# Patient Record
Sex: Female | Born: 1970 | Race: Black or African American | Hispanic: No | Marital: Married | Smoking: Never smoker
Health system: Southern US, Community
[De-identification: ages and names within clinical notes are randomized; demographics above are authoritative.]

## PROBLEM LIST (undated history)

## (undated) DIAGNOSIS — F329 Major depressive disorder, single episode, unspecified: Secondary | ICD-10-CM

## (undated) DIAGNOSIS — R7303 Prediabetes: Secondary | ICD-10-CM

## (undated) DIAGNOSIS — Z9221 Personal history of antineoplastic chemotherapy: Secondary | ICD-10-CM

## (undated) DIAGNOSIS — Z87442 Personal history of urinary calculi: Secondary | ICD-10-CM

## (undated) DIAGNOSIS — F32A Depression, unspecified: Secondary | ICD-10-CM

## (undated) DIAGNOSIS — C50919 Malignant neoplasm of unspecified site of unspecified female breast: Secondary | ICD-10-CM

## (undated) DIAGNOSIS — K760 Fatty (change of) liver, not elsewhere classified: Secondary | ICD-10-CM

## (undated) DIAGNOSIS — Z973 Presence of spectacles and contact lenses: Secondary | ICD-10-CM

## (undated) DIAGNOSIS — D649 Anemia, unspecified: Secondary | ICD-10-CM

## (undated) DIAGNOSIS — K219 Gastro-esophageal reflux disease without esophagitis: Secondary | ICD-10-CM

## (undated) DIAGNOSIS — I10 Essential (primary) hypertension: Secondary | ICD-10-CM

## (undated) DIAGNOSIS — Z923 Personal history of irradiation: Secondary | ICD-10-CM

## (undated) DIAGNOSIS — E7800 Pure hypercholesterolemia, unspecified: Secondary | ICD-10-CM

## (undated) DIAGNOSIS — F419 Anxiety disorder, unspecified: Secondary | ICD-10-CM

## (undated) DIAGNOSIS — G620 Drug-induced polyneuropathy: Secondary | ICD-10-CM

## (undated) DIAGNOSIS — N200 Calculus of kidney: Secondary | ICD-10-CM

## (undated) HISTORY — DX: Malignant neoplasm of unspecified site of unspecified female breast: C50.919

## (undated) HISTORY — DX: Gastro-esophageal reflux disease without esophagitis: K21.9

## (undated) HISTORY — DX: Prediabetes: R73.03

## (undated) HISTORY — PX: OOPHORECTOMY: SHX86

## (undated) HISTORY — DX: Pure hypercholesterolemia, unspecified: E78.00

## (undated) HISTORY — DX: Fatty (change of) liver, not elsewhere classified: K76.0

## (undated) HISTORY — PX: WRIST SURGERY: SHX841

## (undated) HISTORY — DX: Essential (primary) hypertension: I10

---

## 1997-07-27 HISTORY — PX: TUBAL LIGATION: SHX77

## 1997-11-14 ENCOUNTER — Inpatient Hospital Stay (HOSPITAL_COMMUNITY): Admission: AD | Admit: 1997-11-14 | Discharge: 1997-11-17 | Payer: Self-pay | Admitting: Obstetrics and Gynecology

## 1998-10-31 ENCOUNTER — Emergency Department (HOSPITAL_COMMUNITY): Admission: EM | Admit: 1998-10-31 | Discharge: 1998-10-31 | Payer: Self-pay | Admitting: Emergency Medicine

## 2000-07-27 HISTORY — PX: CHOLECYSTECTOMY: SHX55

## 2000-09-14 ENCOUNTER — Other Ambulatory Visit: Admission: RE | Admit: 2000-09-14 | Discharge: 2000-09-14 | Payer: Self-pay | Admitting: Internal Medicine

## 2002-03-10 ENCOUNTER — Ambulatory Visit (HOSPITAL_COMMUNITY): Admission: RE | Admit: 2002-03-10 | Discharge: 2002-03-10 | Payer: Self-pay | Admitting: Internal Medicine

## 2002-03-10 ENCOUNTER — Encounter: Payer: Self-pay | Admitting: Internal Medicine

## 2002-03-16 ENCOUNTER — Ambulatory Visit (HOSPITAL_COMMUNITY): Admission: RE | Admit: 2002-03-16 | Discharge: 2002-03-16 | Payer: Self-pay | Admitting: Internal Medicine

## 2002-03-16 ENCOUNTER — Encounter: Payer: Self-pay | Admitting: Internal Medicine

## 2002-03-21 ENCOUNTER — Encounter: Payer: Self-pay | Admitting: Internal Medicine

## 2002-03-21 ENCOUNTER — Ambulatory Visit (HOSPITAL_COMMUNITY): Admission: RE | Admit: 2002-03-21 | Discharge: 2002-03-21 | Payer: Self-pay | Admitting: Internal Medicine

## 2002-03-21 ENCOUNTER — Emergency Department (HOSPITAL_COMMUNITY): Admission: EM | Admit: 2002-03-21 | Discharge: 2002-03-21 | Payer: Self-pay | Admitting: Emergency Medicine

## 2002-06-23 ENCOUNTER — Encounter (HOSPITAL_BASED_OUTPATIENT_CLINIC_OR_DEPARTMENT_OTHER): Payer: Self-pay | Admitting: General Surgery

## 2002-06-27 ENCOUNTER — Encounter (INDEPENDENT_AMBULATORY_CARE_PROVIDER_SITE_OTHER): Payer: Self-pay | Admitting: *Deleted

## 2002-06-27 ENCOUNTER — Ambulatory Visit (HOSPITAL_COMMUNITY): Admission: RE | Admit: 2002-06-27 | Discharge: 2002-06-28 | Payer: Self-pay | Admitting: General Surgery

## 2002-06-27 ENCOUNTER — Encounter (HOSPITAL_BASED_OUTPATIENT_CLINIC_OR_DEPARTMENT_OTHER): Payer: Self-pay | Admitting: General Surgery

## 2002-06-27 HISTORY — PX: CHOLECYSTECTOMY, LAPAROSCOPIC: SHX56

## 2003-09-03 ENCOUNTER — Emergency Department (HOSPITAL_COMMUNITY): Admission: EM | Admit: 2003-09-03 | Discharge: 2003-09-03 | Payer: Self-pay | Admitting: Family Medicine

## 2004-09-24 ENCOUNTER — Emergency Department (HOSPITAL_COMMUNITY): Admission: EM | Admit: 2004-09-24 | Discharge: 2004-09-24 | Payer: Self-pay | Admitting: Family Medicine

## 2004-10-29 ENCOUNTER — Emergency Department (HOSPITAL_COMMUNITY): Admission: EM | Admit: 2004-10-29 | Discharge: 2004-10-29 | Payer: Self-pay | Admitting: Family Medicine

## 2006-01-21 ENCOUNTER — Emergency Department (HOSPITAL_COMMUNITY): Admission: EM | Admit: 2006-01-21 | Discharge: 2006-01-21 | Payer: Self-pay | Admitting: Family Medicine

## 2006-06-04 ENCOUNTER — Emergency Department (HOSPITAL_COMMUNITY): Admission: EM | Admit: 2006-06-04 | Discharge: 2006-06-04 | Payer: Self-pay | Admitting: Emergency Medicine

## 2007-09-13 ENCOUNTER — Emergency Department (HOSPITAL_COMMUNITY): Admission: EM | Admit: 2007-09-13 | Discharge: 2007-09-13 | Payer: Self-pay | Admitting: Emergency Medicine

## 2007-11-21 ENCOUNTER — Encounter (HOSPITAL_COMMUNITY): Payer: Self-pay | Admitting: Obstetrics and Gynecology

## 2007-11-21 ENCOUNTER — Ambulatory Visit (HOSPITAL_COMMUNITY): Admission: RE | Admit: 2007-11-21 | Discharge: 2007-11-21 | Payer: Self-pay | Admitting: Obstetrics and Gynecology

## 2007-11-21 HISTORY — PX: HYSTEROSCOPY WITH D & C: SHX1775

## 2008-08-13 ENCOUNTER — Inpatient Hospital Stay (HOSPITAL_COMMUNITY): Admission: EM | Admit: 2008-08-13 | Discharge: 2008-08-14 | Payer: Self-pay | Admitting: Emergency Medicine

## 2008-08-17 ENCOUNTER — Encounter (INDEPENDENT_AMBULATORY_CARE_PROVIDER_SITE_OTHER): Payer: Self-pay | Admitting: Orthopedic Surgery

## 2008-08-17 ENCOUNTER — Ambulatory Visit (HOSPITAL_BASED_OUTPATIENT_CLINIC_OR_DEPARTMENT_OTHER): Admission: RE | Admit: 2008-08-17 | Discharge: 2008-08-17 | Payer: Self-pay | Admitting: Orthopedic Surgery

## 2008-08-17 HISTORY — PX: CARPAL TUNNEL RELEASE: SHX101

## 2010-11-10 LAB — POCT I-STAT, CHEM 8
BUN: 16 mg/dL (ref 6–23)
Calcium, Ion: 1.13 mmol/L (ref 1.12–1.32)
Chloride: 109 mEq/L (ref 96–112)
Creatinine, Ser: 0.7 mg/dL (ref 0.4–1.2)
Glucose, Bld: 150 mg/dL — ABNORMAL HIGH (ref 70–99)
HCT: 41 % (ref 36.0–46.0)
Potassium: 3.5 mEq/L (ref 3.5–5.1)

## 2010-11-10 LAB — URINALYSIS, ROUTINE W REFLEX MICROSCOPIC
Glucose, UA: NEGATIVE mg/dL
Hgb urine dipstick: NEGATIVE
Ketones, ur: NEGATIVE mg/dL
Protein, ur: NEGATIVE mg/dL
Urobilinogen, UA: 0.2 mg/dL (ref 0.0–1.0)

## 2010-11-10 LAB — COMPREHENSIVE METABOLIC PANEL
ALT: 16 U/L (ref 0–35)
Alkaline Phosphatase: 49 U/L (ref 39–117)
BUN: 14 mg/dL (ref 6–23)
CO2: 19 mEq/L (ref 19–32)
Chloride: 109 mEq/L (ref 96–112)
Glucose, Bld: 144 mg/dL — ABNORMAL HIGH (ref 70–99)
Potassium: 3.5 mEq/L (ref 3.5–5.1)
Sodium: 139 mEq/L (ref 135–145)
Total Bilirubin: 0.8 mg/dL (ref 0.3–1.2)
Total Protein: 6.9 g/dL (ref 6.0–8.3)

## 2010-12-09 NOTE — Op Note (Signed)
NAMEWILMER, Joanna Osborn                  ACCOUNT NO.:  [id number]   MEDICAL RECORD NO.:  [id number]          PATIENT TYPE:  AMB   LOCATION:  SDC                           FACILITY:  [facility]   PHYSICIAN:  Zelphia Cairo, MD    DATE OF BIRTH:  [birth year]   DATE OF PROCEDURE:  11/21/2007  DATE OF DISCHARGE:                               OPERATIVE REPORT   PREOPERATIVE DIAGNOSES:  1. Menorrhagia  2. Endometrial mass.   POSTOPERATIVE DIAGNOSES:  1. Menorrhagia  2. Endometrial mass, suspect endometrial polyp.   PROCEDURES:  1. Hysteroscopy.  2. Dilation and curettage.  3. Polypectomy.  4. Mirena intrauterine device insertion.   SURGEON:  Zelphia Cairo, MD.   ANESTHESIA:  General.   SPECIMEN:  Endometrial curettings and endometrial polyp to pathology.   ESTIMATED BLOOD LOSS:  Minimal.   COMPLICATIONS:  None.   CONDITION:  Stable and extubated to recovery room.   PROCEDURE:  Joanna Osborn was taken to the operating room and placed in the  supine position.  General anesthesia was easily obtained.  She was then  placed in the dorsal lithotomy position and prepped and draped in  sterile fashion.  An in-and-out catheter was used to drain her bladder  for approximately 20 mL of clear urine.  Bivalve speculum was then  placed in the vagina and a single-tooth tenaculum placed on the anterior  lip of the cervix.  The cervix was dilated using Pratt dilators.  The  uterus sounded to 7.5 cm.  The diagnostic hysteroscope was inserted into  the uterine cavity and a survey was performed.  Endometrial polyp was  noted on the anterior right aspect of the uterine wall.  The remaining  endometrial cavity appeared normal visually.  The hysteroscope was then  removed from the cavity.  Polyp forceps were introduced and the  endometrial polyp was grasped and easily removed.  A gentle curetting  was then performed using a curette.  Hysteroscope was reintroduced to  ensure that the endometrial mass was removed.   The cavity appeared  without polyps or masses at this point.  Hysteroscope was removed and  the Mirena IUD was  inserted using standard manufacture guidelines. Strings were cut  approximately 2 cm from the cervical os.  The speculum and single-tooth  tenaculum were then removed.  The patient was extubated and taken to the  recovery room in stable condition.  Sponge, lap, needle and instrument  counts were correct x2.      Zelphia Cairo, MD  Electronically Signed     GA/MEDQ  D:  11/21/2007  T:  11/22/2007  Job:  [id number]

## 2010-12-09 NOTE — Op Note (Signed)
Joanna Osborn, Joanna Osborn                  ACCOUNT NO.:  [id number]   MEDICAL RECORD NO.:  [id number]          PATIENT TYPE:  AMB   LOCATION:  DSC                          FACILITY:  [facility]   PHYSICIAN:  Cindee Salt, M.D.       DATE OF BIRTH:  [birth year]   DATE OF PROCEDURE:  08/17/2008  DATE OF DISCHARGE:                               OPERATIVE REPORT   PREOPERATIVE DIAGNOSIS:  Carpal tunnel syndrome with cyst in the carpal  canal.   POSTOPERATIVE DIAGNOSIS:  Carpal tunnel syndrome with cyst in the carpal  canal.   OPERATION:  Excision of volar radial wrist ganglion and release of right  carpal tunnel.   SURGEON:  Cindee Salt, MD   ASSISTANT:  Carolyne Fiscal, RN   ANESTHESIA:  General.   DATE OF OPERATION:  August 17, 2008.   ANESTHESIOLOGIST:  Zenon Mayo, MD   HISTORY:  The patient is a 40 year old female with a history of a mass  on the volar aspect of her hand and carpal tunnel syndrome.  MRI reveals  a cyst in the carpal canal.  This has not responded to conservative  treatment.  She has elected to proceed to have this surgically released  and excised.  She is aware of risks and complications including  infection, recurrence, injury to arteries, nerves, and tendons,  incomplete relief of symptoms, and dystrophy.  In the preoperative area,  the patient is seen.  The extremity marked by both the patient and  surgeon.  Antibiotic given.   PROCEDURE:  The patient was brought to the operating room where a  general anesthetic was carried out without difficulty under the  direction of Dr. Sampson Goon.  She was prepped using DuraPrep, supine  position with the right arm free.  The limb was exsanguinated after time-  out was taken confirming the patient and procedure.  Tourniquet was  placed high on the arm and was inflated to 250 mmHg after exsanguination  with an Esmarch bandage.  A longitudinal incision was made in the palm  and carried down through subcutaneous tissue.  Bleeders  were  electrocauterized.  Palmar fascia was split.  Superficial palmar arch  identified.  Flexor tendon to the ring and little finger identified to  the ulnar side of median nerve.  Carpal retinaculum was incised with  sharp dissection.  A right-angle and Sewall retractor were placed  between skin and forearm fascia.  The visualization of the canal was  less than perfect.  The cyst was not identified.  The wound was then  extended proximally, first to the ulnar side and then onto the distal  forearm and carried down through subcutaneous tissue.  The carpal canal  was released.  Area of compression of the nerve was apparent.  Cyst was  not immediately identified.  This was found superficial to the nerve  inside the carpal retinaculum and then palmarly.  With blunt and sharp  dissection, this was dissected free and followed down to the STT joint.  Area was debrided.  The wound was copiously irrigated with saline.  The  specimen was sent to pathology.  No further lesions were identified.  The wound was then closed with interrupted 5-0 Vicryl  Rapide sutures.  A sterile compressive dressing and splint to the wrist  was applied.  The patient tolerated the procedure well and was taken to  the recovery room for observation in satisfactory condition.  She will  be discharged home to return to [facility] in 1 week on  Vicodin.           ______________________________  Cindee Salt, M.D.     GK/MEDQ  D:  08/17/2008  T:  08/18/2008  Job:  [id number]   cc:   Tanya D. Daphine Deutscher, M.D.

## 2010-12-12 NOTE — Op Note (Signed)
NAME:  Joanna Osborn, Joanna Osborn NO.:  [id number]   MEDICAL RECORD NO.:  [id number]                   PATIENT TYPE:  OIB   LOCATION:  2550                                 FACILITY:  [facility]   PHYSICIAN:  Leonie Man, M.D.                DATE OF BIRTH:  May 02, 1971   DATE OF PROCEDURE:  06/27/2002  DATE OF DISCHARGE:                                 OPERATIVE REPORT   PREOPERATIVE DIAGNOSIS:  Chronic cholecystitis.   POSTOPERATIVE DIAGNOSIS:  Chronic cholecystitis.   PROCEDURE:  Laparoscopic cholecystectomy with intraoperative cholangiogram.   SURGEON:  Leonie Man, M.D.   ASSISTANT:  Joanne Gavel, M.D.   ANESTHESIA:  General.   CLINICAL NOTE:  The patient is a 40 year old woman with recurrent upper  abdominal pain, associated nausea and vomiting, who presents now for  laparoscopic cholecystectomy after abdominal ultrasound has demonstrated  cholelithiasis.  She comes to the operating room after the risks and  benefits of surgery have been fully discussed.  She also had a HIDA scan,  which showed her gallbladder function to be normal with an ejection fraction  of 69% at 30 minutes.  She was referred for cholecystectomy because of her  persistent classic symptoms of chronic cholecystitis.   DESCRIPTION OF PROCEDURE:  Following the induction of satisfactory general  anesthesia with the patient positioned supinely, the abdomen is prepped and  draped routinely.  Open laparoscopy created at the umbilicus with  insufflation of the peritoneal cavity to 14 mmHg pressure, insertion of a  camera, and with visual exploration carried out.  The visualization showed a  great deal of scarring around the region of the gallbladder.  Liver edges  were sharp and liver surfaces smooth.  Anterior gastric wall appeared  normal.  The uterus was enlarged to approximately two months' size.  Under  direct vision epigastric and lateral ports were placed.  The gallbladder was  then  grasped and dissection carried down, removing multiple adhesions from  the gallbladder, carrying the dissection down to a very long cystic duct.  This was cleared.  The cystic duct cholangiogram was then carried out  through this cystic duct, showing free flow of contrast into the duodenum  with no filling defects in any portion of the intrahepatic or extrahepatic  biliary system visualized.  Angiocath was removed and the cystic duct was  triply clipped and transected.  The cystic artery was also doubly clipped  and transected.  Then the gallbladder was dissected free from the liver bed,  maintaining hemostasis throughout the course of the dissection.  Following  removal of the gallbladder, the liver bed was again inspected for bleeding.  All additional bleeding points treated with electrocautery and the right  upper quadrant thoroughly irrigated with normal saline.  The camera was then  moved to the epigastric port.  The gallbladder was retrieved through the  umbilical port without difficulty.  The pneumoperitoneum was deflated.  Sponge, instrument, and sharp counts were verified, and the wound was closed  in layers, the umbilical wound being  closed in two layers with 0 Dexon and 4-0 Vicryl, epigastric and lateral  flank wounds closed with 4-0 Vicryl sutures, all wounds reinforced with  Steri-Strips, and a sterile dressing was applied.  Anesthetic reversed.  The  patient removed from the operating room to the recovery room in stable  condition.  She tolerated the procedure well.                                               Leonie Man, M.D.    PB/MEDQ  D:  06/27/2002  T:  06/27/2002  Job:  [id number]

## 2011-04-21 LAB — CBC
Hemoglobin: 12.3
MCHC: 33.6
MCV: 86.2
RBC: 4.26
RDW: 13.4

## 2011-12-14 ENCOUNTER — Encounter (HOSPITAL_COMMUNITY): Payer: Self-pay | Admitting: Cardiology

## 2011-12-14 ENCOUNTER — Emergency Department (HOSPITAL_COMMUNITY)
Admission: EM | Admit: 2011-12-14 | Discharge: 2011-12-14 | Disposition: A | Discharge status: Home / Self Care | Payer: Federal, State, Local not specified - PPO | Source: Home / Self Care | Attending: Emergency Medicine | Admitting: Emergency Medicine

## 2011-12-14 DIAGNOSIS — J020 Streptococcal pharyngitis: Secondary | ICD-10-CM

## 2011-12-14 MED ORDER — AMOXICILLIN 500 MG PO CAPS: 500.0000 mg | ORAL_CAPSULE | Freq: Three times a day (TID) | ORAL | Status: AC

## 2011-12-14 MED ORDER — LORAZEPAM 2 MG/ML IJ SOLN
INTRAMUSCULAR | Status: AC
  Filled 2011-12-14: qty 1

## 2011-12-14 MED ORDER — ACETAMINOPHEN-CODEINE #3 300-30 MG PO TABS: 1.0000 | ORAL_TABLET | Freq: Four times a day (QID) | ORAL | Status: AC | PRN

## 2011-12-14 NOTE — ED Notes (Signed)
Pt reports left sided sore throat, nasal congestion, facial pain, and bilat ear pain that started yesterday. Denies fever. Decreased PO intake today due to pain on swallowing.

## 2011-12-14 NOTE — ED Provider Notes (Signed)
History     CSN: [id number]  Arrival date & time 12/14/11  0911   First MD Initiated Contact with Patient 12/14/11 1017      Chief Complaint  Patient presents with  . Sore Throat  . Nasal Congestion  . Facial Pain  . Otalgia    (Consider location/radiation/quality/duration/timing/severity/associated sxs/prior treatment) HPI Comments: Patient woke up this morning with left-sided sore throat, and sinus congestion (patient points to both maxillary and frontal sinuses) "it really hurts when I swallow mainly on my left side and left side of my neck both ears are throbbing and hurting. Patient denies any cough, nasal congestion runny nose postnasal dripping or fevers.  Patient is a 41 y.o. female presenting with pharyngitis and ear pain.  Sore Throat The current episode started yesterday. The problem occurs constantly. The problem has been gradually worsening. Pertinent negatives include no shortness of breath. The symptoms are aggravated by swallowing. The symptoms are relieved by nothing. She has tried nothing for the symptoms.  Otalgia Associated symptoms include sore throat. Pertinent negatives include no rhinorrhea and no cough.    History reviewed. No pertinent past medical history.  Past Surgical History  Procedure Date  . Tubal ligation 1999  . Cholecystectomy 2002    No family history on file.  History  Substance Use Topics  . Smoking status: Never Smoker   . Smokeless tobacco: Not on file  . Alcohol Use: Yes     occas    OB History    Grav Para Term Preterm Abortions TAB SAB Ect Mult Living                  Review of Systems  Constitutional: Positive for appetite change. Negative for fever, chills and fatigue.  HENT: Positive for ear pain, sore throat and sinus pressure. Negative for congestion, rhinorrhea, trouble swallowing and postnasal drip.   Respiratory: Negative for cough and shortness of breath.     Allergies  Review of patient's allergies  indicates no known allergies.  Home Medications  No current outpatient prescriptions on file.  BP 137/89  Pulse 104  Temp(Src) 99.8 F (37.7 C) (Oral)  Resp 18  SpO2 99%  LMP 11/14/2011  Physical Exam  Nursing note and vitals reviewed. Constitutional: She appears well-developed and well-nourished.  HENT:  Right Ear: Tympanic membrane normal.  Left Ear: Tympanic membrane normal.  Mouth/Throat: Uvula is midline and mucous membranes are normal. Posterior oropharyngeal erythema present. No oropharyngeal exudate.  Eyes: Conjunctivae are normal.  Neck: No JVD present. No tracheal deviation present.  Pulmonary/Chest: Effort normal and breath sounds normal. No respiratory distress.  Lymphadenopathy:    She has cervical adenopathy.  Skin: No rash noted.    ED Course  Procedures (including critical care time)  Labs Reviewed  POCT RAPID STREP A ([facility] URG CARE ONLY) - Abnormal; Notable for the following:    Streptococcus, Group A Screen (Direct) POSITIVE (*)    All other components within normal limits   No results found.   No diagnosis found.    MDM  Uncomplicated streptococcal pharyngitis        Jimmie Molly, MD 12/14/11 1058

## 2011-12-14 NOTE — Discharge Instructions (Signed)
You should feel much better and 48 hours. Try to stay well hydrated. If worsening symptoms despite antibiotics or unable to swallow fluids or excessive salivation should go to the emergency department.  Strep Throat Strep throat is an infection of the throat caused by a bacteria named Streptococcus pyogenes. Your caregiver may call the infection streptococcal "tonsillitis" or "pharyngitis" depending on whether there are signs of inflammation in the tonsils or back of the throat. Strep throat is most common in children from 82 to 41 years old during the cold months of the year, but it can occur in people of any age during any season. This infection is spread from person to person (contagious) through coughing, sneezing, or other close contact. SYMPTOMS   Fever or chills.   Painful, swollen, red tonsils or throat.   Pain or difficulty when swallowing.   White or yellow spots on the tonsils or throat.   Swollen, tender lymph nodes or "glands" of the neck or under the jaw.   Red rash all over the body (rare).  DIAGNOSIS  Many different infections can cause the same symptoms. A test must be done to confirm the diagnosis so the right treatment can be given. A "rapid strep test" can help your caregiver make the diagnosis in a few minutes. If this test is not available, a light swab of the infected area can be used for a throat culture test. If a throat culture test is done, results are usually available in a day or two. TREATMENT  Strep throat is treated with antibiotic medicine. HOME CARE INSTRUCTIONS   Gargle with 1 tsp of salt in 1 cup of warm water, 3 to 4 times per day or as needed for comfort.   Family members who also have a sore throat or fever should be tested for strep throat and treated with antibiotics if they have the strep infection.   Make sure everyone in your household washes their hands well.   Do not share food, drinking cups, or personal items that could cause the infection  to spread to others.   You may need to eat a soft food diet until your sore throat gets better.   Drink enough water and fluids to keep your urine clear or pale yellow. This will help prevent dehydration.   Get plenty of rest.   Stay home from school, daycare, or work until you have been on antibiotics for 24 hours.   Only take over-the-counter or prescription medicines for pain, discomfort, or fever as directed by your caregiver.   If antibiotics are prescribed, take them as directed. Finish them even if you start to feel better.  SEEK MEDICAL CARE IF:   The glands in your neck continue to enlarge.   You develop a rash, cough, or earache.   You cough up green, yellow-brown, or bloody sputum.   You have pain or discomfort not controlled by medicines.   Your problems seem to be getting worse rather than better.  SEEK IMMEDIATE MEDICAL CARE IF:   You develop any new symptoms such as vomiting, severe headache, stiff or painful neck, chest pain, shortness of breath, or trouble swallowing.   You develop severe throat pain, drooling, or changes in your voice.   You develop swelling of the neck, or the skin on the neck becomes red and tender.   You have a fever.   You develop signs of dehydration, such as fatigue, dry mouth, and decreased urination.   You become increasingly sleepy,  or you cannot wake up completely.  Document Released: 07/10/2000 Document Revised: 07/02/2011 Document Reviewed: 09/11/2010 [facility] Patient Information 2012 [place], [place].Strep Throat Strep throat is an infection of the throat caused by a bacteria named Streptococcus pyogenes. Your caregiver may call the infection streptococcal "tonsillitis" or "pharyngitis" depending on whether there are signs of inflammation in the tonsils or back of the throat. Strep throat is most common in children from 74 to 90 years old during the cold months of the year, but it can occur in people of any age during any season.  This infection is spread from person to person (contagious) through coughing, sneezing, or other close contact. SYMPTOMS   Fever or chills.   Painful, swollen, red tonsils or throat.   Pain or difficulty when swallowing.   White or yellow spots on the tonsils or throat.   Swollen, tender lymph nodes or "glands" of the neck or under the jaw.   Red rash all over the body (rare).  DIAGNOSIS  Many different infections can cause the same symptoms. A test must be done to confirm the diagnosis so the right treatment can be given. A "rapid strep test" can help your caregiver make the diagnosis in a few minutes. If this test is not available, a light swab of the infected area can be used for a throat culture test. If a throat culture test is done, results are usually available in a day or two. TREATMENT  Strep throat is treated with antibiotic medicine. HOME CARE INSTRUCTIONS   Gargle with 1 tsp of salt in 1 cup of warm water, 3 to 4 times per day or as needed for comfort.   Family members who also have a sore throat or fever should be tested for strep throat and treated with antibiotics if they have the strep infection.   Make sure everyone in your household washes their hands well.   Do not share food, drinking cups, or personal items that could cause the infection to spread to others.   You may need to eat a soft food diet until your sore throat gets better.   Drink enough water and fluids to keep your urine clear or pale yellow. This will help prevent dehydration.   Get plenty of rest.   Stay home from school, daycare, or work until you have been on antibiotics for 24 hours.   Only take over-the-counter or prescription medicines for pain, discomfort, or fever as directed by your caregiver.   If antibiotics are prescribed, take them as directed. Finish them even if you start to feel better.  SEEK MEDICAL CARE IF:   The glands in your neck continue to enlarge.   You develop a  rash, cough, or earache.   You cough up green, yellow-brown, or bloody sputum.   You have pain or discomfort not controlled by medicines.   Your problems seem to be getting worse rather than better.  SEEK IMMEDIATE MEDICAL CARE IF:   You develop any new symptoms such as vomiting, severe headache, stiff or painful neck, chest pain, shortness of breath, or trouble swallowing.   You develop severe throat pain, drooling, or changes in your voice.   You develop swelling of the neck, or the skin on the neck becomes red and tender.   You have a fever.   You develop signs of dehydration, such as fatigue, dry mouth, and decreased urination.   You become increasingly sleepy, or you cannot wake up completely.  Document Released: 07/10/2000 Document  Revised: 07/02/2011 Document Reviewed: 09/11/2010 [facility] Patient Information 2012 [place], [place].

## 2012-04-11 ENCOUNTER — Emergency Department (INDEPENDENT_AMBULATORY_CARE_PROVIDER_SITE_OTHER)
Admission: EM | Admit: 2012-04-11 | Discharge: 2012-04-11 | Disposition: A | Discharge status: Home / Self Care | Payer: Federal, State, Local not specified - PPO | Source: Home / Self Care | Attending: Family Medicine | Admitting: Family Medicine

## 2012-04-11 ENCOUNTER — Encounter (HOSPITAL_COMMUNITY): Payer: Self-pay

## 2012-04-11 DIAGNOSIS — J329 Chronic sinusitis, unspecified: Secondary | ICD-10-CM

## 2012-04-11 MED ORDER — AZITHROMYCIN 250 MG PO TABS: ORAL_TABLET | ORAL | Status: DC

## 2012-04-11 MED ORDER — SALINE NASAL SPRAY 0.65 % NA SOLN: 1.0000 | NASAL | Status: DC | PRN

## 2012-04-11 NOTE — ED Notes (Signed)
C/o pain , pressure in face, sinus pressure; yellow/green nasal secretions, hist of prior sinus issues

## 2012-04-11 NOTE — ED Provider Notes (Signed)
History     CSN: [id number]  Arrival date & time 04/11/12  1046   None     Chief Complaint  Patient presents with  . Facial Pain    (Consider location/radiation/quality/duration/timing/severity/associated sxs/prior treatment) The history is provided by the patient.   Joanna Osborn is a 41 y.o. female who complains of onset of facial pain 3 days ago not relieved with alka seltzer or sudafed.    No sore throat No cough, non productive No pleuritic pain No wheezing + nasal congestion + post-nasal drainage + sinus pain/pressure No voice changes No chest congestion No itchy/red eyes + earache No hemoptysis No SOB + chills/sweats No fever + nausea No vomiting No abdominal pain No diarrhea No skin rashes + fatigue No myalgias No headache  No ill contacts  History reviewed. No pertinent past medical history.  Past Surgical History  Procedure Date  . Tubal ligation 1999  . Cholecystectomy 2002    History reviewed. No pertinent family history.  History  Substance Use Topics  . Smoking status: Never Smoker   . Smokeless tobacco: Not on file  . Alcohol Use: Yes     occas    OB History    Grav Para Term Preterm Abortions TAB SAB Ect Mult Living                  Review of Systems  All other systems reviewed and are negative.    Allergies  Review of patient's allergies indicates no known allergies.  Home Medications   Current Outpatient Rx  Name Route Sig Dispense Refill  . AZITHROMYCIN 250 MG PO TABS  Azithromycin 500mg  on day 1, then 250mg  on days 2-4 6 tablet 0  . SALINE NASAL SPRAY 0.65 % NA SOLN Nasal Place 1 spray into the nose as needed for congestion. 30 mL 12    BP 150/107  Pulse 85  Temp 98.8 F (37.1 C) (Oral)  SpO2 97%  Physical Exam  Nursing note and vitals reviewed. Constitutional: She is oriented to person, place, and time. Vital signs are normal. She appears well-developed and well-nourished. She is active and cooperative.  HENT:    Head: Normocephalic.  Right Ear: Hearing, tympanic membrane, external ear and ear canal normal.  Left Ear: Hearing, tympanic membrane, external ear and ear canal normal.  Nose: Right sinus exhibits maxillary sinus tenderness. Right sinus exhibits no frontal sinus tenderness. Left sinus exhibits maxillary sinus tenderness. Left sinus exhibits no frontal sinus tenderness.  Mouth/Throat: Uvula is midline, oropharynx is clear and moist and mucous membranes are normal. No oropharyngeal exudate.  Eyes: Conjunctivae normal and EOM are normal. Pupils are equal, round, and reactive to light. No scleral icterus.  Neck: Trachea normal, normal range of motion and full passive range of motion without pain. Neck supple. Normal carotid pulses, no hepatojugular reflux and no JVD present. No spinous process tenderness and no muscular tenderness present.  Cardiovascular: Normal rate, regular rhythm, normal heart sounds, intact distal pulses and normal pulses.   No murmur heard. Pulmonary/Chest: Effort normal and breath sounds normal. She exhibits no tenderness.  Lymphadenopathy:       Head (right side): No submental, no submandibular, no tonsillar, no preauricular, no posterior auricular and no occipital adenopathy present.       Head (left side): No submental, no submandibular, no tonsillar, no preauricular, no posterior auricular and no occipital adenopathy present.    She has no cervical adenopathy.  Neurological: She is alert and oriented to person, place,  and time. No cranial nerve deficit or sensory deficit.  Skin: Skin is warm and dry.  Psychiatric: She has a normal mood and affect. Her speech is normal and behavior is normal. Judgment and thought content normal. Cognition and memory are normal.    ED Course  Procedures (including critical care time)  Labs Reviewed - No data to display No results found.   1. Sinusitis       MDM  Stop taking Sudafed, follow up with Dr. Alwyn Pea this week  for recheck and further evaluation of your blood pressure-absence of neurological or cardiovascular symptoms.  Increase fluid intake, rest.  Begin Azithromycin.  Begin saline nasal spray.  Antihistamines of your choice (Claritin or Zyrtec).  Tylenol or Motrin for fever/discomfort.  Followup with PCP if not improving 7 to 10 days.         Johnsie Kindred, NP 04/11/12 1424

## 2012-04-22 NOTE — ED Provider Notes (Signed)
Medical screening examination/treatment/procedure(s) were performed by resident physician or non-physician practitioner and as supervising physician I was immediately available for consultation/collaboration.   Barkley Bruns MD.    Linna Hoff, MD 04/22/12 [phone]

## 2012-10-06 ENCOUNTER — Encounter (HOSPITAL_COMMUNITY): Payer: Self-pay | Admitting: Emergency Medicine

## 2012-10-06 ENCOUNTER — Emergency Department (INDEPENDENT_AMBULATORY_CARE_PROVIDER_SITE_OTHER): Payer: Federal, State, Local not specified - PPO

## 2012-10-06 ENCOUNTER — Emergency Department (HOSPITAL_COMMUNITY)
Admission: EM | Admit: 2012-10-06 | Discharge: 2012-10-06 | Disposition: A | Discharge status: Home / Self Care | Payer: Federal, State, Local not specified - PPO | Source: Home / Self Care | Attending: Emergency Medicine | Admitting: Emergency Medicine

## 2012-10-06 DIAGNOSIS — N200 Calculus of kidney: Secondary | ICD-10-CM

## 2012-10-06 HISTORY — DX: Calculus of kidney: N20.0

## 2012-10-06 MED ORDER — HYDROMORPHONE HCL PF 1 MG/ML IJ SOLN
1.0000 mg | Freq: Once | INTRAMUSCULAR | Status: AC
Start: 2012-10-06 — End: 2012-10-06
  Administered 2012-10-06: 1 mg via INTRAMUSCULAR

## 2012-10-06 MED ORDER — ONDANSETRON HCL 4 MG/2ML IJ SOLN
4.0000 mg | Freq: Once | INTRAMUSCULAR | Status: AC
  Administered 2012-10-06: 4 mg via INTRAMUSCULAR

## 2012-10-06 MED ORDER — ONDANSETRON HCL 4 MG/2ML IJ SOLN
INTRAMUSCULAR | Status: AC
  Filled 2012-10-06: qty 2

## 2012-10-06 MED ORDER — CEPHALEXIN 500 MG PO CAPS: 500.0000 mg | ORAL_CAPSULE | Freq: Three times a day (TID) | ORAL | Status: DC

## 2012-10-06 MED ORDER — KETOROLAC TROMETHAMINE 60 MG/2ML IM SOLN
INTRAMUSCULAR | Status: AC
  Filled 2012-10-06: qty 2

## 2012-10-06 MED ORDER — HYDROMORPHONE HCL PF 1 MG/ML IJ SOLN
INTRAMUSCULAR | Status: AC
  Filled 2012-10-06: qty 1

## 2012-10-06 MED ORDER — OXYCODONE-ACETAMINOPHEN 5-325 MG PO TABS: ORAL_TABLET | ORAL | Status: DC

## 2012-10-06 MED ORDER — TAMSULOSIN HCL 0.4 MG PO CAPS: 0.4000 mg | ORAL_CAPSULE | Freq: Every day | ORAL | Status: DC

## 2012-10-06 MED ORDER — KETOROLAC TROMETHAMINE 60 MG/2ML IM SOLN
60.0000 mg | Freq: Once | INTRAMUSCULAR | Status: AC
  Administered 2012-10-06: 60 mg via INTRAMUSCULAR

## 2012-10-06 NOTE — ED Notes (Signed)
Reports left flank pain started yesterday, intermittent.  Today pain is worse, urine has odor, no pain with urination

## 2012-10-06 NOTE — ED Notes (Signed)
Provided dr Lorenz Coaster with lab results on urine.

## 2012-10-06 NOTE — ED Provider Notes (Signed)
Chief Complaint:   Chief Complaint  Patient presents with  . Flank Pain    History of Present Illness:   Joanna Osborn is a 42 year old female who has a two-day history of intermittent left flank pain radiating around towards the left groin. This is a sharp pain rated 4/10 in intensity. It began rather suddenly yesterday, lasted several hours then went away completely. This morning it came back again. She's not had any fever or chills but has felt nauseated. No vomiting, diarrhea, or hematochezia. She denies any urinary symptoms such as dysuria, frequency, urgency, or hematuria. She's had no GYN symptoms. She does have a history of kidney stones on the right side in the past one occurring 4 years ago another about 5 years ago. Both of these were passed spontaneously.  Review of Systems:  Other than noted above, the patient denies any of the following symptoms: General:  No fevers, chills, sweats, aches, or fatigue. GI:  No abdominal pain, back pain, nausea, vomiting, diarrhea, or constipation. GU:  No dysuria, frequency, urgency, hematuria, or incontinence. GYN:  No discharge, itching, vulvar pain or lesions, pelvic pain, or abnormal vaginal bleeding.  PMFSH:  Past medical history, family history, social history, meds, and allergies were reviewed.  Physical Exam:   Vital signs:  BP 126/89  Pulse 81  Temp(Src) 98.2 F (36.8 C) (Oral)  Resp 16  SpO2 100%  LMP 09/22/2012 Gen:  Alert, oriented, in no distress. Lungs:  Clear to auscultation, no wheezes, rales or rhonchi. Heart:  Regular rhythm, no gallop or murmer. Abdomen:  Flat and soft. There was no abdominal pain to palpation.  No guarding, or rebound.  No hepato-splenomegaly or mass.  Bowel sounds were normally active.  No hernia. Back:  No CVA tenderness.  Skin:  Clear, warm and dry.  Other Labs Obtained at Urgent Care Center:  A urinalysis was performed which showed negative glucose, negative bilirubin, trace ketone, specific gravity  1.025, moderate occult blood, pH 5.5, trace protein, negative nitrite, and trace leukocyte. A urine culture was obtained.  Results are pending at this time and we will call about any positive results.  Dg Abd 1 View  10/06/2012  *RADIOLOGY REPORT*  Clinical Data: Left lower quadrant abdominal pain.  Prior history of urinary tract calculi.  ABDOMEN - 1 VIEW  Comparison: CT abdomen and pelvis 08/13/2008.  Findings: Bowel gas pattern unremarkable without evidence of obstruction or significant ileus.  Prominent Reidel lobe of liver, an anatomic variant.  Surgical clips in the right upper quadrant from prior cholecystectomy.  Intrauterine device.  The tiny calculus in the upper pole of the left kidney identified on the prior CT is not visible on the x-ray.  Small phlebolith in the right side of the pelvis.  No visible right-sided opaque urinary tract calculi.  Regional skeleton unremarkable.  IMPRESSION: No acute abdominal abnormality.  No visible urinary tract calculi.   Original Report Authenticated By: Hulan Saas, M.D.    Course in Urgent Care Center:   Given Toradol 60 mg IM, Dilaudid 1 mg IM, and Zofran 4 mg IM.  Assessment: The encounter diagnosis was Kidney stone.   The history is most compatible with a kidney stone and she did have moderate occult blood in her urine. The x-ray didn't show any radiopaque stones, but will treat as for ureteral stone. I suggested followup with a urologist, Dr. Vernie Ammons, if no better by next week.  Plan:   1.  The following meds were prescribed:  Discharge Medication List as of 10/06/2012  2:46 PM    START taking these medications   Details  cephALEXin (KEFLEX) 500 MG capsule Take 1 capsule (500 mg total) by mouth 3 (three) times daily., Starting 10/06/2012, Until Discontinued, Normal    oxyCODONE-acetaminophen (PERCOCET) 5-325 MG per tablet 1 to 2 tablets every 6 hours as needed for pain., Print    tamsulosin (FLOMAX) 0.4 MG CAPS Take 1 capsule (0.4 mg total)  by mouth daily., Starting 10/06/2012, Until Discontinued, Normal       2.  The patient was instructed in symptomatic care and handouts were given. 3.  The patient was told to return if becoming worse in any way, if no better in 3 or 4 days, and given some red flag symptoms such as fever, vomiting, severe pain, or gross blood in urine that would indicate earlier return. 4.  The patient was told to avoid intercourse for 10 days, get extra fluids, and return for a follow up with her primary care doctor at the completion of treatment for a repeat UA and culture.     Reuben Likes, MD 10/06/12 [phone]

## 2012-10-09 LAB — URINE CULTURE: Colony Count: >=100000

## 2012-10-09 NOTE — ED Notes (Signed)
Urine culture: >100,000 colonies Citrobacter Koseri.  Pt. adequately treated with Keflex. Vassie Moselle 10/09/2012

## 2013-05-04 ENCOUNTER — Encounter (HOSPITAL_COMMUNITY): Payer: Self-pay | Admitting: Emergency Medicine

## 2013-05-04 ENCOUNTER — Emergency Department (HOSPITAL_COMMUNITY)
Admission: EM | Admit: 2013-05-04 | Discharge: 2013-05-05 | Disposition: A | Discharge status: Home / Self Care | Payer: Federal, State, Local not specified - PPO | Attending: Emergency Medicine | Admitting: Emergency Medicine

## 2013-05-04 DIAGNOSIS — Z87442 Personal history of urinary calculi: Secondary | ICD-10-CM | POA: Insufficient documentation

## 2013-05-04 DIAGNOSIS — J329 Chronic sinusitis, unspecified: Secondary | ICD-10-CM

## 2013-05-04 MED ORDER — KETOROLAC TROMETHAMINE 30 MG/ML IJ SOLN
30.0000 mg | Freq: Once | INTRAMUSCULAR | Status: AC
  Administered 2013-05-05: 30 mg via INTRAMUSCULAR
  Filled 2013-05-04: qty 1

## 2013-05-04 MED ORDER — PSEUDOEPHEDRINE HCL ER 120 MG PO TB12
120.0000 mg | ORAL_TABLET | Freq: Two times a day (BID) | ORAL | Status: DC
  Administered 2013-05-05: 120 mg via ORAL
  Filled 2013-05-04: qty 1

## 2013-05-04 MED ORDER — PSEUDOEPHEDRINE HCL ER 120 MG PO TB12: 120.0000 mg | ORAL_TABLET | Freq: Two times a day (BID) | ORAL | Status: DC

## 2013-05-04 NOTE — ED Notes (Signed)
Pt states she has a headache and has had it for going on week #3  Pt states she has been taking OTC medication without relief  Pt states sometimes the pain is so bad she feels like she is going to vomit but has not thus far  Pt states the pain is in her forehead area and to the very back of her head

## 2013-05-04 NOTE — ED Provider Notes (Signed)
CSN: [id number]     Arrival date & time 05/04/13  2241 History  This chart was scribed for non-physician practitioner, Earley Favor, working with Olivia Mackie, MD by Clydene Laming, ED Scribe. This patient was seen in room WTR6/WTR6 and the patient's care was started at 11:26 PM.    Chief Complaint  Patient presents with  . Headache    The history is provided by the patient. No language interpreter was used.   HPI Comments: Joanna Osborn is a 42 y.o. female who presents to the Emergency Department complaining of a waxing and waning headache onset three weeks ago with associated sinus pressure. Pt is experiencing increased pressure in the face when bending over. Pt is taking Excedrin because she thought it was streess, then began to take [employer]. She couldn't be seen by pcp because of her schedule.She has a hx of sinus infection. Pt denies fever, cough, and SOB. She had a sinus infection this time last year.    Past Medical History  Diagnosis Date  . Kidney stone    Past Surgical History  Procedure Laterality Date  . Tubal ligation  1999  . Cholecystectomy  2002  . Wrist surgery     Family History  Problem Relation Age of Onset  . Hypertension Other   . Cancer Other    History  Substance Use Topics  . Smoking status: Never Smoker   . Smokeless tobacco: Not on file  . Alcohol Use: Yes     Comment: occas   OB History   Grav Para Term Preterm Abortions TAB SAB Ect Mult Living                 Review of Systems  Constitutional: Negative for fever.  HENT: Positive for sinus pressure.   Neurological: Positive for headaches.  All other systems reviewed and are negative.    Allergies  Review of patient's allergies indicates no known allergies.  Home Medications   Current Outpatient Rx  Name  Route  Sig  Dispense  Refill  . aspirin-acetaminophen-caffeine (EXCEDRIN MIGRAINE) 250-250-65 MG per tablet   Oral   Take 1 tablet by mouth every 6 (six) hours as needed for pain.         Marland Kitchen ibuprofen (ADVIL,MOTRIN) 200 MG tablet   Oral   Take 600 mg by mouth every 6 (six) hours as needed for pain.         . pseudoephedrine (SUDAFED) 120 MG 12 hr tablet   Oral   Take 1 tablet (120 mg total) by mouth every 12 (twelve) hours.   20 tablet   0    Triage Vitals:BP 131/88  Pulse 97  Temp(Src) 98.4 F (36.9 C) (Oral)  Resp 14  SpO2 97%  LMP 04/23/2013 Physical Exam  Eyes: Pupils are equal, round, and reactive to light.  Cardiovascular: Normal rate, regular rhythm and normal heart sounds.   No tachycardia  Pulmonary/Chest:  Lungs normal    ED Course  Procedures (including critical care time) DIAGNOSTIC STUDIES: Oxygen Saturation is 97% on RA, normal by my interpretation.    COORDINATION OF CARE:  11:33 PM- Discussed treatment plan with pt at bedside. Pt verbalized understanding and agreement with plan.   Labs Review Labs Reviewed - No data to display Imaging Review No results found.  EKG Interpretation   None       MDM   1. Sinusitis     I personally performed the services described in this documentation, which  was scribed in my presence. The recorded information has been reviewed and is accurate.    Arman Filter, NP 05/05/13 2027

## 2013-05-05 NOTE — ED Notes (Signed)
Patient is alert and oriented x3.  She was given DC instructions and follow up visit instructions.  Patient gave verbal understanding. She was DC ambulatory under her own power to home.  V/S stable.  He was not showing any signs of distress on DC 

## 2013-05-08 NOTE — ED Provider Notes (Signed)
Medical screening examination/treatment/procedure(s) were performed by non-physician practitioner and as supervising physician I was immediately available for consultation/collaboration.  Raeford Razor, MD 05/08/13 [phone]

## 2013-07-06 ENCOUNTER — Encounter (HOSPITAL_COMMUNITY): Payer: Self-pay | Admitting: *Deleted

## 2013-07-06 ENCOUNTER — Emergency Department (HOSPITAL_COMMUNITY)
Admission: EM | Admit: 2013-07-06 | Discharge: 2013-07-06 | Disposition: A | Discharge status: Other Acute Inpatient Hospital | Payer: Federal, State, Local not specified - PPO | Attending: Emergency Medicine | Admitting: Emergency Medicine

## 2013-07-06 ENCOUNTER — Inpatient Hospital Stay (HOSPITAL_COMMUNITY)
Admission: AD | Admit: 2013-07-06 | Discharge: 2013-07-12 | DRG: 885 | Disposition: A | Discharge status: Home / Self Care | Payer: Federal, State, Local not specified - PPO | Source: Intra-hospital | Attending: Psychiatry | Admitting: Psychiatry

## 2013-07-06 ENCOUNTER — Encounter (HOSPITAL_COMMUNITY): Payer: Self-pay | Admitting: Emergency Medicine

## 2013-07-06 DIAGNOSIS — G4700 Insomnia, unspecified: Secondary | ICD-10-CM | POA: Diagnosis present

## 2013-07-06 DIAGNOSIS — Z87442 Personal history of urinary calculi: Secondary | ICD-10-CM

## 2013-07-06 DIAGNOSIS — R45851 Suicidal ideations: Secondary | ICD-10-CM

## 2013-07-06 DIAGNOSIS — F332 Major depressive disorder, recurrent severe without psychotic features: Principal | ICD-10-CM | POA: Diagnosis present

## 2013-07-06 DIAGNOSIS — F411 Generalized anxiety disorder: Secondary | ICD-10-CM | POA: Diagnosis present

## 2013-07-06 DIAGNOSIS — Z3202 Encounter for pregnancy test, result negative: Secondary | ICD-10-CM | POA: Insufficient documentation

## 2013-07-06 DIAGNOSIS — F3289 Other specified depressive episodes: Secondary | ICD-10-CM | POA: Insufficient documentation

## 2013-07-06 DIAGNOSIS — F32A Depression, unspecified: Secondary | ICD-10-CM

## 2013-07-06 DIAGNOSIS — F329 Major depressive disorder, single episode, unspecified: Secondary | ICD-10-CM | POA: Insufficient documentation

## 2013-07-06 HISTORY — DX: Depression, unspecified: F32.A

## 2013-07-06 HISTORY — DX: Major depressive disorder, single episode, unspecified: F32.9

## 2013-07-06 HISTORY — DX: Anxiety disorder, unspecified: F41.9

## 2013-07-06 LAB — RAPID URINE DRUG SCREEN, HOSP PERFORMED
Amphetamines: NOT DETECTED
Barbiturates: NOT DETECTED
Benzodiazepines: NOT DETECTED
Opiates: NOT DETECTED

## 2013-07-06 LAB — CBC WITH DIFFERENTIAL/PLATELET
Eosinophils Absolute: 0 10*3/uL (ref 0.0–0.7)
Hemoglobin: 13.4 g/dL (ref 12.0–15.0)
Lymphocytes Relative: 38 % (ref 12–46)
Lymphs Abs: 2.1 10*3/uL (ref 0.7–4.0)
MCH: 28.9 pg (ref 26.0–34.0)
Monocytes Absolute: 0.3 10*3/uL (ref 0.1–1.0)
Monocytes Relative: 5 % (ref 3–12)
Neutro Abs: 3.1 10*3/uL (ref 1.7–7.7)
Neutrophils Relative %: 56 % (ref 43–77)
Platelets: 399 10*3/uL (ref 150–400)
RBC: 4.63 MIL/uL (ref 3.87–5.11)
WBC: 5.5 10*3/uL (ref 4.0–10.5)

## 2013-07-06 LAB — URINALYSIS, ROUTINE W REFLEX MICROSCOPIC
Bilirubin Urine: NEGATIVE
Glucose, UA: NEGATIVE mg/dL
Hgb urine dipstick: NEGATIVE
Protein, ur: NEGATIVE mg/dL
Specific Gravity, Urine: 1.025 (ref 1.005–1.030)
pH: 5.5 (ref 5.0–8.0)

## 2013-07-06 LAB — PREGNANCY, URINE: Preg Test, Ur: NEGATIVE

## 2013-07-06 LAB — BASIC METABOLIC PANEL
GFR calc Af Amer: >90 mL/min (ref >90)
GFR calc non Af Amer: >90 mL/min (ref >90)
Glucose, Bld: 111 mg/dL — ABNORMAL HIGH (ref 70–99)
Potassium: 4.2 mEq/L (ref 3.5–5.1)
Sodium: 139 mEq/L (ref 135–145)

## 2013-07-06 LAB — URINE MICROSCOPIC-ADD ON

## 2013-07-06 MED ORDER — ALUM & MAG HYDROXIDE-SIMETH 200-200-20 MG/5ML PO SUSP: 30.0000 mL | ORAL | Status: DC | PRN

## 2013-07-06 MED ORDER — MAGNESIUM HYDROXIDE 400 MG/5ML PO SUSP: 30.0000 mL | Freq: Every day | ORAL | Status: DC | PRN

## 2013-07-06 MED ORDER — FLUOXETINE HCL 20 MG PO CAPS
20.0000 mg | ORAL_CAPSULE | Freq: Every day | ORAL | Status: DC
  Administered 2013-07-07 – 2013-07-11 (×5): 20 mg via ORAL
  Filled 2013-07-06 (×7): qty 1

## 2013-07-06 MED ORDER — ACETAMINOPHEN 325 MG PO TABS
650.0000 mg | ORAL_TABLET | Freq: Four times a day (QID) | ORAL | Status: DC | PRN
  Administered 2013-07-09: 650 mg via ORAL
  Filled 2013-07-06: qty 2

## 2013-07-06 MED ORDER — FLUOXETINE HCL 20 MG PO CAPS
20.0000 mg | ORAL_CAPSULE | Freq: Every day | ORAL | Status: DC
  Administered 2013-07-06: 20 mg via ORAL
  Filled 2013-07-06: qty 1

## 2013-07-06 NOTE — Tx Team (Signed)
Initial Interdisciplinary Treatment Plan  PATIENT STRENGTHS: (choose at least two) Ability for insight Average or above average intelligence Communication skills General fund of knowledge Motivation for treatment/growth Physical Health Supportive family/friends Work skills  PATIENT STRESSORS: Financial difficulties Occupational concerns   PROBLEM LIST: Problem List/Patient Goals Date to be addressed Date deferred Reason deferred Estimated date of resolution  Suicidal ideation 12.11.2015   D/c        depression 07/06/2914   D/c        Substance abuse 07/06/2014   D/c                           DISCHARGE CRITERIA:  Ability to meet basic life and health needs Improved stabilization in mood, thinking, and/or behavior Need for constant or close observation no longer present Reduction of life-threatening or endangering symptoms to within safe limits Safe-care adequate arrangements made Verbal commitment to aftercare and medication compliance  PRELIMINARY DISCHARGE PLAN: Participate in family therapy Return to previous living arrangement Return to previous work or school arrangements  PATIENT/FAMIILY INVOLVEMENT: This treatment plan has been presented to and reviewed with the patient, Joanna Osborn.  The patient and family have been given the opportunity to ask questions and make suggestions.  [name] 07/06/2013, 6:28 PM

## 2013-07-06 NOTE — Consult Note (Signed)
[facility] Face-to-Face Psychiatry Consult   Reason for Consult:  Increasing depression Referring Physician:  EDP  Joanna Osborn is an 42 y.o. female.  Assessment: AXIS I:  Major Depression, Recurrent severe AXIS II:  Deferred AXIS III:   Past Medical History  Diagnosis Date  . Kidney stone    AXIS IV:  other psychosocial or environmental problems and problems related to social environment AXIS V:  11-20 some danger of hurting self or others possible OR occasionally fails to maintain minimal personal hygiene OR gross impairment in communication  Plan:  Recommend psychiatric Inpatient admission when medically cleared.  Subjective:   Joanna Osborn is a 42 y.o. female patient admitted with Major Depressive Disorder, recurrent without psychotic features.  HPI:  Patient states that she has been suffering with depression for years.  Patient states that she feels that "I don't want to be here anymore.  Everybody will be better off with me dead.  I have always been depressed; sometimes I'm up and sometimes I'm down, or in the middle.  I don't want to do it my self.  I would rather die.  I pray each night that I don't wake up"  Patient states that she has never sought help before and is here today because the feeling of depression is getting worse.  Patient states that there is no particular stressor most of the time but only stressor at this time is financial problems.  She is married and she and her husband both work "Just don't make enough."  Patient states that there is no problem with family, children, or job.  Husband is a good husband.  Patient denies homicidal ideation, psychosis, and paranoia.  HPI Elements:   Location:  [facility] ED. Quality:  Affecting patient mentally and physically. Severity:  Patient wants to die.  Past Psychiatric History: Past Medical History  Diagnosis Date  . Kidney stone     reports that she has never smoked. She does not have any smokeless tobacco  history on file. She reports that she drinks alcohol. She reports that she does not use illicit drugs. Family History  Problem Relation Age of Onset  . Hypertension Other   . Cancer Other            Allergies:  No Known Allergies  ACT Assessment Complete:  No:   Past Psychiatric History: Diagnosis:  Major Depressive Disorder, recurrent without psychotic features and Suicidal ideation  Hospitalizations:  Denies  Outpatient Care:  Denies  Substance Abuse Care:  Denies  Self-Mutilation:  Denies  Suicidal Attempts:  Denies.  Thoughts but has never acted  Homicidal Behaviors:  Denies   Violent Behaviors:  Denies   Place of Residence:  [place] Marital Status:  Married Employed/Unemployed:  Employed Education:   Family Supports:  Yes Objective: Blood pressure 130/98, pulse 83, temperature 99.3 F (37.4 C), temperature source Oral, resp. rate 20, SpO2 99.00%.There is no height or weight on file to calculate BMI. Results for orders placed during the hospital encounter of 07/06/13 (from the past 72 hour(s))  PREGNANCY, URINE     Status: None   Collection Time    07/06/13  9:38 AM      Result Value Range   Preg Test, Ur NEGATIVE  NEGATIVE   Comment:            THE SENSITIVITY OF THIS     METHODOLOGY IS >20 mIU/mL.     No current facility-administered medications for this encounter.  Current Outpatient Prescriptions  Medication Sig Dispense Refill  . aspirin-acetaminophen-caffeine (EXCEDRIN MIGRAINE) 250-250-65 MG per tablet Take 1 tablet by mouth every 6 (six) hours as needed for pain.      Marland Kitchen ibuprofen (ADVIL,MOTRIN) 200 MG tablet Take 600 mg by mouth every 6 (six) hours as needed for pain.      . pseudoephedrine (SUDAFED) 120 MG 12 hr tablet Take 1 tablet (120 mg total) by mouth every 12 (twelve) hours.  20 tablet  0    Psychiatric Specialty Exam:     Blood pressure 130/98, pulse 83, temperature 99.3 F (37.4 C), temperature source Oral, resp. rate 20, SpO2  99.00%.There is no height or weight on file to calculate BMI.  General Appearance: Casual and Fairly Groomed  Eye Contact::  Minimal  Speech:  Clear and Coherent and Normal Rate  Volume:  Normal  Mood:  Depressed, Hopeless and Tearful  Affect:  Depressed, Flat, Restricted and Tearful  Thought Process:  Circumstantial and Goal Directed  Orientation:  Full (Time, Place, and Person)  Thought Content:  "I don't want to be here anymore"  Suicidal Thoughts:  Yes.  without intent/plan  Homicidal Thoughts:  No  Memory:  Immediate;   Good Recent;   Good Remote;   Good  Judgement:  Impaired  Insight:  Lacking  Psychomotor Activity:  Normal  Concentration:  Fair  Recall:  Good  Akathisia:  No  Handed:  Right  AIMS (if indicated):     Assets:  Communication Skills Desire for Improvement Housing Resilience Social Support Transportation  Sleep:      Face to face consult/interview with Dr. Ladona Ridgel  Treatment Plan Summary: Daily contact with patient to assess and evaluate symptoms and progress in treatment Medication management  Disposition:  Inpatient treatment recommended.  Patient is appropriate for [facility] 500 hall bed.  Start home medication and Prozac 20  Mg daily.  Seek placement for inpatient treatment.  Monitor for safety and stabilization until inpatient bed is found.  Cannon Quinton, FNP-BC 07/06/2013 10:02 AM

## 2013-07-06 NOTE — Consult Note (Signed)
Note reviewed and agreed with  

## 2013-07-06 NOTE — ED Notes (Signed)
Per sister-pt c/o of increased depression for the past 7 days. History of depression, not on any medications. SI denies HI, plan. Does not want to talk about it. Denies AH/VH.

## 2013-07-06 NOTE — ED Provider Notes (Signed)
CSN: [id number]     Arrival date & time 07/06/13  2841 History   First MD Initiated Contact with Patient 07/06/13 [phone]     Chief Complaint  Patient presents with  . Medical Clearance  . Depression    HPI Pt was seen at 0940. Per pt and her sister, c/o gradual onset and worsening of persistent depression for the past several weeks, worse over the past several days. Pt stated to her sister that she "doesn't want to be here anymore." Endorses same to ED staff today. States she has never been on meds or seen a mental health professional for these symptoms previously. Denies SA, no HI.    Past Medical History  Diagnosis Date  . Kidney stone    Past Surgical History  Procedure Laterality Date  . Tubal ligation  1999  . Cholecystectomy  2002  . Wrist surgery     Family History  Problem Relation Age of Onset  . Hypertension Other   . Cancer Other    History  Substance Use Topics  . Smoking status: Never Smoker   . Smokeless tobacco: Not on file  . Alcohol Use: Yes     Comment: occas    Review of Systems ROS: Statement: All systems negative except as marked or noted in the HPI; Constitutional: Negative for fever and chills. ; ; Eyes: Negative for eye pain, redness and discharge. ; ; ENMT: Negative for ear pain, hoarseness, nasal congestion, sinus pressure and sore throat. ; ; Cardiovascular: Negative for chest pain, palpitations, diaphoresis, dyspnea and peripheral edema. ; ; Respiratory: Negative for cough, wheezing and stridor. ; ; Gastrointestinal: Negative for nausea, vomiting, diarrhea, abdominal pain, blood in stool, hematemesis, jaundice and rectal bleeding. . ; ; Genitourinary: Negative for dysuria, flank pain and hematuria. ; ; Musculoskeletal: Negative for back pain and neck pain. Negative for swelling and trauma.; ; Skin: Negative for pruritus, rash, abrasions, blisters, bruising and skin lesion.; ; Neuro: Negative for headache, lightheadedness and neck stiffness. Negative for  weakness, altered level of consciousness , altered mental status, extremity weakness, paresthesias, involuntary movement, seizure and syncope.; Psych:  +depression, +SI. No SA, no HI, no hallucinations.      Allergies  Review of patient's allergies indicates no known allergies.  Home Medications  No current outpatient prescriptions on file. BP 130/98  Pulse 83  Temp(Src) 99.3 F (37.4 C) (Oral)  Resp 20  SpO2 99% Physical Exam 0945: Physical examination:  Nursing notes reviewed; Vital signs and O2 SAT reviewed;  Constitutional: Well developed, Well nourished, Well hydrated, In no acute distress; Head:  Normocephalic, atraumatic; Eyes: EOMI, PERRL, No scleral icterus; ENMT: Mouth and pharynx normal, Mucous membranes moist; Neck: Supple, Full range of motion, No lymphadenopathy; Cardiovascular: Regular rate and rhythm, No murmur, rub, or gallop; Respiratory: Breath sounds clear & equal bilaterally, No rales, rhonchi, wheezes.  Speaking full sentences with ease, Normal respiratory effort/excursion; Chest: Nontender, Movement normal;; Genitourinary: No CVA tenderness; Extremities: Pulses normal, No tenderness, No edema, No calf edema or asymmetry.; Neuro: AA&Ox3, Major CN grossly intact.  Speech clear. Texting on cellphone during exam. No gross focal motor or sensory deficits in extremities.; Skin: Color normal, Warm, Dry.; Psych:  Affect flat, poor eye contact.    ED Course  Procedures    EKG Interpretation   None       MDM  MDM Reviewed: previous chart, nursing note and vitals Interpretation: labs     Results for orders placed during the hospital encounter of  07/06/13  BASIC METABOLIC PANEL      Result Value Range   Sodium 139  135 - 145 mEq/L   Potassium 4.2  3.5 - 5.1 mEq/L   Chloride 104  96 - 112 mEq/L   CO2 25  19 - 32 mEq/L   Glucose, Bld 111 (*) 70 - 99 mg/dL   BUN 14  6 - 23 mg/dL   Creatinine, Ser 1.61  0.50 - 1.10 mg/dL   Calcium 9.5  8.4 - 09.6 mg/dL   GFR  calc non Af Amer >90  >90 mL/min   GFR calc Af Amer >90  >90 mL/min  CBC WITH DIFFERENTIAL      Result Value Range   WBC 5.5  4.0 - 10.5 K/uL   RBC 4.63  3.87 - 5.11 MIL/uL   Hemoglobin 13.4  12.0 - 15.0 g/dL   HCT 04.5  40.9 - 81.1 %   MCV 88.8  78.0 - 100.0 fL   MCH 28.9  26.0 - 34.0 pg   MCHC 32.6  30.0 - 36.0 g/dL   RDW 91.4  78.2 - 95.6 %   Platelets 399  150 - 400 K/uL   Neutrophils Relative % 56  43 - 77 %   Neutro Abs 3.1  1.7 - 7.7 K/uL   Lymphocytes Relative 38  12 - 46 %   Lymphs Abs 2.1  0.7 - 4.0 K/uL   Monocytes Relative 5  3 - 12 %   Monocytes Absolute 0.3  0.1 - 1.0 K/uL   Eosinophils Relative 1  0 - 5 %   Eosinophils Absolute 0.0  0.0 - 0.7 K/uL   Basophils Relative 0  0 - 1 %   Basophils Absolute 0.0  0.0 - 0.1 K/uL  URINALYSIS, ROUTINE W REFLEX MICROSCOPIC      Result Value Range   Color, Urine YELLOW  YELLOW   APPearance HAZY (*) CLEAR   Specific Gravity, Urine 1.025  1.005 - 1.030   pH 5.5  5.0 - 8.0   Glucose, UA NEGATIVE  NEGATIVE mg/dL   Hgb urine dipstick NEGATIVE  NEGATIVE   Bilirubin Urine NEGATIVE  NEGATIVE   Ketones, ur NEGATIVE  NEGATIVE mg/dL   Protein, ur NEGATIVE  NEGATIVE mg/dL   Urobilinogen, UA 0.2  0.0 - 1.0 mg/dL   Nitrite NEGATIVE  NEGATIVE   Leukocytes, UA MODERATE (*) NEGATIVE  PREGNANCY, URINE      Result Value Range   Preg Test, Ur NEGATIVE  NEGATIVE  URINE RAPID DRUG SCREEN (HOSP PERFORMED)      Result Value Range   Opiates NONE DETECTED  NONE DETECTED   Cocaine NONE DETECTED  NONE DETECTED   Benzodiazepines NONE DETECTED  NONE DETECTED   Amphetamines NONE DETECTED  NONE DETECTED   Tetrahydrocannabinol NONE DETECTED  NONE DETECTED   Barbiturates NONE DETECTED  NONE DETECTED  ETHANOL      Result Value Range   Alcohol, Ethyl (B) <11  0 - 11 mg/dL  URINE MICROSCOPIC-ADD ON      Result Value Range   Squamous Epithelial / LPF RARE  RARE   WBC, UA 11-20  <3 WBC/hpf   RBC / HPF 0-2  <3 RBC/hpf   Bacteria, UA MANY (*) RARE    Urine-Other MUCOUS PRESENT       1100:  Awaiting TTS eval.  1430:  Pt evaluated by psychiatric team. She is accepted to [facility].   Laray Anger, DO 07/06/13 [phone]

## 2013-07-06 NOTE — Progress Notes (Signed)
Adult Psychoeducational Group Note  Date:  07/06/2013 Time:  9:19 PM  Group Topic/Focus:  Goals Group:   The focus of this group is to help patients establish daily goals to achieve during treatment and discuss how the patient can incorporate goal setting into their daily lives to aide in recovery.  Participation Level:  Active  Participation Quality:  Appropriate  Affect:  Appropriate  Cognitive:  Appropriate  Insight: Appropriate  Engagement in Group:  Engaged  Modes of Intervention:  Discussion  Additional Comments:  Pt stated that she was ok but didn't really want to share tonight.  Terie Purser R 07/06/2013, 9:19 PM

## 2013-07-06 NOTE — ED Notes (Signed)
Pt has had depression in the past but usually comes out of it on her own it has happened with season change . Pt has had depression for seven days and is not currently on medication.

## 2013-07-06 NOTE — Progress Notes (Signed)
Patient ID: DESIRRE EICKHOFF, female   DOB: 1970/10/03, 42 y.o.   MRN: [id number] Patient's first admission to [facility].  Patient stated she has been depressed for years.  "I don't want to be here anymore."  Stated she and her husband have financial problems.  They "just don't make enough."  Works at [employer] in [place] 6 yrs.  Lives in private residence with husband and will return home after discharge.  Stated they have 2 children Angola age 59 yrs, and Joselyn Glassman 41 yrs old.  Tubal ligation 1999, cholecystectomy 2002, right wrist cyst removed 2009.  Tatoos right foot, left neck, right ankle, left lower arm tatoo.  Old surgical scar middle abdomen.  Denied physical, sexual or verbal abuse.  Denied use of tobacco, drugs.  Stated she does have one glass wine twice monthly.  Denied SI and HI during admission.  Contracts for admission.  Denied A/V hallucinations.  Stated she has never tried suicide.  No criminal record.  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   Does have history of kidney stones, no surgery, stone dormant.   Fall risk information given and discussed with patient.  Meal/drink given to patient.  No locker needed for patient's belongings. Patient has been calm, cooperative, pleasant

## 2013-07-06 NOTE — Progress Notes (Signed)
Writer consulted with the Psychiatrist (Dr. Ladona Ridgel) and the NP [facility]) regarding the patient meeting criteria for inpatient hospitalization.   CSW informed the ER MD that the patient is going to [facility] Bed 506-1.  Writer informed the nurse working with the patient.  Dr. Addison Naegeli is the accepting doctor.  Writer faxed the support paper to [facility].

## 2013-07-06 NOTE — ED Notes (Signed)
Patient's sister took all of patient's belongings with her

## 2013-07-06 NOTE — ED Notes (Signed)
Family at bedside. 

## 2013-07-07 DIAGNOSIS — F332 Major depressive disorder, recurrent severe without psychotic features: Principal | ICD-10-CM

## 2013-07-07 DIAGNOSIS — R45851 Suicidal ideations: Secondary | ICD-10-CM

## 2013-07-07 NOTE — BHH Counselor (Addendum)
Adult Comprehensive Assessment  Patient ID: Joanna Osborn, female   DOB: 1970-12-19, 42 y.o.   MRN: [id number]  Information Source: Information source: Patient  Current Stressors:  Educational / Learning stressors: None Employment / Job issues: Business is slow Family Relationships: family doesn't understand her mental illness Surveyor, quantity / Lack of resources (include bankruptcy): finances are tight, struggling Housing / Lack of housing: None Physical health (include injuries & life threatening diseases): None Social relationships: None Substance abuse: None Bereavement / Loss: None  Living/Environment/Situation:  Living Arrangements: Spouse/significant other Living conditions (as described by patient or guardian): Comfortable How long has patient lived in current situation?: 4 years What is atmosphere in current home: Supportive;Loving;Comfortable  Family History:  Marital status: Married Number of Years Married: 15 What types of issues is patient dealing with in the relationship?: No issues reported Additional relationship information: N/A Does patient have children?: Yes How many children?: 2 How is patient's relationship with their children?: Pt has a 35 and 57 year old, reports a good relationship with them.    Childhood History:  By whom was/is the patient raised?: Mother/father and step-parent Additional childhood history information: Pt states that she had a good childhood.  Description of patient's relationship with caregiver when they were a child: Pt reports getting along well with parents growing up Patient's description of current relationship with people who raised him/her: Pt reports still having a good relationship with parents today Does patient have siblings?: Yes Number of Siblings: 1 Description of patient's current relationship with siblings: Distant relationship with sister Did patient suffer any verbal/emotional/physical/sexual abuse as a child?: No Did  patient suffer from severe childhood neglect?: No Has patient ever been sexually abused/assaulted/raped as an adolescent or adult?: No Was the patient ever a victim of a crime or a disaster?: No Witnessed domestic violence?: No Has patient been effected by domestic violence as an adult?: No  Education:  Highest grade of school patient has completed: 3 years of college, cosmetology school Currently a student?: No Learning disability?: No  Employment/Work Situation:   Employment situation: Employed 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, self employed Patient's job has been impacted by current illness: No What is the longest time patient has a held a job?: 20 years Where was the patient employed at that time?: current job, hair stylist self employed Has patient ever been in the [employer]?: No Has patient ever served in Buyer, retail?: No  Financial Resources:   Financial resources: Income from employment;Income from spouse;Private insurance Does patient have a representative payee or guardian?: No  Alcohol/Substance Abuse:   What has been your use of drugs/alcohol within the last 12 months?: Pt denies alcohol and drug abuse If attempted suicide, did drugs/alcohol play a role in this?: No Alcohol/Substance Abuse Treatment Hx: Denies past history If yes, describe treatment: N/A Has alcohol/substance abuse ever caused legal problems?: No  Social Support System:   Patient's Community Support System: Good Describe Community Support System: Pt reports family is supportive  Type of faith/religion: Christian How does patient's faith help to cope with current illness?: prayer, church attendance  Leisure/Recreation:   Leisure and Hobbies: crafts, make jewelry  Strengths/Needs:   What things does the patient do well?: pt is unable to name anything at this time In what areas does patient struggle / problems for patient:  Depression  Discharge Plan:   Does patient have access to transportation?: Yes Will patient be returning to same living  situation after discharge?: Yes Currently receiving community mental health services: No If no, would patient like referral for services when discharged?: Yes (What county?) [facility] Does patient have financial barriers related to discharge medications?: No  Summary/Recommendations:     Patient is a 42 year old African American female admitted with a diagnosis of Major Depressive Disorder.  Patient lives in [place] with her family.  Pt reports no recent stressors but just feeling down and depressed for some time now.  Patient will benefit from crisis stabilization, medication evaluation, group therapy and psycho education in addition to case management for discharge planning.    Horton, Salome Arnt. 07/07/2013

## 2013-07-07 NOTE — Progress Notes (Signed)
D) Pt has attended the groups and interacts with her peers. Rates her depression at an 8 and her hopelessness at a 6. Denies SI and HI. States she has a good relationship with her family, but they struggle finically. Has been partisipating in groups. A) given support, reassurance, praise and encouragement. R) Denies Si and HI.

## 2013-07-07 NOTE — BHH Group Notes (Signed)
[facility] LCSW Aftercare Discharge Planning Group Note   07/07/2013 12:43 PM    Participation Quality:  Appropraite  Mood/Affect:  Appropriate  Depression Rating:  8  Anxiety Rating:  0  Thoughts of Suicide:  No  Will you contract for safety?   NA  Current AVH:  No  Plan for Discharge/Comments:  Patient attended discharge planning group and actively participated in group.  She advised she was not being seen for outpatient services.  CSW provided all participants with daily workbook.   Transportation Means: Patient has transportation.   Supports:  Patient has a support system.   Loria Lacina, Joesph July

## 2013-07-07 NOTE — BHH Group Notes (Signed)
[facility] LCSW Group Therapy  Feelings Around Relapse 1:15 -2:30        07/07/2013   Type of Therapy:  Group Therapy  Participation Level:  Appropriate  Participation Quality:  Appropriate  Affect:  Appropriate  Cognitive:  Attentive Appropriate  Insight:  Developing/Improving Engaged  Engagement in Therapy: Developing/Improving  Engaged  Modes of Intervention:  Discussion Exploration Problem-Solving Supportive  Summary of Progress/Problems:  The topic for today was feelings around relapse.    Patient shared she does not understand relapse because she has never been better.  She was state she has made in big step in getting better by coming into the hospital.  [name] 07/07/2013

## 2013-07-07 NOTE — H&P (Signed)
Psychiatric Admission Assessment Adult  Patient Identification:  Joanna Osborn Date of Evaluation:  07/07/2013 Chief Complaint:  MAJOR DEPRESSIVE DISORDER History of Present Illness: Patient is admitted voluntarily and emergently to [facility] from [facility] long emergency department for worsening symptoms of her depression with a suicidal ideation. Patient has no previous history of for acute psychiatric hospitalization or outpatient psychiatric services. Patient stated she does not want to get help but her family insisted she should get help. Patient states that she feels that "I don't want to be here anymore. Everybody will be better olff with me dead. I have always been depressed; sometimes I'm up and sometimes I'm down, or in the middle. I don't want to do it my self. I would rather die. I pray each night that I don't wake up" Patient states that she has never sought help before and is here today because the feeling of depression is getting worse. Patient states that there is no particular stressor most of the time but only stressor at this time is financial problems. She is married and she and her husband both work "Just don't make enough." Patient states that there is no problem with family, children, or job. Husband is a good husband. Patient denies homicidal ideation, psychosis, and paranoia  Elements:  Location:  Psychiatric inpatient hospital unit. Quality:  Depression. Severity:  Suicidal ideations. Timing:  Unknown. Duration:  Several weeks. Context:  Financial difficulties and her family. Associated Signs/Synptoms: Depression Symptoms:  depressed mood, anhedonia, insomnia, psychomotor retardation, fatigue, feelings of worthlessness/guilt, difficulty concentrating, hopelessness, recurrent thoughts of death, anxiety, weight loss, decreased labido, decreased appetite, (Hypo) Manic Symptoms:  Distractibility, Anxiety Symptoms:  Excessive Worry, Psychotic  Symptoms:  Denied PTSD Symptoms: NA  Psychiatric Specialty Exam: Physical Exam  ROS  Blood pressure 107/73, pulse 101, temperature 98.5 F (36.9 C), temperature source Oral, resp. rate 17, height 5' 5.5" (1.664 m), weight 90.719 kg (200 lb), last menstrual period 05/10/2013.Body mass index is 32.76 kg/(m^2).  General Appearance: Guarded  Eye Contact::  Minimal  Speech:  Clear and Coherent and Slow  Volume:  Decreased  Mood:  Anxious and Depressed  Affect:  Constricted and Depressed  Thought Process:  Goal Directed and Intact  Orientation:  Full (Time, Place, and Person)  Thought Content:  WDL  Suicidal Thoughts:  Yes.  without intent/plan  Homicidal Thoughts:  No  Memory:  Immediate;   Fair  Judgement:  Intact  Insight:  Fair  Psychomotor Activity:  Decreased  Concentration:  Fair  Recall:  NA  Akathisia:  NA  Handed:  Right  AIMS (if indicated):     Assets:  Communication Skills Desire for Improvement Housing Leisure Time Physical Health Resilience Social Support Transportation Vocational/Educational  Sleep:  Number of Hours: 6.75    Past Psychiatric History: Diagnosis:  Hospitalizations:  Outpatient Care:  Substance Abuse Care:  Self-Mutilation:  Suicidal Attempts:  Violent Behaviors:   Past Medical History:   Past Medical History  Diagnosis Date  . Kidney stone   . Anxiety   . Depression    None. Allergies:  No Known Allergies PTA Medications: No prescriptions prior to admission    Previous Psychotropic Medications:  Medication/Dose  None                Substance Abuse History in the last 12 months:  yes  Consequences of Substance Abuse: NA  Social History:  reports that she has never smoked. She does not have any smokeless  tobacco history on file. She reports that she drinks about 0.6 ounces of alcohol per week. She reports that she does not use illicit drugs. Additional Social History: Pain Medications: tylenol     ibuprofen Prescriptions: none Over the Counter: tylenol  ibuprofen History of alcohol / drug use?: Yes Longest period of sobriety (when/how long): only two drinks wine twice monthly for years Negative Consequences of Use: Financial Withdrawal Symptoms: Other (Comment) (none)                    Current Place of Residence:   Place of Birth:   Family Members: Marital Status:  Married Children:  Sons:  Daughters: Relationships: Education:  HS Print production planner Problems/Performance: Religious Beliefs/Practices: History of Abuse (Emotional/Phsycial/Sexual) Teacher, music History:  None. Legal History: Hobbies/Interests:  Family History:   Family History  Problem Relation Age of Onset  . Hypertension Other   . Cancer Other     Results for orders placed during the hospital encounter of 07/06/13 (from the past 72 hour(s))  URINALYSIS, ROUTINE W REFLEX MICROSCOPIC     Status: Abnormal   Collection Time    07/06/13  9:38 AM      Result Value Range   Color, Urine YELLOW  YELLOW   APPearance HAZY (*) CLEAR   Specific Gravity, Urine 1.025  1.005 - 1.030   pH 5.5  5.0 - 8.0   Glucose, UA NEGATIVE  NEGATIVE mg/dL   Hgb urine dipstick NEGATIVE  NEGATIVE   Bilirubin Urine NEGATIVE  NEGATIVE   Ketones, ur NEGATIVE  NEGATIVE mg/dL   Protein, ur NEGATIVE  NEGATIVE mg/dL   Urobilinogen, UA 0.2  0.0 - 1.0 mg/dL   Nitrite NEGATIVE  NEGATIVE   Leukocytes, UA MODERATE (*) NEGATIVE  PREGNANCY, URINE     Status: None   Collection Time    07/06/13  9:38 AM      Result Value Range   Preg Test, Ur NEGATIVE  NEGATIVE   Comment:            THE SENSITIVITY OF THIS     METHODOLOGY IS >20 mIU/mL.  URINE RAPID DRUG SCREEN (HOSP PERFORMED)     Status: None   Collection Time    07/06/13  9:38 AM      Result Value Range   Opiates NONE DETECTED  NONE DETECTED   Cocaine NONE DETECTED  NONE DETECTED   Benzodiazepines NONE DETECTED  NONE DETECTED   Amphetamines  NONE DETECTED  NONE DETECTED   Tetrahydrocannabinol NONE DETECTED  NONE DETECTED   Barbiturates NONE DETECTED  NONE DETECTED   Comment:            DRUG SCREEN FOR MEDICAL PURPOSES     ONLY.  IF CONFIRMATION IS NEEDED     FOR ANY PURPOSE, NOTIFY LAB     WITHIN 5 DAYS.                LOWEST DETECTABLE LIMITS     FOR URINE DRUG SCREEN     Drug Class       Cutoff (ng/mL)     Amphetamine      1000     Barbiturate      200     Benzodiazepine   200     Tricyclics       300     Opiates          300     Cocaine  300     THC              50  URINE MICROSCOPIC-ADD ON     Status: Abnormal   Collection Time    07/06/13  9:38 AM      Result Value Range   Squamous Epithelial / LPF RARE  RARE   WBC, UA 11-20  <3 WBC/hpf   RBC / HPF 0-2  <3 RBC/hpf   Bacteria, UA MANY (*) RARE   Urine-Other MUCOUS PRESENT    BASIC METABOLIC PANEL     Status: Abnormal   Collection Time    07/06/13 10:15 AM      Result Value Range   Sodium 139  135 - 145 mEq/L   Potassium 4.2  3.5 - 5.1 mEq/L   Chloride 104  96 - 112 mEq/L   CO2 25  19 - 32 mEq/L   Glucose, Bld 111 (*) 70 - 99 mg/dL   BUN 14  6 - 23 mg/dL   Creatinine, Ser 4.54  0.50 - 1.10 mg/dL   Calcium 9.5  8.4 - 09.8 mg/dL   GFR calc non Af Amer >90  >90 mL/min   GFR calc Af Amer >90  >90 mL/min   Comment: (NOTE)     The eGFR has been calculated using the CKD EPI equation.     This calculation has not been validated in all clinical situations.     eGFR's persistently <90 mL/min signify possible Chronic Kidney     Disease.  CBC WITH DIFFERENTIAL     Status: None   Collection Time    07/06/13 10:15 AM      Result Value Range   WBC 5.5  4.0 - 10.5 K/uL   RBC 4.63  3.87 - 5.11 MIL/uL   Hemoglobin 13.4  12.0 - 15.0 g/dL   HCT 11.9  14.7 - 82.9 %   MCV 88.8  78.0 - 100.0 fL   MCH 28.9  26.0 - 34.0 pg   MCHC 32.6  30.0 - 36.0 g/dL   RDW 56.2  13.0 - 86.5 %   Platelets 399  150 - 400 K/uL   Neutrophils Relative % 56  43 - 77 %   Neutro  Abs 3.1  1.7 - 7.7 K/uL   Lymphocytes Relative 38  12 - 46 %   Lymphs Abs 2.1  0.7 - 4.0 K/uL   Monocytes Relative 5  3 - 12 %   Monocytes Absolute 0.3  0.1 - 1.0 K/uL   Eosinophils Relative 1  0 - 5 %   Eosinophils Absolute 0.0  0.0 - 0.7 K/uL   Basophils Relative 0  0 - 1 %   Basophils Absolute 0.0  0.0 - 0.1 K/uL  ETHANOL     Status: None   Collection Time    07/06/13 10:15 AM      Result Value Range   Alcohol, Ethyl (B) <11  0 - 11 mg/dL   Comment:            LOWEST DETECTABLE LIMIT FOR     SERUM ALCOHOL IS 11 mg/dL     FOR MEDICAL PURPOSES ONLY   Psychological Evaluations:  Assessment:   DSM5:  Schizophrenia Disorders:   Obsessive-Compulsive Disorders:   Trauma-Stressor Disorders:   Substance/Addictive Disorders:   Depressive Disorders:  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 Kidney stone   . Anxiety   . Depression    AXIS IV:  economic problems, other psychosocial or environmental problems and problems related to social environment AXIS V:  41-50 serious symptoms  Treatment Plan/Recommendations:  Recommend acute psychiatric hospitalization for crisis stabilization, safety monitoring and medication management.  Treatment Plan Summary: Daily contact with patient to assess and evaluate symptoms and progress in treatment Medication management Current Medications:  Current Facility-Administered Medications  Medication Dose Route Frequency Provider Last Rate Last Dose  . acetaminophen (TYLENOL) tablet 650 mg  650 mg Oral Q6H PRN Shuvon Rankin, NP      . alum & mag hydroxide-simeth (MAALOX/MYLANTA) 200-200-20 MG/5ML suspension 30 mL  30 mL Oral Q4H PRN Shuvon Rankin, NP      . FLUoxetine (PROZAC) capsule 20 mg  20 mg Oral Daily Shuvon Rankin, NP   20 mg at 07/07/13 0809  . magnesium hydroxide (MILK OF MAGNESIA) suspension 30 mL  30 mL Oral Daily  PRN Shuvon Rankin, NP        Observation Level/Precautions:  15 minute checks  Laboratory:  Reviewed admission labs  Psychotherapy:  Individual, group and milieu therapy. Patient benefit from the CBT and interpersonal psychotherapies   Medications:  Fluoxetine 20 mg daily for depression and may use trazodone 50 mg at bedtime for sleep if needed   Consultations:  None   Discharge Concerns:  Safety   Estimated LOS: 4-7 days   Other:     I certify that inpatient services furnished can reasonably be expected to improve the patient's condition.   Tristyn Demarest,JANARDHAHA R. 12/12/20149:14 AM

## 2013-07-07 NOTE — Tx Team (Signed)
Interdisciplinary Treatment Plan Update   Date Reviewed:  07/07/2013  Time Reviewed:  8:36 AM  Progress in Treatment:   Attending groups: Yes Participating in groups: Yes Taking medication as prescribed: Yes  Tolerating medication: Yes Family/Significant other contact made: Yes  Patient understands diagnosis: Yes  Discussing patient identified problems/goals with staff: Yes Medical problems stabilized or resolved: Yes Denies suicidal/homicidal ideation: Yes Patient has not harmed self or others: Yes  For review of initial/current patient goals, please see plan of care.  Estimated Length of Stay:  3-5 days  Reasons for Continued Hospitalization:  Anxiety Depression Medication stabilization Suicidal ideation  New Problems/Goals identified:    Discharge Plan or Barriers:   Home with outpatient follow up  Additional Comments:  Joanna Osborn is a 42 y.o. female patient admitted with Major Depressive Disorder, recurrent without psychotic features.  Patient states that she has been suffering with depression for years. Patient states that she feels that "I don't want to be here anymore. Everybody will be better off with me dead. I have always been depressed; sometimes I'm up and sometimes I'm down, or in the middle. I don't want to do it my self. I would rather die. I pray each night that I don't wake up" Patient states that she has never sought help before and is here today because the feeling of depression is getting worse. Patient states that there is no particular stressor most of the time but only stressor at this time is financial problems.   Attendees:  Patient:  07/07/2013 8:36 AM   Signature: Mervyn Gay, MD 07/07/2013 8:36 AM  Signature:  Verne Spurr, PA 07/07/2013 8:36 AM  Signature: Harold Barban, RN 07/07/2013 8:36 AM  Signature:Beverly Terrilee Croak, RN 07/07/2013 8:36 AM  Signature:   07/07/2013 8:36 AM  Signature:  Juline Patch, LCSW 07/07/2013 8:36 AM  Signature:   Reyes Ivan, LCSW 07/07/2013 8:36 AM  Signature:  Tomasita Morrow, Care Coordinator 07/07/2013 8:36 AM  Signature:  Aloha Gell, RN 07/07/2013 8:36 AM  Signature: Leighton Parody, RN 07/07/2013  8:36 AM  Signature:   Onnie Boer, RN [facility] 07/07/2013  8:36 AM  Signature:  Elizbeth Squires, Team Lead [place] 07/07/2013  8:36 AM    Scribe for Treatment Team:   Juline Patch,  07/07/2013 8:36 AM

## 2013-07-07 NOTE — Progress Notes (Signed)
D: Pt denies SI/HI/AVH. Pt is pleasant and cooperative. Pt affect very flat, and pt appeared depressed.   A: Pt was offered support and encouragement. Pt was given scheduled medications. Pt was encourage to attend groups. Q 15 minute checks were done for safety.  R:Pt attends groups and interacts with peers and staff. Pt is taking medication. Pt has no complaints at this time.Pt receptive to treatment and safety maintained on unit.

## 2013-07-07 NOTE — BHH Suicide Risk Assessment (Signed)
Suicide Risk Assessment  Admission Assessment     Nursing information obtained from:  Patient Demographic factors:  Low socioeconomic status Current Mental Status:    Loss Factors:  Financial problems / change in socioeconomic status Historical Factors:    Risk Reduction Factors:  Responsible for children under 41 years of age;Sense of responsibility to family;Employed;Living with another person, especially a relative;Positive social support  CLINICAL FACTORS:   Severe Anxiety and/or Agitation Depression:   Anhedonia Hopelessness Insomnia Recent sense of peace/wellbeing Severe Previous Psychiatric Diagnoses and Treatments  COGNITIVE FEATURES THAT CONTRIBUTE TO RISK:  Closed-mindedness Loss of executive function Polarized thinking Thought constriction (tunnel vision)    SUICIDE RISK:   Moderate:  Frequent suicidal ideation with limited intensity, and duration, some specificity in terms of plans, no associated intent, good self-control, limited dysphoria/symptomatology, some risk factors present, and identifiable protective factors, including available and accessible social support.  PLAN OF CARE: Admit voluntarily and emergently from [facility] long emergency department for depression and suicidal ideation. Patient needs crisis stabilization, safety monitoring and medication management.  I certify that inpatient services furnished can reasonably be expected to improve the patient's condition.  Joanna Osborn,JANARDHAHA R. 07/07/2013, 9:13 AM

## 2013-07-07 NOTE — Progress Notes (Signed)
Adult Psychoeducational Group Note  Date:  07/07/2013 Time:  11:24 AM  Group Topic/Focus:  Stages of Change:   The focus of this group is to explain the stages of change and help patients identify changes they want to make upon discharge.  Participation Level:  Active  Participation Quality:  Appropriate  Affect:  Appropriate  Cognitive:  Appropriate  Insight: Appropriate  Engagement in Group:  Engaged  Modes of Intervention:  Discussion and Education   Elijio Miles 07/07/2013, 11:24 AM

## 2013-07-08 DIAGNOSIS — F411 Generalized anxiety disorder: Secondary | ICD-10-CM

## 2013-07-08 LAB — URINE CULTURE: Colony Count: >=100000

## 2013-07-08 NOTE — Progress Notes (Signed)
[facility] Group Notes:  (Nursing/MHT/Case Management/Adjunct)  Date:  07/08/2013  Time:  10:34 PM  Type of Therapy:  Group Therapy  Participation Level:  Active  Participation Quality:  Appropriate  Affect:  Appropriate  Cognitive:  Appropriate  Insight:  Appropriate  Engagement in Group:  Engaged  Modes of Intervention:  Discussion  Summary of Progress/Problems:The patient expressed that she was able to develop coping skills from group earlier.  Octavio Manns 07/08/2013, 10:34 PM

## 2013-07-08 NOTE — Progress Notes (Signed)
Patient ID: Joanna Osborn, female   DOB: 12/12/1970, 42 y.o.   MRN: [id number]  D: Patient with dull, flat affect endorsing depression. Pt cooperative. A: Q 15 minute safety checks, encourage staff/peer interaction and group participation. Administer medications as ordered by MD. R: Patient denies SI at this time. Pt attended group session.

## 2013-07-08 NOTE — [facility] Group Notes (Signed)
[facility] Group Notes: (Clinical Social Work)   07/08/2013      Type of Therapy:  Group Therapy   Participation Level:  Did Not Attend    Ambrose Mantle, LCSW 07/08/2013, 5:08 PM

## 2013-07-08 NOTE — Progress Notes (Signed)
Psychoeducational Group Note  Date: 07/08/2013 Time:  1015  Group Topic/Focus:  Identifying Needs:   The focus of this group is to help patients identify their personal needs that have been historically problematic and identify healthy behaviors to address their needs.  Participation Level:  Active  Participation Quality:  Appropriate  Affect:  Appropriate  Cognitive:  Appropriate  Insight:  Engaged  Engagement in Group:  Engaged  Additional Comments:    Cordarious Zeek A  

## 2013-07-08 NOTE — Progress Notes (Signed)
D) Pt has been withdrawn much of the day. Did attend the groups this morning, but then went to bed to lie down. Pt rates her depression at an 8 and her hopelessness at a 6. States she feels poorly about herself. Affect remains flat and mood is depressed. Stated that she was going to work on her Saturday packet today. Denies SI and HI A) Given support and reassurance. Encouraged to come out of her room. Provided with a brief 1:1 R) Denies SI and HI.

## 2013-07-08 NOTE — Progress Notes (Signed)
 .  Psychoeducational Group Note    Date: 07/08/2013 Time: 0930  Goal Setting Purpose of Group: To be able to set a goal that is measurable and that can be accomplished in one day Participation Level:  Active  Participation Quality:  Appropriate  Affect:  Appropriate  Cognitive:  Oriented  Insight:  Improving  Engagement in Group:  Engaged  Additional Comments:    Dione Housekeeper

## 2013-07-08 NOTE — Progress Notes (Signed)
  D: Pt observed sleeping in bed with eyes closed. RR even and unlabored. No distress noted. Pt continued to appear depressed and sad. Pt continues to forward little , but was up in the dayroom earlier in the evening.   .  A: Q 15 minute checks were done for safety.  R: safety maintained on unit.

## 2013-07-08 NOTE — Progress Notes (Signed)
[facility] MD Progress Note  07/08/2013 2:03 PM Joanna Osborn  MRN:  [id number] Subjective:  Patient stated her sleep was "off and on", Trazodone ordered,.  Depression remains, stated she was having a "rough day and didn't want to get out of bed" but she did.  She still wants to go to sleep and not wake up.  Appetite is good. Diagnosis:   DSM5:  Depressive Disorders:  Major Depressive Disorder - Severe (296.23)  Axis I: Anxiety Disorder NOS and Major Depression, Recurrent severe Axis II: Deferred Axis III:  Past Medical History  Diagnosis Date  . Kidney stone   . Anxiety   . Depression    Axis IV: other psychosocial or environmental problems, problems related to social environment and problems with primary support group Axis V: 41-50 serious symptoms  ADL's:  Intact  Sleep: Poor  Appetite:  Good  Suicidal Ideation:  Plan:  vague Intent:  none Means:  none Homicidal Ideation:  Denies  Psychiatric Specialty Exam: Review of Systems  Constitutional: Negative.   HENT: Negative.   Eyes: Negative.   Respiratory: Negative.   Cardiovascular: Negative.   Gastrointestinal: Negative.   Genitourinary: Negative.   Musculoskeletal: Negative.   Skin: Negative.   Neurological: Negative.   Endo/Heme/Allergies: Negative.   Psychiatric/Behavioral: Positive for depression and suicidal ideas. The patient is nervous/anxious and has insomnia.     Blood pressure 114/81, pulse 90, temperature 98 F (36.7 C), temperature source Oral, resp. rate 18, height 5' 5.5" (1.664 m), weight 90.719 kg (200 lb), last menstrual period 05/10/2013.Body mass index is 32.76 kg/(m^2).  General Appearance: Casual  Eye Contact::  Fair  Speech:  Normal Rate  Volume:  Normal  Mood:  Depressed  Affect:  Congruent  Thought Process:  Coherent  Orientation:  Full (Time, Place, and Person)  Thought Content:  WDL  Suicidal Thoughts:  Yes.  with intent/plan  Homicidal Thoughts:  No  Memory:  Immediate;    Fair Recent;   Fair Remote;   Fair  Judgement:  Fair  Insight:  Fair  Psychomotor Activity:  Decreased  Concentration:  Fair  Recall:  Fair  Akathisia:  No  Handed:  Right  AIMS (if indicated):     Assets:  Leisure Time Physical Health Resilience Social Support  Sleep:  Number of Hours: 5.5   Current Medications: Current Facility-Administered Medications  Medication Dose Route Frequency Provider Last Rate Last Dose  . acetaminophen (TYLENOL) tablet 650 mg  650 mg Oral Q6H PRN Shuvon Rankin, NP      . alum & mag hydroxide-simeth (MAALOX/MYLANTA) 200-200-20 MG/5ML suspension 30 mL  30 mL Oral Q4H PRN Shuvon Rankin, NP      . FLUoxetine (PROZAC) capsule 20 mg  20 mg Oral Daily Shuvon Rankin, NP   20 mg at 07/08/13 7829  . magnesium hydroxide (MILK OF MAGNESIA) suspension 30 mL  30 mL Oral Daily PRN Shuvon Rankin, NP        Lab Results: No results found for this or any previous visit (from the past 48 hour(s)).  Physical Findings: AIMS: Facial and Oral Movements Muscles of Facial Expression: None, normal Lips and Perioral Area: None, normal Jaw: None, normal Tongue: None, normal,Extremity Movements Upper (arms, wrists, hands, fingers): None, normal Lower (legs, knees, ankles, toes): None, normal, Trunk Movements Neck, shoulders, hips: None, normal, Overall Severity Severity of abnormal movements (highest score from questions above): None, normal Incapacitation due to abnormal movements: None, normal Patient's awareness of abnormal movements (rate only patient's  report): No Awareness, Dental Status Current problems with teeth and/or dentures?: No Does patient usually wear dentures?: No  CIWA:  CIWA-Ar Total: 4 COWS:  COWS Total Score: 2  Treatment Plan Summary: Daily contact with patient to assess and evaluate symptoms and progress in treatment Medication management  Plan:  Review of chart, vital signs, medications, and notes. 1-Individual and group therapy 2-Medication  management for depression and anxiety:  Medications reviewed with the patient and Trazodone ordered for sleep 3-Coping skills for depression, anxiety 4-Continue crisis stabilization and management 5-Address health issues--monitoring vital signs, stable 6-Treatment plan in progress to prevent relapse of depression and anxiety  Medical Decision Making Problem Points:  Established problem, stable/improving (1) and Review of psycho-social stressors (1) Data Points:  Review of new medications or change in dosage (2)  I certify that inpatient services furnished can reasonably be expected to improve the patient's condition.   Nanine Means, PMH-NP 07/08/2013, 2:03 PM

## 2013-07-09 ENCOUNTER — Telehealth (HOSPITAL_COMMUNITY): Payer: Self-pay | Admitting: Emergency Medicine

## 2013-07-09 NOTE — ED Notes (Signed)
Joanna Osborn, patient's RN, notified of +urine and need for treatment and need to notify MD of need for treatment.

## 2013-07-09 NOTE — Progress Notes (Signed)
[facility] MD Progress Note  07/09/2013 10:46 AM TYLEE YUM  MRN:  [id number] Subjective:  Patient continues to focus on the negative--redirection attempted, asked to name 3 positive things prior to falling asleep to assist also.  Sleep was "alright", appetite is "Ok", depression remains and is hoping her medications "kick in" because she feels group therapy is not helping her much--wants a one-one therapist, encouragement for coping skills given, states she "just wants to go home (to her house)". Diagnosis:   DSM5:  Depressive Disorders:  Major Depressive Disorder - Severe (296.23)  Axis I: Major Depression, Recurrent severe Axis II: Deferred Axis III:  Past Medical History  Diagnosis Date  . Kidney stone   . Anxiety   . Depression    Axis IV: other psychosocial or environmental problems, problems related to social environment and problems with primary support group Axis V: 41-50 serious symptoms  ADL's:  Intact  Sleep: Fair  Appetite:  Fair  Suicidal Ideation:  Denies  Homicidal Ideation:  Denies   Psychiatric Specialty Exam: Review of Systems  Constitutional: Negative.   HENT: Negative.   Eyes: Negative.   Respiratory: Negative.   Cardiovascular: Negative.   Gastrointestinal: Negative.   Genitourinary: Negative.   Musculoskeletal: Negative.   Skin: Negative.   Neurological: Negative.   Endo/Heme/Allergies: Negative.   Psychiatric/Behavioral: Positive for depression.    Blood pressure 111/80, pulse 92, temperature 98 F (36.7 C), temperature source Oral, resp. rate 18, height 5' 5.5" (1.664 m), weight 90.719 kg (200 lb), last menstrual period 05/10/2013.Body mass index is 32.76 kg/(m^2).  General Appearance: Casual  Eye Contact::  Fair  Speech:  Normal Rate  Volume:  Normal  Mood:  Depressed  Affect:  Congruent  Thought Process:  Coherent  Orientation:  Full (Time, Place, and Person)  Thought Content:  WDL  Suicidal Thoughts:  No  Homicidal Thoughts:  No   Memory:  Immediate;   Fair Recent;   Fair Remote;   Fair  Judgement:  Fair  Insight:  Lacking  Psychomotor Activity:  Decreased  Concentration:  Fair  Recall:  Fair  Akathisia:  No  Handed:  Right  AIMS (if indicated):     Assets:  Leisure Time Physical Health Resilience Social Support  Sleep:  Number of Hours: 6   Current Medications: Current Facility-Administered Medications  Medication Dose Route Frequency Provider Last Rate Last Dose  . acetaminophen (TYLENOL) tablet 650 mg  650 mg Oral Q6H PRN Shuvon Rankin, NP      . alum & mag hydroxide-simeth (MAALOX/MYLANTA) 200-200-20 MG/5ML suspension 30 mL  30 mL Oral Q4H PRN Shuvon Rankin, NP      . FLUoxetine (PROZAC) capsule 20 mg  20 mg Oral Daily Shuvon Rankin, NP   20 mg at 07/09/13 0827  . magnesium hydroxide (MILK OF MAGNESIA) suspension 30 mL  30 mL Oral Daily PRN Shuvon Rankin, NP        Lab Results: No results found for this or any previous visit (from the past 48 hour(s)).  Physical Findings: AIMS: Facial and Oral Movements Muscles of Facial Expression: None, normal Lips and Perioral Area: None, normal Jaw: None, normal Tongue: None, normal,Extremity Movements Upper (arms, wrists, hands, fingers): None, normal Lower (legs, knees, ankles, toes): None, normal, Trunk Movements Neck, shoulders, hips: None, normal, Overall Severity Severity of abnormal movements (highest score from questions above): None, normal Incapacitation due to abnormal movements: None, normal Patient's awareness of abnormal movements (rate only patient's report): No Awareness, Dental Status Current  problems with teeth and/or dentures?: No Does patient usually wear dentures?: No  CIWA:  CIWA-Ar Total: 4 COWS:  COWS Total Score: 2  Treatment Plan Summary: Daily contact with patient to assess and evaluate symptoms and progress in treatment Medication management  Plan:  Review of chart, vital signs, medications, and notes. 1-Individual and  group therapy 2-Medication management for depression and anxiety:  Medications reviewed with the patient and she stated no negative side effects, no changes made 3-Coping skills for depression, anxiety 4-Continue crisis stabilization and management 5-Address health issues--monitoring vital signs, stable 6-Treatment plan in progress to prevent relapse of depression and anxiety  Medical Decision Making Problem Points:  Established problem, stable/improving (1) and Review of psycho-social stressors (1) Data Points:  Review of medication regiment & side effects (2)  I certify that inpatient services furnished can reasonably be expected to improve the patient's condition.   Nanine Means, PMH-NP 07/09/2013, 10:46 AM

## 2013-07-09 NOTE — Progress Notes (Signed)
Psychoeducational Group Note  Date: 07/09/2013 Time:0930  Group Topic/Focus:  Gratefulness:  The focus of this group is to help patients identify what two things they are most grateful for in their lives. What helps ground them and to center them on their work to their recovery.  Participation Level:  Active  Participation Quality:  Appropriate  Affect:  Appropriate  Cognitive:  Appropriate  Insight:  Improving  Engagement in Group:  Engaged  Additional Comments:    Joanna Osborn   

## 2013-07-09 NOTE — Progress Notes (Signed)
Patient ID: Joanna Osborn, female   DOB: 1971-06-02, 42 y.o.   MRN: [id number]  D: Patient pleasant and cooperative with staff. Pt interacting well with peers on unit. Mood improving today. A: Q 15 minute safety checks, encourage staff/peer interaction, group participation and medication compliance. R: Pt denies SI or plans to harm herself at this time.

## 2013-07-09 NOTE — Progress Notes (Signed)
  D: Pt observed sleeping in bed with eyes closed. RR even and unlabored. No distress noted  .  A: Q 15 minute checks were done for safety.  R: safety maintained on unit.  

## 2013-07-09 NOTE — Progress Notes (Signed)
Adult Psychoeducational Group Note  Date:  07/09/2013 Time:  9:39 PM  Group Topic/Focus:  Wrap-Up Group:   The focus of this group is to help patients review their daily goal of treatment and discuss progress on daily workbooks.  Participation Level:  Active  Participation Quality:  Attentive, Sharing and Supportive  Affect:  Appropriate  Cognitive:  Appropriate  Insight: Appropriate  Engagement in Group:  Engaged and Supportive  Modes of Intervention:  Discussion and Support    Joanna Osborn 07/09/2013, 9:39 PM

## 2013-07-09 NOTE — [facility] Group Notes (Signed)
[facility] Group Notes:  (Clinical Social Work)  07/09/2013   1:15-2:20PM  Summary of Progress/Problems:  The main focus of today's process group was to   identify the patient's current support system and decide on other supports that can be put in place.  The picture on workbook was used to discuss why additional supports are needed.  An emphasis was placed on using counselor, doctor, therapy groups, 12-step groups, and problem-specific support groups to expand supports.   There was also an extensive discussion about what constitutes a healthy support versus an unhealthy support.  The patient expressed full comprehension of the concepts presented, and agreed that there is a need to add more supports.  The patient reported a willingness to add a counselor, and get her primary care physician to prescribe the Prozac.  Type of Therapy:  Process Group  Participation Level:  Active  Participation Quality:  Attentive and Sharing  Affect:  Blunted and Depressed  Cognitive:  Appropriate and Oriented  Insight:  Engaged  Engagement in Therapy:  Engaged  Modes of Intervention:  Education,  Support and [employer], LCSW 07/09/2013, 4:00pm

## 2013-07-09 NOTE — Progress Notes (Signed)
Psychoeducational Group Note  Date:  07/09/2013 Time:  1015  Group Topic/Focus:  Making Healthy Choices:   The focus of this group is to help patients identify negative/unhealthy choices they were using prior to admission and identify positive/healthier coping strategies to replace them upon discharge.  Participation Level:  Active  Participation Quality:  Appropriate  Affect:  Appropriate  Cognitive:  Oriented  Insight:  Improving  Engagement in Group:  Engaged  Additional Comments:    Joanna Osborn A 07/09/2013  

## 2013-07-09 NOTE — ED Notes (Signed)
Post ED Visit - Positive Culture Follow-up: Successful Patient Follow-Up  Culture assessed and recommendations reviewed by: []  Wes Dulaney, Pharm.D., BCPS []  Celedonio Miyamoto, [address].D., BCPS []  Georgina Pillion, [address].D., BCPS []  [place], [address].D., BCPS, AAHIVP []  Estella Husk, Pharm.D., BCPS, AAHIVP [x]  Louie Casa, [address].D., BCPS  Positive urine culture  [x]  Patient discharged without antimicrobial prescription and treatment is now indicated []  Organism is resistant to prescribed ED discharge antimicrobial []  Patient with positive blood cultures  Changes discussed with ED provider: [facility] New antibiotic prescription: Cipro 500 mg PO BID x 3 days  Spoke with Thayer Ohm, patient's nurse, and informed of +urine and need for MD to treat.    Kylie A Holland 07/09/2013, 4:20 PM

## 2013-07-10 NOTE — [facility] Group Notes (Signed)
[facility] LCSW Group Therapy          Overcoming Obstacles       1:15 -2:30        07/10/2013   3:32 PM     Type of Therapy:  Group Therapy  Participation Level:  Appropriate  Participation Quality:  Appropriate  Affect:  Appropriate, Alert  Cognitive:  Attentive Appropriate  Insight: Developing/Improving Engaged  Engagement in Therapy: Developing/Imprvoing Engaged  Modes of Intervention:  Discussion Exploration  Education Rapport BuildingProblem-Solving Support  Summary of Progress/Problems:  The main focus of today's group was overcoming obstacles. 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  Patient was able to identify appropriate coping skills.   [name] 07/10/2013    3:32 PM

## 2013-07-10 NOTE — Progress Notes (Signed)
D:  Per pt self inventory pt reports sleeping well, appetite good, energy level low, ability to pay attention good, rates depression at an 8 out of 10 and hopelessness at a 6 out of 10, denies SI/HI/AVH     A:  Emotional support provided, Encouraged pt to continue with treatment plan and attend all group activities, q15 min checks maintained for safety.  R:  Pt is receptive, calm cooperative and going to groups.

## 2013-07-10 NOTE — [facility] Group Notes (Signed)
[facility] LCSW Aftercare Discharge Planning Group Note   07/10/2013 1:09 PM    Participation Quality:  Appropraite  Mood/Affect:  Appropriate  Depression Rating:  8  Anxiety Rating:  0  Thoughts of Suicide:  No  Will you contract for safety?   NA  Current AVH:  No  Plan for Discharge/Comments:  Patient attended discharge planning group and actively participated in group.  She requested follow up with [facility].  CSW provided all participants with daily workbook.   Transportation Means: Patient has transportation.   Supports:  Patient has a support system.   Ashawn Rinehart, Joesph July

## 2013-07-10 NOTE — [facility] Suicide Risk Assessment (Signed)
[facility] INPATIENT:  Family/Significant Other Suicide Prevention Education  Suicide Prevention Education:  Education Completed; Ikesha Siller, Husband, [phone]; has been identified by the patient as the family member/significant other with whom the patient will be residing, and identified as the person(s) who will aid the patient in the event of a mental health crisis (suicidal ideations/suicide attempt).  With written consent from the patient, the family member/significant other has been provided the following suicide prevention education, prior to the and/or following the discharge of the patient.  The suicide prevention education provided includes the following:  Suicide risk factors  Suicide prevention and interventions  National Suicide Hotline telephone number  [facility] assessment telephone number  [facility] Emergency Assistance 911  [facility] and/or Residential Mobile Crisis Unit telephone number  Request made of family/significant other to:  Remove weapons (e.g., guns, rifles, knives), all items previously/currently identified as safety concern.  Husband advised patient does not have access to weapons.    Remove drugs/medications (over-the-counter, prescriptions, illicit drugs), all items previously/currently identified as a safety concern.  The family member/significant other verbalizes understanding of the suicide prevention education information provided.  The family member/significant other agrees to remove the items of safety concern listed above.  [name] 07/10/2013, 3:50 PM

## 2013-07-10 NOTE — Progress Notes (Signed)
Recreation Therapy Notes  Date: 12.15.2014 Time: 3:00pm Location: [address] Dayroom   Group Topic: Communication, Team Building, Problem Solving  Goal Area(s) Addresses:  Patient will effectively work with peer towards shared goal.  Patient will identify skill used to make activity successful.  Patient will identify how skills used during activity can be used to reach post d/c goals.   Behavioral Response: Appropriate   Intervention: Problem Solving Activitiy  Activity: Life Boat. Patients were given a scenario about being on a sinking yacht. Patients were informed the yacht included 15 guest, 8 of which could be placed on the life boat, along with all group members. Individuals on guest list were of varying socioeconomic classes such as a Education officer, museum, Materials engineer, Midwife, Tree surgeon.   Education: Pharmacist, community, Discharge Planning    Education Outcome: Acknowledges understanding  Clinical Observations/Feedback: Patient arrived late to group session. Upon arrival patient assumed role of observer, making no suggestions or contributions to group discussion. Patient did appear to actively listen to group discussion, as she maintained appropriate eye contact with speaker.    Marykay Lex Juddson Cobern, LRT/CTRS  Jarryd Gratz L 07/10/2013 4:20 PM

## 2013-07-10 NOTE — Progress Notes (Signed)
Patient ID: Joanna Osborn, female   DOB: 04/19/1971, 42 y.o.   MRN: [id number] [facility] MD Progress Note  07/10/2013 3:33 PM Joanna Osborn  MRN:  [id number]  Subjective: Met with the patient today she was resting in bed at 3 pm. She states she is doing well, eating well, sleeping well. She reported no new symptoms. States her depression is down to 5/10 down from a 9 on admission and she has no anxiety. No SI/HI.  Diagnosis:   DSM5:  Depressive Disorders:  Major Depressive Disorder - Severe (296.23)  Axis I: Major Depression, Recurrent severe Axis II: Deferred Axis III:  Past Medical History  Diagnosis Date  . Kidney stone   . Anxiety   . Depression    Axis IV: other psychosocial or environmental problems, problems related to social environment and problems with primary support group Axis V: 41-50 serious symptoms  ADL's:  Intact  Sleep: Fair  Appetite:  Fair  Suicidal Ideation:  Denies  Homicidal Ideation:  Denies   Psychiatric Specialty Exam: Review of Systems  Constitutional: Negative.   HENT: Negative.   Eyes: Negative.   Respiratory: Negative.   Cardiovascular: Negative.   Gastrointestinal: Negative.   Genitourinary: Negative.   Musculoskeletal: Negative.   Skin: Negative.   Neurological: Negative.   Endo/Heme/Allergies: Negative.   Psychiatric/Behavioral: Positive for depression.    Blood pressure 113/82, pulse 96, temperature 98 F (36.7 C), temperature source Oral, resp. rate 18, height 5' 5.5" (1.664 m), weight 90.719 kg (200 lb), last menstrual period 05/10/2013.Body mass index is 32.76 kg/(m^2).  General Appearance: Casual  Eye Contact::  Fair  Speech:  Normal Rate  Volume:  Normal  Mood:  Depressed  Affect:  Congruent  Thought Process:  Coherent  Orientation:  Full (Time, Place, and Person)  Thought Content:  WDL  Suicidal Thoughts:  No  Homicidal Thoughts:  No  Memory:  Immediate;   Fair Recent;   Fair Remote;   Fair  Judgement:  Fair  Insight:   Lacking  Psychomotor Activity:  Decreased  Concentration:  Fair  Recall:  Fair  Akathisia:  No  Handed:  Right  AIMS (if indicated):     Assets:  Leisure Time Physical Health Resilience Social Support  Sleep:  Number of Hours: 6   Current Medications: Current Facility-Administered Medications  Medication Dose Route Frequency Provider Last Rate Last Dose  . acetaminophen (TYLENOL) tablet 650 mg  650 mg Oral Q6H PRN Shuvon Rankin, NP   650 mg at 07/09/13 2155  . alum & mag hydroxide-simeth (MAALOX/MYLANTA) 200-200-20 MG/5ML suspension 30 mL  30 mL Oral Q4H PRN Shuvon Rankin, NP      . FLUoxetine (PROZAC) capsule 20 mg  20 mg Oral Daily Shuvon Rankin, NP   20 mg at 07/10/13 0756  . magnesium hydroxide (MILK OF MAGNESIA) suspension 30 mL  30 mL Oral Daily PRN Shuvon Rankin, NP        Lab Results: No results found for this or any previous visit (from the past 48 hour(s)).  Physical Findings: AIMS: Facial and Oral Movements Muscles of Facial Expression: None, normal Lips and Perioral Area: None, normal Jaw: None, normal Tongue: None, normal,Extremity Movements Upper (arms, wrists, hands, fingers): None, normal Lower (legs, knees, ankles, toes): None, normal, Trunk Movements Neck, shoulders, hips: None, normal, Overall Severity Severity of abnormal movements (highest score from questions above): None, normal Incapacitation due to abnormal movements: None, normal Patient's awareness of abnormal movements (rate only patient's report): No Awareness,  Dental Status Current problems with teeth and/or dentures?: No Does patient usually wear dentures?: No  CIWA:  CIWA-Ar Total: 4 COWS:  COWS Total Score: 2  Treatment Plan Summary: Daily contact with patient to assess and evaluate symptoms and progress in treatment Medication management  Plan:  Review of chart, vital signs, medications, and notes. 1. No changes at this time. 2. Disposition in progress.    Medical Decision  Making Problem Points:  Established problem, stable/improving (1) and Review of psycho-social stressors (1) Data Points:  Review of medication regiment & side effects (2)  I certify that inpatient services furnished can reasonably be expected to improve the patient's condition.   Rona Ravens. Mashburn RPAC 8:29 PM 07/10/2013  Reviewed the information documented and agree with the treatment plan.  Francess Mullen,JANARDHAHA R. 07/11/2013 12:52 PM

## 2013-07-10 NOTE — Tx Team (Signed)
Interdisciplinary Treatment Plan Update   Date Reviewed:  07/10/2013  Time Reviewed:  9:44 AM  Progress in Treatment:   Attending groups: Yes Participating in groups: Yes Taking medication as prescribed: Yes  Tolerating medication: Yes Family/Significant other contact made: Yes  Patient understands diagnosis: Yes  Discussing patient identified problems/goals with staff: Yes Medical problems stabilized or resolved: Yes Denies suicidal/homicidal ideation: Yes Patient has not harmed self or others: Yes  For review of initial/current patient goals, please see plan of care.  Estimated Length of Stay: 2-3 days  Reasons for Continued Hospitalization:  Anxiety Depression Medication stabilization Suicidal ideation  (Passive)  New Problems/Goals identified:    Discharge Plan or Barriers:   Home with outpatient follow up to be scheduled with [facility]  Additional Comments:  Continue medication stabilization  Attendees:  Patient:  07/10/2013 9:44 AM   Signature: Mervyn Gay, MD 07/10/2013 9:44 AM  Signature:  Verne Spurr, PA 07/10/2013 9:44 AM  Signature:  Neill Loft, RN 07/10/2013 9:44 AM  Signature:  Elliot Cousin,  RN 07/10/2013 9:44 AM  Signature:   07/10/2013 9:44 AM  Signature:  Juline Patch, LCSW 07/10/2013 9:44 AM  Signature:  Reyes Ivan, LCSW 07/10/2013 9:44 AM  Signature:  07/10/2013 9:44 AM  Signature:  Aloha Gell, RN 07/10/2013 9:44 AM  Signature:  07/10/2013  9:44 AM  Signature:   Onnie Boer, RN [facility] 07/10/2013  9:44 AM  Signature:  07/10/2013  9:44 AM    Scribe for Treatment Team:   Juline Patch,  07/10/2013 9:44 AM

## 2013-07-11 MED ORDER — FLUOXETINE HCL 10 MG PO CAPS
30.0000 mg | ORAL_CAPSULE | Freq: Every day | ORAL | Status: DC
  Administered 2013-07-12: 30 mg via ORAL
  Filled 2013-07-11 (×3): qty 3
  Filled 2013-07-11: qty 9

## 2013-07-11 MED ORDER — TRAZODONE HCL 50 MG PO TABS
50.0000 mg | ORAL_TABLET | Freq: Every day | ORAL | Status: DC
  Filled 2013-07-11: qty 1
  Filled 2013-07-11: qty 3
  Filled 2013-07-11 (×2): qty 1

## 2013-07-11 NOTE — Progress Notes (Signed)
Adult Psychoeducational Group Note  Date:  07/11/2013 Time:  2:55 AM  Group Topic/Focus:  Wrap-Up Group:   The focus of this group is to help patients review their daily goal of treatment and discuss progress on daily workbooks.  Participation Level:  Active  Participation Quality:  Appropriate  Affect:  Appropriate  Cognitive:  Appropriate  Insight: Appropriate  Engagement in Group:  Engaged  Modes of Intervention:  Support  Additional Comments:  Pt stated that she got a lot from the group and that it is a great start to her treatment with what she learned.   Reah Justo 07/11/2013, 2:55 AM

## 2013-07-11 NOTE — Progress Notes (Signed)
Patient ID: Joanna Osborn, female   DOB: 1970/09/05, 42 y.o.   MRN: [id number] D- Patient reports fair sleep and good appetite.  Her energy level is low and her ability to pay attention is good.  She is rating depression and hopelessness at 3.  She denies thoughts of self harm at this time.  A- Supported patient.  R- Patient is attending all groups and interacting with peers and staff.

## 2013-07-11 NOTE — Progress Notes (Signed)
[facility] MD Progress Note  07/11/2013 1:18 PM Joanna Osborn  MRN:  [id number]  Subjective: Patient stated that she has been depressed for a long time and that if this is not getting better. Patient stated that she has been attending groups and they are fine but still feels she'll be better off being at home and working. Patient continued to report significant symptoms of her depression, isolation, lack of interest or motivation and poor socialization. Patient has disturbance of sleep and appetite. Case manager has been in contact with her family who seems like asking her to get different kind of help. Patient has been minimizing her symptoms with the intention to be discharged at this time. She  Rates depression is 5/10 and no anxiety at this time. Patient contracts for safety in the hospital.   Diagnosis:   DSM5:  Depressive Disorders:  Major Depressive Disorder - Severe (296.23)  Axis I: Major Depression, Recurrent severe Axis II: Deferred Axis III:  Past Medical History  Diagnosis Date  . Kidney stone   . Anxiety   . Depression    Axis IV: other psychosocial or environmental problems, problems related to social environment and problems with primary support group Axis V: 41-50 serious symptoms  ADL's:  Intact  Sleep: Fair  Appetite:  Fair  Suicidal Ideation:  Denies  Homicidal Ideation:  Denies   Psychiatric Specialty Exam: Review of Systems  Constitutional: Negative.   HENT: Negative.   Eyes: Negative.   Respiratory: Negative.   Cardiovascular: Negative.   Gastrointestinal: Negative.   Genitourinary: Negative.   Musculoskeletal: Negative.   Skin: Negative.   Neurological: Negative.   Endo/Heme/Allergies: Negative.   Psychiatric/Behavioral: Positive for depression.    Blood pressure 111/79, pulse 94, temperature 97.8 F (36.6 C), temperature source Oral, resp. rate 18, height 5' 5.5" (1.664 m), weight 90.719 kg (200 lb), last menstrual period 05/10/2013.Body mass index  is 32.76 kg/(m^2).  General Appearance: Casual  Eye Contact::  Fair  Speech:  Normal Rate  Volume:  Normal  Mood:  Depressed  Affect:  Congruent  Thought Process:  Coherent  Orientation:  Full (Time, Place, and Person)  Thought Content:  WDL  Suicidal Thoughts:  No  Homicidal Thoughts:  No  Memory:  Immediate;   Fair Recent;   Fair Remote;   Fair  Judgement:  Fair  Insight:  Lacking  Psychomotor Activity:  Decreased  Concentration:  Fair  Recall:  Fair  Akathisia:  No  Handed:  Right  AIMS (if indicated):     Assets:  Leisure Time Physical Health Resilience Social Support  Sleep:  Number of Hours: 6   Current Medications: Current Facility-Administered Medications  Medication Dose Route Frequency Provider Last Rate Last Dose  . acetaminophen (TYLENOL) tablet 650 mg  650 mg Oral Q6H PRN Shuvon Rankin, NP   650 mg at 07/09/13 2155  . alum & mag hydroxide-simeth (MAALOX/MYLANTA) 200-200-20 MG/5ML suspension 30 mL  30 mL Oral Q4H PRN Shuvon Rankin, NP      . [START ON 07/12/2013] FLUoxetine (PROZAC) capsule 30 mg  30 mg Oral Daily Nehemiah Settle, MD      . magnesium hydroxide (MILK OF MAGNESIA) suspension 30 mL  30 mL Oral Daily PRN Shuvon Rankin, NP      . traZODone (DESYREL) tablet 50 mg  50 mg Oral QHS Nehemiah Settle, MD        Lab Results: No results found for this or any previous visit (from the past 48  hour(s)).  Physical Findings: AIMS: Facial and Oral Movements Muscles of Facial Expression: None, normal Lips and Perioral Area: None, normal Jaw: None, normal Tongue: None, normal,Extremity Movements Upper (arms, wrists, hands, fingers): None, normal Lower (legs, knees, ankles, toes): None, normal, Trunk Movements Neck, shoulders, hips: None, normal, Overall Severity Severity of abnormal movements (highest score from questions above): None, normal Incapacitation due to abnormal movements: None, normal Patient's awareness of abnormal movements  (rate only patient's report): No Awareness, Dental Status Current problems with teeth and/or dentures?: No Does patient usually wear dentures?: No  CIWA:  CIWA-Ar Total: 4 COWS:  COWS Total Score: 2  Treatment Plan Summary: Daily contact with patient to assess and evaluate symptoms and progress in treatment Medication management  Plan:  Review of chart, vital signs, medications, and notes. 1.  Increase Fluoxetine 30 mg daily for controlling symptoms of depression 2. Start Trazodone 50 mg at bedtime for better sleep  3. Disposition in progress, patient may be discharged in one to 2 days when she can be less depressed and more safe and contracts for safety.    Medical Decision Making Problem Points:  Established problem, stable/improving (1) and Review of psycho-social stressors (1) Data Points:  Review of medication regiment & side effects (2)  I certify that inpatient services furnished can reasonably be expected to improve the patient's condition.    Nehemiah Settle., M.D.  07/11/2013 1:18 PM

## 2013-07-11 NOTE — Progress Notes (Signed)
The focus of this group is to educate the patient on the purpose and policies of crisis stabilization and provide a format to answer questions about their admission.  The group details unit policies and expectations of patients while admitted. Attended group on time and was an active participant in exercises.  She was an active listener to discussion about group modality , crisis stabilization and 1:1 treatment.

## 2013-07-11 NOTE — Progress Notes (Signed)
D: Pt voiced no concerns she wished for this writer to address at this time. Pt and Clinical research associate did have a discussion on the expectations of care here at [facility]. Pt was informed of the implications of acute hospitalization and the proposed follow-up to follow. Pt was receptive and was able to verbalize her understanding. She was previously upset for false expectations not met.  A: Writer administered scheduled and prn medications to pt. Continued support and availability as needed was extended to this pt. Staff continue to monitor pt with q56min checks.  R: No adverse drug reactions noted. Pt receptive to treatment. Pt remains safe at this time.

## 2013-07-11 NOTE — [facility] Group Notes (Signed)
[facility] LCSW Group Therapy      Feelings About Diagnosis 1:15 - 2:30 PM         07/11/2013  3:18 PM     Type of Therapy:  Group Therapy  Participation Level:  Active  Participation Quality:  Appropriate  Affect:  Appropriate  Cognitive:  Alert and Appropriate  Insight:  Developing/Improving and Engaged  Engagement in Therapy:  Developing/Improving and Engaged  Modes of Intervention:  Discussion, Education, Exploration, Problem-Solving, Rapport Building, Support  Summary of Progress/Problems:  Patient actively participated in group. Patient discussed past and present diagnosis and the effects it has had on  life.  Patient talked about family and society being judgmental and the stigma associated with having a mental health diagnosis.  [name] 07/11/2013 3:18 PM

## 2013-07-12 MED ORDER — TRAZODONE HCL 50 MG PO TABS: 50.0000 mg | ORAL_TABLET | Freq: Every day | ORAL | Status: DC

## 2013-07-12 MED ORDER — FLUOXETINE HCL 10 MG PO CAPS: 30.0000 mg | ORAL_CAPSULE | Freq: Every day | ORAL | Status: DC

## 2013-07-12 NOTE — Progress Notes (Signed)
Patient ID: Joanna Osborn, female   DOB: 1970-08-28, 42 y.o.   MRN: [id number]  Pt. Denies SI/HI and A/V hallucinations. Patient rated her depression at a 2/10 today and her hopelessness at a 1/10 today. Patient reports that after discharge she will continue seeking help. Belongings returned to patient at time of discharge. Patient denies any pain or discomfort. Discharge instructions and medications were reviewed with patient. Patient verbalized understanding of both medications and discharge instructions. Q15 minute safety checks were maintained until discharge. No distress noted upon discharge.

## 2013-07-12 NOTE — Progress Notes (Signed)
D: Pt denies SI/HI/AVH. Pt is pleasant and cooperative. 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  A: Pt was offered support and encouragement. Pt was given scheduled medications. Pt was encourage to attend groups. Q 15 minute checks were done for safety.   R:Pt attends groups and interacts well with peers and staff. Pt is taking medication. .Pt receptive to treatment and safety maintained on unit.

## 2013-07-12 NOTE — [facility] Group Notes (Deleted)
[name] LCSW Aftercare Discharge Planning Group Note   07/12/2013 10:36 AM    Participation Quality:  Appropraite  Mood/Affect:  Appropriate  Depression Rating:  2  Anxiety Rating:  0  Thoughts of Suicide:  No  Will you contract for safety?   NA  Current AVH:  No  Plan for Discharge/Comments:  Patient attended discharge planning group and actively participated in group. She reports doing much better today and being ready to discharge home today. She will follow up with [facility].  CSW provided all participants with daily workbook.   Transportation Means: Patient has transportation.   Supports:  Patient has a support system.   Flordia Kassem, Joesph July

## 2013-07-12 NOTE — [facility] Group Notes (Signed)
[facility] LCSW Aftercare Discharge Planning Group Note   07/12/2013 10:28 AM    Participation Quality:  Appropraite  Mood/Affect:  Appropriate  Depression Rating:  2  Anxiety Rating:  0  Thoughts of Suicide:  No  Will you contract for safety?   NA  Current AVH:  No  Plan for Discharge/Comments:  Patient attended discharge planning group and actively participated in group.  She reports doing better and being ready to discharge home today.  She will follow up with [facility].  CSW provided all participants with daily workbook.   Transportation Means: Patient has transportation.   Supports:  Patient has a support system.   Jacksen Isip, Joesph July

## 2013-07-12 NOTE — [facility] Group Notes (Signed)
Adult Psychoeducational Group Note  Date:  07/12/2013 Time:  12:33 AM  Group Topic/Focus:  Wrap-Up Group:   The focus of this group is to help patients review their daily goal of treatment and discuss progress on daily workbooks.  Participation Level:  Active  Participation Quality:  Appropriate  Affect:  Appropriate  Cognitive:  Appropriate  Insight: Appropriate  Engagement in Group:  Engaged  Modes of Intervention:  Discussion  Additional Comments:  Patient stated she had a good day.  Joanna Osborn Shaunte 07/12/2013, 12:33 AM

## 2013-07-12 NOTE — [facility] Suicide Risk Assessment (Signed)
Suicide Risk Assessment  Discharge Assessment     Demographic Factors:  Adolescent or young adult and Low socioeconomic status  Mental Status Per Nursing Assessment::   On Admission:     Current Mental Status by Physician: Patient is calm, quite and cooperative. Patient has good mood with appropriate bright and full affect. Patient has normal speech and thought process. Patient has denied suicidal, homicidal ideation, intention or plans. Patient has no evidence of psychotic symptoms.  Loss Factors: Financial problems/change in socioeconomic status  Historical Factors: NA  Risk Reduction Factors:   Sense of responsibility to family, Religious beliefs about death, Living with another person, especially a relative, Positive social support, Positive therapeutic relationship and Positive coping skills or problem solving skills  Continued Clinical Symptoms:  Depression:   Recent sense of peace/wellbeing Previous Psychiatric Diagnoses and Treatments  Cognitive Features That Contribute To Risk:  Polarized thinking    Suicide Risk:  Minimal: No identifiable suicidal ideation.  Patients presenting with no risk factors but with morbid ruminations; may be classified as minimal risk based on the severity of the depressive symptoms  Discharge Diagnoses:   AXIS I:  Major Depression, Recurrent severe AXIS II:  Deferred AXIS III:   Past Medical History  Diagnosis Date  . Kidney stone   . Anxiety   . Depression    AXIS IV:  economic problems, other psychosocial or environmental problems, problems related to social environment and problems with primary support group AXIS V:  61-70 mild symptoms  Plan Of Care/Follow-up recommendations:  Activity:  As directed Diet:  Regular  Is patient on multiple antipsychotic therapies at discharge:  No   Has Patient had three or more failed trials of antipsychotic monotherapy by history:  No  Recommended Plan for Multiple Antipsychotic  Therapies: NA  Maryiah Olvey,JANARDHAHA R. 07/12/2013, 12:31 PM

## 2013-07-12 NOTE — Progress Notes (Signed)
[facility] Adult Case Management Discharge Plan :  Will you be returning to the same living situation after discharge: Yes,  Patient to return to her home. At discharge, do you have transportation home?:Yes,  Husband will transport her home Do you have the ability to pay for your medications:Yes,  Patient able to afford medications.  Release of information consent forms completed and in the chart;  Patient's signature needed at discharge.  Patient to Follow up at: Follow-up Information   Follow up with [facility] On 07/17/2013. (Monday, July 17, 2013 at 1:30 PM)    Contact information:   [address] [place], [place]   [address]  [phone]      Patient denies SI/HI:   Patient no longer endorsing SI/HI or other thoughts of self harm.   Safety Planning and Suicide Prevention discussed:  .Reviewed with all patients during discharge planning group   Joanna Osborn, Joesph July 07/12/2013, 10:38 AM

## 2013-07-12 NOTE — Discharge Summary (Signed)
Physician Discharge Summary Note  Patient:  Joanna Osborn is an 42 y.o., female MRN:  [id number] DOB:  1971/05/24 Patient phone:  [phone] (home)  Patient address:   [address] [place] [address],   Date of Admission:  07/06/2013 Date of Discharge: 07/12/2013  Reason for Admission:  MDD with suicidal ideation  Discharge Diagnoses: Principal Problem:   Suicide ideation  ROS  DSM5: Schizophrenia Disorders:  Obsessive-Compulsive Disorders:  Trauma-Stressor Disorders:  Substance/Addictive Disorders:  Depressive Disorders: Major Depressive Disorder - Severe (296.23)  AXIS I: Major Depression, Recurrent severe  AXIS II: Deferred  AXIS III:  Past Medical History   Diagnosis  Date   .  Kidney stone    .  Anxiety    .  Depression     AXIS IV: economic problems, other psychosocial or environmental problems and problems related to social environment  AXIS V: 41-50 serious symptoms  Level of Care:  OP  Hospital Course:        Jami Bogdanski is a 42 year old AAF who presented to the [facility] on urging of her family for evaluation and treatment of her depression and anxiety. She had never been treated for depression or any other psychiatric illness. She had never had any prior psychiatric in patient admissions. Her symptoms were noted to be significant and admission was recommended for safety, crisis management, medication management and stabilization.  She was given medical clearance and transferred to [facility].      Marlia Schewe was admitted to the adult unit where she was evaluated and her symptoms were identified. Medication management was discussed and implemented. She was encouraged to participate in unit programming. Medical problems were identified and treated appropriately. Home medication was restarted as needed.                The patient was evaluated each day by a clinical provider to ascertain the patient's response to treatment.  Improvement was noted by the patient's  report of decreasing symptoms, improved sleep and appetite, affect, medication tolerance, behavior, and participation in unit programming.  She was asked each day to complete a self inventory noting mood, mental status, pain, new symptoms, anxiety and concerns.        Rekia responded well to medication and being in a therapeutic and supportive environment. Positive and appropriate behavior was noted and the patient was motivated for recovery.          Johana worked closely with the treatment team and case manager to develop a discharge plan with appropriate goals. Coping skills, problem solving as well as relaxation therapies were also part of the unit programming.         By the day of discharge Toby was in much improved condition than upon admission.  Symptoms were reported as significantly decreased or resolved completely. The patient denied SI/HI and voiced no AVH. She was motivated to continue taking medication with a goal of continued improvement in mental health.          EILIDH MARCANO was discharged home with a plan to follow up as noted below.  Consults:  None  Significant Diagnostic Studies:  labs: CBC, CMP, UA, UDS, BAL  Discharge Vitals:   Blood pressure 106/72, pulse 102, temperature 97.9 F (36.6 C), temperature source Oral, resp. rate 16, height 5' 5.5" (1.664 m), weight 90.719 kg (200 lb), last menstrual period 05/10/2013. Body mass index is 32.76 kg/(m^2). Lab Results:   No results found for this or  any previous visit (from the past 72 hour(s)).  Physical Findings: AIMS: Facial and Oral Movements Muscles of Facial Expression: None, normal Lips and Perioral Area: None, normal Jaw: None, normal Tongue: None, normal,Extremity Movements Upper (arms, wrists, hands, fingers): None, normal Lower (legs, knees, ankles, toes): None, normal, Trunk Movements Neck, shoulders, hips: None, normal, Overall Severity Severity of abnormal movements (highest score from questions above): None,  normal Incapacitation due to abnormal movements: None, normal Patient's awareness of abnormal movements (rate only patient's report): No Awareness, Dental Status Current problems with teeth and/or dentures?: No Does patient usually wear dentures?: No  CIWA:  CIWA-Ar Total: 4 COWS:  COWS Total Score: 2  Psychiatric Specialty Exam: See Psychiatric Specialty Exam and Suicide Risk Assessment completed by Attending Physician prior to discharge.  Discharge destination:  Home  Is patient on multiple antipsychotic therapies at discharge:  No   Has Patient had three or more failed trials of antipsychotic monotherapy by history:  No  Recommended Plan for Multiple Antipsychotic Therapies: NA  Discharge Orders   Future Orders Complete By Expires    As directed    Diet - low sodium heart healthy  As directed    Discharge instructions  As directed    Comments:     Take all of your medications as directed. Be sure to keep all of your follow up appointments.  If you are unable to keep your follow up appointment, call your Doctor's office to let them know, and reschedule.  Make sure that you have enough medication to last until your appointment. Be sure to get plenty of rest. Going to bed at the same time each night will help. Try to avoid sleeping during the day.  Increase your activity as tolerated. Regular exercise will help you to sleep better and improve your mental health. Eating a heart healthy diet is recommended. Try to avoid salty or fried foods. Be sure to avoid all alcohol and illegal drugs.    As directed    Increase activity slowly  As directed        Medication List       Indication   FLUoxetine 10 MG capsule  Commonly known as:  PROZAC  Take 3 capsules (30 mg total) by mouth daily. For depression.   Indication:  Depression, Major Depressive Disorder     traZODone 50 MG tablet  Commonly known as:  DESYREL  Take 1 tablet (50 mg total) by mouth at bedtime. For insomnia.    Indication:  Trouble Sleeping           Follow-up Information   Follow up with [facility] On 07/17/2013. (Monday, July 17, 2013 at 1:30 PM)    Contact information:   [address] [place], [place]   [address]  [phone]      Follow-up recommendations:   Activities: Resume activity as tolerated. Diet: Heart healthy low sodium diet Tests: Follow up testing will be determined by your out patient provider. Comments:    Total Discharge Time:  Less than 30 minutes.  Signed: Rona Ravens. Mashburn RPAC 12:26 PM 07/12/2013  Patient was seen for psychiatric evaluation, suicide risk assessment and formulated treatment plan. Case was discussed with the treatment team and made the appropriate discharge plans. Reviewed the information documented and agree with the treatment plan.  Giovanni Biby,JANARDHAHA R. 07/12/2013 3:04 PM

## 2013-07-12 NOTE — Tx Team (Signed)
Interdisciplinary Treatment Plan Update   Date Reviewed:  07/12/2013  Time Reviewed:  9:44 AM  Progress in Treatment:   Attending groups: Yes Participating in groups: Yes Taking medication as prescribed: Yes  Tolerating medication: Yes Family/Significant other contact made: Yes, contact made with husband  Patient understands diagnosis: Yes  Discussing patient identified problems/goals with staff: Yes Medical problems stabilized or resolved: Yes Denies suicidal/homicidal ideation: Yes Patient has not harmed self or others: Yes  For review of initial/current patient goals, please see plan of care.  Estimated Length of Stay:  Discharge today  Reasons for Continued Hospitalization:   New Problems/Goals identified:    Discharge Plan or Barriers:   Home with outpatient follow up to be scheduled with [facility]  Additional Comments:  N/A Attendees:  Patient:  Joanna Osborn 07/12/2013 9:44 AM   Signature: Mervyn Gay, MD 07/12/2013 9:44 AM  Signature:   07/12/2013 9:44 AM  Signature:   07/12/2013 9:44 AM  Signature:  Napoleon Form,  RN 07/12/2013 9:44 AM  Signature:   07/12/2013 9:44 AM  Signature:  Juline Patch, LCSW 07/12/2013 9:44 AM  Signature:  Reyes Ivan, LCSW 07/12/2013 9:44 AM  Signature:  Elizbeth Squires, Team Lead [facility] 07/12/2013 9:44 AM  Signature:  Aloha Gell, RN 07/12/2013 9:44 AM  Signature:  07/12/2013  9:44 AM  Signature:   Onnie Boer, RN [facility] 07/12/2013  9:44 AM  Signature:  07/12/2013  9:44 AM    Scribe for Treatment Team:   Juline Patch,  07/12/2013 9:44 AM

## 2013-07-14 NOTE — Progress Notes (Signed)
Patient Discharge Instructions:  After Visit Summary (AVS):   Faxed to:  07/14/13 Discharge Summary Note:   Faxed to:  07/14/13 Psychiatric Admission Assessment Note:   Faxed to:  07/14/13 Suicide Risk Assessment - Discharge Assessment:   Faxed to:  07/14/13 Faxed/Sent to the Next Level Care provider:  07/14/13 Faxed to [facility] @ [phone]  Jerelene Redden, 07/14/2013, 3:51 PM

## 2013-10-14 ENCOUNTER — Emergency Department (HOSPITAL_COMMUNITY)
Admission: EM | Admit: 2013-10-14 | Discharge: 2013-10-14 | Disposition: A | Discharge status: Home / Self Care | Payer: Federal, State, Local not specified - PPO | Source: Home / Self Care | Attending: Family Medicine | Admitting: Family Medicine

## 2013-10-14 ENCOUNTER — Encounter (HOSPITAL_COMMUNITY): Payer: Self-pay | Admitting: Emergency Medicine

## 2013-10-14 DIAGNOSIS — J040 Acute laryngitis: Secondary | ICD-10-CM

## 2013-10-14 LAB — POCT RAPID STREP A: STREPTOCOCCUS, GROUP A SCREEN (DIRECT): NEGATIVE

## 2013-10-14 MED ORDER — IPRATROPIUM BROMIDE 0.06 % NA SOLN: 2.0000 | Freq: Four times a day (QID) | NASAL | Status: DC

## 2013-10-14 MED ORDER — PREDNISONE 10 MG PO TABS: 30.0000 mg | ORAL_TABLET | Freq: Every day | ORAL | Status: DC

## 2013-10-14 NOTE — ED Notes (Signed)
Pt c/o cold sxs onset 4 days Sxs include: ST, HA, facial pressure, b ear fullness Denies f/v/n/d, SOB, wheezing Taking OTC cold meds w/no relief Alert w/no signs of acute distress.

## 2013-10-14 NOTE — ED Provider Notes (Signed)
Joanna Osborn is a 43 y.o. female who presents to Urgent Care today for headache sore throat congestion and hoarse voice. Symptoms present for 3 days. No shortness of breath nausea vomiting or diarrhea. Patient also notes a mild nonproductive cough. She's tried over-the-counter medications which have not been effective. No fevers or chills. No chest pains palpitations or trouble breathing.   Past Medical History  Diagnosis Date  . Kidney stone   . Anxiety   . Depression    History  Substance Use Topics  . Smoking status: Never Smoker   . Smokeless tobacco: Not on file  . Alcohol Use: 0.6 oz/week    1 Glasses of wine per week     Comment: one glass wine twice monthly   ROS as above Medications: No current facility-administered medications for this encounter.   Current Outpatient Prescriptions  Medication Sig Dispense Refill  . FLUoxetine (PROZAC) 10 MG capsule Take 3 capsules (30 mg total) by mouth daily. For depression.  90 capsule  0  . ipratropium (ATROVENT) 0.06 % nasal spray Place 2 sprays into both nostrils 4 (four) times daily.  15 mL  1  . predniSONE (DELTASONE) 10 MG tablet Take 3 tablets (30 mg total) by mouth daily.  15 tablet  0  . traZODone (DESYREL) 50 MG tablet Take 1 tablet (50 mg total) by mouth at bedtime. For insomnia.  30 tablet  0    Exam:  BP 141/95  Pulse 81  Temp(Src) 98.9 F (37.2 C) (Oral)  Resp 16  SpO2 100%  LMP 09/24/2013 Gen: Well NAD HEENT: EOMI,  MMM posterior pharynx with cobblestoning. Normal tympanic membranes bilaterally. hoarse voice Lungs: Normal work of breathing. CTABL Heart: RRR no MRG Abd: NABS, Soft. NT, ND Exts: Brisk capillary refill, warm and well perfused.   Results for orders placed during the hospital encounter of 10/14/13 (from the past 24 hour(s))  POCT RAPID STREP A ([place])     Status: None   Collection Time    10/14/13 12:15 PM      Result Value Ref Range   Streptococcus, Group A Screen (Direct) NEGATIVE   NEGATIVE   No results found.  Assessment and Plan: 43 y.o. female with laryngitis versus sinusitis. Plan treatment with prednisone Atrovent nasal spray and over-the-counter medications. Followup as needed.  Discussed warning signs or symptoms. Please see discharge instructions. Patient expresses understanding.    Gregor Hams, MD 10/14/13 1346

## 2013-10-14 NOTE — Discharge Instructions (Signed)
Thank you for coming in today. Take prednisone and use Atrovent as directed. Use Tylenol as well as needed. Call or go to the emergency room if you get worse, have trouble breathing, have chest pains, or palpitations.   Laryngitis At the top of your windpipe is your voice box. It is the source of your voice. Inside your voice box are 2 bands of muscles called vocal cords. When you breathe, your vocal cords are relaxed and open so that air can get into the lungs. When you decide to say something, these cords come together and vibrate. The sound from these vibrations goes into your throat and comes out through your mouth as sound. Laryngitis is an inflammation of the vocal cords that causes hoarseness, cough, loss of voice, sore throat, and dry throat. Laryngitis can be temporary (acute) or long-term (chronic). Most cases of acute laryngitis improve with time.Chronic laryngitis lasts for more than 3 weeks. CAUSES Laryngitis can often be related to excessive smoking, talking, or yelling, as well as inhalation of toxic fumes and allergies. Acute laryngitis is usually caused by a viral infection, vocal strain, measles or mumps, or bacterial infections. Chronic laryngitis is usually caused by vocal cord strain, vocal cord injury, postnasal drip, growths on the vocal cords, or acid reflux. SYMPTOMS   Cough.  Sore throat.  Dry throat. RISK FACTORS  Respiratory infections.  Exposure to irritating substances, such as cigarette smoke, excessive amounts of alcohol, stomach acids, and workplace chemicals.  Voice trauma, such as vocal cord injury from shouting or speaking too loud. DIAGNOSIS  Your cargiver will perform a physical exam. During the physical exam, your caregiver will examine your throat. The most common sign of laryngitis is hoarseness. Laryngoscopy may be necessary to confirm the diagnosis of this condition. This procedure allows your caregiver to look into the larynx. HOME CARE  INSTRUCTIONS  Drink enough fluids to keep your urine clear or pale yellow.  Rest until you no longer have symptoms or as directed by your caregiver.  Breathe in moist air.  Take all medicine as directed by your caregiver.  Do not smoke.  Talk as little as possible (this includes whispering).  Write on paper instead of talking until your voice is back to normal.  Follow up with your caregiver if your condition has not improved after 10 days. SEEK MEDICAL CARE IF:   You have trouble breathing.  You cough up blood.  You have persistent fever.  You have increasing pain.  You have difficulty swallowing. MAKE SURE YOU:  Understand these instructions.  Will watch your condition.  Will get help right away if you are not doing well or get worse. Document Released: 07/13/2005 Document Revised: 10/05/2011 Document Reviewed: 09/18/2010 [facility] Patient Information 2014 [place], [place].

## 2013-10-16 LAB — CULTURE, GROUP A STREP

## 2013-11-24 ENCOUNTER — Emergency Department (HOSPITAL_COMMUNITY)
Admission: EM | Admit: 2013-11-24 | Discharge: 2013-11-24 | Disposition: A | Discharge status: Home / Self Care | Payer: Federal, State, Local not specified - PPO | Source: Home / Self Care | Attending: Family Medicine | Admitting: Family Medicine

## 2013-11-24 ENCOUNTER — Encounter (HOSPITAL_COMMUNITY): Payer: Self-pay | Admitting: Emergency Medicine

## 2013-11-24 ENCOUNTER — Emergency Department (INDEPENDENT_AMBULATORY_CARE_PROVIDER_SITE_OTHER): Payer: Federal, State, Local not specified - PPO

## 2013-11-24 DIAGNOSIS — J329 Chronic sinusitis, unspecified: Secondary | ICD-10-CM

## 2013-11-24 LAB — POCT RAPID STREP A: Streptococcus, Group A Screen (Direct): NEGATIVE

## 2013-11-24 MED ORDER — AZITHROMYCIN 250 MG PO TABS: 250.0000 mg | ORAL_TABLET | Freq: Every day | ORAL | Status: DC

## 2013-11-24 MED ORDER — PREDNISONE 10 MG PO TABS: 30.0000 mg | ORAL_TABLET | Freq: Every day | ORAL | Status: DC

## 2013-11-24 NOTE — ED Provider Notes (Signed)
Joanna Osborn is a 43 y.o. female who presents to Urgent Care today for sore throat. Patient notes sore throat chills body aches bilateral ear pain and a nonproductive cough. She denies any significant shortness of breath nausea vomiting or diarrhea. Symptoms present for 3 days. Patient has tried cold and cough medications and allergy medications which have not helped.   Past Medical History  Diagnosis Date  . Kidney stone   . Anxiety   . Depression    History  Substance Use Topics  . Smoking status: Never Smoker   . Smokeless tobacco: Not on file  . Alcohol Use: 0.6 oz/week    1 Glasses of wine per week     Comment: one glass wine twice monthly   ROS as above Medications: No current facility-administered medications for this encounter.   Current Outpatient Prescriptions  Medication Sig Dispense Refill  . azithromycin (ZITHROMAX) 250 MG tablet Take 1 tablet (250 mg total) by mouth daily. Take first 2 tablets together, then 1 every day until finished.  6 tablet  0  . FLUoxetine (PROZAC) 10 MG capsule Take 3 capsules (30 mg total) by mouth daily. For depression.  90 capsule  0  . ipratropium (ATROVENT) 0.06 % nasal spray Place 2 sprays into both nostrils 4 (four) times daily.  15 mL  1  . predniSONE (DELTASONE) 10 MG tablet Take 3 tablets (30 mg total) by mouth daily.  15 tablet  0  . traZODone (DESYREL) 50 MG tablet Take 1 tablet (50 mg total) by mouth at bedtime. For insomnia.  30 tablet  0    Exam:  BP 131/88  Pulse 101  Temp(Src) 98.7 F (37.1 C) (Oral)  Resp 96  SpO2 96%  LMP 10/29/2013 Gen: Well NAD HEENT: EOMI,  MMM posterior pharynx with cobblestoning. Tympanic membranes are retracted bilaterally. Lungs: Normal work of breathing. CTABL Heart: RRR no MRG Abd: NABS, Soft. NT, ND Exts: Brisk capillary refill, warm and well perfused.   Results for orders placed during the hospital encounter of 11/24/13 (from the past 24 hour(s))  POCT RAPID STREP A ([place])      Status: None   Collection Time    11/24/13  8:29 AM      Result Value Ref Range   Streptococcus, Group A Screen (Direct) NEGATIVE  NEGATIVE   Dg Chest 2 View  11/24/2013   CLINICAL DATA:  Cough.  EXAM: CHEST  2 VIEW  COMPARISON:  None.  FINDINGS: The heart size and mediastinal contours are within normal limits. Both lungs are clear. No pleural effusion or pneumothorax is noted. The visualized skeletal structures are unremarkable.  IMPRESSION: No acute cardiopulmonary abnormality seen.   Electronically Signed   By: Sabino Dick M.D.   On: 11/24/2013 08:52    Assessment and Plan: 43 y.o. female with viral sinusitis versus allergic sinusitis. Plan to treat with prednisone, Atrovent nasal spray, and over-the-counter oral antihistamine. Use azithromycin if not improving.  Discussed warning signs or symptoms. Please see discharge instructions. Patient expresses understanding.    Gregor Hams, MD 11/24/13 [phone]

## 2013-11-24 NOTE — Discharge Instructions (Signed)
Thank you for coming in today. Take prednisone daily.  Used left over Atrovent nasal spray as needed.  Use oral antihistamines like Allegra, Zyrtec, or Claritin. If not getting better take azithromycin antibiotics. Return as needed. Call or go to the emergency room if you get worse, have trouble breathing, have chest pains, or palpitations.   Sinusitis Sinusitis is redness, soreness, and swelling (inflammation) of the paranasal sinuses. Paranasal sinuses are air pockets within the bones of your face (beneath the eyes, the middle of the forehead, or above the eyes). In healthy paranasal sinuses, mucus is able to drain out, and air is able to circulate through them by way of your nose. However, when your paranasal sinuses are inflamed, mucus and air can become trapped. This can allow bacteria and other germs to grow and cause infection. Sinusitis can develop quickly and last only a short time (acute) or continue over a long period (chronic). Sinusitis that lasts for more than 12 weeks is considered chronic.  CAUSES  Causes of sinusitis include:  Allergies.  Structural abnormalities, such as displacement of the cartilage that separates your nostrils (deviated septum), which can decrease the air flow through your nose and sinuses and affect sinus drainage.  Functional abnormalities, such as when the small hairs (cilia) that line your sinuses and help remove mucus do not work properly or are not present. SYMPTOMS  Symptoms of acute and chronic sinusitis are the same. The primary symptoms are pain and pressure around the affected sinuses. Other symptoms include:  Upper toothache.  Earache.  Headache.  Bad breath.  Decreased sense of smell and taste.  A cough, which worsens when you are lying flat.  Fatigue.  Fever.  Thick drainage from your nose, which often is green and may contain pus (purulent).  Swelling and warmth over the affected sinuses. DIAGNOSIS  Your caregiver will  perform a physical exam. During the exam, your caregiver may:  Look in your nose for signs of abnormal growths in your nostrils (nasal polyps).  Tap over the affected sinus to check for signs of infection.  View the inside of your sinuses (endoscopy) with a special imaging device with a light attached (endoscope), which is inserted into your sinuses. If your caregiver suspects that you have chronic sinusitis, one or more of the following tests may be recommended:  Allergy tests.  Nasal culture A sample of mucus is taken from your nose and sent to a lab and screened for bacteria.  Nasal cytology A sample of mucus is taken from your nose and examined by your caregiver to determine if your sinusitis is related to an allergy. TREATMENT  Most cases of acute sinusitis are related to a viral infection and will resolve on their own within 10 days. Sometimes medicines are prescribed to help relieve symptoms (pain medicine, decongestants, nasal steroid sprays, or saline sprays).  However, for sinusitis related to a bacterial infection, your caregiver will prescribe antibiotic medicines. These are medicines that will help kill the bacteria causing the infection.  Rarely, sinusitis is caused by a fungal infection. In theses cases, your caregiver will prescribe antifungal medicine. For some cases of chronic sinusitis, surgery is needed. Generally, these are cases in which sinusitis recurs more than 3 times per year, despite other treatments. HOME CARE INSTRUCTIONS   Drink plenty of water. Water helps thin the mucus so your sinuses can drain more easily.  Use a humidifier.  Inhale steam 3 to 4 times a day (for example, sit in the  bathroom with the shower running).  Apply a warm, moist washcloth to your face 3 to 4 times a day, or as directed by your caregiver.  Use saline nasal sprays to help moisten and clean your sinuses.  Take over-the-counter or prescription medicines for pain, discomfort, or  fever only as directed by your caregiver. SEEK IMMEDIATE MEDICAL CARE IF:  You have increasing pain or severe headaches.  You have nausea, vomiting, or drowsiness.  You have swelling around your face.  You have vision problems.  You have a stiff neck.  You have difficulty breathing. MAKE SURE YOU:   Understand these instructions.  Will watch your condition.  Will get help right away if you are not doing well or get worse. Document Released: 07/13/2005 Document Revised: 10/05/2011 Document Reviewed: 07/28/2011 [facility] Patient Information 2014 [place], [place].

## 2013-11-24 NOTE — ED Notes (Signed)
Pt c/o sore throat onset ST onset 3 days Sx also include odynophagia, BA, chills, cough Denies f/v/n/d, SOB, wheezing Alert w/no signs of acute distress.

## 2013-11-27 ENCOUNTER — Telehealth (HOSPITAL_COMMUNITY): Payer: Self-pay | Admitting: *Deleted

## 2013-11-27 LAB — CULTURE, GROUP A STREP

## 2013-11-27 MED ORDER — AZITHROMYCIN 500 MG PO TABS: 500.0000 mg | ORAL_TABLET | Freq: Every day | ORAL | Status: DC

## 2013-11-27 NOTE — ED Notes (Signed)
Her throat culture came back positive for strep. She was treated with a Z-Pak, but will need azithromycin 500 mg once daily for 5 days.  Harden Mo, MD 11/27/13 [phone]

## 2013-11-27 NOTE — ED Notes (Addendum)
Throat culture:  Strep beta hemolytic not group A.  Pt. treated with Z-pack. Discussed with Dr. Jake Michaelis.  He said pt. needs 5 days of Amoxicillin. I called pt. and left a message to call.  Call 1. Hanley Seamen Dorthie Santini 11/27/2013

## 2013-11-28 NOTE — ED Notes (Signed)
I called mobile, home numbers and left a message. I called her work number and she was not there today.  Call 2.

## 2013-11-29 MED ORDER — AMOXICILLIN 500 MG PO CAPS: 500.0000 mg | ORAL_CAPSULE | Freq: Three times a day (TID) | ORAL | Status: DC

## 2013-11-29 NOTE — ED Notes (Signed)
Strep culture positve.  Amoxicillin called in.  RN to notify pt.   Gregor Hams, MD 11/29/13 [phone]

## 2013-11-29 NOTE — ED Notes (Signed)
Discussed with Dr. Georgina Snell that Dr. Jake Michaelis had done an additional Rx. of Zithromax on 5/4, but I have not been able to contact pt. yet. He wants me to use his Rx. of Amoxicillin.  I called pt. Pt. verified x 2 and given result.  Pt. notified she needs Amoxicillin x 1 week and where to pick up the Rx.  Pt. voiced understanding. I called the pharmacy to cancel Dr. Viann Shove Rx., so no confusion when pt. comes to pick up Rx. Hanley Seamen Baldomero Mirarchi 11/29/2013

## 2014-01-04 ENCOUNTER — Emergency Department (HOSPITAL_COMMUNITY)
Admission: EM | Admit: 2014-01-04 | Discharge: 2014-01-04 | Disposition: A | Discharge status: Home / Self Care | Payer: Federal, State, Local not specified - PPO | Source: Home / Self Care | Attending: Family Medicine | Admitting: Family Medicine

## 2014-01-04 ENCOUNTER — Encounter (HOSPITAL_COMMUNITY): Payer: Self-pay | Admitting: Emergency Medicine

## 2014-01-04 DIAGNOSIS — J309 Allergic rhinitis, unspecified: Secondary | ICD-10-CM

## 2014-01-04 MED ORDER — BENZONATATE 100 MG PO CAPS: 100.0000 mg | ORAL_CAPSULE | Freq: Three times a day (TID) | ORAL | Status: DC

## 2014-01-04 MED ORDER — FLUTICASONE PROPIONATE 50 MCG/ACT NA SUSP: 2.0000 | Freq: Every day | NASAL | Status: DC

## 2014-01-04 MED ORDER — CETIRIZINE HCL 10 MG PO TABS: 10.0000 mg | ORAL_TABLET | Freq: Every day | ORAL | Status: DC

## 2014-01-04 NOTE — ED Notes (Signed)
C/o has been coughing since Friday,no relief w Claritin, c/o some soreness in chest due to cough . NAD, no one else in home ill

## 2014-01-04 NOTE — ED Provider Notes (Signed)
CSN: [id number]     Arrival date & time 01/04/14  0808 History   First MD Initiated Contact with Patient 01/04/14 0827     Chief Complaint  Patient presents with  . Cough   (Consider location/radiation/quality/duration/timing/severity/associated sxs/prior Treatment) HPI Comments: Limited improvement with Robitussin and Mucinex. No fever/chills. No dyspnea, hemoptysis or chest pain. States she has had an excess of post nasal drainage and this has caused her to lose her voice.  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 female presenting with cough. The history is provided by the patient.  Cough Cough characteristics:  Non-productive Severity:  Moderate Onset quality:  Gradual Duration:  5 days Progression:  Unchanged Chronicity:  New Smoker: no     Past Medical History  Diagnosis Date  . Kidney stone   . Anxiety   . Depression    Past Surgical History  Procedure Laterality Date  . Tubal ligation  1999  . Cholecystectomy  2002  . Wrist surgery     Family History  Problem Relation Age of Onset  . Hypertension Other   . Cancer Other    History  Substance Use Topics  . Smoking status: Never Smoker   . Smokeless tobacco: Not on file  . Alcohol Use: 0.6 oz/week    1 Glasses of wine per week     Comment: one glass wine twice monthly   OB History   Grav Para Term Preterm Abortions TAB SAB Ect Mult Living                 Review of Systems  Respiratory: Positive for cough.   All other systems reviewed and are negative.   Allergies  Review of patient's allergies indicates no known allergies.  Home Medications   Prior to Admission medications   Medication Sig Start Date End Date Taking? Authorizing Provider  amoxicillin (AMOXIL) 500 MG capsule Take 1 capsule (500 mg total) by mouth 3 (three) times daily. 11/29/13  Yes Gregor Hams, MD  azithromycin (ZITHROMAX) 250 MG tablet Take 1 tablet (250 mg total) by mouth daily. Take first 2 tablets together, then  1 every day until finished. 11/24/13  Yes Gregor Hams, MD  azithromycin (ZITHROMAX) 500 MG tablet Take 1 tablet (500 mg total) by mouth daily. 11/27/13  Yes Harden Mo, MD  benzonatate (TESSALON) 100 MG capsule Take 1 capsule (100 mg total) by mouth every 8 (eight) hours. As needed for cough 01/04/14   Lahoma Rocker, PA  cetirizine (ZYRTEC) 10 MG tablet Take 1 tablet (10 mg total) by mouth daily. 01/04/14   Lahoma Rocker, PA  FLUoxetine (PROZAC) 10 MG capsule Take 3 capsules (30 mg total) by mouth daily. For depression. 07/12/13   Nena Polio, PA-C  fluticasone (FLONASE) 50 MCG/ACT nasal spray Place 2 sprays into both nostrils daily. 01/04/14   Lahoma Rocker, PA  ipratropium (ATROVENT) 0.06 % nasal spray Place 2 sprays into both nostrils 4 (four) times daily. 10/14/13   Gregor Hams, MD  predniSONE (DELTASONE) 10 MG tablet Take 3 tablets (30 mg total) by mouth daily. 11/24/13   Gregor Hams, MD  traZODone (DESYREL) 50 MG tablet Take 1 tablet (50 mg total) by mouth at bedtime. For insomnia. 07/12/13   Nena Polio, PA-C   BP 131/92  Pulse 90  Temp(Src) 98.7 F (37.1 C) (Oral)  Resp 14  SpO2 97%  LMP 12/08/2013 Physical Exam  Nursing note and vitals reviewed.  Constitutional: She is oriented to person, place, and time. She appears well-developed and well-nourished. No distress.  HENT:  Head: Normocephalic and atraumatic.  Right Ear: Hearing, tympanic membrane, external ear and ear canal normal.  Left Ear: Hearing, tympanic membrane, external ear and ear canal normal.  Nose: Nose normal.  Mouth/Throat: Uvula is midline, oropharynx is clear and moist and mucous membranes are normal. No oral lesions. No trismus in the jaw.  Eyes: Conjunctivae are normal. No scleral icterus.  Neck: Normal range of motion. Neck supple.  Cardiovascular: Normal rate, regular rhythm and normal heart sounds.   Pulmonary/Chest: Effort normal and breath sounds normal. No respiratory distress. She  has no wheezes.  Musculoskeletal: Normal range of motion.  Lymphadenopathy:    She has no cervical adenopathy.  Neurological: She is alert and oriented to person, place, and time.  Skin: Skin is warm and dry. No rash noted. No erythema.  Psychiatric: She has a normal mood and affect. Her behavior is normal.    ED Course  Procedures (including critical care time) Labs Review Labs Reviewed - No data to display  Imaging Review No results found.   MDM   1. Allergic rhinitis   Zyrtec, Flonase and tessalon as prescribed.    [place], [place] 01/04/14 [phone]

## 2014-01-04 NOTE — Discharge Instructions (Signed)
Allergic Rhinitis Allergic rhinitis is when the mucous membranes in the nose respond to allergens. Allergens are particles in the air that cause your body to have an allergic reaction. This causes you to release allergic antibodies. Through a chain of events, these eventually cause you to release histamine into the blood stream. Although meant to protect the body, it is this release of histamine that causes your discomfort, such as frequent sneezing, congestion, and an itchy, runny nose.  CAUSES  Seasonal allergic rhinitis (hay fever) is caused by pollen allergens that may come from grasses, trees, and weeds. Year-round allergic rhinitis (perennial allergic rhinitis) is caused by allergens such as house dust mites, pet dander, and mold spores.  SYMPTOMS   Nasal stuffiness (congestion).  Itchy, runny nose with sneezing and tearing of the eyes. DIAGNOSIS  Your health care provider can help you determine the allergen or allergens that trigger your symptoms. If you and your health care provider are unable to determine the allergen, skin or blood testing may be used. TREATMENT  Allergic Rhinitis does not have a cure, but it can be controlled by:  Medicines and allergy shots (immunotherapy).  Avoiding the allergen. Hay fever may often be treated with antihistamines in pill or nasal spray forms. Antihistamines block the effects of histamine. There are over-the-counter medicines that may help with nasal congestion and swelling around the eyes. Check with your health care provider before taking or giving this medicine.  If avoiding the allergen or the medicine prescribed do not work, there are many new medicines your health care provider can prescribe. Stronger medicine may be used if initial measures are ineffective. Desensitizing injections can be used if medicine and avoidance does not work. Desensitization is when a patient is given ongoing shots until the body becomes less sensitive to the allergen.  Make sure you follow up with your health care provider if problems continue. HOME CARE INSTRUCTIONS It is not possible to completely avoid allergens, but you can reduce your symptoms by taking steps to limit your exposure to them. It helps to know exactly what you are allergic to so that you can avoid your specific triggers. SEEK MEDICAL CARE IF:   You have a fever.  You develop a cough that does not stop easily (persistent).  You have shortness of breath.  You start wheezing.  Symptoms interfere with normal daily activities. Document Released: 04/07/2001 Document Revised: 05/03/2013 Document Reviewed: 03/20/2013 [employer] Patient Information 2014 [employer].  

## 2014-01-10 NOTE — ED Provider Notes (Signed)
Medical screening examination/treatment/procedure(s) were performed by a resident physician or non-physician practitioner and as the supervising physician I was immediately available for consultation/collaboration.  Lynne Leader, MD    Gregor Hams, MD 01/10/14 [phone]

## 2014-01-29 ENCOUNTER — Emergency Department (HOSPITAL_COMMUNITY): Payer: Federal, State, Local not specified - PPO

## 2014-01-29 ENCOUNTER — Emergency Department (HOSPITAL_COMMUNITY)
Admission: EM | Admit: 2014-01-29 | Discharge: 2014-01-29 | Disposition: A | Discharge status: Home / Self Care | Payer: Federal, State, Local not specified - PPO | Attending: Emergency Medicine | Admitting: Emergency Medicine

## 2014-01-29 ENCOUNTER — Encounter (HOSPITAL_COMMUNITY): Payer: Self-pay | Admitting: Emergency Medicine

## 2014-01-29 DIAGNOSIS — F411 Generalized anxiety disorder: Secondary | ICD-10-CM | POA: Insufficient documentation

## 2014-01-29 DIAGNOSIS — R109 Unspecified abdominal pain: Secondary | ICD-10-CM | POA: Insufficient documentation

## 2014-01-29 DIAGNOSIS — Z79899 Other long term (current) drug therapy: Secondary | ICD-10-CM | POA: Insufficient documentation

## 2014-01-29 DIAGNOSIS — F3289 Other specified depressive episodes: Secondary | ICD-10-CM | POA: Insufficient documentation

## 2014-01-29 DIAGNOSIS — Z792 Long term (current) use of antibiotics: Secondary | ICD-10-CM | POA: Insufficient documentation

## 2014-01-29 DIAGNOSIS — Z87442 Personal history of urinary calculi: Secondary | ICD-10-CM | POA: Insufficient documentation

## 2014-01-29 DIAGNOSIS — Z3202 Encounter for pregnancy test, result negative: Secondary | ICD-10-CM | POA: Insufficient documentation

## 2014-01-29 DIAGNOSIS — F329 Major depressive disorder, single episode, unspecified: Secondary | ICD-10-CM | POA: Insufficient documentation

## 2014-01-29 DIAGNOSIS — D259 Leiomyoma of uterus, unspecified: Secondary | ICD-10-CM

## 2014-01-29 DIAGNOSIS — IMO0002 Reserved for concepts with insufficient information to code with codable children: Secondary | ICD-10-CM | POA: Insufficient documentation

## 2014-01-29 LAB — CBC WITH DIFFERENTIAL/PLATELET
Basophils Absolute: 0 10*3/uL (ref 0.0–0.1)
Basophils Relative: 0 % (ref 0–1)
EOS ABS: 0 10*3/uL (ref 0.0–0.7)
Eosinophils Relative: 1 % (ref 0–5)
HCT: 39.8 % (ref 36.0–46.0)
HEMOGLOBIN: 13 g/dL (ref 12.0–15.0)
LYMPHS ABS: 2.2 10*3/uL (ref 0.7–4.0)
LYMPHS PCT: 35 % (ref 12–46)
MCH: 28.5 pg (ref 26.0–34.0)
MCHC: 32.7 g/dL (ref 30.0–36.0)
MCV: 87.3 fL (ref 78.0–100.0)
MONOS PCT: 5 % (ref 3–12)
Monocytes Absolute: 0.3 10*3/uL (ref 0.1–1.0)
NEUTROS ABS: 3.8 10*3/uL (ref 1.7–7.7)
Neutrophils Relative %: 59 % (ref 43–77)
PLATELETS: 388 10*3/uL (ref 150–400)
RBC: 4.56 MIL/uL (ref 3.87–5.11)
RDW: 12.9 % (ref 11.5–15.5)
WBC: 6.4 10*3/uL (ref 4.0–10.5)

## 2014-01-29 LAB — URINALYSIS, ROUTINE W REFLEX MICROSCOPIC
Bilirubin Urine: NEGATIVE
GLUCOSE, UA: NEGATIVE mg/dL
Hgb urine dipstick: NEGATIVE
Ketones, ur: NEGATIVE mg/dL
Nitrite: NEGATIVE
PROTEIN: NEGATIVE mg/dL
SPECIFIC GRAVITY, URINE: 1.014 (ref 1.005–1.030)
Urobilinogen, UA: 0.2 mg/dL (ref 0.0–1.0)
pH: 5 (ref 5.0–8.0)

## 2014-01-29 LAB — URINE MICROSCOPIC-ADD ON

## 2014-01-29 LAB — POC URINE PREG, ED: Preg Test, Ur: NEGATIVE

## 2014-01-29 LAB — BASIC METABOLIC PANEL
Anion gap: 13 (ref 5–15)
BUN: 14 mg/dL (ref 6–23)
CO2: 24 meq/L (ref 19–32)
Calcium: 9.5 mg/dL (ref 8.4–10.5)
Chloride: 102 mEq/L (ref 96–112)
Creatinine, Ser: 0.64 mg/dL (ref 0.50–1.10)
GFR calc Af Amer: >90 mL/min (ref >90)
Glucose, Bld: 93 mg/dL (ref 70–99)
POTASSIUM: 4 meq/L (ref 3.7–5.3)
Sodium: 139 mEq/L (ref 137–147)

## 2014-01-29 MED ORDER — HYDROCODONE-ACETAMINOPHEN 5-325 MG PO TABS: 2.0000 | ORAL_TABLET | Freq: Four times a day (QID) | ORAL | Status: DC | PRN

## 2014-01-29 MED ORDER — ONDANSETRON HCL 4 MG/2ML IJ SOLN
4.0000 mg | Freq: Once | INTRAMUSCULAR | Status: AC
  Administered 2014-01-29: 4 mg via INTRAVENOUS
  Filled 2014-01-29: qty 2

## 2014-01-29 MED ORDER — MORPHINE SULFATE 4 MG/ML IJ SOLN
4.0000 mg | Freq: Once | INTRAMUSCULAR | Status: AC
  Administered 2014-01-29: 4 mg via INTRAVENOUS
  Filled 2014-01-29: qty 1

## 2014-01-29 MED ORDER — ONDANSETRON 4 MG PO TBDP: 4.0000 mg | ORAL_TABLET | Freq: Three times a day (TID) | ORAL | Status: DC | PRN

## 2014-01-29 NOTE — ED Notes (Signed)
Pt c/o right flank pain x 1 day. Has hx of kidney stones and states this feels the same.

## 2014-01-29 NOTE — ED Notes (Signed)
Patient transported to CT 

## 2014-01-29 NOTE — ED Provider Notes (Signed)
CSN: [id number]     Arrival date & time 01/29/14  0927 History   First MD Initiated Contact with Patient 01/29/14 [phone]     Chief Complaint  Patient presents with  . Flank Pain    right     (Consider location/radiation/quality/duration/timing/severity/associated sxs/prior Treatment) HPI Comments: Patient with a history of Kidney Stones presents today with right flank pain.  She reports that the pain has been present since last evening.  She states that the pain is constant and is worse with palpation.  Pain does not radiate.  She states that the pain feels similar to the pain that she has had with a kidney stone in the past.  She denies any back injury or trauma.  She has not taken anything for pain prior to arrival.  She states that the last kidney stone she had was two years ago.  She reports associated nausea, but denies vomiting.  Denies fever, chills, dysuria, increased urinary frequency, urinary urgency, hematuria, vaginal discharge, or diarrhea.   The history is provided by the patient.    Past Medical History  Diagnosis Date  . Kidney stone   . Anxiety   . Depression    Past Surgical History  Procedure Laterality Date  . Tubal ligation  1999  . Cholecystectomy  2002  . Wrist surgery     Family History  Problem Relation Age of Onset  . Hypertension Other   . Cancer Other    History  Substance Use Topics  . Smoking status: Never Smoker   . Smokeless tobacco: Not on file  . Alcohol Use: 0.6 oz/week    1 Glasses of wine per week     Comment: one glass wine twice monthly   OB History   Grav Para Term Preterm Abortions TAB SAB Ect Mult Living                 Review of Systems  Gastrointestinal: Positive for nausea. Negative for vomiting.  Genitourinary: Positive for flank pain. Negative for dysuria, frequency, hematuria, vaginal discharge and difficulty urinating.  All other systems reviewed and are negative.     Allergies  Review of patient's allergies  indicates no known allergies.  Home Medications   Prior to Admission medications   Medication Sig Start Date End Date Taking? Authorizing Provider  amoxicillin (AMOXIL) 500 MG capsule Take 1 capsule (500 mg total) by mouth 3 (three) times daily. 11/29/13   Gregor Hams, MD  azithromycin (ZITHROMAX) 250 MG tablet Take 1 tablet (250 mg total) by mouth daily. Take first 2 tablets together, then 1 every day until finished. 11/24/13   Gregor Hams, MD  azithromycin (ZITHROMAX) 500 MG tablet Take 1 tablet (500 mg total) by mouth daily. 11/27/13   Harden Mo, MD  benzonatate (TESSALON) 100 MG capsule Take 1 capsule (100 mg total) by mouth every 8 (eight) hours. As needed for cough 01/04/14   Lahoma Rocker, PA  cetirizine (ZYRTEC) 10 MG tablet Take 1 tablet (10 mg total) by mouth daily. 01/04/14   Lahoma Rocker, PA  FLUoxetine (PROZAC) 10 MG capsule Take 3 capsules (30 mg total) by mouth daily. For depression. 07/12/13   Nena Polio, PA-C  fluticasone (FLONASE) 50 MCG/ACT nasal spray Place 2 sprays into both nostrils daily. 01/04/14   Lahoma Rocker, PA  ipratropium (ATROVENT) 0.06 % nasal spray Place 2 sprays into both nostrils 4 (four) times daily. 10/14/13   Gregor Hams, MD  predniSONE ([place])  10 MG tablet Take 3 tablets (30 mg total) by mouth daily. 11/24/13   Gregor Hams, MD  traZODone (DESYREL) 50 MG tablet Take 1 tablet (50 mg total) by mouth at bedtime. For insomnia. 07/12/13   Nena Polio, PA-C   BP 142/95  Pulse 91  Temp(Src) 98.2 F (36.8 C) (Oral)  Resp 20  SpO2 98%  LMP 01/09/2014 Physical Exam  Nursing note and vitals reviewed. Constitutional: She appears well-developed and well-nourished.  HENT:  Head: Normocephalic and atraumatic.  Mouth/Throat: Oropharynx is clear and moist.  Neck: Normal range of motion. Neck supple.  Cardiovascular: Normal rate, regular rhythm and normal heart sounds.   Pulmonary/Chest: Effort normal and breath sounds normal.   Abdominal: Soft. Bowel sounds are normal. She exhibits no distension and no mass. There is no tenderness. There is CVA tenderness. There is no rebound and no guarding.  Right CVA tenderness  Neurological: She is alert.  Skin: Skin is warm and dry.  Psychiatric: She has a normal mood and affect.    ED Course  Procedures (including critical care time) Labs Review Labs Reviewed  CBC WITH DIFFERENTIAL  BASIC METABOLIC PANEL  URINALYSIS, ROUTINE W REFLEX MICROSCOPIC    Imaging Review Ct Abdomen Pelvis Wo Contrast  01/29/2014   CLINICAL DATA:  Right flank pain for 1 day with history of kidney stones  EXAM: CT ABDOMEN AND PELVIS WITHOUT CONTRAST  TECHNIQUE: Multidetector CT imaging of the abdomen and pelvis was performed following the standard protocol without IV contrast.  COMPARISON:  KUB film of October 07, 2011 and CT scan of the abdomen and pelvis of August 13, 2008.  FINDINGS: The kidneys are normal in density and contour. There is no hydronephrosis. There are no calcified stones. The perinephric fat is normal in density. Along the course of the ureters no stones are evident. The urinary bladder is partially distended and normal. The uterus has increased in size since the previous study and demonstrates heterogeneous density with calcification in the fundus. This likely reflects an enlarging fibroid. There is an IUD in place. There are no abnormal adnexal masses. There is no free pelvic fluid.  The gallbladder is surgically absent. The liver, pancreas, spleen, adrenal glands, abdominal aorta, and periaortic and pericaval regions are normal. The small and large bowel loops exhibit no evidence of obstruction or ileus or inflammation. There is a small ventral hernia containing fat. Superior to this there is a ventral hernia which also contains fat and which has increased in size since the previous study. It is narrow necked and measures 2.8 x 3.5 cm.  The lumbar spine and bony pelvis are unremarkable.  The lung bases are clear  IMPRESSION: 1. There is no hydronephrosis nor evidence of calcified urinary tract stones. The ureters and bladder appear normal. 2. The uterine fundus has enlarged since the previous study and demonstrates heterogeneous density consistent with a fibroid. There are no adnexal masses. 3. There is no acute hepatobiliary nor acute bowel abnormality.   Electronically Signed   By: David  Martinique   On: 01/29/2014 11:07     EKG Interpretation None     11:46 AM Patient reports that her pain and nausea are controled at this time.   MDM   Final diagnoses:  None   Patient with a history of Kidney Stones presents today with right flank pain that she reports feels similar to the pain that she has had with kidney stones in the past.  Labs unremarkable.  UA negative.  CT ab/pelvis unremarkable with no evidence of urinary tract stone or hydronephrosis.  Feel that the patient is stable for discharge.  Return precautions given.    Hyman Bible, PA-C 01/30/14 2312

## 2014-01-31 NOTE — ED Provider Notes (Signed)
Medical screening examination/treatment/procedure(s) were performed by non-physician practitioner and as supervising physician I was immediately available for consultation/collaboration.   EKG Interpretation None        Houston Siren III, MD 01/31/14 [phone]

## 2014-02-02 ENCOUNTER — Encounter (HOSPITAL_COMMUNITY): Payer: Self-pay | Admitting: Emergency Medicine

## 2014-02-02 ENCOUNTER — Emergency Department (HOSPITAL_COMMUNITY)
Admission: EM | Admit: 2014-02-02 | Discharge: 2014-02-02 | Disposition: A | Discharge status: Home / Self Care | Payer: Federal, State, Local not specified - PPO | Source: Home / Self Care

## 2014-02-02 DIAGNOSIS — M546 Pain in thoracic spine: Secondary | ICD-10-CM

## 2014-02-02 DIAGNOSIS — T148XXA Other injury of unspecified body region, initial encounter: Secondary | ICD-10-CM

## 2014-02-02 DIAGNOSIS — X58XXXA Exposure to other specified factors, initial encounter: Secondary | ICD-10-CM

## 2014-02-02 LAB — POCT URINALYSIS DIP (DEVICE)
BILIRUBIN URINE: NEGATIVE
Glucose, UA: NEGATIVE mg/dL
HGB URINE DIPSTICK: NEGATIVE
KETONES UR: NEGATIVE mg/dL
LEUKOCYTES UA: NEGATIVE
Nitrite: NEGATIVE
PH: 5 (ref 5.0–8.0)
Protein, ur: NEGATIVE mg/dL
Specific Gravity, Urine: 1.02 (ref 1.005–1.030)
Urobilinogen, UA: 0.2 mg/dL (ref 0.0–1.0)

## 2014-02-02 MED ORDER — DICLOFENAC SODIUM 1 % TD GEL: 1.0000 "application " | Freq: Four times a day (QID) | TRANSDERMAL | Status: DC

## 2014-02-02 MED ORDER — TRAMADOL HCL 50 MG PO TABS
ORAL_TABLET | ORAL | Status: DC
Start: 2014-02-02 — End: 2016-08-19

## 2014-02-02 NOTE — ED Notes (Signed)
Mid back pain.  Patient is a [employer] manager.  Evaluated at [facility] long for a possible kidney stone, diagnosis r/o.

## 2014-02-02 NOTE — Discharge Instructions (Signed)
Back Pain, Adult °Low back pain is very common. About 1 in 5 people have back pain. The cause of low back pain is rarely dangerous. The pain often gets better over time. About half of people with a sudden onset of back pain feel better in just 2 weeks. About 8 in 10 people feel better by 6 weeks.  °CAUSES °Some common causes of back pain include: °· Strain of the muscles or ligaments supporting the spine. °· Wear and tear (degeneration) of the spinal discs. °· Arthritis. °· Direct injury to the back. °DIAGNOSIS °Most of the time, the direct cause of low back pain is not known. However, back pain can be treated effectively even when the exact cause of the pain is unknown. Answering your caregiver's questions about your overall health and symptoms is one of the most accurate ways to make sure the cause of your pain is not dangerous. If your caregiver needs more information, he or she may order lab work or imaging tests (X-rays or MRIs). However, even if imaging tests show changes in your back, this usually does not require surgery. °HOME CARE INSTRUCTIONS °For many people, back pain returns. Since low back pain is rarely dangerous, it is often a condition that people can learn to manage on their own.  °· Remain active. It is stressful on the back to sit or stand in one place. Do not sit, drive, or stand in one place for more than 30 minutes at a time. Take short walks on level surfaces as soon as pain allows. Try to increase the length of time you walk each day. °· Do not stay in bed. Resting more than 1 or 2 days can delay your recovery. °· Do not avoid exercise or work. Your body is made to move. It is not dangerous to be active, even though your back may hurt. Your back will likely heal faster if you return to being active before your pain is gone. °· Pay attention to your body when you  bend and lift. Many people have less discomfort when lifting if they bend their knees, keep the load close to their bodies, and  avoid twisting. Often, the most comfortable positions are those that put less stress on your recovering back. °· Find a comfortable position to sleep. Use a firm mattress and lie on your side with your knees slightly bent. If you lie on your back, put a pillow under your knees. °· Only take over-the-counter or prescription medicines as directed by your caregiver. Over-the-counter medicines to reduce pain and inflammation are often the most helpful. Your caregiver may prescribe muscle relaxant drugs. These medicines help dull your pain so you can more quickly return to your normal activities and healthy exercise. °· Put ice on the injured area. °¨ Put ice in a plastic bag. °¨ Place a towel between your skin and the bag. °¨ Leave the ice on for 15-20 minutes, 03-04 times a day for the first 2 to 3 days. After that, ice and heat may be alternated to reduce pain and spasms. °· Ask your caregiver about trying back exercises and gentle massage. This may be of some benefit. °· Avoid feeling anxious or stressed. Stress increases muscle tension and can worsen back pain. It is important to recognize when you are anxious or stressed and learn ways to manage it. Exercise is a great option. °SEEK MEDICAL CARE IF: °· You have pain that is not relieved with rest or medicine. °· You have pain that does not improve in 1 week. °· You have new symptoms. °· You are generally not feeling well. °SEEK   IMMEDIATE MEDICAL CARE IF:   You have pain that radiates from your back into your legs.  You develop new bowel or bladder control problems.  You have unusual weakness or numbness in your arms or legs.  You develop nausea or vomiting.  You develop abdominal pain.  You feel faint. Document Released: 07/13/2005 Document Revised: 01/12/2012 Document Reviewed: 12/01/2010 [facility] Patient Information 2015 [place], [place]. This information is not intended to replace advice given to you by your health care provider. Make sure you  discuss any questions you have with your health care provider.  Muscle Strain A muscle strain (pulled muscle) happens when a muscle is stretched beyond normal length. It happens when a sudden, violent force stretches your muscle too far. Usually, a few of the fibers in your muscle are torn. Muscle strain is common in athletes. Recovery usually takes 1-2 weeks. Complete healing takes 5-6 weeks.  HOME CARE   Follow the PRICE method of treatment to help your injury get better. Do this the first 2-3 days after the injury:  Protect. Protect the muscle to keep it from getting injured again.  Rest. Limit your activity and rest the injured body part.  Ice. Put ice in a plastic bag. Place a towel between your skin and the bag. Then, apply the ice and leave it on from 15-20 minutes each hour. After the third day, switch to moist heat packs.  Compression. Use a splint or elastic bandage on the injured area for comfort. Do not put it on too tightly.  Elevate. Keep the injured body part above the level of your heart.  Only take medicine as told by your doctor.  Warm up before doing exercise to prevent future muscle strains. GET HELP IF:   You have more pain or puffiness (swelling) in the injured area.  You feel numbness, tingling, or notice a loss of strength in the injured area. MAKE SURE YOU:   Understand these instructions.  Will watch your condition.  Will get help right away if you are not doing well or get worse. Document Released: 04/21/2008 Document Revised: 05/03/2013 Document Reviewed: 02/09/2013 [facility] Patient Information 2015 [place], [place]. This information is not intended to replace advice given to you by your health care provider. Make sure you discuss any questions you have with your health care provider.  Thoracic Strain You have injured the muscles or tendons that attach to the upper part of your back behind your chest. This injury is called a thoracic strain, thoracic  sprain, or mid-back strain.  CAUSES  The cause of thoracic strain varies. A less severe injury involves pulling a muscle or tendon without tearing it. A more severe injury involves tearing (rupturing) a muscle or tendon. With less severe injuries, there may be little loss of strength. Sometimes, there are breaks (fractures) in the bones to which the muscles are attached. These fractures are rare, unless there was a direct hit (trauma) or you have weak bones due to osteoporosis or age. Longstanding strains may be caused by overuse or improper form during certain movements. Obesity can also increase your risk for back injuries. Sudden strains may occur due to injury or not warming up properly before exercise. Often, there is no obvious cause for a thoracic strain. SYMPTOMS  The main symptom is pain, especially with movement, such as during exercise. DIAGNOSIS  Your caregiver can usually tell what is wrong by taking an X-ray and doing a physical exam. TREATMENT   Physical therapy may be helpful  for recovery. Your caregiver can give you exercises to do or refer you to a physical therapist after your pain improves.  After your pain improves, strengthening and conditioning programs appropriate for your sport or occupation may be helpful.  Always warm up before physical activities or athletics. Stretching after physical activity may also help.  Certain over-the-counter medicines may also help. Ask your caregiver if there are medicines that would help you. If this is your first thoracic strain injury, proper care and proper healing time before starting activities should prevent long-term problems. Torn ligaments and tendons require as long to heal as broken bones. Average healing times may be only 1 week for a mild strain. For torn muscles and tendons, healing time may be up to 6 weeks to 2 months. HOME CARE INSTRUCTIONS   Apply ice to the injured area. Ice massages may also be used as directed.  Put  ice in a plastic bag.  Place a towel between your skin and the bag.  Leave the ice on for 15-20 minutes, 03-04 times a day, for the first 2 days.  Only take over-the-counter or prescription medicines for pain, discomfort, or fever as directed by your caregiver.  Keep your appointments for physical therapy if this was prescribed.  Use wraps and back braces as instructed. SEEK IMMEDIATE MEDICAL CARE IF:   You have an increase in bruising, swelling, or pain.  Your pain has not improved with medicines.  You develop new shortness of breath, chest pain, or fever.  Problems seem to be getting worse rather than better. MAKE SURE YOU:   Understand these instructions.  Will watch your condition.  Will get help right away if you are not doing well or get worse. Document Released: 10/03/2003 Document Revised: 10/05/2011 Document Reviewed: 08/29/2010 [facility] Patient Information 2015 [place], [place]. This information is not intended to replace advice given to you by your health care provider. Make sure you discuss any questions you have with your health care provider.

## 2014-02-02 NOTE — ED Provider Notes (Signed)
CSN: [id number]     Arrival date & time 02/02/14  1108 History   First MD Initiated Contact with Patient 02/02/14 1127     Chief Complaint  Patient presents with  . Back Pain   (Consider location/radiation/quality/duration/timing/severity/associated sxs/prior Treatment) HPI Comments: C/O R parathoracic pain for 6 d. Went to ED for same on the 6th and underwent abd/pelvic CT. No stones or other pathology found.  Pain unchanged. No radiation . Worse with movement, sitting and lying.    Past Medical History  Diagnosis Date  . Kidney stone   . Anxiety   . Depression    Past Surgical History  Procedure Laterality Date  . Tubal ligation  1999  . Cholecystectomy  2002  . Wrist surgery     Family History  Problem Relation Age of Onset  . Hypertension Other   . Cancer Other    History  Substance Use Topics  . Smoking status: Never Smoker   . Smokeless tobacco: Not on file  . Alcohol Use: 0.6 oz/week    1 Glasses of wine per week     Comment: one glass wine twice monthly   OB History   Grav Para Term Preterm Abortions TAB SAB Ect Mult Living                 Review of Systems  Constitutional: Negative for fever, chills and activity change.  HENT: Negative.   Respiratory: Negative.   Cardiovascular: Negative.   Gastrointestinal: Negative for nausea, vomiting and abdominal pain.  Genitourinary: Negative for dysuria, urgency, frequency, hematuria, flank pain and difficulty urinating.  Musculoskeletal:       As per HPI  Skin: Negative for color change, pallor and rash.  Neurological: Negative.     Allergies  Review of patient's allergies indicates no known allergies.  Home Medications   Prior to Admission medications   Medication Sig Start Date End Date Taking? Authorizing Provider  amoxicillin (AMOXIL) 500 MG capsule Take 500 mg by mouth 3 (three) times daily. 01/19/14   Historical Provider, MD  benzonatate (TESSALON) 100 MG capsule Take 1 capsule (100 mg total) by  mouth every 8 (eight) hours. As needed for cough 01/04/14   Lahoma Rocker, PA  cetirizine (ZYRTEC) 10 MG tablet Take 1 tablet (10 mg total) by mouth daily. 01/04/14   Lahoma Rocker, PA  diclofenac sodium (VOLTAREN) 1 % GEL Apply 1 application topically 4 (four) times daily. 02/02/14   Janne Napoleon, NP  FLUoxetine (PROZAC) 20 MG capsule Take 20 mg by mouth daily.    Historical Provider, MD  HYDROcodone-acetaminophen (NORCO/VICODIN) 5-325 MG per tablet Take 2 tablets by mouth every 6 (six) hours as needed. 01/29/14   Heather Laisure, PA-C  ondansetron (ZOFRAN ODT) 4 MG disintegrating tablet Take 1 tablet (4 mg total) by mouth every 8 (eight) hours as needed for nausea or vomiting. 01/29/14   Hyman Bible, PA-C  traMADol (ULTRAM) 50 MG tablet 1-2 tabs po q 6 hr prn pain Maximum dose= 8 tablets per day 02/02/14   Janne Napoleon, NP  traZODone (DESYREL) 50 MG tablet Take 1 tablet (50 mg total) by mouth at bedtime. For insomnia. 07/12/13   Nena Polio, PA-C  traZODone (DESYREL) 50 MG tablet Take 50 mg by mouth at bedtime as needed for sleep.    Historical Provider, MD   BP 133/95  Pulse 90  Temp(Src) 98.6 F (37 C) (Oral)  Resp 16  SpO2 99%  LMP 01/09/2014 Physical Exam  Nursing  note and vitals reviewed. Constitutional: She is oriented to person, place, and time. She appears well-developed and well-nourished. No distress.  HENT:  Head: Normocephalic and atraumatic.  Eyes: EOM are normal. Pupils are equal, round, and reactive to light.  Neck: Normal range of motion. Neck supple.  Cardiovascular: Normal rate, regular rhythm and normal heart sounds.   Pulmonary/Chest: Effort normal and breath sounds normal. No respiratory distress. She has no wheezes.  Abdominal: Soft. There is no tenderness.  Musculoskeletal: She exhibits tenderness. She exhibits no edema.  Reproducible tenderness to R para thoracic musculature. No flank  or CVAT.   Neurological: She is alert and oriented to person, place,  and time. No cranial nerve deficit.  Skin: Skin is warm and dry.    ED Course  Procedures (including critical care time) Labs Review Labs Reviewed  POCT URINALYSIS DIP (DEVICE)    Imaging Review No results found. Results for orders placed during the hospital encounter of 02/02/14  POCT URINALYSIS DIP (DEVICE)      Result Value Ref Range   Glucose, UA NEGATIVE  NEGATIVE mg/dL   Bilirubin Urine NEGATIVE  NEGATIVE   Ketones, ur NEGATIVE  NEGATIVE mg/dL   Specific Gravity, Urine 1.020  1.005 - 1.030   Hgb urine dipstick NEGATIVE  NEGATIVE   pH 5.0  5.0 - 8.0   Protein, ur NEGATIVE  NEGATIVE mg/dL   Urobilinogen, UA 0.2  0.0 - 1.0 mg/dL   Nitrite NEGATIVE  NEGATIVE   Leukocytes, UA NEGATIVE  NEGATIVE     MDM   1. Muscle strain   2. Right-sided thoracic back pain     Diclofenac gel tramadol Heat Back pain instructions F/U with PCP, may need PT    Janne Napoleon, NP 02/02/14 1210

## 2014-02-04 NOTE — ED Provider Notes (Signed)
Medical screening examination/treatment/procedure(s) were performed by resident physician or non-physician practitioner and as supervising physician I was immediately available for consultation/collaboration.   Pauline Good MD.   Billy Fischer, MD 02/04/14 1314

## 2014-11-08 ENCOUNTER — Encounter (HOSPITAL_COMMUNITY): Payer: Self-pay | Admitting: *Deleted

## 2014-11-08 ENCOUNTER — Emergency Department (INDEPENDENT_AMBULATORY_CARE_PROVIDER_SITE_OTHER)
Admission: EM | Admit: 2014-11-08 | Discharge: 2014-11-08 | Disposition: A | Discharge status: Home / Self Care | Payer: Federal, State, Local not specified - PPO | Source: Home / Self Care | Attending: Family Medicine | Admitting: Family Medicine

## 2014-11-08 DIAGNOSIS — A084 Viral intestinal infection, unspecified: Secondary | ICD-10-CM | POA: Diagnosis not present

## 2014-11-08 DIAGNOSIS — R1011 Right upper quadrant pain: Secondary | ICD-10-CM

## 2014-11-08 MED ORDER — ONDANSETRON HCL 4 MG PO TABS: 4.0000 mg | ORAL_TABLET | Freq: Four times a day (QID) | ORAL | Status: DC

## 2014-11-08 NOTE — ED Provider Notes (Signed)
44-year-old white or black 20 CSN: [id number]     Arrival date & time 11/08/14  1118 History   First MD Initiated Contact with Patient 11/08/14 1243     Chief Complaint  Patient presents with  . Nausea   (Consider location/radiation/quality/duration/timing/severity/associated sxs/prior Treatment) HPI Comments: 44 year old female is complaining of epigastric and right upper quadrant pain associated with nausea, vomiting and diarrhea for 3-4 days. She states that her vomiting has stopped today she does have diarrhea and nausea. She denies fever, chills, headache, sore throat or upper respiratory symptoms.   Past Medical History  Diagnosis Date  . Kidney stone   . Anxiety   . Depression    Past Surgical History  Procedure Laterality Date  . Tubal ligation  1999  . Cholecystectomy  2002  . Wrist surgery     Family History  Problem Relation Age of Onset  . Hypertension Other   . Cancer Other    History  Substance Use Topics  . Smoking status: Never Smoker   . Smokeless tobacco: Not on file  . Alcohol Use: 0.6 oz/week    1 Glasses of wine per week     Comment: one glass wine twice monthly   OB History    No data available     Review of Systems  Constitutional: Positive for activity change and appetite change. Negative for fever.  HENT: Negative.   Respiratory: Negative.   Cardiovascular: Negative.   Gastrointestinal: Positive for nausea, vomiting, abdominal pain and diarrhea. Negative for constipation, blood in stool and abdominal distention.  Genitourinary: Negative.   Musculoskeletal: Negative.   Neurological: Negative.     Allergies  Review of patient's allergies indicates no known allergies.  Home Medications   Prior to Admission medications   Medication Sig Start Date End Date Taking? Authorizing Provider  escitalopram (LEXAPRO) 10 MG tablet Take 10 mg by mouth daily.   Yes Historical Provider, MD  diclofenac sodium (VOLTAREN) 1 % GEL Apply 1 application  topically 4 (four) times daily. 02/02/14   Janne Napoleon, NP  FLUoxetine (PROZAC) 20 MG capsule Take 20 mg by mouth daily.    Historical Provider, MD  ondansetron (ZOFRAN ODT) 4 MG disintegrating tablet Take 1 tablet (4 mg total) by mouth every 8 (eight) hours as needed for nausea or vomiting. 01/29/14   Heather Laisure, PA-C  ondansetron (ZOFRAN) 4 MG tablet Take 1 tablet (4 mg total) by mouth every 6 (six) hours. 11/08/14   Janne Napoleon, NP  traMADol (ULTRAM) 50 MG tablet 1-2 tabs po q 6 hr prn pain Maximum dose= 8 tablets per day 02/02/14   Janne Napoleon, NP   BP 105/82 mmHg  Pulse 84  Temp(Src) 99.2 F (37.3 C) (Oral)  Resp 18  SpO2 99%  LMP 10/26/2014 Physical Exam  Constitutional: She is oriented to person, place, and time. She appears well-developed and well-nourished. No distress.  HENT:  Mouth/Throat: Oropharynx is clear and moist. No oropharyngeal exudate.  Eyes: Conjunctivae and EOM are normal.  Neck: Normal range of motion. Neck supple.  Cardiovascular: Normal rate, regular rhythm, normal heart sounds and intact distal pulses.   Pulmonary/Chest: Effort normal and breath sounds normal. No respiratory distress. She has no wheezes. She has no rales.  Abdominal: Soft. Bowel sounds are normal. She exhibits no distension and no mass. There is no rebound and no guarding.  Minor tenderness to the right upper quadrant and lesser to the epigastrium. Equivocal Murphy's sign.  Lymphadenopathy:    She has  no cervical adenopathy.  Neurological: She is alert and oriented to person, place, and time.  Skin: Skin is warm and dry.  Psychiatric: She has a normal mood and affect.  Nursing note and vitals reviewed.   ED Course  Procedures (including critical care time) Labs Review Labs Reviewed - No data to display  Imaging Review No results found.   MDM   1. Viral gastroenteritis   2. RUQ pain    Zofran for nausea. Clear liquids and no solid foods for the remainder of the day slowly advanced  diet as tolerated. Recommend Pedialyte. May take one Imodium when you get home since she had 6 diarrhea stools this morning. Do not take more than 2 or 3 Imodium tablets during a day. Do not stop your diarrhea. For pain in the right upper quadrant to persist even if getting better from the other symptoms follow-up with your primary care doctor.    Janne Napoleon, NP 11/08/14 1301

## 2014-11-08 NOTE — Discharge Instructions (Signed)
Abdominal Pain Many things can cause abdominal pain. Usually, abdominal pain is not caused by a disease and will improve without treatment. It can often be observed and treated at home. Your health care provider will do a physical exam and possibly order blood tests and X-rays to help determine the seriousness of your pain. However, in many cases, more time must pass before a clear cause of the pain can be found. Before that point, your health care provider may not know if you need more testing or further treatment. HOME CARE INSTRUCTIONS  Monitor your abdominal pain for any changes. The following actions may help to alleviate any discomfort you are experiencing:  Only take over-the-counter or prescription medicines as directed by your health care provider.  Do not take laxatives unless directed to do so by your health care provider.  Try a clear liquid diet (broth, tea, or water) as directed by your health care provider. Slowly move to a bland diet as tolerated. SEEK MEDICAL CARE IF:  You have unexplained abdominal pain.  You have abdominal pain associated with nausea or diarrhea.  You have pain when you urinate or have a bowel movement.  You experience abdominal pain that wakes you in the night.  You have abdominal pain that is worsened or improved by eating food.  You have abdominal pain that is worsened with eating fatty foods.  You have a fever. SEEK IMMEDIATE MEDICAL CARE IF:   Your pain does not go away within 2 hours.  You keep throwing up (vomiting).  Your pain is felt only in portions of the abdomen, such as the right side or the left lower portion of the abdomen.  You pass bloody or black tarry stools. MAKE SURE YOU:  Understand these instructions.   Will watch your condition.   Will get help right away if you are not doing well or get worse.  Document Released: 04/22/2005 Document Revised: 07/18/2013 Document Reviewed: 03/22/2013 [facility] Patient Information  2015 [place], [place]. This information is not intended to replace advice given to you by your health care provider. Make sure you discuss any questions you have with your health care provider.  Viral Gastroenteritis Viral gastroenteritis is also known as stomach flu. This condition affects the stomach and intestinal tract. It can cause sudden diarrhea and vomiting. The illness typically lasts 3 to 8 days. Most people develop an immune response that eventually gets rid of the virus. While this natural response develops, the virus can make you quite ill. CAUSES  Many different viruses can cause gastroenteritis, such as rotavirus or noroviruses. You can catch one of these viruses by consuming contaminated food or water. You may also catch a virus by sharing utensils or other personal items with an infected person or by touching a contaminated surface. SYMPTOMS  The most common symptoms are diarrhea and vomiting. These problems can cause a severe loss of body fluids (dehydration) and a body salt (electrolyte) imbalance. Other symptoms may include:  Fever.  Headache.  Fatigue.  Abdominal pain. DIAGNOSIS  Your caregiver can usually diagnose viral gastroenteritis based on your symptoms and a physical exam. A stool sample may also be taken to test for the presence of viruses or other infections. TREATMENT  This illness typically goes away on its own. Treatments are aimed at rehydration. The most serious cases of viral gastroenteritis involve vomiting so severely that you are not able to keep fluids down. In these cases, fluids must be given through an intravenous line (IV). HOME  CARE INSTRUCTIONS   Drink enough fluids to keep your urine clear or pale yellow. Drink small amounts of fluids frequently and increase the amounts as tolerated.  Ask your caregiver for specific rehydration instructions.  Avoid:  Foods high in sugar.  Alcohol.  Carbonated drinks.  Tobacco.  Juice.  Caffeine  drinks.  Extremely hot or cold fluids.  Fatty, greasy foods.  Too much intake of anything at one time.  Dairy products until 24 to 48 hours after diarrhea stops.  You may consume probiotics. Probiotics are active cultures of beneficial bacteria. They may lessen the amount and number of diarrheal stools in adults. Probiotics can be found in yogurt with active cultures and in supplements.  Wash your hands well to avoid spreading the virus.  Only take over-the-counter or prescription medicines for pain, discomfort, or fever as directed by your caregiver. Do not give aspirin to children. Antidiarrheal medicines are not recommended.  Ask your caregiver if you should continue to take your regular prescribed and over-the-counter medicines.  Keep all follow-up appointments as directed by your caregiver. SEEK IMMEDIATE MEDICAL CARE IF:   You are unable to keep fluids down.  You do not urinate at least once every 6 to 8 hours.  You develop shortness of breath.  You notice blood in your stool or vomit. This may look like coffee grounds.  You have abdominal pain that increases or is concentrated in one small area (localized).  You have persistent vomiting or diarrhea.  You have a fever.  The patient is a child younger than 3 months, and he or she has a fever.  The patient is a child older than 3 months, and he or she has a fever and persistent symptoms.  The patient is a child older than 3 months, and he or she has a fever and symptoms suddenly get worse.  The patient is a baby, and he or she has no tears when crying. MAKE SURE YOU:   Understand these instructions.  Will watch your condition.  Will get help right away if you are not doing well or get worse. Document Released: 07/13/2005 Document Revised: 10/05/2011 Document Reviewed: 04/29/2011 [facility] Patient Information 2015 [place], [place]. This information is not intended to replace advice given to you by your health care  provider. Make sure you discuss any questions you have with your health care provider.

## 2014-11-08 NOTE — ED Notes (Signed)
Pt  Reports  Nausea   Vomiting   /  Diarrhea   X  4  Days       Nauseated  Today  But  Has  Not  Vomited  Today      Tolerating  Liquids     -   Started  Taking  lexapro  3  Weeks    ago

## 2016-08-19 ENCOUNTER — Encounter (HOSPITAL_COMMUNITY): Payer: Self-pay | Admitting: Emergency Medicine

## 2016-08-19 ENCOUNTER — Ambulatory Visit (HOSPITAL_COMMUNITY)
Admission: EM | Admit: 2016-08-19 | Discharge: 2016-08-19 | Disposition: A | Discharge status: Home / Self Care | Payer: Federal, State, Local not specified - PPO | Attending: Emergency Medicine | Admitting: Emergency Medicine

## 2016-08-19 DIAGNOSIS — R0789 Other chest pain: Secondary | ICD-10-CM

## 2016-08-19 DIAGNOSIS — R059 Cough, unspecified: Secondary | ICD-10-CM

## 2016-08-19 DIAGNOSIS — F419 Anxiety disorder, unspecified: Secondary | ICD-10-CM | POA: Diagnosis not present

## 2016-08-19 DIAGNOSIS — R05 Cough: Secondary | ICD-10-CM | POA: Diagnosis not present

## 2016-08-19 MED ORDER — BENZONATATE 100 MG PO CAPS: 200.0000 mg | ORAL_CAPSULE | Freq: Three times a day (TID) | ORAL | 0 refills | Status: DC | PRN

## 2016-08-19 MED ORDER — NAPROXEN 500 MG PO TABS: 500.0000 mg | ORAL_TABLET | Freq: Two times a day (BID) | ORAL | 0 refills | Status: DC

## 2016-08-19 NOTE — ED Triage Notes (Addendum)
The patient presented to the [facility] with a complaint of a cough for 3 months and chest soreness that started last night. The patient stated that it is the same feeling as when she has anxiety. The patient stated that she was recently prescribed Abilify for the anxiety but she has not taken it.

## 2016-08-19 NOTE — ED Notes (Signed)
The patient did report that the chest pain increases with inspiration and palpation.

## 2016-08-19 NOTE — ED Provider Notes (Signed)
CSN: [id number]     Arrival date & time 08/19/16  1536 History   First MD Initiated Contact with Patient 08/19/16 1555     Chief Complaint  Patient presents with  . Cough   (Consider location/radiation/quality/duration/timing/severity/associated sxs/prior Treatment) The history is provided by the patient.  Cough  Cough characteristics:  Non-productive Severity:  Moderate Onset quality:  Gradual Duration:  12 weeks Timing:  Constant Progression:  Unchanged Chronicity:  New Smoker: no   Relieved by:  Nothing   Past Medical History:  Diagnosis Date  . Anxiety   . Depression   . Kidney stone    Past Surgical History:  Procedure Laterality Date  . CHOLECYSTECTOMY  2002  . TUBAL LIGATION  1999  . WRIST SURGERY     Family History  Problem Relation Age of Onset  . Hypertension Other   . Cancer Other    Social History  Substance Use Topics  . Smoking status: Never Smoker  . Smokeless tobacco: Not on file  . Alcohol use 0.6 oz/week    1 Glasses of wine per week     Comment: one glass wine twice monthly   OB History    No data available     Review of Systems  Constitutional: Negative.   HENT: Negative.   Eyes: Negative.   Respiratory: Positive for cough.   Cardiovascular: Negative.   Endocrine: Negative.   Genitourinary: Negative.   Musculoskeletal: Negative.   Skin: Negative.   Allergic/Immunologic: Negative.   Neurological: Negative.   Hematological: Negative.   Psychiatric/Behavioral: Positive for agitation.    Allergies  Patient has no known allergies.  Home Medications   Prior to Admission medications   Medication Sig Start Date End Date Taking? Authorizing Provider  benzonatate (TESSALON) 100 MG capsule Take 2 capsules (200 mg total) by mouth 3 (three) times daily as needed for cough. 08/19/16   Lysbeth Penner, FNP  naproxen (NAPROSYN) 500 MG tablet Take 1 tablet (500 mg total) by mouth 2 (two) times daily with a meal. 08/19/16   Lysbeth Penner,  FNP   Meds Ordered and Administered this Visit  Medications - No data to display  BP 135/86 (BP Location: Right Arm)   Pulse 92   Temp 98.4 F (36.9 C) (Oral)   Resp 20   SpO2 98%  No data found.   Physical Exam  Constitutional: She is oriented to person, place, and time. She appears well-developed and well-nourished.  HENT:  Head: Normocephalic and atraumatic.  Right Ear: External ear normal.  Left Ear: External ear normal.  Mouth/Throat: Oropharynx is clear and moist.  Eyes: Conjunctivae and EOM are normal. Pupils are equal, round, and reactive to light.  Neck: Normal range of motion. Neck supple.  Cardiovascular: Normal rate, regular rhythm and normal heart sounds.   Pulmonary/Chest: Effort normal and breath sounds normal.  Abdominal: Soft. Bowel sounds are normal.  Neurological: She is alert and oriented to person, place, and time.  Nursing note and vitals reviewed.   Urgent Care Course     Procedures (including critical care time)  Labs Review Labs Reviewed - No data to display  Imaging Review No results found.   Visual Acuity Review  Right Eye Distance:   Left Eye Distance:   Bilateral Distance:    Right Eye Near:   Left Eye Near:    Bilateral Near:      EKG NSR without acute ST-Tchanges.   MDM   1. Anxiety   2.  Chest wall pain   3. Cough    Naprosyn 500mg  one po bid x 7 days #14 Tessalon Perles 200mg  po tid prn  Reassurance Given If sx's worsen please go to the ED.     Lysbeth Penner, FNP 08/19/16 1622

## 2016-08-20 ENCOUNTER — Inpatient Hospital Stay (HOSPITAL_COMMUNITY)
Admission: AD | Admit: 2016-08-20 | Discharge: 2016-08-20 | Disposition: A | Discharge status: Home / Self Care | Payer: Federal, State, Local not specified - PPO | Source: Ambulatory Visit | Attending: Emergency Medicine | Admitting: Emergency Medicine

## 2016-08-20 ENCOUNTER — Encounter (HOSPITAL_COMMUNITY): Payer: Self-pay | Admitting: *Deleted

## 2016-08-20 DIAGNOSIS — K432 Incisional hernia without obstruction or gangrene: Secondary | ICD-10-CM | POA: Insufficient documentation

## 2016-08-20 DIAGNOSIS — R109 Unspecified abdominal pain: Secondary | ICD-10-CM | POA: Diagnosis present

## 2016-08-20 LAB — CBC WITH DIFFERENTIAL/PLATELET
BASOS ABS: 0 10*3/uL (ref 0.0–0.1)
Basophils Relative: 0 %
EOS PCT: 0 %
Eosinophils Absolute: 0 10*3/uL (ref 0.0–0.7)
HCT: 39.2 % (ref 36.0–46.0)
Hemoglobin: 13 g/dL (ref 12.0–15.0)
Lymphocytes Relative: 24 %
Lymphs Abs: 1.9 10*3/uL (ref 0.7–4.0)
MCH: 28.7 pg (ref 26.0–34.0)
MCHC: 33.2 g/dL (ref 30.0–36.0)
MCV: 86.5 fL (ref 78.0–100.0)
Monocytes Absolute: 0.2 10*3/uL (ref 0.1–1.0)
Monocytes Relative: 2 %
Neutro Abs: 5.6 10*3/uL (ref 1.7–7.7)
Neutrophils Relative %: 74 %
PLATELETS: 377 10*3/uL (ref 150–400)
RBC: 4.53 MIL/uL (ref 3.87–5.11)
RDW: 13.5 % (ref 11.5–15.5)
WBC: 7.7 10*3/uL (ref 4.0–10.5)

## 2016-08-20 LAB — COMPREHENSIVE METABOLIC PANEL
ALT: 17 U/L (ref 14–54)
AST: 19 U/L (ref 15–41)
Albumin: 4.3 g/dL (ref 3.5–5.0)
Alkaline Phosphatase: 43 U/L (ref 38–126)
Anion gap: 9 (ref 5–15)
BUN: 12 mg/dL (ref 6–20)
CHLORIDE: 108 mmol/L (ref 101–111)
CO2: 20 mmol/L — ABNORMAL LOW (ref 22–32)
CREATININE: 0.65 mg/dL (ref 0.44–1.00)
Calcium: 9.1 mg/dL (ref 8.9–10.3)
GFR calc Af Amer: >60 mL/min (ref >60)
GFR calc non Af Amer: >60 mL/min (ref >60)
Glucose, Bld: 148 mg/dL — ABNORMAL HIGH (ref 65–99)
Potassium: 4 mmol/L (ref 3.5–5.1)
Sodium: 137 mmol/L (ref 135–145)
Total Bilirubin: 1 mg/dL (ref 0.3–1.2)
Total Protein: 7.4 g/dL (ref 6.5–8.1)

## 2016-08-20 MED ORDER — HYDROMORPHONE HCL 1 MG/ML IJ SOLN
1.0000 mg | Freq: Once | INTRAMUSCULAR | Status: AC
Start: 2016-08-20 — End: 2016-08-20
  Administered 2016-08-20: 1 mg via INTRAMUSCULAR
  Filled 2016-08-20: qty 1

## 2016-08-20 MED ORDER — ONDANSETRON HCL 4 MG/2ML IJ SOLN
4.0000 mg | Freq: Once | INTRAMUSCULAR | Status: AC
  Administered 2016-08-20: 4 mg via INTRAVENOUS
  Filled 2016-08-20: qty 2

## 2016-08-20 MED ORDER — HYDROMORPHONE HCL 1 MG/ML IJ SOLN
1.0000 mg | Freq: Once | INTRAMUSCULAR | Status: AC
  Administered 2016-08-20: 1 mg via INTRAMUSCULAR
  Filled 2016-08-20: qty 1

## 2016-08-20 MED ORDER — DOCUSATE SODIUM 100 MG PO CAPS: 100.0000 mg | ORAL_CAPSULE | Freq: Two times a day (BID) | ORAL | 0 refills | Status: DC

## 2016-08-20 MED ORDER — ONDANSETRON 8 MG PO TBDP
8.0000 mg | ORAL_TABLET | Freq: Once | ORAL | Status: AC
  Administered 2016-08-20: 8 mg via ORAL
  Filled 2016-08-20: qty 1

## 2016-08-20 MED ORDER — ONDANSETRON 4 MG PO TBDP
4.0000 mg | ORAL_TABLET | Freq: Once | ORAL | Status: AC
  Administered 2016-08-20: 4 mg via ORAL
  Filled 2016-08-20: qty 1

## 2016-08-20 MED ORDER — HYDROCODONE-ACETAMINOPHEN 5-325 MG PO TABS: 1.0000 | ORAL_TABLET | ORAL | 0 refills | Status: DC | PRN

## 2016-08-20 MED ORDER — HYDROMORPHONE HCL 1 MG/ML IJ SOLN
1.0000 mg | Freq: Once | INTRAMUSCULAR | Status: AC
  Administered 2016-08-20: 1 mg via INTRAVENOUS
  Filled 2016-08-20: qty 1

## 2016-08-20 MED ORDER — SODIUM CHLORIDE 0.9 % IV SOLN
Freq: Once | INTRAVENOUS | Status: AC
  Administered 2016-08-20: 20:00:00 via INTRAVENOUS

## 2016-08-20 MED ORDER — SODIUM CHLORIDE 0.9 % IV SOLN: INTRAVENOUS | Status: DC

## 2016-08-20 NOTE — ED Notes (Signed)
Per [facility] pt transferred from [facility] with diagnosis of hernia. Pt c/o right upper midline abdominal pain.

## 2016-08-20 NOTE — MAU Note (Cosign Needed)
Obstetric Resident MAU Note  Chief Complaint:  Abdominal Pain   First Provider Initiated Contact with Patient 08/20/16 1506     HPI: Joanna Osborn is a 46 y.o. [phone] with past surgical hx of cholecystectomy who presents to maternity admissions reporting epigastric and RUQ pain of acute onset.  Pain began this am at 11:30 along with nausea and 4 episodes of emesis.  Pt describes pain as a constant, severe and stabbing pain in the RUQ and epigastric region with no radiation to other areas.  She has tried Gas-X to no benefit.  She had one BM shortly after pain began with stool of normal consistency.  She has not had flatus since onset of pain.  She denies fever, chills, constipation, diarrhea, vaginal bleeding, vaginal discharge, dysuria, urinary frequency, hematuria, hematemesis or blood in stool.  Of note, she states that she noticed an abdominal mass near her umbilicus at the 11 o'clock position about 1 month ago.  This mass did not cause her any symptoms so she did not seek medical care.   Patient Active Problem List   Diagnosis Date Noted  . Suicide ideation 07/07/2013  . Major depressive disorder, recurrent severe without psychotic features ([place]) 07/06/2013    Past Medical History:  Diagnosis Date  . Anxiety   . Depression   . Kidney stone     OB History  Gravida Para Term Preterm AB Living  2 2 2     2   SAB TAB Ectopic Multiple Live Births               # Outcome Date GA Lbr Len/2nd Weight Sex Delivery Anes PTL Lv  2 Term           1 Term               Past Surgical History:  Procedure Laterality Date  . CHOLECYSTECTOMY  2002  . TUBAL LIGATION  1999  . WRIST SURGERY      Family History: Family History  Problem Relation Age of Onset  . Hypertension Other   . Cancer Other     Social History: Social History  Substance Use Topics  . Smoking status: Never Smoker  . Smokeless tobacco: Never Used  . Alcohol use 0.6 oz/week    1 Glasses of wine per week   Comment: one glass wine twice monthly   -Sexually active in a monogamous relationship, uses no protection, is s/p BTL and has IUD for uterine fibroids.  Allergies: No Known Allergies  Prescriptions Prior to Admission  Medication Sig Dispense Refill Last Dose  . benzonatate (TESSALON) 100 MG capsule Take 2 capsules (200 mg total) by mouth 3 (three) times daily as needed for cough. 21 capsule 0   . naproxen (NAPROSYN) 500 MG tablet Take 1 tablet (500 mg total) by mouth 2 (two) times daily with a meal. 14 tablet 0     ROS: Pertinent findings in history of present illness.  Physical Exam  Blood pressure 147/78, pulse 87, temperature 97.7 F (36.5 C), temperature source Oral, resp. rate 25, last menstrual period 07/23/2016, SpO2 100 %. CONSTITUTIONAL: Well-developed, well-nourished, noticeably uncomfortable female.  HEAD:  Normocephalic, atraumatic, External right and left ear normal.  EYES: Conjunctivae and EOM are normal. No scleral icterus.  NECK: Normal range of motion CARDIOVASCULAR: Normal heart rate noted, regular rhythm RESPIRATORY: Effort and breath sounds normal, no problems with respiration noted ABDOMEN: Soft, nondistended, normoactive bowel sounds, tender to palpation in RUQ and epigastric region,  tender 3 cm x 3 cm mass noted near umbilicus at 11 o'clock position with no erythema or overlying skin changes that is not reducible on exam, no guarding or rebound MUSCULOSKELETAL: Normal range of motion. No edema and no tenderness. SKIN: Skin is warm and diaphoretic. No rash noted. No erythema. No pallor. Shubert: Alert and oriented to person, place, and time.  No cranial nerve deficit noted. PSYCHIATRIC: Normal mood and affect. Normal behavior. Normal judgment and thought content.  SPECULUM EXAM: deferred    Labs: No results found for this or any previous visit (from the past 24 hour(s)).  Imaging:  No results found.  MAU Course: CBC wnl CMP pending  Assessment and  Plan: Joanna Osborn is a 46 yo G2P2002 presenting with acute onset RUQ and epigastric pain and associated n/v who requires further evaluation and treatment including pain control.  RUQ and epigastric Abdominal Pain: Pain most consistent with hernia vs incarcerated hernia given acute onset of pain around 3 cm x 3 cm non-reducible abdominal mass near the umbilicus at 11 o'clock position, n/v, and lack of flatus since onset of pain.  Differential also includes acute pancreatitis given epigastric pain and n/v, but less likely given pt has 2 alcoholic drinks a month, does not use tobacco, is s/p cholecystectomy and has no other risk factors. -Dilaudid injection x1 for pain -IV maintenance fluids  Nausea and vomiting: Likely associated with acute onset abdominal pain. -Zofran for n/v  Dispo: Transfer to [facility] for further evaluation and treatment.   Joanna Osborn, Medical Student PGY-2 08/20/2016 3:27 PM

## 2016-08-20 NOTE — ED Notes (Signed)
Pt is actively vomiting during discharge. Contacting MD for nausea medication.

## 2016-08-20 NOTE — MAU Note (Signed)
Pt stated she started having some abd cramping this morning around 11:30. Thought it may be gas . Had a BM. Still had some pain. Came to  U/s with her grandson for an appointment and then started having severe pain in her mid to lower right quadrant and felt nauseated. Has vomited several times since.

## 2016-08-20 NOTE — ED Notes (Signed)
1 unsuccessful IV attempt. Another RN to attempt.

## 2016-08-20 NOTE — ED Provider Notes (Signed)
[facility] DEPT Provider Note   CSN: [id number] Arrival date & time: 08/20/16  1355     History   Chief Complaint Chief Complaint  Patient presents with  . Abdominal Pain    HPI Joanna Osborn is a 46 y.o. female.  HPI Patient is referred from MAU for abdominal pain. Patient states that at work she started to get some gradual discomfort in her central abdomen. It continued to worsen. She went home and had a normal bowel movement without any relief. She had to take her grandson to [facility] hospital for an ultrasound. While there for pain just continued to get much worse and they instructed her to go to the emergency department. She was evaluated and found to have a lump in her central abdomen. The patient reports that she noted a small knot in her central abdomen but it had not caused her to from pursue any actual treatment for it. She reports today's the first day that it got enlarged and firm and now very painful. Past Medical History:  Diagnosis Date  . Anxiety   . Depression   . Kidney stone     Patient Active Problem List   Diagnosis Date Noted  . Suicide ideation 07/07/2013  . Major depressive disorder, recurrent severe without psychotic features ([place]) 07/06/2013    Past Surgical History:  Procedure Laterality Date  . CHOLECYSTECTOMY  2002  . TUBAL LIGATION  1999  . WRIST SURGERY      OB History    Gravida Para Term Preterm AB Living   2 2 2     2    SAB TAB Ectopic Multiple Live Births                   Home Medications    Prior to Admission medications   Medication Sig Start Date End Date Taking? Authorizing Provider  levonorgestrel (MIRENA) 20 MCG/24HR IUD 1 each by Intrauterine route once.   Yes Historical Provider, MD  Oxcarbazepine (TRILEPTAL) 300 MG tablet Take 300 mg by mouth at bedtime.  05/25/16  Yes Historical Provider, MD  benzonatate (TESSALON) 100 MG capsule Take 2 capsules (200 mg total) by mouth 3 (three) times daily as needed for cough.  08/19/16   Lysbeth Penner, FNP  docusate sodium (COLACE) 100 MG capsule Take 1 capsule (100 mg total) by mouth every 12 (twelve) hours. 08/20/16   Charlesetta Shanks, MD  HYDROcodone-acetaminophen (NORCO/VICODIN) 5-325 MG tablet Take 1-2 tablets by mouth every 4 (four) hours as needed for moderate pain or severe pain. 08/20/16   Charlesetta Shanks, MD  naproxen (NAPROSYN) 500 MG tablet Take 1 tablet (500 mg total) by mouth 2 (two) times daily with a meal. 08/19/16   Lysbeth Penner, FNP    Family History Family History  Problem Relation Age of Onset  . Hypertension Other   . Cancer Other     Social History Social History  Substance Use Topics  . Smoking status: Never Smoker  . Smokeless tobacco: Never Used  . Alcohol use 0.6 oz/week    1 Glasses of wine per week     Comment: one glass wine twice monthly     Allergies   Patient has no known allergies.   Review of Systems Review of Systems 10 Systems reviewed and are negative for acute change except as noted in the HPI.   Physical Exam Updated Vital Signs BP 134/85 (BP Location: Right Arm)   Pulse 86   Temp 97.7 F (36.5  C) (Oral)   Resp 15   LMP 07/23/2016 (Approximate)   SpO2 100%   Physical Exam  Constitutional: She is oriented to person, place, and time. She appears well-developed and well-nourished.  Patient appears to be in pain. She is alert and nontoxic.  HENT:  Head: Normocephalic and atraumatic.  Mouth/Throat: Oropharynx is clear and moist.  Eyes: Conjunctivae are normal.  Neck: Neck supple.  Cardiovascular: Normal rate and regular rhythm.   No murmur heard. Pulmonary/Chest: Effort normal and breath sounds normal. No respiratory distress.  Abdominal: Soft. She exhibits mass. There is tenderness.  Patient has a firm approximately 5 cm local mass just superior to her umbilicus. Remainder of abdomen is nontender.  Musculoskeletal: Normal range of motion. She exhibits no edema.  Neurological: She is alert and  oriented to person, place, and time. She exhibits normal muscle tone. Coordination normal.  Skin: Skin is warm and dry.  Psychiatric: She has a normal mood and affect.  Nursing note and vitals reviewed.    ED Treatments / Results  Labs (all labs ordered are listed, but only abnormal results are displayed) Labs Reviewed  COMPREHENSIVE METABOLIC PANEL - Abnormal; Notable for the following:       Result Value   CO2 20 (*)    Glucose, Bld 148 (*)    All other components within normal limits  CBC WITH DIFFERENTIAL/PLATELET    EKG  EKG Interpretation None       Radiology No results found.  Procedures Procedures (including critical care time) After patient had IV established and was given Dilaudid for pain control, hernia was reduced with continuous direct pressure and manipulation. Patient had significant pain improvement once hernia was reduced. Medications Ordered in ED Medications  HYDROmorphone (DILAUDID) injection 1 mg (1 mg Intramuscular Given 08/20/16 1525)  ondansetron (ZOFRAN-ODT) disintegrating tablet 8 mg (8 mg Oral Given 08/20/16 1553)  HYDROmorphone (DILAUDID) injection 1 mg (1 mg Intramuscular Given 08/20/16 1731)  HYDROmorphone (DILAUDID) injection 1 mg (1 mg Intravenous Given 08/20/16 2019)  ondansetron (ZOFRAN) injection 4 mg (4 mg Intravenous Given 08/20/16 2018)  0.9 %  sodium chloride infusion ( Intravenous New Bag/Given 08/20/16 2018)  HYDROmorphone (DILAUDID) injection 1 mg (1 mg Intravenous Given 08/20/16 2034)     Initial Impression / Assessment and Plan / ED Course  I have reviewed the triage vital signs and the nursing notes.  Pertinent labs & imaging results that were available during my care of the patient were reviewed by me and considered in my medical decision making (see chart for details).      Final Clinical Impressions(s) / ED Diagnoses   Final diagnoses:  Incisional hernia, without obstruction or gangrene   Patient presents with  abdominal wall hernia. This was successfully reduced after pain medications administered. Patient made aware of the need to be very careful with sitting up, bending and lifting. She is counseled on applying pressure to her abdomen and rolling to the side rather than sitting up directly. She is counseled against any lifting or Valsalva-type maneuvers. Patient instructed on follow-up with. New Prescriptions New Prescriptions   DOCUSATE SODIUM (COLACE) 100 MG CAPSULE    Take 1 capsule (100 mg total) by mouth every 12 (twelve) hours.   HYDROCODONE-ACETAMINOPHEN (NORCO/VICODIN) 5-325 MG TABLET    Take 1-2 tablets by mouth every 4 (four) hours as needed for moderate pain or severe pain.     Charlesetta Shanks, MD 08/20/16 2138

## 2016-08-20 NOTE — ED Notes (Signed)
Bed: WA19 Expected date:  Expected time:  Means of arrival:  Comments: 

## 2016-08-20 NOTE — MAU Provider Note (Signed)
History     CSN: [id number]  Arrival date and time: 08/20/16 1355   First Provider Initiated Contact with Patient 08/20/16 1506      Chief Complaint  Patient presents with  . Abdominal Pain   HPI Ms. Joanna Osborn is a 46 y.o. [id number] who presents to MAU today with complaint of sudden onset RUQ abdominal pain and N/V. She denies diarrhea, fever, UTI symptoms, vaginal bleeding or discharge. She has previously had her gallbladder removed. She has not taken anything for pain.   OB History    Gravida Para Term Preterm AB Living   2 2 2     2    SAB TAB Ectopic Multiple Live Births                  Past Medical History:  Diagnosis Date  . Anxiety   . Depression   . Kidney stone     Past Surgical History:  Procedure Laterality Date  . CHOLECYSTECTOMY  2002  . TUBAL LIGATION  1999  . WRIST SURGERY      Family History  Problem Relation Age of Onset  . Hypertension Other   . Cancer Other     Social History  Substance Use Topics  . Smoking status: Never Smoker  . Smokeless tobacco: Never Used  . Alcohol use 0.6 oz/week    1 Glasses of wine per week     Comment: one glass wine twice monthly    Allergies: No Known Allergies  Prescriptions Prior to Admission  Medication Sig Dispense Refill Last Dose  . levonorgestrel (MIRENA) 20 MCG/24HR IUD 1 each by Intrauterine route once.   Continuous  . Oxcarbazepine (TRILEPTAL) 300 MG tablet Take 300 mg by mouth at bedtime.    08/19/2016 at Unknown time  . benzonatate (TESSALON) 100 MG capsule Take 2 capsules (200 mg total) by mouth 3 (three) times daily as needed for cough. 21 capsule 0 Has not started  . naproxen (NAPROSYN) 500 MG tablet Take 1 tablet (500 mg total) by mouth 2 (two) times daily with a meal. 14 tablet 0 Has not started.    Review of Systems  Constitutional: Negative for fever.  Gastrointestinal: Positive for abdominal pain, nausea and vomiting. Negative for constipation and diarrhea.  Genitourinary: Negative  for dysuria, frequency, urgency, vaginal bleeding and vaginal discharge.   Physical Exam   Blood pressure 147/78, pulse 87, temperature 97.7 F (36.5 C), temperature source Oral, resp. rate 25, last menstrual period 07/23/2016, SpO2 100 %.  Physical Exam  Nursing note and vitals reviewed. Constitutional: She is oriented to person, place, and time. She appears well-developed and well-nourished. No distress.  Appears uncomfortable  HENT:  Head: Normocephalic and atraumatic.  Cardiovascular: Normal rate.   Respiratory: Effort normal.  GI: Soft. Bowel sounds are normal. She exhibits no distension and no mass. There is tenderness. There is no rebound and no guarding.  Above the umbilicus there is a palpable mass that is very tender to palpation concerning for hernia. No erythema or discoloration.   Neurological: She is alert and oriented to person, place, and time.  Skin: Skin is warm and dry. No erythema.  Psychiatric: She has a normal mood and affect.    Results for orders placed or performed during the hospital encounter of 08/20/16 (from the past 24 hour(s))  CBC with Differential/Platelet     Status: None   Collection Time: 08/20/16  3:15 PM  Result Value Ref Range   WBC 7.7 4.0 -  10.5 K/uL   RBC 4.53 3.87 - 5.11 MIL/uL   Hemoglobin 13.0 12.0 - 15.0 g/dL   HCT 39.2 36.0 - 46.0 %   MCV 86.5 78.0 - 100.0 fL   MCH 28.7 26.0 - 34.0 pg   MCHC 33.2 30.0 - 36.0 g/dL   RDW 13.5 11.5 - 15.5 %   Platelets 377 150 - 400 K/uL   Neutrophils Relative % 74 %   Neutro Abs 5.6 1.7 - 7.7 K/uL   Lymphocytes Relative 24 %   Lymphs Abs 1.9 0.7 - 4.0 K/uL   Monocytes Relative 2 %   Monocytes Absolute 0.2 0.1 - 1.0 K/uL   Eosinophils Relative 0 %   Eosinophils Absolute 0.0 0.0 - 0.7 K/uL   Basophils Relative 0 %   Basophils Absolute 0.0 0.0 - 0.1 K/uL  Comprehensive metabolic panel     Status: Abnormal   Collection Time: 08/20/16  3:15 PM  Result Value Ref Range   Sodium 137 135 - 145 mmol/L    Potassium 4.0 3.5 - 5.1 mmol/L   Chloride 108 101 - 111 mmol/L   CO2 20 (L) 22 - 32 mmol/L   Glucose, Bld 148 (H) 65 - 99 mg/dL   BUN 12 6 - 20 mg/dL   Creatinine, Ser 0.65 0.44 - 1.00 mg/dL   Calcium 9.1 8.9 - 10.3 mg/dL   Total Protein 7.4 6.5 - 8.1 g/dL   Albumin 4.3 3.5 - 5.0 g/dL   AST 19 15 - 41 U/L   ALT 17 14 - 54 U/L   Alkaline Phosphatase 43 38 - 126 U/L   Total Bilirubin 1.0 0.3 - 1.2 mg/dL   GFR calc non Af Amer >60 >60 mL/min   GFR calc Af Amer >60 >60 mL/min   Anion gap 9 5 - 15    MAU Course  Procedures None  MDM CBC, CMP today  1 mg IM Dilaudid and 8 mg ODT Zofran given  Patient is still uncomfortable. Unable to reduce area of herniation even after pain medication.  Discussed patient with Dr. Gilford Raid at [facility], she will accept patient transfer for further evaluation.   Assessment and Plan  A: Incisional Hernia vs abdominal mass Nausea and vomiting  P: Transfer to [facility] for further evaluation   Luvenia Redden, PA-C  08/20/2016, 3:57 PM

## 2016-08-20 NOTE — MAU Note (Signed)
[facility] here to transport pt.

## 2016-09-03 ENCOUNTER — Ambulatory Visit: Payer: Self-pay | Admitting: Surgery

## 2016-09-03 NOTE — H&P (Signed)
Joanna Osborn 09/03/2016 10:25 AM Location: [facility] Patient #: [id number] DOB: Apr 25, 1971 Married / Language: English / Race: Black or African American Female  History of Present Illness (Taneal Sonntag A. Kae Heller MD; 09/03/2016 10:45 AM) Patient words: This is a very pleasant 46 year old woman who is referred for repair of ventral hernia. She had previous noticed a small nodule just above her umbilicus but it had not really bothered her until the end of January, when she had an acute episode of abdominal pain with associated nausea and vomiting and increased size and pain at the site of the hernia. She went to the emergency department where the hernia was successfully reduced, and she's not had any issues since then but does desire to be repaired at this point. She has previously had a laparoscopic cholecystectomy and tubal ligation.  She is not a smoker. 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  The patient is a 46 year old female.   Past Surgical History Nance Pear, [place]; 09/03/2016 10:26 AM) Gallbladder Surgery - Laparoscopic Oral Surgery  Diagnostic Studies History Nance Pear, [place]; 09/03/2016 10:26 AM) Colonoscopy >10 years ago Mammogram 1-3 years ago Pap Smear 1-5 years ago  Allergies Nance Pear, CMA; 09/03/2016 10:26 AM) No Known Drug Allergies 09/03/2016 Allergies Reconciled  Medication History Nance Pear, CMA; 09/03/2016 10:28 AM) Lavella Lemons Perles (100MG  Capsule, Oral daily) Active. Colace (100MG  Capsule, Oral daily) Active. Mirena (52 MG) (20MCG/24HR IUD, Intrauterine daily) Active. Trileptal (300MG  Tablet, Oral daily) Active. Medications Reconciled  Social History Nance Pear, [place]; 09/03/2016 10:26 AM) Alcohol use Occasional alcohol use. Caffeine use Carbonated beverages, Coffee, Tea. No drug use Tobacco use Never smoker.  Family History Nance Pear, [place]; 09/03/2016 10:26 AM) Colon Cancer Family Members  In General. Depression Family Members In General. Diabetes Mellitus Family Members In General, Mother. Heart Disease Family Members In General. Hypertension Family Members In General, Mother. Kidney Disease Family Members In General.  Pregnancy / Birth History Nance Pear, [place]; 09/03/2016 10:26 AM) Age at menarche 41 years. Contraceptive History Intrauterine device. Gravida 2 Length (months) of breastfeeding 3-6 Maternal age 29-25 Para 2 Regular periods  Other Problems Nance Pear, [place]; 09/03/2016 10:26 AM) Anxiety Disorder Cholelithiasis Kidney Stone Ventral Hernia Repair     Review of Systems Nance Pear CMA; 09/03/2016 10:26 AM) General Not Present- Appetite Loss, Chills, Fatigue, Fever, Night Sweats, Weight Gain and Weight Loss. Skin Not Present- Change in Wart/Mole, Dryness, Hives, Jaundice, New Lesions, Non-Healing Wounds, Rash and Ulcer. HEENT Not Present- Earache, Hearing Loss, Hoarseness, Nose Bleed, Oral Ulcers, Ringing in the Ears, Seasonal Allergies, Sinus Pain, Sore Throat, Visual Disturbances, Wears glasses/contact lenses and Yellow Eyes. Respiratory Not Present- Bloody sputum, Chronic Cough, Difficulty Breathing, Snoring and Wheezing. Breast Not Present- Breast Mass, Breast Pain, Nipple Discharge and Skin Changes. Cardiovascular Not Present- Chest Pain, Difficulty Breathing Lying Down, Leg Cramps, Palpitations, Rapid Heart Rate, Shortness of Breath and Swelling of Extremities. Gastrointestinal Not Present- Abdominal Pain, Bloating, Bloody Stool, Change in Bowel Habits, Chronic diarrhea, Constipation, Difficulty Swallowing, Excessive gas, Gets full quickly at meals, Hemorrhoids, Indigestion, Nausea, Rectal Pain and Vomiting. Female Genitourinary Not Present- Frequency, Nocturia, Painful Urination, Pelvic Pain and Urgency. Musculoskeletal Not Present- Back Pain, Joint Pain, Joint Stiffness, Muscle Pain, Muscle Weakness and Swelling of  Extremities. Neurological Not Present- Decreased Memory, Fainting, Headaches, Numbness, Seizures, Tingling, Tremor, Trouble walking and Weakness. Psychiatric Present- Anxiety and Depression. Not Present- Bipolar, Change in Sleep Pattern, Fearful and Frequent crying. Endocrine Not  Present- Cold Intolerance, Excessive Hunger, Hair Changes, Heat Intolerance, Hot flashes and New Diabetes. Hematology Not Present- Blood Thinners, Easy Bruising, Excessive bleeding, Gland problems, HIV and Persistent Infections.  Vitals Bary Castilla Bradford CMA; 09/03/2016 10:28 AM) 09/03/2016 10:28 AM Weight: 214.4 lb Height: 65in Body Surface Area: 2.04 m Body Mass Index: 35.68 kg/m  Temp.: 98.29F  BP: 142/88 (Sitting, Left Arm, Standard)      Physical Exam (Chevon Laufer A. Kae Heller MD; 09/03/2016 10:48 AM)  General Note: Alert and oriented, well-appearing  Integumentary Note: No lesions or rashes of limited skin exam  Head and Neck Note: No mass or thyromegaly  Eye Note: Anicteric, extra ocular motions intact  ENMT Note: moist mucus membranes, good dentition  Chest and Lung Exam Note: Labored respirations, symmetrical air entry  Cardiovascular Note: Regular rate and rhythm, no pedal edema  Abdomen Note: Obese, soft, nontender. No mass or organomegaly. She has a vague nonreducible nodule just superior to the umbilicus as well as a small reduced umbilical hernia. On review of prior CT scans, most recently 2015, she does have a supraumbilical hernia with about a 2cm aperture and incarcerated fat.  Neurologic Note: Grossly normal, normal gait  Neuropsychiatric Note: Normal mood and affect, appropriate insight  Musculoskeletal Note: Strength symmetrical throughout, no deformity    Assessment & Plan (Jeremie Giangrande A. Nyzier Boivin MD; 09/03/2016 10:49 AM)  EPIGASTRIC HERNIA (K43.9) Story: Has slowly increased in size over serial imaging and has now had an episode of incarceration. Given her  habitus, and the presence of the nearby umbilical hernia I offered her a laparoscopic repair with mesh. We discussed the risks of surgery including bleeding, infection, pain, scarring, injury to intra-abdominal structures, hernia recurrence and formation of scar tissue. She expressed understanding and asked appropriate questions. We will plan for surgical repair in the coming weeks.

## 2016-09-28 ENCOUNTER — Encounter (HOSPITAL_COMMUNITY): Payer: Self-pay | Admitting: Family Medicine

## 2016-09-28 ENCOUNTER — Ambulatory Visit (HOSPITAL_COMMUNITY)
Admission: EM | Admit: 2016-09-28 | Discharge: 2016-09-28 | Disposition: A | Discharge status: Home / Self Care | Payer: Federal, State, Local not specified - PPO | Attending: Family Medicine | Admitting: Family Medicine

## 2016-09-28 DIAGNOSIS — J0100 Acute maxillary sinusitis, unspecified: Secondary | ICD-10-CM | POA: Diagnosis not present

## 2016-09-28 DIAGNOSIS — J40 Bronchitis, not specified as acute or chronic: Secondary | ICD-10-CM | POA: Diagnosis not present

## 2016-09-28 MED ORDER — AMOXICILLIN-POT CLAVULANATE 875-125 MG PO TABS: 1.0000 | ORAL_TABLET | Freq: Two times a day (BID) | ORAL | 0 refills | Status: DC

## 2016-09-28 MED ORDER — HYDROCODONE-HOMATROPINE 5-1.5 MG/5ML PO SYRP: 5.0000 mL | ORAL_SOLUTION | Freq: Four times a day (QID) | ORAL | 0 refills | Status: DC | PRN

## 2016-09-28 NOTE — ED Provider Notes (Signed)
[place]    CSN: [id number] Arrival date & time: 09/28/16  1004     History   Chief Complaint Chief Complaint  Patient presents with  . Cough    HPI Joanna Osborn is a 46 y.o. female.   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 She was recently seen by her primary care doctor who diagnosed with a viral syndrome. Nevertheless she's continued to have the cough and feel rundown with the above symptoms.  She's had no GI symptoms.      Past Medical History:  Diagnosis Date  . Anxiety   . Depression   . Kidney stone     Patient Active Problem List   Diagnosis Date Noted  . Suicide ideation 07/07/2013  . Major depressive disorder, recurrent severe without psychotic features ([place]) 07/06/2013    Past Surgical History:  Procedure Laterality Date  . CHOLECYSTECTOMY  2002  . TUBAL LIGATION  1999  . WRIST SURGERY      OB History    Gravida Para Term Preterm AB Living   2 2 2     2    SAB TAB Ectopic Multiple Live Births                   Home Medications    Prior to Admission medications   Medication Sig Start Date End Date Taking? Authorizing Provider  levonorgestrel (MIRENA) 20 MCG/24HR IUD 1 each by Intrauterine route once.   Yes Historical Provider, MD  Oxcarbazepine (TRILEPTAL) 300 MG tablet Take 300 mg by mouth at bedtime.  05/25/16  Yes Historical Provider, MD  amoxicillin-clavulanate (AUGMENTIN) 875-125 MG tablet Take 1 tablet by mouth every 12 (twelve) hours. 09/28/16   Robyn Haber, MD  HYDROcodone-homatropine (HYDROMET) 5-1.5 MG/5ML syrup Take 5 mLs by mouth every 6 (six) hours as needed for cough. 09/28/16   Robyn Haber, MD    Family History Family History  Problem Relation Age of Onset  . Hypertension Other   . Cancer Other     Social History Social History  Substance Use Topics  . Smoking status: Never Smoker  . Smokeless tobacco: Never Used  . Alcohol use  0.6 oz/week    1 Glasses of wine per week     Comment: one glass wine twice monthly     Allergies   Patient has no known allergies.   Review of Systems Review of Systems  Constitutional: Negative.   HENT: Positive for congestion and ear pain.   Respiratory: Positive for cough.   Gastrointestinal: Negative.   Neurological: Negative.      Physical Exam Triage Vital Signs ED Triage Vitals [09/28/16 1019]  Enc Vitals Group     BP 141/89     Pulse Rate 95     Resp 12     Temp 98.4 F (36.9 C)     Temp Source Oral     SpO2 100 %     Weight      Height      Head Circumference      Peak Flow      Pain Score      Pain Loc      Pain Edu?      Excl. in [place]?    No data found.   Updated Vital Signs BP 141/89 (BP Location: Left Arm)   Pulse 95   Temp 98.4 F (36.9 C) (Oral)   Resp 12  SpO2 100%    Physical Exam  Constitutional: She is oriented to person, place, and time. She appears well-developed and well-nourished.  HENT:  Right Ear: External ear normal.  Left Ear: External ear normal.  Mouth/Throat: Oropharynx is clear and moist.  Purulent discharge in both nasal passages  Eyes: Conjunctivae are normal. Pupils are equal, round, and reactive to light.  Neck: Normal range of motion. Neck supple.  Cardiovascular: Normal rate, regular rhythm and normal heart sounds.   Pulmonary/Chest: Effort normal and breath sounds normal.  Musculoskeletal: Normal range of motion.  Neurological: She is alert and oriented to person, place, and time.  Skin: Skin is warm and dry.  Nursing note and vitals reviewed.    UC Treatments / Results  Labs (all labs ordered are listed, but only abnormal results are displayed) Labs Reviewed - No data to display  EKG  EKG Interpretation None       Radiology No results found.  Procedures Procedures (including critical care time)  Medications Ordered in UC Medications - No data to display   Initial Impression / Assessment  and Plan / UC Course  I have reviewed the triage vital signs and the nursing notes.  Pertinent labs & imaging results that were available during my care of the patient were reviewed by me and considered in my medical decision making (see chart for details).     Final Clinical Impressions(s) / UC Diagnoses   Final diagnoses:  Bronchitis  Acute maxillary sinusitis, recurrence not specified    New Prescriptions New Prescriptions   AMOXICILLIN-CLAVULANATE (AUGMENTIN) 875-125 MG TABLET    Take 1 tablet by mouth every 12 (twelve) hours.   HYDROCODONE-HOMATROPINE (HYDROMET) 5-1.5 MG/5ML SYRUP    Take 5 mLs by mouth every 6 (six) hours as needed for cough.     Robyn Haber, MD 09/28/16 1032

## 2016-09-28 NOTE — ED Triage Notes (Signed)
The patient presented to the [facility] with a complaint of aq cough and sinus drainage x 4 weeks.

## 2016-10-20 NOTE — Pre-Procedure Instructions (Signed)
Joanna Osborn  10/20/2016      [id number] [address], [place] [address] Phone: [phone] Fax: [phone]    Your procedure is scheduled on April 4.  Report to [facility] Admitting at 830 A.M.  Call this number if you have problems the morning of surgery:  [phone]   Remember:  Do not eat food or drink liquids after midnight.  Take these medicines the morning of surgery with A SIP OF WATER aripiprazole (Abilify) and Oxcarbazepine (Trileptal)  Stop taking aspirin, BC's, Goody's, Herbal medications,  Fish Oil, Vitamins, Ibuprofen, Advil, motrin, Aleve   Do not wear jewelry, make-up or nail polish.  Do not wear lotions, powders, or perfumes, or deoderant.  Do not shave 48 hours prior to surgery.  Men may shave face and neck.  Do not bring valuables to the hospital.  [facility] is not responsible for any belongings or valuables.  Contacts, dentures or bridgework may not be worn into surgery.  Leave your suitcase in the car.  After surgery it may be brought to your room.  For patients admitted to the hospital, discharge time will be determined by your treatment team.  Patients discharged the day of surgery will not be allowed to drive home.    Special instructions:  [facility] - Preparing for Surgery  Before surgery, you can play an important role.  Because skin is not sterile, your skin needs to be as free of germs as possible.  You can reduce the number of germs on you skin by washing with CHG (chlorahexidine gluconate) soap before surgery.  CHG is an antiseptic cleaner which kills germs and bonds with the skin to continue killing germs even after washing.  Please DO NOT use if you have an allergy to CHG or antibacterial soaps.  If your skin becomes reddened/irritated stop using the CHG and inform your nurse when you arrive at Short Stay.  Do not shave (including legs and underarms) for at  least 48 hours prior to the first CHG shower.  You may shave your face.  Please follow these instructions carefully:   1.  Shower with CHG Soap the night before surgery and the   morning of Surgery.  2.  If you choose to wash your hair, wash your hair first as usual with your  normal shampoo.  3.  After you shampoo, rinse your hair and body thoroughly to remove the Shampoo.  4.  Use CHG as you would any other liquid soap.  You can apply chg directly  to the skin and wash gently with scrungie or a clean washcloth.  5.  Apply the CHG Soap to your body ONLY FROM THE NECK DOWN.  Do not use on open wounds or open sores.  Avoid contact with your eyes,  ears, mouth and genitals (private parts).  Wash genitals (private parts)  with your normal soap.  6.  Wash thoroughly, paying special attention to the area where your surgery will be performed.  7.  Thoroughly rinse your body with warm water from the neck down.  8.  DO NOT shower/wash with your normal soap after using and rinsing off  the CHG Soap.  9.  Pat yourself dry with a clean towel.            10.  Wear clean pajamas.            11.  Place clean  sheets on your bed the night of your first shower and do not  sleep with pets.  Day of Surgery  Do not apply any lotions/deoderants the morning of surgery.  Please wear clean clothes to the hospital/surgery center.     Please read over the following fact sheets that you were given. Pain Booklet, Coughing and Deep Breathing and Surgical Site Infection Prevention

## 2016-10-21 ENCOUNTER — Encounter (HOSPITAL_COMMUNITY): Payer: Self-pay

## 2016-10-21 ENCOUNTER — Encounter (HOSPITAL_COMMUNITY)
Admission: RE | Admit: 2016-10-21 | Discharge: 2016-10-21 | Disposition: A | Discharge status: Home / Self Care | Payer: Federal, State, Local not specified - PPO | Source: Ambulatory Visit | Attending: Surgery | Admitting: Surgery

## 2016-10-21 DIAGNOSIS — K439 Ventral hernia without obstruction or gangrene: Secondary | ICD-10-CM | POA: Insufficient documentation

## 2016-10-21 DIAGNOSIS — Z01812 Encounter for preprocedural laboratory examination: Secondary | ICD-10-CM | POA: Diagnosis not present

## 2016-10-21 HISTORY — DX: Personal history of urinary calculi: Z87.442

## 2016-10-21 LAB — BASIC METABOLIC PANEL
ANION GAP: 10 (ref 5–15)
BUN: 10 mg/dL (ref 6–20)
CHLORIDE: 110 mmol/L (ref 101–111)
CO2: 21 mmol/L — ABNORMAL LOW (ref 22–32)
Calcium: 9.4 mg/dL (ref 8.9–10.3)
Creatinine, Ser: 0.61 mg/dL (ref 0.44–1.00)
GFR calc non Af Amer: >60 mL/min (ref >60)
Glucose, Bld: 116 mg/dL — ABNORMAL HIGH (ref 65–99)
POTASSIUM: 4.1 mmol/L (ref 3.5–5.1)
SODIUM: 141 mmol/L (ref 135–145)

## 2016-10-21 LAB — CBC
HCT: 39.9 % (ref 36.0–46.0)
HEMOGLOBIN: 12.7 g/dL (ref 12.0–15.0)
MCH: 28.5 pg (ref 26.0–34.0)
MCHC: 31.8 g/dL (ref 30.0–36.0)
MCV: 89.7 fL (ref 78.0–100.0)
Platelets: 419 10*3/uL — ABNORMAL HIGH (ref 150–400)
RBC: 4.45 MIL/uL (ref 3.87–5.11)
RDW: 13.3 % (ref 11.5–15.5)
WBC: 7.1 10*3/uL (ref 4.0–10.5)

## 2016-10-21 NOTE — Progress Notes (Addendum)
PCP is Dr. Irven Shelling Denies ever seeing a cardiologist. Denies any chest pain. Denies ever having a stress test, echo, or card cath. Rechecked BP 159/99.

## 2016-10-21 NOTE — Progress Notes (Signed)
Lab called blood for HCG test was hemolyzed and will need to be collected on the day of surgery. New order placed.

## 2016-10-23 NOTE — Progress Notes (Signed)
Anesthesia Chart Review:  Pt is a 46 year old female scheduled for laparoscopic ventral hernia repair, insertion of mesh on 10/28/2016 with Romana Juniper, M.D.  - PCP is Lin Landsman, MD  PMH includes: Depression, kidney stones. Never smoker. BMI 36.  BP (!) 145/96   Pulse 90   Temp 36.7 C   Resp 20   Ht 5\' 5"  (1.651 m)   Wt 216 lb (98 kg)   SpO2 100%   BMI 35.94 kg/m    BP on recheck was 159/99 Prior BP readings in Epic:  09/28/16: 141/89 08/20/16: 134/85 08/19/16: 135/86  Medications include: abilify, trileptal  Preoperative labs reviewed.    EKG 08/19/16: NSR  I spoke with pt by telephone about elevated BP.  Recommended f/u with PCP for eval, pt may have HTN that needs treatment.   If no changes, I anticipate pt can proceed with surgery as scheduled.   Willeen Cass, FNP-BC [facility] Short Stay Surgical Center/Anesthesiology Phone: [phone] 10/23/2016 4:10 PM

## 2016-10-28 ENCOUNTER — Ambulatory Visit (HOSPITAL_COMMUNITY)
Admission: RE | Admit: 2016-10-28 | Discharge: 2016-10-28 | Disposition: A | Discharge status: Home / Self Care | Payer: Federal, State, Local not specified - PPO | Source: Ambulatory Visit | Attending: Surgery | Admitting: Surgery

## 2016-10-28 ENCOUNTER — Encounter (HOSPITAL_COMMUNITY): Payer: Self-pay

## 2016-10-28 ENCOUNTER — Ambulatory Visit (HOSPITAL_COMMUNITY): Payer: Federal, State, Local not specified - PPO | Admitting: Anesthesiology

## 2016-10-28 ENCOUNTER — Ambulatory Visit (HOSPITAL_COMMUNITY): Payer: Federal, State, Local not specified - PPO | Admitting: Emergency Medicine

## 2016-10-28 ENCOUNTER — Encounter (HOSPITAL_COMMUNITY)
Admission: RE | Disposition: A | Discharge status: Home / Self Care | Payer: Self-pay | Source: Ambulatory Visit | Attending: Surgery

## 2016-10-28 DIAGNOSIS — K429 Umbilical hernia without obstruction or gangrene: Secondary | ICD-10-CM | POA: Diagnosis not present

## 2016-10-28 DIAGNOSIS — D259 Leiomyoma of uterus, unspecified: Secondary | ICD-10-CM | POA: Insufficient documentation

## 2016-10-28 DIAGNOSIS — Z79899 Other long term (current) drug therapy: Secondary | ICD-10-CM | POA: Insufficient documentation

## 2016-10-28 DIAGNOSIS — Z9851 Tubal ligation status: Secondary | ICD-10-CM | POA: Insufficient documentation

## 2016-10-28 DIAGNOSIS — Z9049 Acquired absence of other specified parts of digestive tract: Secondary | ICD-10-CM | POA: Insufficient documentation

## 2016-10-28 DIAGNOSIS — K436 Other and unspecified ventral hernia with obstruction, without gangrene: Secondary | ICD-10-CM | POA: Diagnosis not present

## 2016-10-28 HISTORY — PX: VENTRAL HERNIA REPAIR: SHX424

## 2016-10-28 HISTORY — PX: INSERTION OF MESH: SHX5868

## 2016-10-28 LAB — HCG, SERUM, QUALITATIVE: Preg, Serum: NEGATIVE

## 2016-10-28 SURGERY — REPAIR, HERNIA, VENTRAL, LAPAROSCOPIC
Anesthesia: General | Site: Abdomen

## 2016-10-28 MED ORDER — ACETAMINOPHEN 500 MG PO TABS
1000.0000 mg | ORAL_TABLET | ORAL | Status: DC
  Filled 2016-10-28: qty 2

## 2016-10-28 MED ORDER — PHENYLEPHRINE HCL 10 MG/ML IJ SOLN
INTRAMUSCULAR | Status: DC | PRN
  Administered 2016-10-28: 50 ug/min via INTRAVENOUS

## 2016-10-28 MED ORDER — ONDANSETRON HCL 4 MG/2ML IJ SOLN
INTRAMUSCULAR | Status: AC
  Filled 2016-10-28: qty 2

## 2016-10-28 MED ORDER — GABAPENTIN 300 MG PO CAPS
300.0000 mg | ORAL_CAPSULE | ORAL | Status: DC
  Filled 2016-10-28: qty 1

## 2016-10-28 MED ORDER — ROCURONIUM BROMIDE 50 MG/5ML IV SOSY
PREFILLED_SYRINGE | INTRAVENOUS | Status: AC
  Filled 2016-10-28: qty 5

## 2016-10-28 MED ORDER — SODIUM CHLORIDE 0.9% FLUSH: 3.0000 mL | Freq: Two times a day (BID) | INTRAVENOUS | Status: DC

## 2016-10-28 MED ORDER — DOCUSATE SODIUM 100 MG PO CAPS: 100.0000 mg | ORAL_CAPSULE | Freq: Two times a day (BID) | ORAL | 0 refills | Status: AC

## 2016-10-28 MED ORDER — LIDOCAINE HCL (CARDIAC) 20 MG/ML IV SOLN
INTRAVENOUS | Status: DC | PRN
  Administered 2016-10-28: 100 mg via INTRAVENOUS

## 2016-10-28 MED ORDER — BUPIVACAINE-EPINEPHRINE 0.25% -1:200000 IJ SOLN
INTRAMUSCULAR | Status: DC | PRN
  Administered 2016-10-28: 20 mL

## 2016-10-28 MED ORDER — CEFAZOLIN SODIUM-DEXTROSE 2-4 GM/100ML-% IV SOLN
2.0000 g | INTRAVENOUS | Status: AC
  Administered 2016-10-28: 2 g via INTRAVENOUS
  Filled 2016-10-28: qty 100

## 2016-10-28 MED ORDER — ACETAMINOPHEN 10 MG/ML IV SOLN
INTRAVENOUS | Status: AC
  Filled 2016-10-28: qty 100

## 2016-10-28 MED ORDER — ONDANSETRON HCL 4 MG/2ML IJ SOLN
INTRAMUSCULAR | Status: DC | PRN
  Administered 2016-10-28: 4 mg via INTRAVENOUS

## 2016-10-28 MED ORDER — SODIUM CHLORIDE 0.9% FLUSH: 3.0000 mL | INTRAVENOUS | Status: DC | PRN

## 2016-10-28 MED ORDER — SODIUM CHLORIDE 0.9 % IV SOLN: 250.0000 mL | INTRAVENOUS | Status: DC | PRN

## 2016-10-28 MED ORDER — OXYCODONE HCL 5 MG PO TABS
5.0000 mg | ORAL_TABLET | ORAL | Status: DC | PRN
  Administered 2016-10-28: 10 mg via ORAL

## 2016-10-28 MED ORDER — BUPIVACAINE HCL (PF) 0.25 % IJ SOLN
INTRAMUSCULAR | Status: AC
  Filled 2016-10-28: qty 30

## 2016-10-28 MED ORDER — 0.9 % SODIUM CHLORIDE (POUR BTL) OPTIME
TOPICAL | Status: DC | PRN
  Administered 2016-10-28: 1000 mL

## 2016-10-28 MED ORDER — MIDAZOLAM HCL 5 MG/5ML IJ SOLN
INTRAMUSCULAR | Status: DC | PRN
  Administered 2016-10-28: 2 mg via INTRAVENOUS

## 2016-10-28 MED ORDER — DEXAMETHASONE SODIUM PHOSPHATE 10 MG/ML IJ SOLN
INTRAMUSCULAR | Status: DC | PRN
  Administered 2016-10-28: 10 mg via INTRAVENOUS

## 2016-10-28 MED ORDER — CHLORHEXIDINE GLUCONATE CLOTH 2 % EX PADS: 6.0000 | MEDICATED_PAD | Freq: Once | CUTANEOUS | Status: DC

## 2016-10-28 MED ORDER — PROPOFOL 10 MG/ML IV BOLUS
INTRAVENOUS | Status: AC
  Filled 2016-10-28: qty 20

## 2016-10-28 MED ORDER — LIDOCAINE 2% (20 MG/ML) 5 ML SYRINGE
INTRAMUSCULAR | Status: AC
  Filled 2016-10-28: qty 5

## 2016-10-28 MED ORDER — ACETAMINOPHEN 650 MG RE SUPP: 650.0000 mg | RECTAL | Status: DC | PRN

## 2016-10-28 MED ORDER — FENTANYL CITRATE (PF) 100 MCG/2ML IJ SOLN
INTRAMUSCULAR | Status: DC | PRN
  Administered 2016-10-28: 50 ug via INTRAVENOUS
  Administered 2016-10-28: 100 ug via INTRAVENOUS
  Administered 2016-10-28 (×2): 50 ug via INTRAVENOUS

## 2016-10-28 MED ORDER — ACETAMINOPHEN 10 MG/ML IV SOLN
INTRAVENOUS | Status: DC | PRN
  Administered 2016-10-28: 1000 mg via INTRAVENOUS

## 2016-10-28 MED ORDER — FENTANYL CITRATE (PF) 100 MCG/2ML IJ SOLN: 25.0000 ug | INTRAMUSCULAR | Status: DC | PRN

## 2016-10-28 MED ORDER — PHENYLEPHRINE HCL 10 MG/ML IJ SOLN
INTRAMUSCULAR | Status: DC | PRN
  Administered 2016-10-28 (×3): 80 ug via INTRAVENOUS

## 2016-10-28 MED ORDER — PROPOFOL 10 MG/ML IV BOLUS
INTRAVENOUS | Status: DC | PRN
  Administered 2016-10-28: 150 mg via INTRAVENOUS

## 2016-10-28 MED ORDER — ROCURONIUM BROMIDE 100 MG/10ML IV SOLN
INTRAVENOUS | Status: DC | PRN
  Administered 2016-10-28: 50 mg via INTRAVENOUS

## 2016-10-28 MED ORDER — PHENYLEPHRINE 40 MCG/ML (10ML) SYRINGE FOR IV PUSH (FOR BLOOD PRESSURE SUPPORT)
PREFILLED_SYRINGE | INTRAVENOUS | Status: AC
  Filled 2016-10-28: qty 10

## 2016-10-28 MED ORDER — FENTANYL CITRATE (PF) 250 MCG/5ML IJ SOLN
INTRAMUSCULAR | Status: AC
  Filled 2016-10-28: qty 5

## 2016-10-28 MED ORDER — MIDAZOLAM HCL 2 MG/2ML IJ SOLN
INTRAMUSCULAR | Status: AC
  Filled 2016-10-28: qty 2

## 2016-10-28 MED ORDER — OXYCODONE HCL 5 MG PO TABS
ORAL_TABLET | ORAL | Status: AC
  Filled 2016-10-28: qty 2

## 2016-10-28 MED ORDER — ACETAMINOPHEN 325 MG PO TABS: 650.0000 mg | ORAL_TABLET | ORAL | Status: DC | PRN

## 2016-10-28 MED ORDER — LACTATED RINGERS IV SOLN
INTRAVENOUS | Status: DC
Start: 2016-10-28 — End: 2016-10-28
  Administered 2016-10-28 (×3): via INTRAVENOUS

## 2016-10-28 MED ORDER — OXYCODONE-ACETAMINOPHEN 5-325 MG PO TABS: 1.0000 | ORAL_TABLET | Freq: Four times a day (QID) | ORAL | 0 refills | Status: DC | PRN

## 2016-10-28 MED ORDER — CELECOXIB 200 MG PO CAPS
400.0000 mg | ORAL_CAPSULE | ORAL | Status: DC
  Filled 2016-10-28: qty 2

## 2016-10-28 MED ORDER — SUGAMMADEX SODIUM 200 MG/2ML IV SOLN
INTRAVENOUS | Status: DC | PRN
  Administered 2016-10-28: 200 mg via INTRAVENOUS

## 2016-10-28 SURGICAL SUPPLY — 57 items
ADH SKN CLS APL DERMABOND .7 (GAUZE/BANDAGES/DRESSINGS) ×1
APPLIER CLIP 5 13 M/L LIGAMAX5 (MISCELLANEOUS)
APR CLP MED LRG 5 ANG JAW (MISCELLANEOUS)
BINDER ABD UNIV 10 28-50 (GAUZE/BANDAGES/DRESSINGS) IMPLANT
BINDER ABD UNIV 12 45-62 (WOUND CARE) ×1 IMPLANT
BINDER ABDOM UNIV 10 (GAUZE/BANDAGES/DRESSINGS) ×3
BINDER ABDOMINAL 46IN 62IN (WOUND CARE) ×3
BLADE CLIPPER SURG (BLADE) IMPLANT
CANISTER SUCT 3000ML PPV (MISCELLANEOUS) IMPLANT
CHLORAPREP W/TINT 26ML (MISCELLANEOUS) ×3 IMPLANT
CLIP APPLIE 5 13 M/L LIGAMAX5 (MISCELLANEOUS) IMPLANT
COVER SURGICAL LIGHT HANDLE (MISCELLANEOUS) ×3 IMPLANT
DERMABOND ADVANCED (GAUZE/BANDAGES/DRESSINGS) ×2
DERMABOND ADVANCED .7 DNX12 (GAUZE/BANDAGES/DRESSINGS) ×1 IMPLANT
DEVICE PMI PUNCTURE CLOSURE (MISCELLANEOUS) ×3 IMPLANT
DEVICE SECURE STRAP 25 ABSORB (INSTRUMENTS) ×3 IMPLANT
DEVICE TROCAR PUNCTURE CLOSURE (ENDOMECHANICALS) IMPLANT
ELECT REM PT RETURN 9FT ADLT (ELECTROSURGICAL) ×3
ELECTRODE REM PT RTRN 9FT ADLT (ELECTROSURGICAL) ×1 IMPLANT
GLOVE BIO SURGEON STRL SZ 6 (GLOVE) ×3 IMPLANT
GLOVE BIO SURGEON STRL SZ 6.5 (GLOVE) ×1 IMPLANT
GLOVE BIO SURGEONS STRL SZ 6.5 (GLOVE) ×1
GLOVE BIOGEL PI IND STRL 6.5 (GLOVE) ×1 IMPLANT
GLOVE BIOGEL PI IND STRL 7.5 (GLOVE) IMPLANT
GLOVE BIOGEL PI INDICATOR 6.5 (GLOVE) ×8
GLOVE BIOGEL PI INDICATOR 7.5 (GLOVE) ×2
GLOVE SKINSENSE NS SZ7.0 (GLOVE) ×4
GLOVE SKINSENSE STRL SZ7.0 (GLOVE) IMPLANT
GOWN BRE IMP SLV AUR XL STRL (GOWN DISPOSABLE) ×4 IMPLANT
GOWN STRL REUS W/ TWL LRG LVL3 (GOWN DISPOSABLE) ×3 IMPLANT
GOWN STRL REUS W/TWL LRG LVL3 (GOWN DISPOSABLE) ×9
GRASPER SUT TROCAR 14GX15 (MISCELLANEOUS) ×2 IMPLANT
KIT BASIN OR (CUSTOM PROCEDURE TRAY) ×3 IMPLANT
KIT ROOM TURNOVER OR (KITS) ×3 IMPLANT
MARKER SKIN DUAL TIP RULER LAB (MISCELLANEOUS) ×3 IMPLANT
MESH VENTRALIGHT ST 4X6IN (Mesh General) ×2 IMPLANT
NDL SPNL 22GX3.5 QUINCKE BK (NEEDLE) ×1 IMPLANT
NEEDLE SPNL 22GX3.5 QUINCKE BK (NEEDLE) ×3 IMPLANT
NS IRRIG 1000ML POUR BTL (IV SOLUTION) ×3 IMPLANT
PAD ARMBOARD 7.5X6 YLW CONV (MISCELLANEOUS) ×6 IMPLANT
SCISSORS LAP 5X35 DISP (ENDOMECHANICALS) ×3 IMPLANT
SET IRRIG TUBING LAPAROSCOPIC (IRRIGATION / IRRIGATOR) IMPLANT
SHEARS HARMONIC ACE PLUS 36CM (ENDOMECHANICALS) IMPLANT
SLEEVE ENDOPATH XCEL 5M (ENDOMECHANICALS) ×6 IMPLANT
SUT ETHIBOND CT1 BRD #0 30IN (SUTURE) IMPLANT
SUT MNCRL AB 4-0 PS2 18 (SUTURE) ×3 IMPLANT
SUT NOVA NAB GS-21 0 18 T12 DT (SUTURE) IMPLANT
SUT PDS AB 2-0 CT1 27 (SUTURE) ×8 IMPLANT
TOWEL OR 17X24 6PK STRL BLUE (TOWEL DISPOSABLE) ×3 IMPLANT
TOWEL OR 17X26 10 PK STRL BLUE (TOWEL DISPOSABLE) ×3 IMPLANT
TRAY FOLEY BAG SILVER LF 16FR (CATHETERS) ×2 IMPLANT
TRAY FOLEY CATH 14FR (SET/KITS/TRAYS/PACK) IMPLANT
TRAY LAPAROSCOPIC MC (CUSTOM PROCEDURE TRAY) ×3 IMPLANT
TROCAR XCEL BLUNT TIP 100MML (ENDOMECHANICALS) IMPLANT
TROCAR XCEL NON-BLD 11X100MML (ENDOMECHANICALS) ×3 IMPLANT
TROCAR XCEL NON-BLD 5MMX100MML (ENDOMECHANICALS) ×3 IMPLANT
TUBING INSUFFLATION (TUBING) ×3 IMPLANT

## 2016-10-28 NOTE — Anesthesia Postprocedure Evaluation (Signed)
Anesthesia Post Note  Patient: Joanna Osborn  Procedure(s) Performed: Procedure(s) (LRB): LAPAROSCOPIC VENTRAL HERNIA REPAIR (N/A) INSERTION OF MESH (N/A)  Patient location during evaluation: PACU Anesthesia Type: General Level of consciousness: awake and alert Pain management: pain level controlled Vital Signs Assessment: post-procedure vital signs reviewed and stable Respiratory status: spontaneous breathing Cardiovascular status: stable Postop Assessment: no signs of nausea or vomiting Anesthetic complications: no       Last Vitals:  Vitals:   10/28/16 1249 10/28/16 1255  BP: (!) 141/94 (!) 153/81  Pulse: 85 86  Resp: (!) 22 (!) 22  Temp: 36.7 C     Last Pain:  Vitals:   10/28/16 1249  PainSc: 4                  [employer]

## 2016-10-28 NOTE — Transfer of Care (Signed)
Immediate Anesthesia Transfer of Care Note  Patient: Joanna Osborn  Procedure(s) Performed: Procedure(s): LAPAROSCOPIC VENTRAL HERNIA REPAIR (N/A) INSERTION OF MESH (N/A)  Patient Location: PACU  Anesthesia Type:General  Level of Consciousness: sedated, patient cooperative and responds to stimulation  Airway & Oxygen Therapy: Patient Spontanous Breathing and Patient connected to nasal cannula oxygen  Post-op Assessment: Report given to RN, Post -op Vital signs reviewed and stable and Patient moving all extremities  Post vital signs: Reviewed and stable  Last Vitals:  Vitals:   10/28/16 0903 10/28/16 1205  BP: (!) 146/94   Pulse: 87 93  Resp: (!) 22 19  Temp: 37.2 C 36.8 C    Last Pain: There were no vitals filed for this visit.    Patients Stated Pain Goal: 8 (47/34/03 7096)  Complications: No apparent anesthesia complications

## 2016-10-28 NOTE — H&P (Signed)
Joanna Osborn Patient #: [id number] DOB: Oct 19, 1970 Married / Language: English / Race: Black or African American Female  History of Present Illness  Patient words: This is a very pleasant 46 year old woman who is referred for repair of ventral hernia. She had previous noticed a small nodule just above her umbilicus but it had not really bothered her until the end of January, when she had an acute episode of abdominal pain with associated nausea and vomiting and increased size and pain at the site of the hernia. She went to the emergency department where the hernia was successfully reduced, and she's not had any issues since then but does desire to be repaired at this point. She has previously had a laparoscopic cholecystectomy and tubal ligation.  She is not a smoker. 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  The patient is a 46 year old female.   Past Surgical History  Gallbladder Surgery - Laparoscopic Oral Surgery  Diagnostic Studies History  Colonoscopy >10 years ago Mammogram 1-3 years ago Pap Smear 1-5 years ago  Allergies No Known Drug Allergies 09/03/2016 Allergies Reconciled  Medication History  Tessalon Perles (100MG  Capsule, Oral daily) Active. Colace (100MG  Capsule, Oral daily) Active. Mirena (52 MG) (20MCG/24HR IUD, Intrauterine daily) Active. Trileptal (300MG  Tablet, Oral daily) Active. Medications Reconciled  Social History  Alcohol use Occasional alcohol use. Caffeine use Carbonated beverages, Coffee, Tea. No drug use Tobacco use Never smoker.  Family History  Colon Cancer Family Members In General. Depression Family Members In General. Diabetes Mellitus Family Members In General, Mother. Heart Disease Family Members In General. Hypertension Family Members In General, Mother. Kidney Disease Family Members In General.  Pregnancy / Birth History  Age at menarche 57 years. Contraceptive  History Intrauterine device. Gravida 2 Length (months) of breastfeeding 3-6 Maternal age 67-25 Para 2 Regular periods  Other Problems  Anxiety Disorder Cholelithiasis Kidney Stone Ventral Hernia Repair     Review of Systems  General Not Present- Appetite Loss, Chills, Fatigue, Fever, Night Sweats, Weight Gain and Weight Loss. Skin Not Present- Change in Wart/Mole, Dryness, Hives, Jaundice, New Lesions, Non-Healing Wounds, Rash and Ulcer. HEENT Not Present- Earache, Hearing Loss, Hoarseness, Nose Bleed, Oral Ulcers, Ringing in the Ears, Seasonal Allergies, Sinus Pain, Sore Throat, Visual Disturbances, Wears glasses/contact lenses and Yellow Eyes. Respiratory Not Present- Bloody sputum, Chronic Cough, Difficulty Breathing, Snoring and Wheezing. Breast Not Present- Breast Mass, Breast Pain, Nipple Discharge and Skin Changes. Cardiovascular Not Present- Chest Pain, Difficulty Breathing Lying Down, Leg Cramps, Palpitations, Rapid Heart Rate, Shortness of Breath and Swelling of Extremities. Gastrointestinal Not Present- Abdominal Pain, Bloating, Bloody Stool, Change in Bowel Habits, Chronic diarrhea, Constipation, Difficulty Swallowing, Excessive gas, Gets full quickly at meals, Hemorrhoids, Indigestion, Nausea, Rectal Pain and Vomiting. Female Genitourinary Not Present- Frequency, Nocturia, Painful Urination, Pelvic Pain and Urgency. Musculoskeletal Not Present- Back Pain, Joint Pain, Joint Stiffness, Muscle Pain, Muscle Weakness and Swelling of Extremities. Neurological Not Present- Decreased Memory, Fainting, Headaches, Numbness, Seizures, Tingling, Tremor, Trouble walking and Weakness. Psychiatric Present- Anxiety and Depression. Not Present- Bipolar, Change in Sleep Pattern, Fearful and Frequent crying. Endocrine Not Present- Cold Intolerance, Excessive Hunger, Hair Changes, Heat Intolerance, Hot flashes and New Diabetes. Hematology Not Present- Blood Thinners, Easy  Bruising, Excessive bleeding, Gland problems, HIV and Persistent Infections.  Vitals:   10/28/16 0903  BP: (!) 146/94  Pulse: 87  Resp: (!) 22  Temp: 98.9 F (37.2 C)      Physical Exam  General Note: Alert and oriented, well-appearing  Integumentary Note: No lesions or rashes of limited skin exam  Head and Neck Note: No mass or thyromegaly  Eye Note: Anicteric, extra ocular motions intact  ENMT Note: moist mucus membranes, good dentition  Chest and Lung Exam Note: Labored respirations, symmetrical air entry  Cardiovascular Note: Regular rate and rhythm, no pedal edema  Abdomen Note: Obese, soft, nontender. No mass or organomegaly. She has a vague nonreducible nodule just superior to the umbilicus as well as a small reduced umbilical hernia. On review of prior CT scans, most recently 2015, she does have a supraumbilical hernia with about a 2cm aperture and incarcerated fat.  Neurologic Note: Grossly normal, normal gait  Neuropsychiatric Note: Normal mood and affect, appropriate insight  Musculoskeletal Note: Strength symmetrical throughout, no deformity    Assessment & Plan   EPIGASTRIC HERNIA (K43.9) Story: Has slowly increased in size over serial imaging and has now had an episode of incarceration. Given her habitus, and the presence of the nearby umbilical hernia I offered her a laparoscopic repair with mesh. We discussed the risks of surgery including bleeding, infection, pain, scarring, injury to intra-abdominal structures, hernia recurrence and formation of scar tissue. She expressed understanding and asked appropriate questions. We will plan for surgical repair in the coming weeks.

## 2016-10-28 NOTE — Anesthesia Preprocedure Evaluation (Signed)
Anesthesia Evaluation  Patient identified by MRN, date of birth, ID band Patient awake    Reviewed: Allergy & Precautions, NPO status , Patient's Chart, lab work & pertinent test results  Airway Mallampati: II  TM Distance: >3 FB Neck ROM: Full    Dental  (+) Teeth Intact, Dental Advisory Given   Pulmonary neg pulmonary ROS,    Pulmonary exam normal breath sounds clear to auscultation       Cardiovascular negative cardio ROS Normal cardiovascular exam Rhythm:Regular Rate:Normal     Neuro/Psych PSYCHIATRIC DISORDERS Anxiety Depression negative neurological ROS     GI/Hepatic negative GI ROS, Neg liver ROS,   Endo/Other  negative endocrine ROSObesity   Renal/GU negative Renal ROS     Musculoskeletal negative musculoskeletal ROS (+)   Abdominal   Peds  Hematology negative hematology ROS (+)   Anesthesia Other Findings Day of surgery medications reviewed with the patient.  Reproductive/Obstetrics negative OB ROS                             Anesthesia Physical Anesthesia Plan  ASA: II  Anesthesia Plan: General   Post-op Pain Management:    Induction: Intravenous  Airway Management Planned: Oral ETT  Additional Equipment:   Intra-op Plan:   Post-operative Plan: Extubation in OR  Informed Consent: I have reviewed the patients History and Physical, chart, labs and discussed the procedure including the risks, benefits and alternatives for the proposed anesthesia with the patient or authorized representative who has indicated his/her understanding and acceptance.   Dental advisory given  Plan Discussed with: CRNA  Anesthesia Plan Comments: (Risks/benefits of general anesthesia discussed with patient including risk of damage to teeth, lips, gum, and tongue, nausea/vomiting, allergic reactions to medications, and the possibility of heart attack, stroke and death.  All patient  questions answered.  Patient wishes to proceed.)        Anesthesia Quick Evaluation

## 2016-10-28 NOTE — Anesthesia Procedure Notes (Signed)
Procedure Name: Intubation Date/Time: 10/28/2016 10:29 AM Performed by: Rebekah Chesterfield L Pre-anesthesia Checklist: Patient identified, Emergency Drugs available, Suction available and Patient being monitored Patient Re-evaluated:Patient Re-evaluated prior to inductionOxygen Delivery Method: Circle System Utilized Preoxygenation: Pre-oxygenation with 100% oxygen Intubation Type: IV induction Ventilation: Mask ventilation without difficulty, Oral airway inserted - appropriate to patient size and Mask ventilation with difficulty Grade View: Grade III Tube type: Oral Tube size: 7.5 mm Number of attempts: 1 Airway Equipment and Method: Stylet Placement Confirmation: ETT inserted through vocal cords under direct vision,  positive ETCO2 and breath sounds checked- equal and bilateral Secured at: 22 cm Tube secured with: Tape Dental Injury: Teeth and Oropharynx as per pre-operative assessment

## 2016-10-28 NOTE — Discharge Instructions (Signed)
LAPAROSCOPIC SURGERY: POST OP INSTRUCTIONS  ######################################################################  EAT Gradually transition to a high fiber diet with a fiber supplement over the next few weeks after discharge.  Start with a pureed / full liquid diet (see below)  WALK Walk an hour a day.  Control your pain to do that.    CONTROL PAIN Control pain so that you can walk, sleep, tolerate sneezing/coughing, go up/down stairs.  HAVE A BOWEL MOVEMENT DAILY Keep your bowels regular to avoid problems.  OK to try a laxative to override constipation.  OK to use an antidairrheal to slow down diarrhea.  Call if not better after 2 tries  CALL IF YOU HAVE PROBLEMS/CONCERNS Call if you are still struggling despite following these instructions. Call if you have concerns not answered by these instructions  ######################################################################    1. DIET: Follow a light bland diet the first 24 hours after arrival home, such as soup, liquids, crackers, etc.  Be sure to include lots of fluids daily.  Avoid fast food or heavy meals as your are more likely to get nauseated.  Eat a low fat the next few days after surgery.   2. Take your usually prescribed home medications unless otherwise directed. 3. PAIN CONTROL: a. Pain is best controlled by a usual combination of three different methods TOGETHER: i. Ice/Heat ii. Over the counter pain medication iii. Prescription pain medication b. Most patients will experience some swelling and bruising around the incisions.  Ice packs or heating pads (30-60 minutes up to 6 times a day) will help. Use ice for the first few days to help decrease swelling and bruising, then switch to heat to help relax tight/sore spots and speed recovery.  Some people prefer to use ice alone, heat alone, alternating between ice & heat.  Experiment to what works for you.  Swelling and bruising can take several weeks to resolve.   c. It is  helpful to take an over-the-counter pain medication regularly for the first few weeks.  Choose one of the following that works best for you: i. Naproxen (Aleve, etc)  Two 251m tabs twice a day ii. Ibuprofen (Advil, etc) Three 2053mtabs four times a day (every meal & bedtime) iii. Acetaminophen (Tylenol, etc) 500-65044mour times a day (every meal & bedtime) d. A  prescription for pain medication (such as oxycodone, hydrocodone, etc) should be given to you upon discharge.  Take your pain medication as prescribed.  i. If you are having problems/concerns with the prescription medicine (does not control pain, nausea, vomiting, rash, itching, etc), please call us [phone] see if we need to switch you to a different pain medicine that will work better for you and/or control your side effect better. ii. If you need a refill on your pain medication, please contact your pharmacy.  They will contact our office to request authorization. Prescriptions will not be filled after 5 pm or on week-ends. 4. Avoid getting constipated.  Between the surgery and the pain medications, it is common to experience some constipation.  Increasing fluid intake and taking a fiber supplement (such as Metamucil, Citrucel, FiberCon, MiraLax, etc) 1-2 times a day regularly will usually help prevent this problem from occurring.  A mild laxative (prune juice, Milk of Magnesia, MiraLax, etc) should be taken according to package directions if there are no bowel movements after 48 hours.   5. Watch out for diarrhea.  If you have many loose bowel movements, simplify your diet to bland foods & liquids for  a few days.  Stop any stool softeners and decrease your fiber supplement.  Switching to mild anti-diarrheal medications (Kayopectate, Pepto Bismol) can help.  If this worsens or does not improve, please call us. 6. Wash / shower every day. Starting the day after surgery. You may shower over the skin glue which is waterproof.  No lotions  or ointments to incisions, and do not scrub the incisions or glue. The glue will flake off after about 2 weeks.  7. ACTIVITIES as tolerated:   a. You may resume regular (light) daily activities beginning the next day--such as daily self-care, walking, climbing stairs--gradually increasing activities as tolerated.  If you can walk 30 minutes without difficulty, it is safe to try more intense activity such as jogging, treadmill, bicycling, low-impact aerobics, swimming, etc. b. Save the most intensive and strenuous activity for last such as sit-ups, heavy lifting, contact sports, etc  Refrain from any heavy lifting or straining until you are off narcotics for pain control.   c. DO NOT PUSH THROUGH PAIN.  Let pain be your guide: If it hurts to do something, don't do it.  Pain is your body warning you to avoid that activity for another week until the pain goes down. d. You may drive when you are no longer taking prescription pain medication, you can comfortably wear a seatbelt, and you can safely maneuver your car and apply brakes. e. Dennis Bast may have sexual intercourse when it is comfortable.  8. FOLLOW UP in our office a. Please call [facility] at [phone] to set up an appointment to see your surgeon in the office for a follow-up appointment approximately 2-3 weeks after your surgery. b. Make sure that you call for this appointment the day you arrive home to insure a convenient appointment time. 10. IF YOU HAVE DISABILITY OR FAMILY LEAVE FORMS, BRING THEM TO THE OFFICE FOR PROCESSING.  DO NOT GIVE THEM TO YOUR DOCTOR.   WHEN TO CALL us [phone]: 1. Poor pain control 2. Reactions / problems with new medications (rash/itching, nausea, etc)  3. Fever over 101.5 F (38.5 C) 4. Inability to urinate 5. Nausea and/or vomiting 6. Worsening swelling or bruising 7. Continued bleeding from incision. 8. Increased pain, redness, or drainage from the incision   The clinic staff is available to answer your  questions during regular business hours (8:30am-5pm).  Please dont hesitate to call and ask to speak to one of our nurses for clinical concerns.   If you have a medical emergency, go to the nearest emergency room or call 911.  A surgeon from [facility] is always on call at the [facility], [address], [place], [place], [place]  [address] ? MAIN: [phone] ? TOLL FREE: [phone] ?  FAX [phone] [email or web address]

## 2016-10-28 NOTE — Op Note (Signed)
Operative Note  Joanna Osborn  [id number]  [id number]  10/28/2016   Surgeon: Clovis [name]  Assistant: OR staff  Procedure performed: laparoscopic repair of incarcerated epigastric and umbilical hernias with mesh  Preop diagnosis: incarcerated epigastric hernia Post-op diagnosis/intraop findings: incarcerated falciform within a 1cm epigastric hernia, small umbilical hernia. 20week size uterus from known fibroids.   Specimens: none EBL: 88BV Complications: none  Description of procedure: After obtaining informed consent the patient was taken to the operating room and placed supine on operating room table wheregeneral endotracheal anesthesia was initiated, preoperative antibiotics were administered, SCDs applied, and a formal timeout was performed. The abdomen was prepped and draped in the usual sterile fashion. The peritoneal cavity was entered with a visiport technique in the left upper quadrant and insufflated to 6mmHg. Gross inspection revealed no evidence of injury from our entry. The epigastric hernia was noted and contained incarcerated falciform. There was a small umbilical hernia defect. The uterus was significantly enlarged to about 20 week size. No other abnormalities were noted. A 18mm and 34mm trocar were placed in the left hemiabdomen under direct visualization. Using blunt, sharp and cautery dissection the falciform was reduced from its incarceration in the epigastric hernia. The falciform was taken down from the abdominal wall for a distance of several centimeters to allow a landing zone for the mesh. The hernia sac was excised as able. Hemostasis was carefully ensured with cautery. The distance from the top of the epigastric hernia defect to the bottom of the umbilical defect was measured at about 4cm. Thus a 6" x 4" piece of ventralight mesh was selected. 2-0 PDS tacking sutures were placed at the four sides of the mesh and tied down. The rough side was marked and the mesh  was moistened and introduced via the 16mm trocar. It was unfurled and appropriate orientation with the rough side toward the abdominal wall was confirmed. Using a PMI suture passer the tacking sutures were brought out through the abdominal wall and tied down. The mesh sat nicely across th abdominal wall with no tension and had at least 5cm of overlap around each hernia defect. The SecureStrap tacker was then used to circumferentially secure the mesh to the abdominal wall with an outer ring and a few inner ring tacs as well. The abdominal cavity was again closely inspected and no bleeding or injuries were present. The mesh sat nicely. The 39mm trocar site was closed with a 0-vicryl in the fascia using a PMI suture passer under direct laparoscopic visualization. The abdomen was desufflated and all trocars removed. The skin incisions were closed with subcuticular monocryl and dermabond. The patient was then awakened, extubated and taken to PACU in stable condition.   All counts were correct at the completion of the case.

## 2016-10-29 ENCOUNTER — Encounter (HOSPITAL_COMMUNITY): Payer: Self-pay | Admitting: Surgery

## 2017-04-10 ENCOUNTER — Ambulatory Visit (HOSPITAL_COMMUNITY)
Admission: EM | Admit: 2017-04-10 | Discharge: 2017-04-10 | Disposition: A | Discharge status: Home / Self Care | Payer: Federal, State, Local not specified - PPO | Attending: Internal Medicine | Admitting: Internal Medicine

## 2017-04-10 ENCOUNTER — Encounter (HOSPITAL_COMMUNITY): Payer: Self-pay | Admitting: Emergency Medicine

## 2017-04-10 DIAGNOSIS — F332 Major depressive disorder, recurrent severe without psychotic features: Secondary | ICD-10-CM | POA: Diagnosis not present

## 2017-04-10 DIAGNOSIS — Z87442 Personal history of urinary calculi: Secondary | ICD-10-CM | POA: Insufficient documentation

## 2017-04-10 DIAGNOSIS — H9202 Otalgia, left ear: Secondary | ICD-10-CM | POA: Insufficient documentation

## 2017-04-10 DIAGNOSIS — J029 Acute pharyngitis, unspecified: Secondary | ICD-10-CM | POA: Insufficient documentation

## 2017-04-10 DIAGNOSIS — F419 Anxiety disorder, unspecified: Secondary | ICD-10-CM | POA: Insufficient documentation

## 2017-04-10 DIAGNOSIS — R51 Headache: Secondary | ICD-10-CM | POA: Diagnosis not present

## 2017-04-10 DIAGNOSIS — J069 Acute upper respiratory infection, unspecified: Secondary | ICD-10-CM

## 2017-04-10 LAB — POCT RAPID STREP A: Streptococcus, Group A Screen (Direct): NEGATIVE

## 2017-04-10 NOTE — Discharge Instructions (Addendum)
Rapid strep test was negative.  A throat culture is pending.  The urgent care will notify you if further treatment is needed.  Anticipate gradual improvement in symptoms over the next several days.  Recheck for further evaluation if symptoms are not improving.  LM

## 2017-04-10 NOTE — ED Triage Notes (Signed)
Pt reports left ear pain that radiates into her neck since last night.  She denies any other symptoms, other than a headache for the last few days that she thought might be sinus related.

## 2017-04-10 NOTE — ED Provider Notes (Signed)
[place]    CSN: [id number] Arrival date & time: 04/10/17  1801     History   Chief Complaint Chief Complaint  Patient presents with  . Otalgia    left    HPI Joanna Osborn is a 46 y.o. female. She presents today with a couple days history of mild headache, and the onset yesterday of left sided sore throat. This is worse today, radiates into her left ear. No fever. Not much runny/congested nose, no cough. No stomach upset.    HPI  Past Medical History:  Diagnosis Date  . Anxiety   . Depression   . History of kidney stones   . Kidney stone     Patient Active Problem List   Diagnosis Date Noted  . Suicide ideation 07/07/2013  . Major depressive disorder, recurrent severe without psychotic features ([place]) 07/06/2013    Past Surgical History:  Procedure Laterality Date  . CHOLECYSTECTOMY  2002  . INSERTION OF MESH N/A 10/28/2016   Procedure: INSERTION OF MESH;  Surgeon: Clovis [name], MD;  Location: [place];  Service: General;  Laterality: N/A;  . [place]  . VENTRAL HERNIA REPAIR N/A 10/28/2016   Procedure: LAPAROSCOPIC VENTRAL HERNIA REPAIR;  Surgeon: Clovis [name], MD;  Location: [place];  Service: General;  Laterality: N/A;  . WRIST SURGERY      OB History    Gravida Para Term Preterm AB Living   2 2 2     2    SAB TAB Ectopic Multiple Live Births                   Home Medications    Prior to Admission medications   Medication Sig Start Date End Date Taking? Authorizing Provider  levonorgestrel (MIRENA) 20 MCG/24HR IUD 1 each by Intrauterine route once.   Yes [provider]  ARIPiprazole (ABILIFY) 5 MG tablet Take 5 mg by mouth daily.    [provider]  Oxcarbazepine (TRILEPTAL) 300 MG tablet Take 300 mg by mouth at bedtime.  05/25/16   [provider]  oxyCODONE-acetaminophen (PERCOCET/ROXICET) 5-325 MG tablet Take 1 tablet by mouth every 6 (six) hours as needed for severe pain. 10/28/16   Clovis [name], MD    Family History Family History  Problem Relation Age of Onset  . Hypertension Other   . Cancer Other     Social History Social History  Substance Use Topics  . Smoking status: Never Smoker  . Smokeless tobacco: Never Used  . Alcohol use 0.6 oz/week    1 Glasses of wine per week     Comment: one glass wine twice monthly     Allergies   No known allergies   Review of Systems Review of Systems  All other systems reviewed and are negative.    Physical Exam Triage Vital Signs ED Triage Vitals [04/10/17 1824]  Enc Vitals Group     BP (!) 153/95     Pulse Rate 88     Resp 16     Temp 98.6 F (37 C)     Temp Source Oral     SpO2 100 %     Weight      Height      Pain Score 4     Pain Loc    Updated Vital Signs BP (!) 153/95 (BP Location: Left Arm) Comment: reported BP to nurse Meredith Munday  Pulse 88   Temp 98.6 F (37  C) (Oral)   Resp 16   LMP 04/02/2017 (Approximate)   SpO2 100%   Physical Exam  Constitutional: She is oriented to person, place, and time. No distress.  HENT:  Head: Atraumatic.  Bilateral TMs moderately dull, no erythema Moderate nasal congestion Mildly prominent tonsils bilaterally, with some exudate, minimal erythema  Eyes:  Conjugate gaze observed, no eye redness/discharge  Neck: Neck supple.  Cardiovascular: Normal rate.   Pulmonary/Chest: No respiratory distress.  Abdominal: She exhibits no distension.  Musculoskeletal: Normal range of motion.  Neurological: She is alert and oriented to person, place, and time.  Skin: Skin is warm and dry.  Nursing note and vitals reviewed.    UC Treatments / Results  Labs Results for orders placed or performed during the hospital encounter of 04/10/17  POCT rapid strep A [facility])  Result Value Ref Range   Streptococcus, Group A Screen (Direct) NEGATIVE NEGATIVE    Procedures Procedures (including critical care time) None today  Final Clinical  Impressions(s) / UC Diagnoses   Final diagnoses:  Acute upper respiratory infection   Rapid strep test was negative.  A throat culture is pending.  The urgent care will notify you if further treatment is needed.  Anticipate gradual improvement in symptoms over the next several days.  Recheck for further evaluation if symptoms are not improving.   Controlled Substance Prescriptions [place] Controlled Substance Registry consulted? no   Sherlene Shams, MD 04/11/17 1253

## 2017-04-13 LAB — CULTURE, GROUP A STREP (THRC)

## 2017-04-26 DIAGNOSIS — N760 Acute vaginitis: Secondary | ICD-10-CM | POA: Diagnosis not present

## 2017-04-26 DIAGNOSIS — Z113 Encounter for screening for infections with a predominantly sexual mode of transmission: Secondary | ICD-10-CM | POA: Diagnosis not present

## 2017-05-24 DIAGNOSIS — F329 Major depressive disorder, single episode, unspecified: Secondary | ICD-10-CM | POA: Diagnosis not present

## 2017-05-25 DIAGNOSIS — F329 Major depressive disorder, single episode, unspecified: Secondary | ICD-10-CM | POA: Diagnosis not present

## 2017-06-07 DIAGNOSIS — F329 Major depressive disorder, single episode, unspecified: Secondary | ICD-10-CM | POA: Diagnosis not present

## 2017-06-08 DIAGNOSIS — F329 Major depressive disorder, single episode, unspecified: Secondary | ICD-10-CM | POA: Diagnosis not present

## 2017-06-14 DIAGNOSIS — F329 Major depressive disorder, single episode, unspecified: Secondary | ICD-10-CM | POA: Diagnosis not present

## 2017-06-15 DIAGNOSIS — F329 Major depressive disorder, single episode, unspecified: Secondary | ICD-10-CM | POA: Diagnosis not present

## 2017-08-03 DIAGNOSIS — Z6836 Body mass index (BMI) 36.0-36.9, adult: Secondary | ICD-10-CM | POA: Diagnosis not present

## 2017-08-03 DIAGNOSIS — Z1231 Encounter for screening mammogram for malignant neoplasm of breast: Secondary | ICD-10-CM | POA: Diagnosis not present

## 2017-08-03 DIAGNOSIS — Z01419 Encounter for gynecological examination (general) (routine) without abnormal findings: Secondary | ICD-10-CM | POA: Diagnosis not present

## 2017-08-09 ENCOUNTER — Encounter (HOSPITAL_COMMUNITY): Payer: Self-pay | Admitting: Emergency Medicine

## 2017-08-09 ENCOUNTER — Ambulatory Visit (HOSPITAL_COMMUNITY)
Admission: EM | Admit: 2017-08-09 | Discharge: 2017-08-09 | Disposition: A | Discharge status: Home / Self Care | Attending: Internal Medicine | Admitting: Internal Medicine

## 2017-08-09 DIAGNOSIS — J320 Chronic maxillary sinusitis: Secondary | ICD-10-CM | POA: Diagnosis not present

## 2017-08-09 MED ORDER — AMOXICILLIN 500 MG PO CAPS: 500.0000 mg | ORAL_CAPSULE | Freq: Three times a day (TID) | ORAL | 0 refills | Status: DC

## 2017-08-09 MED ORDER — CETIRIZINE-PSEUDOEPHEDRINE ER 5-120 MG PO TB12: 1.0000 | ORAL_TABLET | Freq: Every day | ORAL | 0 refills | Status: DC

## 2017-08-09 MED ORDER — MOMETASONE FUROATE 50 MCG/ACT NA SUSP: 2.0000 | Freq: Every day | NASAL | 12 refills | Status: DC

## 2017-08-09 NOTE — ED Provider Notes (Signed)
[place]    CSN: [id number] Arrival date & time: 08/09/17  1112     History   Chief Complaint Chief Complaint  Patient presents with  . Facial Pain    HPI Joanna Osborn is a 47 y.o. female.   47 yo female with depression c/o facial pain and yellow rhinorrhea + headache x2 days. She has felt bad for more days. She denies fever or N/V/D. Also denies eye pain or visual disturbance.       Past Medical History:  Diagnosis Date  . Anxiety   . Depression   . History of kidney stones   . Kidney stone     Patient Active Problem List   Diagnosis Date Noted  . Suicide ideation 07/07/2013  . Major depressive disorder, recurrent severe without psychotic features ([place]) 07/06/2013    Past Surgical History:  Procedure Laterality Date  . CHOLECYSTECTOMY  2002  . INSERTION OF MESH N/A 10/28/2016   Procedure: INSERTION OF MESH;  Surgeon: Clovis [name], MD;  Location: [place];  Service: General;  Laterality: N/A;  . [place]  . VENTRAL HERNIA REPAIR N/A 10/28/2016   Procedure: LAPAROSCOPIC VENTRAL HERNIA REPAIR;  Surgeon: Clovis [name], MD;  Location: [place];  Service: General;  Laterality: N/A;  . WRIST SURGERY      OB History    Gravida Para Term Preterm AB Living   2 2 2     2    SAB TAB Ectopic Multiple Live Births                   Home Medications    Prior to Admission medications   Medication Sig Start Date End Date Taking? Authorizing Provider  levonorgestrel (MIRENA) 20 MCG/24HR IUD 1 each by Intrauterine route once.   Yes [provider]  amoxicillin (AMOXIL) 500 MG capsule Take 1 capsule (500 mg total) by mouth 3 (three) times daily. 08/09/17   Harrie Foreman, MD  ARIPiprazole (ABILIFY) 5 MG tablet Take 5 mg by mouth daily.    [provider]  cetirizine-pseudoephedrine (ZYRTEC-D) 5-120 MG tablet Take 1 tablet by mouth daily. 08/09/17   Harrie Foreman, MD  mometasone (NASONEX) 50 MCG/ACT nasal spray Place 2  sprays into the nose daily. 08/09/17   Harrie Foreman, MD  Oxcarbazepine (TRILEPTAL) 300 MG tablet Take 300 mg by mouth at bedtime.  05/25/16   [provider]  oxyCODONE-acetaminophen (PERCOCET/ROXICET) 5-325 MG tablet Take 1 tablet by mouth every 6 (six) hours as needed for severe pain. 10/28/16   Clovis [name], MD    Family History Family History  Problem Relation Age of Onset  . Hypertension Other   . Cancer Other     Social History Social History   Tobacco Use  . Smoking status: Never Smoker  . Smokeless tobacco: Never Used  Substance Use Topics  . Alcohol use: Yes    Alcohol/week: 0.6 oz    Types: 1 Glasses of wine per week    Comment: one glass wine twice monthly  . Drug use: No    Comment: never used drugs     Allergies   No known allergies   Review of Systems Review of Systems  Constitutional: Negative for chills and fever.  HENT: Positive for rhinorrhea, sinus pressure and sore throat. Negative for tinnitus.   Eyes: Negative for redness.  Respiratory: Negative for cough and shortness of breath.   Cardiovascular: Negative for chest pain  and palpitations.  Gastrointestinal: Negative for abdominal pain, diarrhea, nausea and vomiting.  Genitourinary: Negative for dysuria, frequency and urgency.  Musculoskeletal: Negative for myalgias.  Skin: Negative for rash.       No lesions  Neurological: Negative for weakness.  Hematological: Does not bruise/bleed easily.  Psychiatric/Behavioral: Negative for suicidal ideas.     Physical Exam Triage Vital Signs ED Triage Vitals  Enc Vitals Group     BP 08/09/17 1153 (!) 161/98     Pulse Rate 08/09/17 1153 98     Resp 08/09/17 1153 20     Temp 08/09/17 1153 99 F (37.2 C)     Temp Source 08/09/17 1153 Oral     SpO2 08/09/17 1153 100 %     Weight 08/09/17 1203 218 lb (98.9 kg)     Height 08/09/17 1203 5\' 5"  (1.651 m)     Head Circumference --      Peak Flow --      Pain Score 08/09/17 1203 4      Pain Loc --      Pain Edu? --      Excl. in [place]? --    No data found.  Updated Vital Signs BP (!) 161/98 (BP Location: Right Arm) Comment: Notified RN  Pulse 98   Temp 99 F (37.2 C) (Oral)   Resp 20   Ht 5\' 5"  (1.651 m)   Wt 218 lb (98.9 kg)   SpO2 100%   BMI 36.28 kg/m   Visual Acuity Right Eye Distance:   Left Eye Distance:   Bilateral Distance:    Right Eye Near:   Left Eye Near:    Bilateral Near:     Physical Exam   UC Treatments / Results  Labs (all labs ordered are listed, but only abnormal results are displayed) Labs Reviewed - No data to display  EKG  EKG Interpretation None       Radiology No results found.  Procedures Procedures (including critical care time)  Medications Ordered in UC Medications - No data to display   Initial Impression / Assessment and Plan / UC Course  I have reviewed the triage vital signs and the nursing notes.  Pertinent labs & imaging results that were available during my care of the patient were reviewed by me and considered in my medical decision making (see chart for details).     Developing sinus infection. Pt has felt like this before. Will Rx anticipatory abx  Final Clinical Impressions(s) / UC Diagnoses   Final diagnoses:  Chronic maxillary sinusitis    ED Discharge Orders        Ordered    cetirizine-pseudoephedrine (ZYRTEC-D) 5-120 MG tablet  Daily     08/09/17 1224    amoxicillin (AMOXIL) 500 MG capsule  3 times daily     08/09/17 1224    mometasone (NASONEX) 50 MCG/ACT nasal spray  Daily     08/09/17 1224       Controlled Substance Prescriptions [place] Controlled Substance Registry consulted? Not Applicable   Harrie Foreman, MD 08/09/17 1234

## 2017-08-09 NOTE — ED Triage Notes (Signed)
PT reports facial pain and sinus drainage for 2 days. Ear fullness as well.

## 2017-08-20 DIAGNOSIS — D251 Intramural leiomyoma of uterus: Secondary | ICD-10-CM | POA: Diagnosis not present

## 2018-01-24 ENCOUNTER — Encounter (HOSPITAL_COMMUNITY): Payer: Self-pay | Admitting: Emergency Medicine

## 2018-01-24 ENCOUNTER — Other Ambulatory Visit: Payer: Self-pay

## 2018-01-24 ENCOUNTER — Ambulatory Visit (HOSPITAL_COMMUNITY)
Admission: EM | Admit: 2018-01-24 | Discharge: 2018-01-24 | Disposition: A | Discharge status: Home / Self Care | Payer: Federal, State, Local not specified - PPO | Attending: Family Medicine | Admitting: Family Medicine

## 2018-01-24 DIAGNOSIS — M5441 Lumbago with sciatica, right side: Secondary | ICD-10-CM | POA: Diagnosis not present

## 2018-01-24 MED ORDER — PREDNISONE 10 MG (21) PO TBPK: ORAL_TABLET | Freq: Every day | ORAL | 0 refills | Status: DC

## 2018-01-24 MED ORDER — NAPROXEN 500 MG PO TABS: 500.0000 mg | ORAL_TABLET | Freq: Two times a day (BID) | ORAL | 0 refills | Status: DC

## 2018-01-24 MED ORDER — CYCLOBENZAPRINE HCL 5 MG PO TABS: 5.0000 mg | ORAL_TABLET | Freq: Every day | ORAL | 0 refills | Status: DC

## 2018-01-24 NOTE — Discharge Instructions (Signed)
Light and regular activity as tolerated.  Steroid dose pack. Muscle relaxer at night as needed, may cause drowsiness so do not take if drinking alcohol or driving. Naproxen twice a day once steroids have been completed. Don't take additional ibuprofen.  Exercises as provided as tolerated.  If symptoms worsen or do not improve in the next 2-3 weeks to return to be seen or to follow up with your PCP. Marland Kitchen

## 2018-01-24 NOTE — ED Provider Notes (Signed)
[place]    CSN: [id number] Arrival date & time: 01/24/18  1003     History   Chief Complaint Chief Complaint  Patient presents with  . Back Pain    HPI Joanna Osborn is a 47 y.o. female.   Joanna presents with complaints of bilateral low back pain, now right is worse than left side. Started 8 days ago after getting out of her vehicle. No other specific injury. 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 Pain seems worse. Radiates down thighs bilaterally at times. Pain with sitting, laying, bending, standing. No numbness, tingling or weakness. No urinary symptoms, no urinary or stool incontinence. Has taking ibuprofen and goodies which has not helped. Pain 8/10. Has not taken any medications today. Denies  Any previous back injury. Hx anxiety, depression, kidney stones.    ROS per HPI.      Past Medical History:  Diagnosis Date  . Anxiety   . Depression   . History of kidney stones   . Kidney stone     Patient Active Problem List   Diagnosis Date Noted  . Suicide ideation 07/07/2013  . Major depressive disorder, recurrent severe without psychotic features ([place]) 07/06/2013    Past Surgical History:  Procedure Laterality Date  . CHOLECYSTECTOMY  2002  . INSERTION OF MESH N/A 10/28/2016   Procedure: INSERTION OF MESH;  Surgeon: Clovis [name], MD;  Location: [employer];  Service: General;  Laterality: N/A;  . [place]  . VENTRAL HERNIA REPAIR N/A 10/28/2016   Procedure: LAPAROSCOPIC VENTRAL HERNIA REPAIR;  Surgeon: Clovis [name], MD;  Location: [facility];  Service: General;  Laterality: N/A;  . WRIST SURGERY      OB History    Gravida  2   Para  2   Term  2   Preterm      AB      Living  2     SAB      TAB      Ectopic      Multiple      Live Births               Home Medications    Prior to Admission medications   Medication Sig Start Date End Date Taking? Authorizing Provider  amoxicillin (AMOXIL) 500 MG  capsule Take 1 capsule (500 mg total) by mouth 3 (three) times daily. 08/09/17   Harrie Foreman, MD  ARIPiprazole (ABILIFY) 5 MG tablet Take 5 mg by mouth daily.    [provider]  cetirizine-pseudoephedrine (ZYRTEC-D) 5-120 MG tablet Take 1 tablet by mouth daily. 08/09/17   Harrie Foreman, MD  cyclobenzaprine (FLEXERIL) 5 MG tablet Take 1 tablet (5 mg total) by mouth at bedtime. 01/24/18   Zigmund Gottron, NP  levonorgestrel (MIRENA) 20 MCG/24HR IUD 1 each by Intrauterine route once.    [provider]  mometasone (NASONEX) 50 MCG/ACT nasal spray Place 2 sprays into the nose daily. 08/09/17   Harrie Foreman, MD  naproxen (NAPROSYN) 500 MG tablet Take 1 tablet (500 mg total) by mouth 2 (two) times daily. 01/29/18   Zigmund Gottron, NP  Oxcarbazepine (TRILEPTAL) 300 MG tablet Take 300 mg by mouth at bedtime.  05/25/16   [provider]  oxyCODONE-acetaminophen (PERCOCET/ROXICET) 5-325 MG tablet Take 1 tablet by mouth every 6 (six) hours as needed for severe pain. 10/28/16   Clovis [name], MD  predniSONE [facility]  UNI-PAK 21 TAB) 10 MG (21) TBPK tablet Take by mouth daily. Per box instruct 01/24/18   Zigmund Gottron, NP  cetirizine (ZYRTEC) 10 MG tablet Take 1 tablet (10 mg total) by mouth daily. 01/04/14 11/08/14  Presson, Audelia Hives, PA  traZODone (DESYREL) 50 MG tablet Take 50 mg by mouth at bedtime as needed for sleep.  11/08/14  [provider]    Family History Family History  Problem Relation Age of Onset  . Hypertension Other   . Cancer Other     Social History Social History   Tobacco Use  . Smoking status: Never Smoker  . Smokeless tobacco: Never Used  Substance Use Topics  . Alcohol use: Yes    Alcohol/week: 0.6 oz    Types: 1 Glasses of wine per week    Comment: one glass wine twice monthly  . Drug use: No    Comment: never used drugs     Allergies   No known allergies   Review of Systems Review of  Systems   Physical Exam Triage Vital Signs ED Triage Vitals  Enc Vitals Group     BP 01/24/18 1034 (!) 150/109     Pulse Rate 01/24/18 1034 99     Resp --      Temp 01/24/18 1034 98.4 F (36.9 C)     Temp Source 01/24/18 1034 Oral     SpO2 01/24/18 1034 99 %     Weight --      Height --      Head Circumference --      Peak Flow --      Pain Score 01/24/18 1032 8     Pain Loc --      Pain Edu? --      Excl. in [place]? --    No data found.  Updated Vital Signs BP (!) 150/109 (BP Location: Left Arm)   Pulse 99   Temp 98.4 F (36.9 C) (Oral)   SpO2 99%   Visual Acuity Right Eye Distance:   Left Eye Distance:   Bilateral Distance:    Right Eye Near:   Left Eye Near:    Bilateral Near:     Physical Exam  Constitutional: She is oriented to person, place, and time. She appears well-developed and well-nourished. No distress.  Cardiovascular: Normal rate, regular rhythm and normal heart sounds.  Pulmonary/Chest: Effort normal and breath sounds normal.  Abdominal: There is no tenderness. There is no CVA tenderness.  Musculoskeletal:       Lumbar back: She exhibits decreased range of motion, tenderness, bony tenderness and pain. She exhibits no swelling, no edema, no laceration and normal pulse.       Back:  Central and right sided low back pain on palpation; pain with right hip flexion and pain with bilateral straight leg raise; without radiation with straight leg raise; pain with ambulation as well as with both heel and toe touch weight bearing; strength equal bilaterally and sensation intact; pain with transition from sitting to laying and from sitting to standing  Neurological: She is alert and oriented to person, place, and time.  Skin: Skin is warm and dry.     UC Treatments / Results  Labs (all labs ordered are listed, but only abnormal results are displayed) Labs Reviewed - No data to display  EKG None  Radiology No results found.  Procedures Procedures  (including critical care time)  Medications Ordered in UC Medications - No data to display  Initial Impression / Assessment and Plan / UC Course  I have reviewed the triage vital signs and the nursing notes.  Pertinent labs & imaging results that were available during my care of the patient were reviewed by me and considered in my medical decision making (see chart for details).     History and physical consistent with low back strain. No red flag findings. Steroid dose pack and flexeril followed by naproxen bid. Exercises provided. Return precautions provided. Patient verbalized understanding and agreeable to plan.  Ambulatory out of clinic without difficulty.    Final Clinical Impressions(s) / UC Diagnoses   Final diagnoses:  Acute bilateral low back pain with right-sided sciatica     Discharge Instructions     Light and regular activity as tolerated.  Steroid dose pack. Muscle relaxer at night as needed, may cause drowsiness so do not take if drinking alcohol or driving. Naproxen twice a day once steroids have been completed. Don't take additional ibuprofen.  Exercises as provided as tolerated.  If symptoms worsen or do not improve in the next 2-3 weeks to return to be seen or to follow up with your PCP. Marland Kitchen    ED Prescriptions    Medication Sig Dispense Auth. Provider   predniSONE (STERAPRED UNI-PAK 21 TAB) 10 MG (21) TBPK tablet Take by mouth daily. Per box instruct 21 tablet Burky, Natalie B, NP   cyclobenzaprine (FLEXERIL) 5 MG tablet Take 1 tablet (5 mg total) by mouth at bedtime. 15 tablet Augusto Gamble B, NP   naproxen (NAPROSYN) 500 MG tablet Take 1 tablet (500 mg total) by mouth 2 (two) times daily. 30 tablet Zigmund Gottron, NP     Controlled Substance Prescriptions [place] Controlled Substance Registry consulted? Not Applicable   Zigmund Gottron, NP 01/24/18 1108

## 2018-01-24 NOTE — ED Triage Notes (Signed)
Pt reports getting out of her car eight days ago and feeling pain in her lower back.  She has had continued pain since then, trying ibuprofen, heat, and icy hot with no relief.

## 2018-02-08 DIAGNOSIS — M545 Low back pain: Secondary | ICD-10-CM | POA: Diagnosis not present

## 2018-02-08 DIAGNOSIS — M5136 Other intervertebral disc degeneration, lumbar region: Secondary | ICD-10-CM | POA: Diagnosis not present

## 2018-02-15 DIAGNOSIS — M5136 Other intervertebral disc degeneration, lumbar region: Secondary | ICD-10-CM | POA: Diagnosis not present

## 2018-02-25 DIAGNOSIS — M545 Low back pain: Secondary | ICD-10-CM | POA: Diagnosis not present

## 2018-02-28 DIAGNOSIS — M545 Low back pain: Secondary | ICD-10-CM | POA: Diagnosis not present

## 2018-03-01 DIAGNOSIS — M545 Low back pain: Secondary | ICD-10-CM | POA: Diagnosis not present

## 2018-03-07 DIAGNOSIS — M5136 Other intervertebral disc degeneration, lumbar region: Secondary | ICD-10-CM | POA: Diagnosis not present

## 2018-03-14 DIAGNOSIS — M5136 Other intervertebral disc degeneration, lumbar region: Secondary | ICD-10-CM | POA: Diagnosis not present

## 2018-03-30 DIAGNOSIS — Z113 Encounter for screening for infections with a predominantly sexual mode of transmission: Secondary | ICD-10-CM | POA: Diagnosis not present

## 2018-03-30 DIAGNOSIS — N760 Acute vaginitis: Secondary | ICD-10-CM | POA: Diagnosis not present

## 2018-04-26 DIAGNOSIS — Z30433 Encounter for removal and reinsertion of intrauterine contraceptive device: Secondary | ICD-10-CM | POA: Diagnosis not present

## 2018-04-29 ENCOUNTER — Encounter (HOSPITAL_COMMUNITY): Payer: Self-pay | Admitting: Nurse Practitioner

## 2018-04-29 DIAGNOSIS — M79604 Pain in right leg: Secondary | ICD-10-CM | POA: Insufficient documentation

## 2018-04-29 DIAGNOSIS — Z79899 Other long term (current) drug therapy: Secondary | ICD-10-CM | POA: Insufficient documentation

## 2018-04-29 DIAGNOSIS — I10 Essential (primary) hypertension: Secondary | ICD-10-CM | POA: Diagnosis not present

## 2018-04-29 LAB — BASIC METABOLIC PANEL
Anion gap: 11 (ref 5–15)
BUN: 13 mg/dL (ref 6–20)
CHLORIDE: 109 mmol/L (ref 98–111)
CO2: 22 mmol/L (ref 22–32)
CREATININE: 0.6 mg/dL (ref 0.44–1.00)
Calcium: 9.8 mg/dL (ref 8.9–10.3)
GFR calc non Af Amer: >60 mL/min (ref >60)
Glucose, Bld: 99 mg/dL (ref 70–99)
Potassium: 3.9 mmol/L (ref 3.5–5.1)
SODIUM: 142 mmol/L (ref 135–145)

## 2018-04-29 LAB — CBC
HCT: 41.1 % (ref 36.0–46.0)
Hemoglobin: 13.1 g/dL (ref 12.0–15.0)
MCH: 29 pg (ref 26.0–34.0)
MCHC: 31.9 g/dL (ref 30.0–36.0)
MCV: 90.9 fL (ref 78.0–100.0)
PLATELETS: 456 10*3/uL — AB (ref 150–400)
RBC: 4.52 MIL/uL (ref 3.87–5.11)
RDW: 13.5 % (ref 11.5–15.5)
WBC: 7.9 10*3/uL (ref 4.0–10.5)

## 2018-04-29 LAB — I-STAT BETA HCG BLOOD, ED (MC, WL, AP ONLY): I-stat hCG, quantitative: <5 m[IU]/mL (ref <5)

## 2018-04-29 NOTE — ED Triage Notes (Signed)
Pt is c/o a constant right knee pain, especially behind the knee. She concerned of a blood owingto family history mother and grandmother, of note she also has mild bilateral ankle swelling.

## 2018-04-30 ENCOUNTER — Ambulatory Visit (HOSPITAL_COMMUNITY): Admission: RE | Admit: 2018-04-30 | Source: Ambulatory Visit

## 2018-04-30 ENCOUNTER — Emergency Department (HOSPITAL_COMMUNITY)
Admission: EM | Admit: 2018-04-30 | Discharge: 2018-04-30 | Disposition: A | Discharge status: Home / Self Care | Payer: Federal, State, Local not specified - PPO | Attending: Emergency Medicine | Admitting: Emergency Medicine

## 2018-04-30 DIAGNOSIS — M79604 Pain in right leg: Secondary | ICD-10-CM | POA: Diagnosis not present

## 2018-04-30 DIAGNOSIS — I10 Essential (primary) hypertension: Secondary | ICD-10-CM

## 2018-04-30 MED ORDER — HYDROCHLOROTHIAZIDE 12.5 MG PO TABS: 12.5000 mg | ORAL_TABLET | Freq: Every day | ORAL | 1 refills | Status: DC

## 2018-04-30 MED ORDER — ENOXAPARIN SODIUM 150 MG/ML ~~LOC~~ SOLN
150.0000 mg | Freq: Once | SUBCUTANEOUS | Status: AC
  Administered 2018-04-30: 150 mg via SUBCUTANEOUS
  Filled 2018-04-30: qty 1

## 2018-04-30 NOTE — Discharge Instructions (Addendum)
You have been given a dose of Lovenox in the emergency department.  This is a blood thinner.  It will last for approximately 24 hours before metabolizing from your system.  A blood thinner may cause you to bleed easier and make it harder for bleeding to stop.  Should you fall or hit your head you would benefit from prompt evaluation in the ED.  You have been scheduled for an ultrasound of your right leg.  This should occur at 8:30 AM tomorrow, 04/30/2018.  If your ultrasound is negative for blood clot, you do not need to continue blood thinners and should follow-up with a primary care doctor.  Your blood pressure was found to be elevated while in the ED today.  You have been started on hydrochlorothiazide for this reason.  Be sure to remain well-hydrated by drinking plenty of fluids.  We also recommend that you try and decrease your salt intake as well as your intake of processed foods.  You would benefit from increased cardiovascular activity such as walking.  Have your blood pressure rechecked by a primary care doctor within the week.  You may return for new or concerning symptoms.

## 2018-04-30 NOTE — ED Provider Notes (Signed)
[place] DEPT Provider Note   CSN: [id number] Arrival date & time: 04/29/18  1928    History   Chief Complaint Chief Complaint  Patient presents with  . Leg Pain  . Leg Swelling    HPI Joanna Osborn is a 47 y.o. female.  47 year old female with a history of anxiety and depression presents to the emergency department for evaluation of right lower extremity pain.  States that she has noticed pain behind her right knee, more laterally.  This has extended to her right calf.  Denies any worsening pain with ambulation, but has noted some associated swelling in her bilateral ankles.  Symptoms began 2 days ago and have remained constant.  She does have a history of low back pain with right-sided sciatica, but feels this pain is different.  She denies taking any medications for her symptoms.  Denies any recent trauma or injury.  No associated fevers, chest pain, shortness of breath, hemoptysis, lightheadedness, syncope or near syncope.  Has an IUD in place.  No prior history of DVT/PE.  Was noted to be hypertensive in triage.  Denies history of high blood pressure.     Past Medical History:  Diagnosis Date  . Anxiety   . Depression   . History of kidney stones   . Kidney stone     Patient Active Problem List   Diagnosis Date Noted  . Suicide ideation 07/07/2013  . Major depressive disorder, recurrent severe without psychotic features ([place]) 07/06/2013    Past Surgical History:  Procedure Laterality Date  . CHOLECYSTECTOMY  2002  . INSERTION OF MESH N/A 10/28/2016   Procedure: INSERTION OF MESH;  Surgeon: Clovis [name], MD;  Location: [facility];  Service: General;  Laterality: N/A;  . [place]  . VENTRAL HERNIA REPAIR N/A 10/28/2016   Procedure: LAPAROSCOPIC VENTRAL HERNIA REPAIR;  Surgeon: Clovis [name], MD;  Location: [facility];  Service: General;  Laterality: N/A;  . WRIST SURGERY       OB History    Gravida  2   Para  2   Term  2     Preterm      AB      Living  2     SAB      TAB      Ectopic      Multiple      Live Births               Home Medications    Prior to Admission medications   Medication Sig Start Date End Date Taking? Authorizing Provider  amoxicillin (AMOXIL) 500 MG capsule Take 1 capsule (500 mg total) by mouth 3 (three) times daily. 08/09/17   Harrie Foreman, MD  ARIPiprazole (ABILIFY) 5 MG tablet Take 5 mg by mouth daily.    [provider]  cetirizine-pseudoephedrine (ZYRTEC-D) 5-120 MG tablet Take 1 tablet by mouth daily. 08/09/17   Harrie Foreman, MD  cyclobenzaprine (FLEXERIL) 5 MG tablet Take 1 tablet (5 mg total) by mouth at bedtime. 01/24/18   Zigmund Gottron, NP  hydrochlorothiazide (HYDRODIURIL) 12.5 MG tablet Take 1 tablet (12.5 mg total) by mouth daily. 04/30/18   Antonietta Breach, PA-C  levonorgestrel (MIRENA) 20 MCG/24HR IUD 1 each by Intrauterine route once.    [provider]  mometasone (NASONEX) 50 MCG/ACT nasal spray Place 2 sprays into the nose daily. 08/09/17   Harrie Foreman, MD  naproxen (NAPROSYN) 500 MG tablet  Take 1 tablet (500 mg total) by mouth 2 (two) times daily. 01/29/18   Zigmund Gottron, NP  Oxcarbazepine (TRILEPTAL) 300 MG tablet Take 300 mg by mouth at bedtime.  05/25/16   [provider]  oxyCODONE-acetaminophen (PERCOCET/ROXICET) 5-325 MG tablet Take 1 tablet by mouth every 6 (six) hours as needed for severe pain. 10/28/16   Clovis [name], MD  predniSONE (STERAPRED UNI-PAK 21 TAB) 10 MG (21) TBPK tablet Take by mouth daily. Per box instruct 01/24/18   Zigmund Gottron, NP    Family History Family History  Problem Relation Age of Onset  . Hypertension Other   . Cancer Other     Social History Social History   Tobacco Use  . Smoking status: Never Smoker  . Smokeless tobacco: Never Used  Substance Use Topics  . Alcohol use: Yes    Alcohol/week: 1.0 standard drinks    Types: 1 Glasses of wine per week     Comment: one glass wine twice monthly  . Drug use: No    Comment: never used drugs     Allergies   No known allergies   Review of Systems Review of Systems Ten systems reviewed and are negative for acute change, except as noted in the HPI.    Physical Exam Updated Vital Signs BP (!) 142/92   Pulse 82   Temp 99.4 F (37.4 C) (Oral)   Resp 18   Ht 5\' 5"  (1.651 m)   Wt 102.5 kg   SpO2 97%   BMI 37.61 kg/m   Physical Exam  Constitutional: She is oriented to person, place, and time. She appears well-developed and well-nourished. No distress.  Obese, nontoxic.  HENT:  Head: Normocephalic and atraumatic.  Eyes: Conjunctivae and EOM are normal. No scleral icterus.  Neck: Normal range of motion.  No meningismus  Cardiovascular: Normal rate, regular rhythm and intact distal pulses.  Pulmonary/Chest: Effort normal. No stridor. No respiratory distress. She has no wheezes.  Respirations even and unlabored. Lungs CTAB.  Musculoskeletal: Normal range of motion.  Normal ROM of RLE. No calf tenderness or palpable cords. There is mild tenderness to the posterior lateral right knee without effusion or crepitus; no deformity. Normal ROM of the R knee. Minimal nonpitting edema in bilateral ankles.  Neurological: She is alert and oriented to person, place, and time. She exhibits normal muscle tone. Coordination normal.  GCS 15. Patient moving extremities vigorously.  Skin: Skin is warm and dry. No rash noted. She is not diaphoretic. No erythema. No pallor.  Psychiatric: She has a normal mood and affect. Her behavior is normal.  Nursing note and vitals reviewed.    ED Treatments / Results  Labs (all labs ordered are listed, but only abnormal results are displayed) Labs Reviewed  CBC - Abnormal; Notable for the following components:      Result Value   Platelets 456 (*)    All other components within normal limits  BASIC METABOLIC PANEL  I-STAT BETA HCG BLOOD, ED ([facility], [facility], [facility] ONLY)      EKG None  Radiology No results found.  Procedures Procedures (including critical care time)  Medications Ordered in ED Medications  enoxaparin (LOVENOX) injection 150 mg (150 mg Subcutaneous Given 04/30/18 0109)     Initial Impression / Assessment and Plan / ED Course  I have reviewed the triage vital signs and the nursing notes.  Pertinent labs & imaging results that were available during my care of the patient were reviewed by  me and considered in my medical decision making (see chart for details).     47 year old female presents to the emergency department for evaluation of right lower extremity pain.  Discomfort is mostly to her lateral posterior knee extending to her calf.  The patient is neurovascularly intact on exam.  She has minimal nonpitting edema in her bilateral ankles.  No palpable cords or reproducible calf tenderness.  No erythema, swelling, heat to touch to the right knee.  Patient expresses concern for DVT.  She has no symptoms to suggest PE.  Vital signs reassuring.  Plan to treat with a dose of Lovenox in the emergency department and have the patient return in the morning for a lower extremity ultrasound.    She was noted to be hypertensive in triage which has persisted while in the ED.  Denies any prior diagnosis of hypertension.  The patient is not followed by a primary care doctor. Will start on HCTZ and have the patient follow up with a PCP for recheck of her BP within 1 week.  Return precautions discussed and provided. Patient discharged in stable condition with no unaddressed concerns.   Final Clinical Impressions(s) / ED Diagnoses   Final diagnoses:  Right leg pain  Hypertension not at goal    ED Discharge Orders         Ordered    LE VENOUS     04/30/18 0059    hydrochlorothiazide (HYDRODIURIL) 12.5 MG tablet  Daily     04/30/18 0059           Antonietta Breach, PA-C 47/09/62 [id number]    Delora Fuel, MD 29/47/65 [phone]

## 2018-04-30 NOTE — ED Notes (Signed)
Verbalized understanding discharge and vascular study instructions. In no acute distress.

## 2018-05-01 ENCOUNTER — Ambulatory Visit (HOSPITAL_COMMUNITY)
Admission: RE | Admit: 2018-05-01 | Discharge: 2018-05-01 | Disposition: A | Discharge status: Home / Self Care | Payer: Federal, State, Local not specified - PPO | Source: Ambulatory Visit | Attending: Emergency Medicine | Admitting: Emergency Medicine

## 2018-05-01 DIAGNOSIS — M7989 Other specified soft tissue disorders: Secondary | ICD-10-CM | POA: Insufficient documentation

## 2018-05-01 DIAGNOSIS — R52 Pain, unspecified: Secondary | ICD-10-CM | POA: Diagnosis not present

## 2018-05-01 NOTE — Progress Notes (Signed)
,  VASCULAR LAB PRELIMINARY  PRELIMINARY  PRELIMINARY  PRELIMINARY  Right lower extremity venous duplex completed.    Preliminary report:  There is no DVT, SVT, or Baker's cyst noted in the right lower extremity.  S[place]row [place], RVT 05/01/2018, 8:48 AM

## 2018-06-01 DIAGNOSIS — Z30432 Encounter for removal of intrauterine contraceptive device: Secondary | ICD-10-CM | POA: Diagnosis not present

## 2018-07-08 DIAGNOSIS — M545 Low back pain: Secondary | ICD-10-CM | POA: Diagnosis not present

## 2018-07-11 DIAGNOSIS — N760 Acute vaginitis: Secondary | ICD-10-CM | POA: Diagnosis not present

## 2018-07-11 DIAGNOSIS — Z113 Encounter for screening for infections with a predominantly sexual mode of transmission: Secondary | ICD-10-CM | POA: Diagnosis not present

## 2018-07-11 DIAGNOSIS — N920 Excessive and frequent menstruation with regular cycle: Secondary | ICD-10-CM | POA: Diagnosis not present

## 2018-08-09 DIAGNOSIS — M5106 Intervertebral disc disorders with myelopathy, lumbar region: Secondary | ICD-10-CM | POA: Diagnosis not present

## 2018-08-09 DIAGNOSIS — R21 Rash and other nonspecific skin eruption: Secondary | ICD-10-CM | POA: Diagnosis not present

## 2018-08-09 DIAGNOSIS — N62 Hypertrophy of breast: Secondary | ICD-10-CM | POA: Diagnosis not present

## 2018-08-11 DIAGNOSIS — M5136 Other intervertebral disc degeneration, lumbar region: Secondary | ICD-10-CM | POA: Diagnosis not present

## 2018-08-22 DIAGNOSIS — M5136 Other intervertebral disc degeneration, lumbar region: Secondary | ICD-10-CM | POA: Diagnosis not present

## 2018-09-06 ENCOUNTER — Institutional Professional Consult (permissible substitution): Payer: Self-pay | Admitting: Plastic Surgery

## 2018-09-13 DIAGNOSIS — B354 Tinea corporis: Secondary | ICD-10-CM | POA: Diagnosis not present

## 2018-09-13 DIAGNOSIS — E669 Obesity, unspecified: Secondary | ICD-10-CM | POA: Diagnosis not present

## 2018-09-13 DIAGNOSIS — I10 Essential (primary) hypertension: Secondary | ICD-10-CM | POA: Diagnosis not present

## 2018-09-16 ENCOUNTER — Ambulatory Visit
Admission: EM | Admit: 2018-09-16 | Discharge: 2018-09-16 | Disposition: A | Discharge status: Home / Self Care | Payer: Federal, State, Local not specified - PPO | Attending: Family Medicine | Admitting: Family Medicine

## 2018-09-16 ENCOUNTER — Encounter: Payer: Self-pay | Admitting: Emergency Medicine

## 2018-09-16 DIAGNOSIS — M791 Myalgia, unspecified site: Secondary | ICD-10-CM

## 2018-09-16 DIAGNOSIS — J029 Acute pharyngitis, unspecified: Secondary | ICD-10-CM

## 2018-09-16 DIAGNOSIS — R51 Headache: Secondary | ICD-10-CM

## 2018-09-16 DIAGNOSIS — R05 Cough: Secondary | ICD-10-CM | POA: Diagnosis not present

## 2018-09-16 DIAGNOSIS — R0981 Nasal congestion: Secondary | ICD-10-CM | POA: Diagnosis not present

## 2018-09-16 DIAGNOSIS — R69 Illness, unspecified: Principal | ICD-10-CM

## 2018-09-16 DIAGNOSIS — R5383 Other fatigue: Secondary | ICD-10-CM

## 2018-09-16 DIAGNOSIS — R509 Fever, unspecified: Secondary | ICD-10-CM

## 2018-09-16 DIAGNOSIS — J3489 Other specified disorders of nose and nasal sinuses: Secondary | ICD-10-CM

## 2018-09-16 DIAGNOSIS — J111 Influenza due to unidentified influenza virus with other respiratory manifestations: Secondary | ICD-10-CM

## 2018-09-16 HISTORY — DX: Essential (primary) hypertension: I10

## 2018-09-16 MED ORDER — BENZONATATE 200 MG PO CAPS: 200.0000 mg | ORAL_CAPSULE | Freq: Three times a day (TID) | ORAL | 0 refills | Status: AC | PRN

## 2018-09-16 MED ORDER — ACETAMINOPHEN 325 MG PO TABS
650.0000 mg | ORAL_TABLET | Freq: Once | ORAL | Status: AC
Start: 2018-09-16 — End: 2018-09-16
  Administered 2018-09-16: 650 mg via ORAL

## 2018-09-16 MED ORDER — OSELTAMIVIR PHOSPHATE 75 MG PO CAPS: 75.0000 mg | ORAL_CAPSULE | Freq: Two times a day (BID) | ORAL | 0 refills | Status: AC

## 2018-09-16 MED ORDER — IBUPROFEN 800 MG PO TABS: 800.0000 mg | ORAL_TABLET | Freq: Three times a day (TID) | ORAL | 0 refills | Status: DC

## 2018-09-16 NOTE — ED Triage Notes (Signed)
Pt c/o cough, fever, headache, running nose and body aches since yesterday

## 2018-09-16 NOTE — Discharge Instructions (Signed)
Your symptoms are most likely from the flu, the flu is a virus that runs its course over approximately 1 week.  Main treatment is symptomatic control, recommendations below.  You may also start Tamiflu twice daily for the next 5 days, but this is not required for your body to fight this off.  1. Take a daily allergy pill/anti-histamine like Zyrtec, Claritin, or Store brand consistently for 2 weeks  2. For congestion you may try an oral decongestant like Mucinex. You may also try intranasal flonase nasal spray or saline irrigations (neti pot, sinus cleanse)  3. For your sore throat you may try cepacol lozenges, salt water gargles, throat spray. Treatment of congestion may also help your sore throat.  4. For cough you may try Tessalon provided or over-the-counter Delsym, Robitussen, Mucinex DM  5. Take Tylenol or Ibuprofen to help with pain/inflammation-alternate every 4 hours to keep fever down  6. Stay hydrated, drink plenty of fluids to keep throat coated and less irritated  Honey Tea For cough/sore throat try using a honey-based tea. Use 3 teaspoons of honey with juice squeezed from half lemon. Place shaved pieces of ginger into 1/2-1 cup of water and warm over stove top. Then mix the ingredients and repeat every 4 hours as needed.

## 2018-09-16 NOTE — ED Provider Notes (Signed)
[facility]    CSN: [id number] Arrival date & time: 09/16/18  1414     History   Chief Complaint Chief Complaint  Patient presents with  . Influenza    HPI CLYTIE SHETLEY is a 48 y.o. female history of anxiety and depression; Patient is presenting with URI symptoms- congestion, cough, sore throat.  Patient is also had fever, headaches, body aches.  Patient's main complaints are body aches and overall feeling poor. Symptoms have been going on for 1 to 2 days. Patient has tried Alka-Seltzer, with minimal relief. Denies nausea, vomiting, diarrhea. Denies shortness of breath and chest pain.  Denies recent travel, denies exposure to anybody with concern for coronavirus.   HPI  Past Medical History:  Diagnosis Date  . Anxiety   . Depression   . History of kidney stones   . Hypertension   . Kidney stone     Patient Active Problem List   Diagnosis Date Noted  . Suicide ideation 07/07/2013  . Major depressive disorder, recurrent severe without psychotic features ([place]) 07/06/2013    Past Surgical History:  Procedure Laterality Date  . CHOLECYSTECTOMY  2002  . INSERTION OF MESH N/A 10/28/2016   Procedure: INSERTION OF MESH;  Surgeon: Clovis [name], MD;  Location: [place];  Service: General;  Laterality: N/A;  . [place]  . VENTRAL HERNIA REPAIR N/A 10/28/2016   Procedure: LAPAROSCOPIC VENTRAL HERNIA REPAIR;  Surgeon: Clovis [name], MD;  Location: [place];  Service: General;  Laterality: N/A;  . WRIST SURGERY      OB History    Gravida  2   Para  2   Term  2   Preterm      AB      Living  2     SAB      TAB      Ectopic      Multiple      Live Births               Home Medications    Prior to Admission medications   Medication Sig Start Date End Date Taking? Authorizing Provider  benzonatate (TESSALON) 200 MG capsule Take 1 capsule (200 mg total) by mouth 3 (three) times daily as needed for up to 7 days for cough. 09/16/18  09/23/18  Avaley Coop C, PA-C  hydrochlorothiazide (HYDRODIURIL) 12.5 MG tablet Take 1 tablet (12.5 mg total) by mouth daily. 04/30/18   Antonietta Breach, PA-C  ibuprofen (ADVIL,MOTRIN) 800 MG tablet Take 1 tablet (800 mg total) by mouth 3 (three) times daily. 09/16/18   Yoseph Haile C, PA-C  oseltamivir (TAMIFLU) 75 MG capsule Take 1 capsule (75 mg total) by mouth every 12 (twelve) hours for 5 days. 09/16/18 09/21/18  Gabbriella Presswood, Elesa Hacker, PA-C    Family History Family History  Problem Relation Age of Onset  . Hypertension Other   . Cancer Other     Social History Social History   Tobacco Use  . Smoking status: Never Smoker  . Smokeless tobacco: Never Used  Substance Use Topics  . Alcohol use: Yes    Alcohol/week: 1.0 standard drinks    Types: 1 Glasses of wine per week    Comment: one glass wine twice monthly  . Drug use: No    Comment: never used drugs     Allergies   No known allergies   Review of Systems Review of Systems  Constitutional: Positive for appetite change, chills, fatigue and  fever. Negative for activity change.  HENT: Positive for congestion, rhinorrhea and sore throat. Negative for ear pain, sinus pressure and trouble swallowing.   Eyes: Negative for discharge and redness.  Respiratory: Positive for cough. Negative for chest tightness and shortness of breath.   Cardiovascular: Negative for chest pain.  Gastrointestinal: Negative for abdominal pain, diarrhea, nausea and vomiting.  Musculoskeletal: Positive for myalgias.  Skin: Negative for rash.  Neurological: Positive for headaches. Negative for dizziness and light-headedness.     Physical Exam Triage Vital Signs ED Triage Vitals  Enc Vitals Group     BP 09/16/18 1427 (!) 138/91     Pulse Rate 09/16/18 1427 (!) 107     Resp 09/16/18 1427 18     Temp 09/16/18 1427 (!) 102 F (38.9 C)     Temp Source 09/16/18 1427 Oral     SpO2 09/16/18 1427 99 %     Weight --      Height --      Head  Circumference --      Peak Flow --      Pain Score 09/16/18 1428 3     Pain Loc --      Pain Edu? --      Excl. in [place]? --    No data found.  Updated Vital Signs BP (!) 138/91 (BP Location: Left Arm)   Pulse (!) 107   Temp (!) 102 F (38.9 C) (Oral)   Resp 18   LMP 09/12/2018   SpO2 99%   Visual Acuity Right Eye Distance:   Left Eye Distance:   Bilateral Distance:    Right Eye Near:   Left Eye Near:    Bilateral Near:     Physical Exam Vitals signs and nursing note reviewed.  Constitutional:      General: She is not in acute distress.    Appearance: She is well-developed.  HENT:     Head: Normocephalic and atraumatic.     Ears:     Comments: Bilateral ears without tenderness to palpation of external auricle, tragus and mastoid, EAC's without erythema or swelling, TM's with good bony landmarks and cone of light. Non erythematous.    Nose:     Comments: Slightly erythematous and mildly swollen turbinates, no rhinorrhea    Mouth/Throat:     Comments: Oral mucosa pink and moist, no tonsillar enlargement or exudate. Posterior pharynx patent and nonerythematous, no uvula deviation or swelling. Normal phonation. Eyes:     Conjunctiva/sclera: Conjunctivae normal.  Neck:     Musculoskeletal: Neck supple.  Cardiovascular:     Rate and Rhythm: Regular rhythm. Tachycardia present.     Heart sounds: No murmur.  Pulmonary:     Effort: Pulmonary effort is normal. No respiratory distress.     Breath sounds: Normal breath sounds.     Comments: Breathing comfortably at rest, CTABL, no wheezing, rales or other adventitious sounds auscultated Abdominal:     Palpations: Abdomen is soft.     Tenderness: There is no abdominal tenderness.  Skin:    General: Skin is warm and dry.  Neurological:     Mental Status: She is alert.      UC Treatments / Results  Labs (all labs ordered are listed, but only abnormal results are displayed) Labs Reviewed - No data to  display  EKG None  Radiology No results found.  Procedures Procedures (including critical care time)  Medications Ordered in UC Medications  acetaminophen (TYLENOL) tablet 650 mg (has  no administration in time range)    Initial Impression / Assessment and Plan / UC Course  I have reviewed the triage vital signs and the nursing notes.  Pertinent labs & imaging results that were available during my care of the patient were reviewed by me and considered in my medical decision making (see chart for details).     Patient with acute onset of fever and URI symptoms, most likely influenza, exam nonfocal.  Recommending symptomatic and supportive care.  To provide prescription for Tamiflu given within Tamiflu window.  Further symptomatic recommendations below.  Continue to monitor,Discussed strict return precautions. Patient verbalized understanding and is agreeable with plan.  Final Clinical Impressions(s) / UC Diagnoses   Final diagnoses:  Influenza-like illness     Discharge Instructions     Your symptoms are most likely from the flu, the flu is a virus that runs its course over approximately 1 week.  Main treatment is symptomatic control, recommendations below.  You may also start Tamiflu twice daily for the next 5 days, but this is not required for your body to fight this off.  1. Take a daily allergy pill/anti-histamine like Zyrtec, Claritin, or Store brand consistently for 2 weeks  2. For congestion you may try an oral decongestant like Mucinex. You may also try intranasal flonase nasal spray or saline irrigations (neti pot, sinus cleanse)  3. For your sore throat you may try cepacol lozenges, salt water gargles, throat spray. Treatment of congestion may also help your sore throat.  4. For cough you may try Tessalon provided or over-the-counter Delsym, Robitussen, Mucinex DM  5. Take Tylenol or Ibuprofen to help with pain/inflammation-alternate every 4 hours to keep fever  down  6. Stay hydrated, drink plenty of fluids to keep throat coated and less irritated  Honey Tea For cough/sore throat try using a honey-based tea. Use 3 teaspoons of honey with juice squeezed from half lemon. Place shaved pieces of ginger into 1/2-1 cup of water and warm over stove top. Then mix the ingredients and repeat every 4 hours as needed.    ED Prescriptions    Medication Sig Dispense Auth. Provider   oseltamivir (TAMIFLU) 75 MG capsule Take 1 capsule (75 mg total) by mouth every 12 (twelve) hours for 5 days. 10 capsule Aadit Hagood C, PA-C   ibuprofen (ADVIL,MOTRIN) 800 MG tablet Take 1 tablet (800 mg total) by mouth 3 (three) times daily. 21 tablet Alesi Zachery C, PA-C   benzonatate (TESSALON) 200 MG capsule Take 1 capsule (200 mg total) by mouth 3 (three) times daily as needed for up to 7 days for cough. 28 capsule Broghan Pannone C, PA-C     Controlled Substance Prescriptions [place] Controlled Substance Registry consulted? Not Applicable   Janith Lima, [place] 09/16/18 1445

## 2018-09-20 ENCOUNTER — Ambulatory Visit
Admission: EM | Admit: 2018-09-20 | Discharge: 2018-09-20 | Disposition: A | Discharge status: Home / Self Care | Payer: Federal, State, Local not specified - PPO | Attending: Physician Assistant | Admitting: Physician Assistant

## 2018-09-20 DIAGNOSIS — R6889 Other general symptoms and signs: Secondary | ICD-10-CM

## 2018-09-20 DIAGNOSIS — J209 Acute bronchitis, unspecified: Secondary | ICD-10-CM

## 2018-09-20 MED ORDER — PREDNISONE 50 MG PO TABS: 50.0000 mg | ORAL_TABLET | Freq: Every day | ORAL | 0 refills | Status: DC

## 2018-09-20 MED ORDER — AMOXICILLIN-POT CLAVULANATE 875-125 MG PO TABS: 1.0000 | ORAL_TABLET | Freq: Two times a day (BID) | ORAL | 0 refills | Status: DC

## 2018-09-20 MED ORDER — FLUTICASONE PROPIONATE 50 MCG/ACT NA SUSP: 2.0000 | Freq: Every day | NASAL | 0 refills | Status: DC

## 2018-09-20 NOTE — ED Provider Notes (Signed)
[facility]    CSN: [id number] Arrival date & time: 09/20/18  1153     History   Chief Complaint Chief Complaint  Patient presents with  . Influenza    HPI Joanna Osborn is a 48 y.o. female.   48 year old female comes in for worsening symptoms after diagnosis of flu 5 days ago.  States has had worsening sinus pressure, dyspnea on exertion.  Denies wheezing.  Continues to have rhinorrhea, nasal congestion, cough.  Last fever was a few days ago.  States Tamiflu has helped with the body aches and chills.  Has not taken anything else for the symptoms.  Never smoker.     Past Medical History:  Diagnosis Date  . Anxiety   . Depression   . History of kidney stones   . Hypertension   . Kidney stone     Patient Active Problem List   Diagnosis Date Noted  . Suicide ideation 07/07/2013  . Major depressive disorder, recurrent severe without psychotic features ([place]) 07/06/2013    Past Surgical History:  Procedure Laterality Date  . CHOLECYSTECTOMY  2002  . INSERTION OF MESH N/A 10/28/2016   Procedure: INSERTION OF MESH;  Surgeon: Clovis [name], MD;  Location: [place];  Service: General;  Laterality: N/A;  . [place]  . VENTRAL HERNIA REPAIR N/A 10/28/2016   Procedure: LAPAROSCOPIC VENTRAL HERNIA REPAIR;  Surgeon: Clovis [name], MD;  Location: [place];  Service: General;  Laterality: N/A;  . WRIST SURGERY      OB History    Gravida  2   Para  2   Term  2   Preterm      AB      Living  2     SAB      TAB      Ectopic      Multiple      Live Births               Home Medications    Prior to Admission medications   Medication Sig Start Date End Date Taking? Authorizing Provider  amoxicillin-clavulanate (AUGMENTIN) 875-125 MG tablet Take 1 tablet by mouth 2 (two) times daily. 09/20/18   Tasia Catchings, Amarian Botero V, PA-C  benzonatate (TESSALON) 200 MG capsule Take 1 capsule (200 mg total) by mouth 3 (three) times daily as needed for up to 7 days  for cough. 09/16/18 09/23/18  Wieters, Hallie C, PA-C  fluticasone (FLONASE) 50 MCG/ACT nasal spray Place 2 sprays into both nostrils daily. 09/20/18   Tasia Catchings, Orien Mayhall V, PA-C  hydrochlorothiazide (HYDRODIURIL) 12.5 MG tablet Take 1 tablet (12.5 mg total) by mouth daily. 04/30/18   Antonietta Breach, PA-C  ibuprofen (ADVIL,MOTRIN) 800 MG tablet Take 1 tablet (800 mg total) by mouth 3 (three) times daily. 09/16/18   Wieters, Hallie C, PA-C  oseltamivir (TAMIFLU) 75 MG capsule Take 1 capsule (75 mg total) by mouth every 12 (twelve) hours for 5 days. 09/16/18 09/21/18  Wieters, Hallie C, PA-C  predniSONE (DELTASONE) 50 MG tablet Take 1 tablet (50 mg total) by mouth daily with breakfast. 09/20/18   Ok Edwards, PA-C    Family History Family History  Problem Relation Age of Onset  . Hypertension Other   . Cancer Other     Social History Social History   Tobacco Use  . Smoking status: Never Smoker  . Smokeless tobacco: Never Used  Substance Use Topics  . Alcohol use: Yes    Alcohol/week: 1.0  standard drinks    Types: 1 Glasses of wine per week    Comment: one glass wine twice monthly  . Drug use: No    Comment: never used drugs     Allergies   No known allergies   Review of Systems Review of Systems  Reason unable to perform ROS: See HPI as above.     Physical Exam Triage Vital Signs ED Triage Vitals  Enc Vitals Group     BP 09/20/18 1353 (!) 143/99     Pulse Rate 09/20/18 1353 85     Resp 09/20/18 1353 20     Temp 09/20/18 1353 98.3 F (36.8 C)     Temp Source 09/20/18 1353 Oral     SpO2 09/20/18 1353 97 %     Weight --      Height --      Head Circumference --      Peak Flow --      Pain Score 09/20/18 1401 5     Pain Loc --      Pain Edu? --      Excl. in [place]? --    No data found.  Updated Vital Signs BP (!) 143/99 (BP Location: Left Arm)   Pulse 85   Temp 98.3 F (36.8 C) (Oral)   Resp 20   LMP 09/12/2018   SpO2 97%   Physical Exam Constitutional:      General: She is  not in acute distress.    Appearance: She is well-developed. She is not ill-appearing, toxic-appearing or diaphoretic.  HENT:     Head: Normocephalic and atraumatic.     Right Ear: Tympanic membrane, ear canal and external ear normal. Tympanic membrane is not erythematous or bulging.     Left Ear: Ear canal and external ear normal. Tympanic membrane is erythematous. Tympanic membrane is not bulging.     Nose:     Right Sinus: Maxillary sinus tenderness and frontal sinus tenderness present.     Left Sinus: Maxillary sinus tenderness and frontal sinus tenderness present.     Mouth/Throat:     Mouth: Mucous membranes are moist.     Pharynx: Oropharynx is clear. Uvula midline.     Tonsils: Tonsillar exudate present. Swelling: 2+ on the right. 2+ on the left.  Eyes:     Conjunctiva/sclera: Conjunctivae normal.     Pupils: Pupils are equal, round, and reactive to light.  Neck:     Musculoskeletal: Normal range of motion and neck supple.  Cardiovascular:     Rate and Rhythm: Normal rate and regular rhythm.     Heart sounds: Normal heart sounds. No murmur. No friction rub. No gallop.   Pulmonary:     Effort: Pulmonary effort is normal. No accessory muscle usage, prolonged expiration, respiratory distress or retractions.     Breath sounds: Normal breath sounds. No stridor, decreased air movement or transmitted upper airway sounds. No decreased breath sounds, wheezing, rhonchi or rales.  Skin:    General: Skin is warm and dry.  Neurological:     Mental Status: She is alert and oriented to person, place, and time.      UC Treatments / Results  Labs (all labs ordered are listed, but only abnormal results are displayed) Labs Reviewed - No data to display  EKG None  Radiology No results found.  Procedures Procedures (including critical care time)  Medications Ordered in UC Medications - No data to display  Initial Impression / Assessment and Plan /  UC Course  I have reviewed the  triage vital signs and the nursing notes.  Pertinent labs & imaging results that were available during my care of the patient were reviewed by me and considered in my medical decision making (see chart for details).    Start prednisone as directed.  Continue Tamiflu.  Other symptomatic treatment discussed.  Push fluids.  Will provide written Rx of Augmentin, if experiencing ear pain, can start for otitis media.  Discussed starting Augmentin for bacterial sinusitis if symptoms still not resolving after 10 to 14 days of symptoms.  Patient expresses understanding and agrees to plan.  Final Clinical Impressions(s) / UC Diagnoses   Final diagnoses:  Flu-like symptoms  Acute bronchitis, unspecified organism    ED Prescriptions    Medication Sig Dispense Auth. Provider   predniSONE (DELTASONE) 50 MG tablet Take 1 tablet (50 mg total) by mouth daily with breakfast. 5 tablet Amika Tassin V, PA-C   amoxicillin-clavulanate (AUGMENTIN) 875-125 MG tablet Take 1 tablet by mouth 2 (two) times daily. 14 tablet Elyan Vanwieren V, PA-C   fluticasone (FLONASE) 50 MCG/ACT nasal spray Place 2 sprays into both nostrils daily. 1 g Tobin Chad, [place] 09/20/18 1507

## 2018-09-20 NOTE — ED Triage Notes (Signed)
Pt states dx'd with flu on Friday, now feeling worse. C/o SOB on exertion and severe headache

## 2018-09-20 NOTE — Discharge Instructions (Signed)
Start prednisone as directed. Continue tamiflu. Start flonase for nasal congestion/drainage. You can use over the counter nasal saline rinse such as neti pot for nasal congestion. Keep hydrated, your urine should be clear to pale yellow in color. Tylenol/motrin for fever and pain. Monitor for any worsening of symptoms, chest pain, shortness of breath, wheezing, swelling of the throat, follow up for reevaluation.    If experiencing ear pain, worsening sore throat, sinus pressure that is lasting past 5 days, can start augmentin to cover for bacterial infection.

## 2018-10-05 DIAGNOSIS — M5136 Other intervertebral disc degeneration, lumbar region: Secondary | ICD-10-CM | POA: Diagnosis not present

## 2019-03-22 DIAGNOSIS — K649 Unspecified hemorrhoids: Secondary | ICD-10-CM | POA: Diagnosis not present

## 2019-04-06 DIAGNOSIS — N760 Acute vaginitis: Secondary | ICD-10-CM | POA: Diagnosis not present

## 2019-06-13 DIAGNOSIS — Z13220 Encounter for screening for lipoid disorders: Secondary | ICD-10-CM | POA: Diagnosis not present

## 2019-06-13 DIAGNOSIS — I10 Essential (primary) hypertension: Secondary | ICD-10-CM | POA: Diagnosis not present

## 2019-06-13 DIAGNOSIS — Z23 Encounter for immunization: Secondary | ICD-10-CM | POA: Diagnosis not present

## 2019-06-13 DIAGNOSIS — E669 Obesity, unspecified: Secondary | ICD-10-CM | POA: Diagnosis not present

## 2019-06-13 DIAGNOSIS — Z0000 Encounter for general adult medical examination without abnormal findings: Secondary | ICD-10-CM | POA: Diagnosis not present

## 2019-08-01 DIAGNOSIS — Z03818 Encounter for observation for suspected exposure to other biological agents ruled out: Secondary | ICD-10-CM | POA: Diagnosis not present

## 2019-08-04 DIAGNOSIS — Z20822 Contact with and (suspected) exposure to covid-19: Secondary | ICD-10-CM | POA: Diagnosis not present

## 2019-08-07 DIAGNOSIS — M5416 Radiculopathy, lumbar region: Secondary | ICD-10-CM | POA: Diagnosis not present

## 2019-08-07 DIAGNOSIS — N62 Hypertrophy of breast: Secondary | ICD-10-CM | POA: Diagnosis not present

## 2019-08-07 DIAGNOSIS — M62830 Muscle spasm of back: Secondary | ICD-10-CM | POA: Diagnosis not present

## 2019-09-04 DIAGNOSIS — N62 Hypertrophy of breast: Secondary | ICD-10-CM | POA: Diagnosis not present

## 2019-09-05 DIAGNOSIS — M5136 Other intervertebral disc degeneration, lumbar region: Secondary | ICD-10-CM | POA: Diagnosis not present

## 2019-09-11 DIAGNOSIS — F331 Major depressive disorder, recurrent, moderate: Secondary | ICD-10-CM | POA: Diagnosis not present

## 2019-09-18 DIAGNOSIS — F331 Major depressive disorder, recurrent, moderate: Secondary | ICD-10-CM | POA: Diagnosis not present

## 2019-09-26 DIAGNOSIS — M5136 Other intervertebral disc degeneration, lumbar region: Secondary | ICD-10-CM | POA: Diagnosis not present

## 2019-09-26 DIAGNOSIS — M546 Pain in thoracic spine: Secondary | ICD-10-CM | POA: Diagnosis not present

## 2019-09-26 DIAGNOSIS — M791 Myalgia, unspecified site: Secondary | ICD-10-CM | POA: Diagnosis not present

## 2019-10-02 DIAGNOSIS — F331 Major depressive disorder, recurrent, moderate: Secondary | ICD-10-CM | POA: Diagnosis not present

## 2019-10-10 DIAGNOSIS — F331 Major depressive disorder, recurrent, moderate: Secondary | ICD-10-CM | POA: Diagnosis not present

## 2019-10-10 DIAGNOSIS — M546 Pain in thoracic spine: Secondary | ICD-10-CM | POA: Diagnosis not present

## 2019-10-16 DIAGNOSIS — F331 Major depressive disorder, recurrent, moderate: Secondary | ICD-10-CM | POA: Diagnosis not present

## 2019-10-21 DIAGNOSIS — M546 Pain in thoracic spine: Secondary | ICD-10-CM | POA: Diagnosis not present

## 2019-10-23 DIAGNOSIS — F331 Major depressive disorder, recurrent, moderate: Secondary | ICD-10-CM | POA: Diagnosis not present

## 2019-10-30 DIAGNOSIS — F331 Major depressive disorder, recurrent, moderate: Secondary | ICD-10-CM | POA: Diagnosis not present

## 2019-11-07 DIAGNOSIS — F331 Major depressive disorder, recurrent, moderate: Secondary | ICD-10-CM | POA: Diagnosis not present

## 2019-11-13 DIAGNOSIS — F331 Major depressive disorder, recurrent, moderate: Secondary | ICD-10-CM | POA: Diagnosis not present

## 2019-11-20 DIAGNOSIS — F331 Major depressive disorder, recurrent, moderate: Secondary | ICD-10-CM | POA: Diagnosis not present

## 2019-11-21 DIAGNOSIS — F332 Major depressive disorder, recurrent severe without psychotic features: Secondary | ICD-10-CM | POA: Diagnosis not present

## 2019-11-28 DIAGNOSIS — F331 Major depressive disorder, recurrent, moderate: Secondary | ICD-10-CM | POA: Diagnosis not present

## 2019-12-04 DIAGNOSIS — F331 Major depressive disorder, recurrent, moderate: Secondary | ICD-10-CM | POA: Diagnosis not present

## 2019-12-08 DIAGNOSIS — F331 Major depressive disorder, recurrent, moderate: Secondary | ICD-10-CM | POA: Diagnosis not present

## 2019-12-10 DIAGNOSIS — F332 Major depressive disorder, recurrent severe without psychotic features: Secondary | ICD-10-CM | POA: Diagnosis not present

## 2019-12-11 DIAGNOSIS — F331 Major depressive disorder, recurrent, moderate: Secondary | ICD-10-CM | POA: Diagnosis not present

## 2019-12-11 DIAGNOSIS — I10 Essential (primary) hypertension: Secondary | ICD-10-CM | POA: Diagnosis not present

## 2019-12-11 DIAGNOSIS — E559 Vitamin D deficiency, unspecified: Secondary | ICD-10-CM | POA: Diagnosis not present

## 2019-12-25 DIAGNOSIS — F331 Major depressive disorder, recurrent, moderate: Secondary | ICD-10-CM | POA: Diagnosis not present

## 2020-01-08 DIAGNOSIS — F331 Major depressive disorder, recurrent, moderate: Secondary | ICD-10-CM | POA: Diagnosis not present

## 2020-01-09 DIAGNOSIS — N9089 Other specified noninflammatory disorders of vulva and perineum: Secondary | ICD-10-CM | POA: Diagnosis not present

## 2020-01-09 DIAGNOSIS — R739 Hyperglycemia, unspecified: Secondary | ICD-10-CM | POA: Diagnosis not present

## 2020-01-15 DIAGNOSIS — F331 Major depressive disorder, recurrent, moderate: Secondary | ICD-10-CM | POA: Diagnosis not present

## 2020-01-18 DIAGNOSIS — Z113 Encounter for screening for infections with a predominantly sexual mode of transmission: Secondary | ICD-10-CM | POA: Diagnosis not present

## 2020-01-22 DIAGNOSIS — F331 Major depressive disorder, recurrent, moderate: Secondary | ICD-10-CM | POA: Diagnosis not present

## 2020-02-06 DIAGNOSIS — F332 Major depressive disorder, recurrent severe without psychotic features: Secondary | ICD-10-CM | POA: Diagnosis not present

## 2020-02-16 DIAGNOSIS — F331 Major depressive disorder, recurrent, moderate: Secondary | ICD-10-CM | POA: Diagnosis not present

## 2020-02-20 DIAGNOSIS — F331 Major depressive disorder, recurrent, moderate: Secondary | ICD-10-CM | POA: Diagnosis not present

## 2020-02-27 DIAGNOSIS — F331 Major depressive disorder, recurrent, moderate: Secondary | ICD-10-CM | POA: Diagnosis not present

## 2020-03-04 DIAGNOSIS — F332 Major depressive disorder, recurrent severe without psychotic features: Secondary | ICD-10-CM | POA: Diagnosis not present

## 2020-03-05 DIAGNOSIS — F331 Major depressive disorder, recurrent, moderate: Secondary | ICD-10-CM | POA: Diagnosis not present

## 2020-04-15 DIAGNOSIS — F331 Major depressive disorder, recurrent, moderate: Secondary | ICD-10-CM | POA: Diagnosis not present

## 2020-04-22 DIAGNOSIS — F331 Major depressive disorder, recurrent, moderate: Secondary | ICD-10-CM | POA: Diagnosis not present

## 2020-04-30 DIAGNOSIS — F331 Major depressive disorder, recurrent, moderate: Secondary | ICD-10-CM | POA: Diagnosis not present

## 2020-05-07 DIAGNOSIS — F331 Major depressive disorder, recurrent, moderate: Secondary | ICD-10-CM | POA: Diagnosis not present

## 2020-05-11 DIAGNOSIS — F332 Major depressive disorder, recurrent severe without psychotic features: Secondary | ICD-10-CM | POA: Diagnosis not present

## 2020-05-21 DIAGNOSIS — F331 Major depressive disorder, recurrent, moderate: Secondary | ICD-10-CM | POA: Diagnosis not present

## 2020-05-28 DIAGNOSIS — F331 Major depressive disorder, recurrent, moderate: Secondary | ICD-10-CM | POA: Diagnosis not present

## 2020-06-17 DIAGNOSIS — Z13220 Encounter for screening for lipoid disorders: Secondary | ICD-10-CM | POA: Diagnosis not present

## 2020-06-17 DIAGNOSIS — I10 Essential (primary) hypertension: Secondary | ICD-10-CM | POA: Diagnosis not present

## 2020-06-17 DIAGNOSIS — Z0000 Encounter for general adult medical examination without abnormal findings: Secondary | ICD-10-CM | POA: Diagnosis not present

## 2020-06-17 DIAGNOSIS — E559 Vitamin D deficiency, unspecified: Secondary | ICD-10-CM | POA: Diagnosis not present

## 2020-06-17 DIAGNOSIS — Z833 Family history of diabetes mellitus: Secondary | ICD-10-CM | POA: Diagnosis not present

## 2020-06-25 DIAGNOSIS — F331 Major depressive disorder, recurrent, moderate: Secondary | ICD-10-CM | POA: Diagnosis not present

## 2020-06-25 DIAGNOSIS — Z01419 Encounter for gynecological examination (general) (routine) without abnormal findings: Secondary | ICD-10-CM | POA: Diagnosis not present

## 2020-06-25 DIAGNOSIS — Z1231 Encounter for screening mammogram for malignant neoplasm of breast: Secondary | ICD-10-CM | POA: Diagnosis not present

## 2020-07-30 DIAGNOSIS — Z23 Encounter for immunization: Secondary | ICD-10-CM | POA: Diagnosis not present

## 2021-02-25 ENCOUNTER — Other Ambulatory Visit: Payer: Self-pay | Admitting: Obstetrics and Gynecology

## 2021-02-25 DIAGNOSIS — N6320 Unspecified lump in the left breast, unspecified quadrant: Secondary | ICD-10-CM | POA: Diagnosis not present

## 2021-02-25 DIAGNOSIS — R928 Other abnormal and inconclusive findings on diagnostic imaging of breast: Secondary | ICD-10-CM

## 2021-02-26 DIAGNOSIS — M79672 Pain in left foot: Secondary | ICD-10-CM | POA: Diagnosis not present

## 2021-02-26 DIAGNOSIS — I10 Essential (primary) hypertension: Secondary | ICD-10-CM | POA: Diagnosis not present

## 2021-02-26 DIAGNOSIS — E559 Vitamin D deficiency, unspecified: Secondary | ICD-10-CM | POA: Diagnosis not present

## 2021-03-04 DIAGNOSIS — M722 Plantar fascial fibromatosis: Secondary | ICD-10-CM | POA: Diagnosis not present

## 2021-03-05 ENCOUNTER — Ambulatory Visit
Admission: RE | Admit: 2021-03-05 | Discharge: 2021-03-05 | Disposition: A | Discharge status: Home / Self Care | Payer: Federal, State, Local not specified - PPO | Source: Ambulatory Visit | Attending: Obstetrics and Gynecology | Admitting: Obstetrics and Gynecology

## 2021-03-05 ENCOUNTER — Other Ambulatory Visit: Payer: Self-pay | Admitting: Obstetrics and Gynecology

## 2021-03-05 ENCOUNTER — Other Ambulatory Visit: Payer: Self-pay

## 2021-03-05 DIAGNOSIS — N6320 Unspecified lump in the left breast, unspecified quadrant: Secondary | ICD-10-CM

## 2021-03-05 DIAGNOSIS — R922 Inconclusive mammogram: Secondary | ICD-10-CM | POA: Diagnosis not present

## 2021-03-07 ENCOUNTER — Ambulatory Visit
Admission: RE | Admit: 2021-03-07 | Discharge: 2021-03-07 | Disposition: A | Discharge status: Home / Self Care | Payer: Federal, State, Local not specified - PPO | Source: Ambulatory Visit | Attending: Obstetrics and Gynecology | Admitting: Obstetrics and Gynecology

## 2021-03-07 ENCOUNTER — Other Ambulatory Visit: Payer: Self-pay

## 2021-03-07 ENCOUNTER — Other Ambulatory Visit: Payer: Self-pay | Admitting: Obstetrics and Gynecology

## 2021-03-07 DIAGNOSIS — N6320 Unspecified lump in the left breast, unspecified quadrant: Secondary | ICD-10-CM

## 2021-03-07 DIAGNOSIS — C50812 Malignant neoplasm of overlapping sites of left female breast: Secondary | ICD-10-CM | POA: Diagnosis not present

## 2021-03-07 DIAGNOSIS — N6321 Unspecified lump in the left breast, upper outer quadrant: Secondary | ICD-10-CM | POA: Diagnosis not present

## 2021-03-10 ENCOUNTER — Other Ambulatory Visit: Payer: Self-pay | Admitting: Obstetrics and Gynecology

## 2021-03-10 DIAGNOSIS — C50911 Malignant neoplasm of unspecified site of right female breast: Secondary | ICD-10-CM

## 2021-03-11 ENCOUNTER — Telehealth: Payer: Self-pay | Admitting: Hematology

## 2021-03-11 NOTE — Telephone Encounter (Signed)
Spoke to patient to confirm morning clinic appointment for 8/24, packet mailed to patient

## 2021-03-13 ENCOUNTER — Encounter: Payer: Self-pay | Admitting: *Deleted

## 2021-03-13 ENCOUNTER — Ambulatory Visit
Admission: RE | Admit: 2021-03-13 | Discharge: 2021-03-13 | Disposition: A | Discharge status: Home / Self Care | Payer: Federal, State, Local not specified - PPO | Source: Ambulatory Visit | Attending: Obstetrics and Gynecology | Admitting: Obstetrics and Gynecology

## 2021-03-13 ENCOUNTER — Other Ambulatory Visit: Payer: Self-pay

## 2021-03-13 DIAGNOSIS — C50412 Malignant neoplasm of upper-outer quadrant of left female breast: Secondary | ICD-10-CM

## 2021-03-13 DIAGNOSIS — C50911 Malignant neoplasm of unspecified site of right female breast: Secondary | ICD-10-CM

## 2021-03-13 DIAGNOSIS — R922 Inconclusive mammogram: Secondary | ICD-10-CM | POA: Diagnosis not present

## 2021-03-13 DIAGNOSIS — Z170 Estrogen receptor positive status [ER+]: Secondary | ICD-10-CM | POA: Insufficient documentation

## 2021-03-13 HISTORY — DX: Estrogen receptor positive status (ER+): C50.412

## 2021-03-18 DIAGNOSIS — F4323 Adjustment disorder with mixed anxiety and depressed mood: Secondary | ICD-10-CM | POA: Diagnosis not present

## 2021-03-19 ENCOUNTER — Inpatient Hospital Stay: Payer: Federal, State, Local not specified - PPO | Admitting: Licensed Clinical Social Worker

## 2021-03-19 ENCOUNTER — Encounter: Payer: Self-pay | Admitting: Genetic Counselor

## 2021-03-19 ENCOUNTER — Encounter: Payer: Self-pay | Admitting: *Deleted

## 2021-03-19 ENCOUNTER — Ambulatory Visit (HOSPITAL_BASED_OUTPATIENT_CLINIC_OR_DEPARTMENT_OTHER): Payer: Federal, State, Local not specified - PPO | Admitting: Genetic Counselor

## 2021-03-19 ENCOUNTER — Encounter: Payer: Self-pay | Admitting: Hematology

## 2021-03-19 ENCOUNTER — Ambulatory Visit: Payer: Federal, State, Local not specified - PPO | Attending: General Surgery | Admitting: Physical Therapy

## 2021-03-19 ENCOUNTER — Other Ambulatory Visit: Payer: Self-pay | Admitting: General Surgery

## 2021-03-19 ENCOUNTER — Inpatient Hospital Stay: Payer: Federal, State, Local not specified - PPO | Attending: Hematology

## 2021-03-19 ENCOUNTER — Other Ambulatory Visit: Payer: Self-pay

## 2021-03-19 ENCOUNTER — Ambulatory Visit
Admission: RE | Admit: 2021-03-19 | Discharge: 2021-03-19 | Disposition: A | Discharge status: Home / Self Care | Payer: Federal, State, Local not specified - PPO | Source: Ambulatory Visit | Attending: Radiation Oncology | Admitting: Radiation Oncology

## 2021-03-19 ENCOUNTER — Encounter: Payer: Self-pay | Admitting: Physical Therapy

## 2021-03-19 ENCOUNTER — Inpatient Hospital Stay (HOSPITAL_BASED_OUTPATIENT_CLINIC_OR_DEPARTMENT_OTHER): Payer: Federal, State, Local not specified - PPO | Admitting: Hematology

## 2021-03-19 VITALS — BP 124/78 | HR 92 | Temp 98.2°F | Resp 18 | Ht 65.0 in | Wt 227.1 lb

## 2021-03-19 DIAGNOSIS — C50412 Malignant neoplasm of upper-outer quadrant of left female breast: Secondary | ICD-10-CM

## 2021-03-19 DIAGNOSIS — Z170 Estrogen receptor positive status [ER+]: Secondary | ICD-10-CM | POA: Insufficient documentation

## 2021-03-19 DIAGNOSIS — Z800 Family history of malignant neoplasm of digestive organs: Secondary | ICD-10-CM | POA: Diagnosis not present

## 2021-03-19 DIAGNOSIS — Z809 Family history of malignant neoplasm, unspecified: Secondary | ICD-10-CM | POA: Diagnosis not present

## 2021-03-19 DIAGNOSIS — F32A Depression, unspecified: Secondary | ICD-10-CM | POA: Insufficient documentation

## 2021-03-19 DIAGNOSIS — I10 Essential (primary) hypertension: Secondary | ICD-10-CM

## 2021-03-19 DIAGNOSIS — R293 Abnormal posture: Secondary | ICD-10-CM | POA: Diagnosis not present

## 2021-03-19 HISTORY — DX: Family history of malignant neoplasm of digestive organs: Z80.0

## 2021-03-19 LAB — CMP (CANCER CENTER ONLY)
ALT: 21 U/L (ref 0–44)
AST: 21 U/L (ref 15–41)
Albumin: 3.9 g/dL (ref 3.5–5.0)
Alkaline Phosphatase: 52 U/L (ref 38–126)
Anion gap: 11 (ref 5–15)
BUN: 12 mg/dL (ref 6–20)
CO2: 24 mmol/L (ref 22–32)
Calcium: 9.2 mg/dL (ref 8.9–10.3)
Chloride: 103 mmol/L (ref 98–111)
Creatinine: 0.83 mg/dL (ref 0.44–1.00)
GFR, Estimated: >60 mL/min (ref >60)
Glucose, Bld: 127 mg/dL — ABNORMAL HIGH (ref 70–99)
Potassium: 3.6 mmol/L (ref 3.5–5.1)
Sodium: 138 mmol/L (ref 135–145)
Total Bilirubin: 0.6 mg/dL (ref 0.3–1.2)
Total Protein: 7.2 g/dL (ref 6.5–8.1)

## 2021-03-19 LAB — CBC WITH DIFFERENTIAL (CANCER CENTER ONLY)
Abs Immature Granulocytes: 0.02 10*3/uL (ref 0.00–0.07)
Basophils Absolute: 0 10*3/uL (ref 0.0–0.1)
Basophils Relative: 1 %
Eosinophils Absolute: 0.2 10*3/uL (ref 0.0–0.5)
Eosinophils Relative: 3 %
HCT: 35.6 % — ABNORMAL LOW (ref 36.0–46.0)
Hemoglobin: 11.3 g/dL — ABNORMAL LOW (ref 12.0–15.0)
Immature Granulocytes: 0 %
Lymphocytes Relative: 31 %
Lymphs Abs: 2 10*3/uL (ref 0.7–4.0)
MCH: 25.7 pg — ABNORMAL LOW (ref 26.0–34.0)
MCHC: 31.7 g/dL (ref 30.0–36.0)
MCV: 81.1 fL (ref 80.0–100.0)
Monocytes Absolute: 0.3 10*3/uL (ref 0.1–1.0)
Monocytes Relative: 5 %
Neutro Abs: 3.9 10*3/uL (ref 1.7–7.7)
Neutrophils Relative %: 60 %
Platelet Count: 492 10*3/uL — ABNORMAL HIGH (ref 150–400)
RBC: 4.39 MIL/uL (ref 3.87–5.11)
RDW: 14.8 % (ref 11.5–15.5)
WBC Count: 6.5 10*3/uL (ref 4.0–10.5)
nRBC: 0 % (ref 0.0–0.2)

## 2021-03-19 LAB — GENETIC SCREENING ORDER

## 2021-03-19 MED ORDER — LIDOCAINE-PRILOCAINE 2.5-2.5 % EX CREA: TOPICAL_CREAM | CUTANEOUS | 3 refills | Status: DC

## 2021-03-19 MED ORDER — ONDANSETRON HCL 8 MG PO TABS: 8.0000 mg | ORAL_TABLET | Freq: Two times a day (BID) | ORAL | 1 refills | Status: DC | PRN

## 2021-03-19 MED ORDER — PROCHLORPERAZINE MALEATE 10 MG PO TABS: 10.0000 mg | ORAL_TABLET | Freq: Four times a day (QID) | ORAL | 1 refills | Status: DC | PRN

## 2021-03-19 MED ORDER — DEXAMETHASONE 4 MG PO TABS: 4.0000 mg | ORAL_TABLET | Freq: Two times a day (BID) | ORAL | 1 refills | Status: DC

## 2021-03-19 NOTE — Progress Notes (Addendum)
Joanna Osborn   [phone] Fax:([phone]) [place] Note   Patient Care Team: Lennie Odor, [place] as PCP - General (Physician Assistant) Rockwell Germany, RN as Oncology Nurse Navigator Mauro Kaufmann, RN as Oncology Nurse Navigator Rolm Bookbinder, MD as Consulting Physician (General Surgery) Truitt Merle, MD as Consulting Physician (Hematology) Kyung Rudd, MD as Consulting Physician (Radiation Oncology)  Date of Service:  03/19/2021   CHIEF COMPLAINTS/PURPOSE OF CONSULTATION:  Left Breast Cancer, ER+  REFERRING PHYSICIAN:  [place]   Oncology History Overview Note  Cancer Staging Malignant neoplasm of upper-outer quadrant of left breast in female, estrogen receptor positive ([place]) Staging form: Breast, AJCC 8th Edition - Clinical stage from 03/07/2021: Stage IB (cT2, cN0, cM0, G3, ER+, PR+, HER2+) - Signed by Truitt Merle, MD on 03/17/2021 Stage prefix: Initial diagnosis Histologic grading system: 3 grade system    Malignant neoplasm of upper-outer quadrant of left breast in female, estrogen receptor positive ([place])  03/05/2021 Mammogram   Diagnostic Left Mammogram; Left Breast Ultrasound  IMPRESSION: At the palpable site of concern in the left breast at 12 o'clock there is a suspicious mass measuring 2.1 cm.   03/07/2021 Cancer Staging   Staging form: Breast, AJCC 8th Edition - Clinical stage from 03/07/2021: Stage IB (cT2, cN0, cM0, G3, ER+, PR+, HER2+) - Signed by Truitt Merle, MD on 03/17/2021 Stage prefix: Initial diagnosis Histologic grading system: 3 grade system   03/07/2021 Pathology Results   Diagnosis Breast, left, needle core biopsy, left breast 12 o' clock - INVASIVE DUCTAL CARCINOMA - SEE COMMENT Based on the biopsy, the carcinoma appears Nottingham grade 3 of 3  PROGNOSTIC INDICATORS Results: IMMUNOHISTOCHEMICAL AND MORPHOMETRIC ANALYSIS PERFORMED MANUALLY The tumor cells are POSITIVE for Her2 (3+). Estrogen  Receptor: 30%, POSITIVE, WEAK STAINING INTENSITY Progesterone Receptor: 15%, POSITIVE, MODERATE STAINING INTENSITY Proliferation Marker Ki67: 40%     03/13/2021 Initial Diagnosis   Malignant neoplasm of upper-outer quadrant of left breast in female, estrogen receptor positive ([place])   03/26/2021 -  Chemotherapy    Patient is on Treatment Plan: BREAST  DOCETAXEL + CARBOPLATIN + TRASTUZUMAB + PERTUZUMAB  (TCHP) Q21D           HISTORY OF PRESENTING ILLNESS:  Joanna Osborn 50 y.o. female is a here because of breast cancer. The patient was referred by breast center. The patient presents to the clinic today accompanied by her husband.   She presented with a palpable lump in the left breast for approximately one week. She underwent left diagnostic mammography and left breast ultrasonography on 03/05/21 showing: 2.1 cm mass in left breast at 12 o'clock at palpable site of concern.  Biopsy on 03/07/21 showed: invasive ductal carcinoma, grade 3. Prognostic indicators significant for: estrogen receptor, 30% weakly positive and progesterone receptor, 15% moderately positive. Proliferation marker Ki67 at 40%. HER2 positive.    Today the patient notes they felt/feeling prior/after... -She denies any breast pain or skin changes. Her last mammogram was in 05/2020 at [facility] and was normal. She denies any weight or energy change. -She notes her anxiety has increased with her diagnosis.   She has a PMHx of.... -sinus headaches -HTN, on medication -depression and anxiety, diagnosed about 7 years ago -h/o kidney stones -s/p tubal ligation   Socially... -she reports her paternal grandfather and grandmother both had colon cancer (age unknown). She also notes a paternal aunt with cancer, unknown type. -never smoker -drinks alcohol 1-2 times a month -currently  works as a Probation officer.   GYN HISTORY  Menarchal: 50 years old LMP: 03/09/21 Contraceptive: HRT: n/a GP: 2 (27 and  24) She notes fibroids for the last 10 years   REVIEW OF SYSTEMS:    Constitutional: Denies fevers, chills or abnormal night sweats Eyes: Denies blurriness of vision, double vision or watery eyes Ears, nose, mouth, throat, and face: Denies mucositis or sore throat Respiratory: Denies cough, dyspnea or wheezes Cardiovascular: Denies palpitation, chest discomfort or lower extremity swelling Gastrointestinal:  Denies nausea, heartburn or change in bowel habits Skin: Denies abnormal skin rashes Lymphatics: Denies new lymphadenopathy or easy bruising Neurological:Denies numbness, tingling or new weaknesses Behavioral/Psych: (+) anxiety  All other systems were reviewed with the patient and are negative.   MEDICAL HISTORY:  Past Medical History:  Diagnosis Date   Anxiety    Breast cancer ([place])    Depression    History of kidney stones    Hypertension     SURGICAL HISTORY: Past Surgical History:  Procedure Laterality Date   CHOLECYSTECTOMY  2002   INSERTION OF MESH N/A 10/28/2016   Procedure: INSERTION OF MESH;  Surgeon: Clovis [name], MD;  Location: [place];  Service: General;  Laterality: N/A;   [place] N/A 10/28/2016   Procedure: LAPAROSCOPIC VENTRAL HERNIA REPAIR;  Surgeon: Clovis [name], MD;  Location: [place];  Service: General;  Laterality: N/A;   WRIST SURGERY      SOCIAL HISTORY: Social History   Socioeconomic History   Marital status: Married    Spouse name: Not on file   Number of children: 2   Years of education: Not on file   Highest education level: Not on file  Occupational History   Not on file  Tobacco Use   Smoking status: Never   Smokeless tobacco: Never  Substance and Sexual Activity   Alcohol use: Yes    Alcohol/week: 1.0 standard drink    Types: 1 Glasses of wine per week    Comment: one glass wine twice monthly   Drug use: No    Comment: never used drugs   Sexual activity: Yes    Birth control/protection:  None  Other Topics Concern   Not on file  Social History Narrative   Not on file   Social Determinants of Health   Financial Resource Strain: Not on file  Food Insecurity: No Food Insecurity   Worried About Charity fundraiser in the Last Year: Never true   Ran Out of Food in the Last Year: Never true  Transportation Needs: No Transportation Needs   Lack of Transportation (Medical): No   Lack of Transportation (Non-Medical): No  Physical Activity: Not on file  Stress: Not on file  Social Connections: Not on file  Intimate Partner Violence: Not on file    FAMILY HISTORY: Family History  Problem Relation Age of Onset   Kidney disease Mother    Cancer Paternal Aunt        unknown type cancer   Cancer Paternal Grandmother        colon cancer   Cancer Paternal Grandfather        colon cancer   Hypertension Other    Cancer Other     ALLERGIES:  is allergic to no known allergies.  MEDICATIONS:  Current Outpatient Medications  Medication Sig Dispense Refill   hydrochlorothiazide (HYDRODIURIL) 25 MG tablet Take 25 mg by mouth daily.     No current  facility-administered medications for this visit.    PHYSICAL EXAMINATION: ECOG PERFORMANCE STATUS: 0 - Asymptomatic  Vitals:   03/19/21 0911  BP: 124/78  Pulse: 92  Resp: 18  Temp: 98.2 F (36.8 C)  SpO2: 100%   Filed Weights   03/19/21 0911  Weight: 227 lb 1.6 oz (103 kg)    GENERAL:alert, no distress and comfortable SKIN: skin color, texture, turgor are normal, no rashes or significant lesions EYES: normal, Conjunctiva are pink and non-injected, sclera clear  NECK: supple, thyroid normal size, non-tender, without nodularity LYMPH:  no palpable lymphadenopathy in the cervical, axillary  LUNGS: clear to auscultation and percussion with normal breathing effort HEART: regular rate & rhythm and no murmurs and no lower extremity edema ABDOMEN:abdomen soft, non-tender and normal bowel sounds Musculoskeletal:no  cyanosis of digits and no clubbing  NEURO: alert & oriented x 3 with fluent speech, no focal motor/sensory deficits BREAST: 4.5 x 4 cm palpable left breast mass; No palpable adenopathy bilaterally.  LABORATORY DATA:  I have reviewed the data as listed CBC Latest Ref Rng & Units 03/19/2021 04/29/2018 10/21/2016  WBC 4.0 - 10.5 K/uL 6.5 7.9 7.1  Hemoglobin 12.0 - 15.0 g/dL 11.3(L) 13.1 12.7  Hematocrit 36.0 - 46.0 % 35.6(L) 41.1 39.9  Platelets 150 - 400 K/uL 492(H) 456(H) 419(H)    CMP Latest Ref Rng & Units 03/19/2021 04/29/2018 10/21/2016  Glucose 70 - 99 mg/dL 127(H) 99 116(H)  BUN 6 - 20 mg/dL 12 13 10   Creatinine 0.44 - 1.00 mg/dL 0.83 0.60 0.61  Sodium 135 - 145 mmol/L 138 142 141  Potassium 3.5 - 5.1 mmol/L 3.6 3.9 4.1  Chloride 98 - 111 mmol/L 103 109 110  CO2 22 - 32 mmol/L 24 22 21(L)  Calcium 8.9 - 10.3 mg/dL 9.2 9.8 9.4  Total Protein 6.5 - 8.1 g/dL 7.2 - -  Total Bilirubin 0.3 - 1.2 mg/dL 0.6 - -  Alkaline Phos 38 - 126 U/L 52 - -  AST 15 - 41 U/L 21 - -  ALT 0 - 44 U/L 21 - -     RADIOGRAPHIC STUDIES: I have personally reviewed the radiological images as listed and agreed with the findings in the report. US BREAST LTD UNI LEFT INC AXILLA  Result Date: 03/05/2021 CLINICAL DATA:  50 year old female presenting with a new lump in the left breast for approximately 1 week. EXAM: DIGITAL DIAGNOSTIC UNILATERAL LEFT MAMMOGRAM WITH TOMOSYNTHESIS AND CAD; ULTRASOUND LEFT BREAST LIMITED TECHNIQUE: Left digital diagnostic mammography and breast tomosynthesis was performed. The images were evaluated with computer-aided detection.; Targeted ultrasound examination of the left breast was performed. COMPARISON:  Previous exam(s). ACR Breast Density Category c: The breast tissue is heterogeneously dense, which may obscure small masses. FINDINGS: Mammogram: Left breast: A skin BB marks the site of concern reported by the patient in the superior central left breast. A spot tangential view of  areas were performed in addition to standard views. At the palpable site there is a new irregular mass measuring approximately 2.2 cm. On physical exam at the site of concern reported by the patient I feel a discrete mass. Ultrasound: Targeted ultrasound is performed at the palpable site of concern in the left breast at 12 o'clock 4 cm from the nipple demonstrating an irregular hypoechoic mass measuring 2.1 x 1.9 x 2.1 cm. Targeted ultrasound of the left axilla demonstrates several normal lymph nodes. IMPRESSION: At the palpable site of concern in the left breast at 12 o'clock there is a suspicious  mass measuring 2.1 cm. RECOMMENDATION: Ultrasound-guided core needle biopsy of the left breast mass at 12 o'clock. I have discussed the findings and recommendations with the patient. If applicable, a reminder letter will be sent to the patient regarding the next appointment. BI-RADS CATEGORY  4: Suspicious. Electronically Signed   By: Audie Pinto M.D.   On: 03/05/2021 09:58  MM DIAG BREAST TOMO UNI LEFT  Result Date: 03/05/2021 CLINICAL DATA:  50 year old female presenting with a new lump in the left breast for approximately 1 week. EXAM: DIGITAL DIAGNOSTIC UNILATERAL LEFT MAMMOGRAM WITH TOMOSYNTHESIS AND CAD; ULTRASOUND LEFT BREAST LIMITED TECHNIQUE: Left digital diagnostic mammography and breast tomosynthesis was performed. The images were evaluated with computer-aided detection.; Targeted ultrasound examination of the left breast was performed. COMPARISON:  Previous exam(s). ACR Breast Density Category c: The breast tissue is heterogeneously dense, which may obscure small masses. FINDINGS: Mammogram: Left breast: A skin BB marks the site of concern reported by the patient in the superior central left breast. A spot tangential view of areas were performed in addition to standard views. At the palpable site there is a new irregular mass measuring approximately 2.2 cm. On physical exam at the site of concern  reported by the patient I feel a discrete mass. Ultrasound: Targeted ultrasound is performed at the palpable site of concern in the left breast at 12 o'clock 4 cm from the nipple demonstrating an irregular hypoechoic mass measuring 2.1 x 1.9 x 2.1 cm. Targeted ultrasound of the left axilla demonstrates several normal lymph nodes. IMPRESSION: At the palpable site of concern in the left breast at 12 o'clock there is a suspicious mass measuring 2.1 cm. RECOMMENDATION: Ultrasound-guided core needle biopsy of the left breast mass at 12 o'clock. I have discussed the findings and recommendations with the patient. If applicable, a reminder letter will be sent to the patient regarding the next appointment. BI-RADS CATEGORY  4: Suspicious. Electronically Signed   By: Audie Pinto M.D.   On: 03/05/2021 09:58  MM DIAG BREAST TOMO UNI RIGHT  Result Date: 03/13/2021 CLINICAL DATA:  50 year old female with newly diagnosed left breast cancer presents for mammographic evaluation of the right breast. EXAM: DIGITAL DIAGNOSTIC UNILATERAL RIGHT MAMMOGRAM WITH TOMOSYNTHESIS AND CAD TECHNIQUE: Right digital diagnostic mammography and breast tomosynthesis was performed. The images were evaluated with computer-aided detection. COMPARISON:  Previous exam(s). ACR Breast Density Category c: The breast tissue is heterogeneously dense, which may obscure small masses. FINDINGS: No new or suspicious mammographic findings are identified within the right breast. The parenchymal pattern is stable. IMPRESSION: No mammographic evidence of malignancy on the right. RECOMMENDATION: Per clinical treatment plan for the patient's newly diagnosed left breast cancer. I have discussed the findings and recommendations with the patient. If applicable, a reminder letter will be sent to the patient regarding the next appointment. BI-RADS CATEGORY  1: Negative. Electronically Signed   By: Kristopher Oppenheim M.D.   On: 03/13/2021 15:24  MM CLIP PLACEMENT  LEFT  Result Date: 03/07/2021 CLINICAL DATA:  Patient status post ultrasound-guided core needle biopsy left breast mass. EXAM: 3D DIAGNOSTIC LEFT MAMMOGRAM POST ULTRASOUND BIOPSY COMPARISON:  Previous exam(s). FINDINGS: 3D Mammographic images were obtained following ultrasound guided biopsy of left breast mass 12 o'clock position. The biopsy marking clip is in expected position at the site of biopsy. IMPRESSION: Appropriate positioning of the ribbon shaped biopsy marking clip at the site of biopsy in the left breast mass 12 o'clock position. Final Assessment: Post Procedure Mammograms for Marker Placement Electronically  Signed   By: Lovey Newcomer M.D.   On: 03/07/2021 12:14  [place] LT BREAST BX W LOC DEV 1ST LESION IMG BX SPEC US GUIDE  Addendum Date: 03/17/2021   ADDENDUM REPORT: 03/10/2021 14:26 ADDENDUM: Pathology revealed GRADE III INVASIVE DUCTAL CARCINOMA of the LEFT breast, 12 o'clock, (ribbon clip). This was found to be concordant by Dr. Lovey Newcomer. Pathology results were discussed with the patient by telephone. The patient reported doing well after the biopsy with tenderness at the site. Post biopsy instructions and care were reviewed and questions were answered. The patient was encouraged to call [facility] for any additional concerns. My direct phone number was provided. The patient was referred to [facility] on March 19, 2021. The patient is scheduled for RIGHT diagnostic mammogram on March 13, 2021. Pathology results reported by Terie Purser, RN on 03/10/2021. Electronically Signed   By: Lovey Newcomer M.D.   On: 03/10/2021 14:26   Result Date: 03/17/2021 CLINICAL DATA:  Patient with suspicious left breast mass 12 o'clock position. EXAM: ULTRASOUND GUIDED LEFT BREAST CORE NEEDLE BIOPSY COMPARISON:  Previous exam(s). PROCEDURE: I met with the patient and we discussed the procedure of  ultrasound-guided biopsy, including benefits and alternatives. We discussed the high likelihood of a successful procedure. We discussed the risks of the procedure, including infection, bleeding, tissue injury, clip migration, and inadequate sampling. Informed written consent was given. The usual time-out protocol was performed immediately prior to the procedure. Lesion quadrant: Upper outer quadrant Using sterile technique and 1% Lidocaine as local anesthetic, under direct ultrasound visualization, a 14 gauge spring-loaded device was used to perform biopsy of left breast mass 12 o'clock position using a medial approach. At the conclusion of the procedure ribbon tissue marker clip was deployed into the biopsy cavity. Follow up 2 view mammogram was performed and dictated separately. IMPRESSION: Ultrasound guided biopsy of left breast mass 12 o'clock position. No apparent complications. Electronically Signed: By: Lovey Newcomer M.D. On: 03/07/2021 12:13   ASSESSMENT & PLAN:  OREAN GIARRATANO is a 50 y.o. pre-menopausal female with a history of   1. 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 Left mammogram on 03/05/21 showed 2.1 cm mass at 12 o'clock. Biopsy confirmed invasive ductal carcinoma, grade 3, weakly ER+ and Her2+ --We discussed her imaging findings and the biopsy results in great details.  Her breast tumor measures about 4.5 cm on physical exam today. -Patient was seen by breast surgeon Dr. Donne Hazel today, will proceed lumpectomy and sentinel lymph node biopsy. -We discussed the risk of cancer recurrence.  Due to HER2 positive disease, and the rapid growth of her tumor, she had a high risk for recurrence. -Given her Her2+ disease, I recommend neoadjuvant TCHP every 3 weeks for 6 cycles, followed by Herceptin w or wo Perjeta maintenance therapy for a total of one year.  We also discussed chemotherapy  in the adjuvant setting.  Given the relatively large size on exam, I think neoadjuvant chemo is more beneficial. --Chemotherapy consent: Side effects including but does not not limited to, fatigue, nausea, vomiting, diarrhea, hair loss, neuropathy, fluid retention, renal and kidney dysfunction, neutropenic fever, needed for blood transfusion, bleeding, reversible cardiomyopathy, were discussed with patient in great detail. She agrees to proceed. -We will obtain an MRI of breast and echocardiogram prior to starting treatment. -  We discussed chemo-induced infertility, patient does not want more children. -She would also benefit from adjuvant radiation, seen by Dr. Lisbeth Renshaw today. --Giving her strongly ER and PR positivity of the tumor cells, and her pre-menopause status, I recommend adjuvant endocrine therapy with tamoxifen. The potential side effects, which includes but not limited to, hot flash, skin and vaginal dryness, slightly increased risk of cardiovascular disease and cataract, small risk of thrombosis and endometrial cancer, were discussed with her in great details. Will start after she completes adjuvant breast radiation. -We also discussed ovarian suppression and aromatase inhibitor, which will be preferred adjuvant hormonal therapy.  She is interested. -We also discussed the breast cancer surveillance after her surgery. She will continue annual screening mammogram, self exam, and a routine office visit with lab and exam with [place]. -I encouraged her to have healthy diet and exercise regularly.   2. Anxiety and depression, HTN -f/u PCP -continue HCTZ, will monitor her blood pressure closely during chemotherapy -Referral to social worker for counseling  PLAN:  -breast MRI w wo contrast  -port placement by Dr. Donne Hazel  -baseline echocardiogram -chemotherapy education class -lab and f/u with first cycle TCHP with GCSF in 1-2 weeks    Orders Placed This Encounter  Procedures   MR BREAST  BILATERAL W WO CONTRAST INC CAD    BCBS  Epic ORDER PF:03/13/21 [facility]  NO CYCLE: DX: NEOPLASM UPPER OUTER WT:227 HT:5'5 NO COVID// NO NEEDS/ NO CLAUS// NO METAL REMOVED/ NO IMPLANTS/ NO BULLETS OR BB'S/ NO PACEMAKER PORT GLUCOSE MONITOR, SPINAL STIMULATOR OR INJECTORS/ NO BRAIN HEART EYE OR EAR SX//NO Hx of BRCA// NO Breast SX//NKDA TO IV DYE CONTRAST// AC SPW ZADA ///INFORMED OF CANCELLATION POLICY     Standing Status:   Future    Standing Expiration Date:   03/19/2022    Order Specific Question:   If indicated for the ordered procedure, I authorize the administration of contrast media per Radiology protocol    Answer:   Yes    Order Specific Question:   What is the patient's sedation requirement?    Answer:   No Sedation    Order Specific Question:   Does the patient have a pacemaker or implanted devices?    Answer:   No    Order Specific Question:   Radiology Contrast Protocol - do NOT remove file path    Answer:   [email or web address]    Order Specific Question:   Preferred imaging location?    Answer:   [facility] (table limit-550lbs)   ECHOCARDIOGRAM COMPLETE    Standing Status:   Future    Standing Expiration Date:   03/19/2022    Order Specific Question:   Where should this test be performed    Answer:   [place]    Order Specific Question:   Perflutren DEFINITY (image enhancing agent) should be administered unless hypersensitivity or allergy exist    Answer:   Administer Perflutren    Order Specific Question:   Reason for exam-Echo    Answer:   Chemo  Z09     All questions were answered. The patient knows to call the clinic with any problems, questions or concerns. The total time spent in the appointment was 60 minutes.     Truitt Merle, MD 03/19/2021 12:53 PM  I, Wilburn Mylar, am acting as scribe for Truitt Merle, MD.   I have reviewed the above documentation for accuracy and completeness, and I agree with the above.

## 2021-03-19 NOTE — Progress Notes (Signed)
[place] Work  Initial Assessment   TEMPLE HAZELIP is a 50 y.o. year old female accompanied by husband. Clinical Social Work was referred by [facility] for assessment of psychosocial needs.   SDOH (Social Determinants of Health) assessments performed: Yes SDOH Interventions    Flowsheet Row Most Recent Value  SDOH Interventions   Food Insecurity Interventions Intervention Not Indicated  Housing Interventions Intervention Not Indicated  Transportation Interventions Intervention Not Indicated       Distress Screen completed: Yes ONCBCN DISTRESS SCREENING 03/19/2021  Screening Type Initial Screening  Distress experienced in past week (1-10) 7  Emotional problem type Depression;Nervousness/Anxiety  Information Concerns Type Lack of info about diagnosis;Lack of info about treatment      Family/Social Information:  Housing Arrangement: patient lives with husband Barbaraann Rondo) and 72 yo son Dorothea Ogle). Adult daughter Lauralyn Primes lives locally Family members/support persons in your life? 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 Income source: Employment and husband's employment Financial concerns: No Type of concern: None Food access concerns: no Religious or spiritual practice: not discussed Medication Concerns: no  Services Currently in place:  professional counseling  Coping/ Adjustment to diagnosis: Patient understands treatment plan and what happens next? yes Concerns about diagnosis and/or treatment:  general adjustment to diagnosis Patient reported stressors: Depression and Anxiety Patient enjoys time with family/ friends and games on phone, old TV shows Current coping skills/ strengths: Capable of independent living, Motivation for treatment/growth, and Supportive family/friends    SUMMARY: Current SDOH Barriers:  No significant unaddressed barriers noted  today  Clinical Social Work Clinical Goal(s):  No clinical social work goals at this time  Interventions: Discussed common feeling and emotions when being diagnosed with cancer, and the importance of support during treatment Informed patient of the support team roles and support services at [facility] Provided [place] contact information and encouraged patient to call with any questions or concerns   Follow Up Plan: Patient will contact CSW with any support or resource needs Patient verbalizes understanding of plan: Yes    Christeen Douglas LCSW

## 2021-03-19 NOTE — Progress Notes (Signed)
REFERRING PROVIDER: Truitt Merle, MD [address] [place],  [place] [address]  PRIMARY PROVIDER:  Larina Earthly, MD  PRIMARY REASON FOR VISIT:  Encounter Diagnoses  Name Primary?   Malignant neoplasm of upper-outer quadrant of left breast in female, estrogen receptor positive ([facility]) Yes   Family history of colon cancer      HISTORY OF PRESENT ILLNESS:   Ms. Liwanag, a 50 y.o. female, was seen for a [facility] cancer genetics consultation during the breast multidisciplinary clinic at the request of Dr. Burr Medico due to a personal history of cancer.  Ms. Bojarski presents to clinic today to discuss the possibility of a hereditary predisposition to cancer, to discuss genetic testing, and to further clarify her future cancer risks, as well as potential cancer risks for family members.   In August 2022, at the age of 77, Ms. Hindes was diagnosed with invasive ductal carcinoma of the left breast (ER+/PR+/HER2-). The preliminary treatment plan includes neoadjuvant chemotherapy.    CANCER HISTORY:  Oncology History  Malignant neoplasm of upper-outer quadrant of left breast in female, estrogen receptor positive ([facility])  03/05/2021 Mammogram   Diagnostic Left Mammogram; Left Breast Ultrasound  IMPRESSION: At the palpable site of concern in the left breast at 12 o'clock there is a suspicious mass measuring 2.1 cm.   03/07/2021 Cancer Staging   Staging form: Breast, AJCC 8th Edition - Clinical stage from 03/07/2021: Stage IB (cT2, cN0, cM0, G3, ER+, PR+, HER2+) - Signed by Truitt Merle, MD on 03/17/2021 Stage prefix: Initial diagnosis Histologic grading system: 3 grade system   03/07/2021 Pathology Results   Diagnosis Breast, left, needle core biopsy, left breast 12 o' clock - INVASIVE DUCTAL CARCINOMA - SEE COMMENT Based on the biopsy, the carcinoma appears Nottingham grade 3 of 3  PROGNOSTIC INDICATORS Results: IMMUNOHISTOCHEMICAL AND MORPHOMETRIC ANALYSIS PERFORMED MANUALLY The tumor cells are  POSITIVE for Her2 (3+). Estrogen Receptor: 30%, POSITIVE, WEAK STAINING INTENSITY Progesterone Receptor: 15%, POSITIVE, MODERATE STAINING INTENSITY Proliferation Marker Ki67: 40%     03/13/2021 Initial Diagnosis   Malignant neoplasm of upper-outer quadrant of left breast in female, estrogen receptor positive ([facility])   03/26/2021 -  Chemotherapy    Patient is on Treatment Plan: BREAST  DOCETAXEL + CARBOPLATIN + TRASTUZUMAB + PERTUZUMAB  (TCHP) Q21D          RISK FACTORS:  Menarche was at age 13.  First live birth at age 54.  OCP use for approximately 7 years.  Ovaries intact: yes.  Hysterectomy: no.  Menopausal status: premenopausal.  HRT use: 0 years. Colonoscopy: yes; 2003 Mammogram within the last year: yes. Up to date with pelvic exams: most recent PAP in Nov 2021  Past Medical History:  Diagnosis Date   Anxiety    Depression    History of kidney stones    Hypertension    Kidney stone     Past Surgical History:  Procedure Laterality Date   CHOLECYSTECTOMY  2002   INSERTION OF MESH N/A 10/28/2016   Procedure: INSERTION OF MESH;  Surgeon: Clovis [name], MD;  Location: [facility];  Service: General;  Laterality: N/A;   [place] N/A 10/28/2016   Procedure: LAPAROSCOPIC VENTRAL HERNIA REPAIR;  Surgeon: Clovis [name], MD;  Location: [facility];  Service: General;  Laterality: N/A;   WRIST SURGERY      Social History   Socioeconomic History   Marital status: Married    Spouse name: Not on file  Number of children: Not on file   Years of education: Not on file   Highest education level: Not on file  Occupational History   Not on file  Tobacco Use   Smoking status: Never   Smokeless tobacco: Never  Substance and Sexual Activity   Alcohol use: Yes    Alcohol/week: 1.0 standard drink    Types: 1 Glasses of wine per week    Comment: one glass wine twice monthly   Drug use: No    Comment: never used drugs   Sexual activity: Yes     Birth control/protection: None  Other Topics Concern   Not on file  Social History Narrative   Not on file   Social Determinants of Health   Financial Resource Strain: Not on file  Food Insecurity: Not on file  Transportation Needs: Not on file  Physical Activity: Not on file  Stress: Not on file  Social Connections: Not on file     FAMILY HISTORY:  We obtained a detailed, 4-generation family history.  Significant diagnoses are listed below: Family History  Problem Relation Age of Onset   Cancer Paternal Aunt        unknown type; dx after 71   Colon cancer Paternal Grandmother        dx unknown age   Colon cancer Paternal Grandfather        dx after 65   Cancer Cousin        unknown type; dx before 69     Ms. Feider is unaware of previous family history of genetic testing for hereditary cancer risks. There is no reported Ashkenazi Jewish ancestry. There is no known consanguinity.  GENETIC COUNSELING ASSESSMENT: Ms. Gills is a 50 y.o. female with a personal history of cancer which is somewhat suggestive of a hereditary cancer syndrome and predisposition to cancer given her age of diagnosis and limited family history information (including no siblings/lack of information about cancer types in several close relatives). We, therefore, discussed and recommended the following at today's visit.   DISCUSSION: We discussed that 5 - 10% of cancer is hereditary, with most cases of hereditary breast cancer associated with mutations in BRCA1/2.  There are other genes that can be associated with hereditary breast cancer syndromes.  Type of cancer risk and level of risk are gene-specific.  We discussed that testing is beneficial for several reasons including knowing how to follow individuals after completing their treatment, identifying whether potential treatment options would be beneficial, and understanding if other family members could be at risk for cancer and allowing them to undergo genetic  testing.   We reviewed the characteristics, features and inheritance patterns of hereditary cancer syndromes. We also discussed genetic testing, including the appropriate family members to test, the process of testing, insurance coverage and turn-around-time for results. We discussed the implications of a negative, positive, carrier and/or variant of uncertain significant result. We recommended Ms. Lehew pursue genetic testing for a panel that includes genes associated with breast and colon cancer cancer.   The CustomNext-Cancer+RNAinsight panel offered by [facility] includes sequencing and rearrangement analysis for the following 47 genes:  APC, ATM, AXIN2, BARD1, BMPR1A, BRCA1, BRCA2, BRIP1, CDH1, CDK4, CDKN2A, CHEK2, DICER1, EPCAM, GREM1, HOXB13, MEN1, MLH1, MSH2, MSH3, MSH6, MUTYH, NBN, NF1, NF2, NTHL1, PALB2, PMS2, POLD1, POLE, PTEN, RAD51C, RAD51D, RECQL, RET, SDHA, SDHAF2, SDHB, SDHC, SDHD, SMAD4, SMARCA4, STK11, TP53, TSC1, TSC2, and VHL.  RNA data is routinely analyzed for use in variant interpretation for all  genes.  Based on Ms. Deol's personal history of breast cancer and limited information regarding family history, she meets medical criteria for genetic testing. Despite that she meets criteria, she may still have an out of pocket cost. We discussed that if her out of pocket cost for testing is over $100, the laboratory should contact her to discuss self-pay prices and patient pay assistance programs.   PLAN: After considering the risks, benefits, and limitations, Ms. Paulsen provided informed consent to pursue genetic testing and the blood sample was sent to [facility] for analysis of the CustomCancer-Next + RNAinsight Panel. Results should be available within approximately 3 weeks' time, at which point they will be disclosed by telephone to Ms. Hollopeter, as will any additional recommendations warranted by these results. Ms. Mcclees will receive a summary of her genetic counseling visit  and a copy of her results once available. This information will also be available in Epic.   Lastly, we encouraged Ms. Degeorge to remain in contact with cancer genetics annually so that we can continuously update the family history and inform her of any changes in cancer genetics and testing that may be of benefit for this family.   Ms. Vandekamp questions were answered to her satisfaction today. Our contact information was provided should additional questions or concerns arise. Thank you for the referral and allowing [place] to share in the care of your patient.   Amai Cappiello M. Joette Catching, [place], [facility] Genetic Counselor [email or web address] (P) [phone]  The patient was seen for a total of 20 minutes in face-to-face genetic counseling.  The patient was accompanied by her husband, Barbaraann Rondo.  Drs. Magrinat, Lindi Adie and/or Burr Medico were available to discuss this case as needed.   _______________________________________________________________________ For Office Staff:  Number of people involved in session: 2 Was an Intern/ student involved with case: no

## 2021-03-19 NOTE — Patient Instructions (Signed)

## 2021-03-19 NOTE — Progress Notes (Signed)
START ON PATHWAY REGIMEN - Breast     A cycle is every 21 days:     Pertuzumab      Pertuzumab      Trastuzumab-xxxx      Trastuzumab-xxxx      Carboplatin      Docetaxel   **Always confirm dose/schedule in your pharmacy ordering system**  Patient Characteristics: Preoperative or Nonsurgical Candidate (Clinical Staging), Neoadjuvant Therapy followed by Surgery, Invasive Disease, Chemotherapy, HER2 Positive, ER Positive Therapeutic Status: Preoperative or Nonsurgical Candidate (Clinical Staging) AJCC M Category: cM0 AJCC Grade: G3 Breast Surgical Plan: Neoadjuvant Therapy followed by Surgery ER Status: Positive (+) AJCC 8 Stage Grouping: IB HER2 Status: Positive (+) AJCC T Category: cT2 AJCC N Category: cN0 PR Status: Positive (+) Intent of Therapy: Curative Intent, Discussed with Patient 

## 2021-03-19 NOTE — Progress Notes (Addendum)
Radiation Oncology         [phone]) [phone] ________________________________  Name: Joanna Osborn        MRN: [id number]  Date of Service: 03/19/2021 DOB: February 21, 1971  AX:[id number], Lavella Lemons, MD  Rolm Bookbinder, MD     REFERRING PHYSICIAN: Rolm Bookbinder, MD   DIAGNOSIS: The encounter diagnosis was Malignant neoplasm of upper-outer quadrant of left breast in female, estrogen receptor positive ([place]).   HISTORY OF PRESENT ILLNESS: Joanna Osborn is a 50 y.o. female seen in the multidisciplinary breast clinic for a new diagnosis of left breast cancer. The patient was noted to have a palapable mass for one week in the left breast. Diagnostic imaging revealed a 2.1 cm mass in the 12:00 position. By ultrasound this measured 2.1 cm as well. The axilla was negative for adenopathy. 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 She's seen today to discuss treatment recommendations for her cancer.  PREVIOUS RADIATION THERAPY: No   PAST MEDICAL HISTORY:  Past Medical History:  Diagnosis Date   Anxiety    Depression    History of kidney stones    Hypertension    Kidney stone        PAST SURGICAL HISTORY: Past Surgical History:  Procedure Laterality Date   CHOLECYSTECTOMY  2002   INSERTION OF MESH N/A 10/28/2016   Procedure: INSERTION OF MESH;  Surgeon: Clovis [name], MD;  Location: [facility];  Service: General;  Laterality: N/A;   [place] N/A 10/28/2016   Procedure: LAPAROSCOPIC VENTRAL HERNIA REPAIR;  Surgeon: Clovis [name], MD;  Location: [facility];  Service: General;  Laterality: N/A;   WRIST SURGERY       FAMILY HISTORY:  Family History  Problem Relation Age of Onset   Hypertension Other    Cancer Other      SOCIAL HISTORY:  reports that she has never smoked. She has never used smokeless tobacco. She reports current alcohol use of about 1.0 standard drink per week. She reports  that she does not use drugs.The patient is married and lives in [place]. She has a 27 year old grandson she sees quite often. 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   ALLERGIES: No known allergies   MEDICATIONS:  Current Outpatient Medications  Medication Sig Dispense Refill   amoxicillin-clavulanate (AUGMENTIN) 875-125 MG tablet Take 1 tablet by mouth 2 (two) times daily. 14 tablet 0   fluticasone (FLONASE) 50 MCG/ACT nasal spray Place 2 sprays into both nostrils daily. 1 g 0   hydrochlorothiazide (HYDRODIURIL) 12.5 MG tablet Take 1 tablet (12.5 mg total) by mouth daily. 30 tablet 1   ibuprofen (ADVIL,MOTRIN) 800 MG tablet Take 1 tablet (800 mg total) by mouth 3 (three) times daily. 21 tablet 0   predniSONE (DELTASONE) 50 MG tablet Take 1 tablet (50 mg total) by mouth daily with breakfast. 5 tablet 0   No current facility-administered medications for this encounter.     REVIEW OF SYSTEMS: On review of systems, the patient reports that she is doing well overall. She's taking in all the information she's receiving. No specific breast complaints are noted.     PHYSICAL EXAM:  Wt Readings from Last 3 Encounters:  04/29/18 226 lb (102.5 kg)  08/09/17 218 lb (98.9 kg)  10/28/16 216 lb (98 kg)   Temp Readings from Last  3 Encounters:  09/20/18 98.3 F (36.8 C) (Oral)  09/16/18 (!) 102 F (38.9 C) (Oral)  04/29/18 99.4 F (37.4 C) (Oral)   BP Readings from Last 3 Encounters:  09/20/18 (!) 143/99  09/16/18 (!) 138/91  04/30/18 (!) 142/92   Pulse Readings from Last 3 Encounters:  09/20/18 85  09/16/18 (!) 107  04/30/18 82    In general this is a well appearing African American female in no acute distress. She's alert and oriented x4 and appropriate throughout the examination. Cardiopulmonary assessment is negative for acute distress and she exhibits normal effort. Bilateral breast exam is deferred.    ECOG = 0  0 - Asymptomatic (Fully active,  able to carry on all predisease activities without restriction)  1 - Symptomatic but completely ambulatory (Restricted in physically strenuous activity but ambulatory and able to carry out work of a light or sedentary nature. For example, light housework, office work)  2 - Symptomatic, <50% in bed during the day (Ambulatory and capable of all self care but unable to carry out any work activities. Up and about more than 50% of waking hours)  3 - Symptomatic, >50% in bed, but not bedbound (Capable of only limited self-care, confined to bed or chair 50% or more of waking hours)  4 - Bedbound (Completely disabled. Cannot carry on any self-care. Totally confined to bed or chair)  5 - Death   Eustace Pen MM, Creech RH, Tormey DC, et al. [phone]). "Toxicity and response criteria of the [facility]". [place] Oncol. 5 (6): 649-55    LABORATORY DATA:  Lab Results  Component Value Date   WBC 7.9 04/29/2018   HGB 13.1 04/29/2018   HCT 41.1 04/29/2018   MCV 90.9 04/29/2018   PLT 456 (H) 04/29/2018   Lab Results  Component Value Date   NA 142 04/29/2018   K 3.9 04/29/2018   CL 109 04/29/2018   CO2 22 04/29/2018   Lab Results  Component Value Date   ALT 17 08/20/2016   AST 19 08/20/2016   ALKPHOS 43 08/20/2016   BILITOT 1.0 08/20/2016      RADIOGRAPHY: US BREAST LTD UNI LEFT INC AXILLA  Result Date: 03/05/2021 CLINICAL DATA:  50 year old female presenting with a new lump in the left breast for approximately 1 week. EXAM: DIGITAL DIAGNOSTIC UNILATERAL LEFT MAMMOGRAM WITH TOMOSYNTHESIS AND CAD; ULTRASOUND LEFT BREAST LIMITED TECHNIQUE: Left digital diagnostic mammography and breast tomosynthesis was performed. The images were evaluated with computer-aided detection.; Targeted ultrasound examination of the left breast was performed. COMPARISON:  Previous exam(s). ACR Breast Density Category c: The breast tissue is heterogeneously dense, which may obscure small masses.  FINDINGS: Mammogram: Left breast: A skin BB marks the site of concern reported by the patient in the superior central left breast. A spot tangential view of areas were performed in addition to standard views. At the palpable site there is a new irregular mass measuring approximately 2.2 cm. On physical exam at the site of concern reported by the patient I feel a discrete mass. Ultrasound: Targeted ultrasound is performed at the palpable site of concern in the left breast at 12 o'clock 4 cm from the nipple demonstrating an irregular hypoechoic mass measuring 2.1 x 1.9 x 2.1 cm. Targeted ultrasound of the left axilla demonstrates several normal lymph nodes. IMPRESSION: At the palpable site of concern in the left breast at 12 o'clock there is a suspicious mass measuring 2.1 cm. RECOMMENDATION: Ultrasound-guided core needle biopsy of the  left breast mass at 12 o'clock. I have discussed the findings and recommendations with the patient. If applicable, a reminder letter will be sent to the patient regarding the next appointment. BI-RADS CATEGORY  4: Suspicious. Electronically Signed   By: Audie Pinto M.D.   On: 03/05/2021 09:58  MM DIAG BREAST TOMO UNI LEFT  Result Date: 03/05/2021 CLINICAL DATA:  50 year old female presenting with a new lump in the left breast for approximately 1 week. EXAM: DIGITAL DIAGNOSTIC UNILATERAL LEFT MAMMOGRAM WITH TOMOSYNTHESIS AND CAD; ULTRASOUND LEFT BREAST LIMITED TECHNIQUE: Left digital diagnostic mammography and breast tomosynthesis was performed. The images were evaluated with computer-aided detection.; Targeted ultrasound examination of the left breast was performed. COMPARISON:  Previous exam(s). ACR Breast Density Category c: The breast tissue is heterogeneously dense, which may obscure small masses. FINDINGS: Mammogram: Left breast: A skin BB marks the site of concern reported by the patient in the superior central left breast. A spot tangential view of areas were performed  in addition to standard views. At the palpable site there is a new irregular mass measuring approximately 2.2 cm. On physical exam at the site of concern reported by the patient I feel a discrete mass. Ultrasound: Targeted ultrasound is performed at the palpable site of concern in the left breast at 12 o'clock 4 cm from the nipple demonstrating an irregular hypoechoic mass measuring 2.1 x 1.9 x 2.1 cm. Targeted ultrasound of the left axilla demonstrates several normal lymph nodes. IMPRESSION: At the palpable site of concern in the left breast at 12 o'clock there is a suspicious mass measuring 2.1 cm. RECOMMENDATION: Ultrasound-guided core needle biopsy of the left breast mass at 12 o'clock. I have discussed the findings and recommendations with the patient. If applicable, a reminder letter will be sent to the patient regarding the next appointment. BI-RADS CATEGORY  4: Suspicious. Electronically Signed   By: Audie Pinto M.D.   On: 03/05/2021 09:58  MM DIAG BREAST TOMO UNI RIGHT  Result Date: 03/13/2021 CLINICAL DATA:  50 year old female with newly diagnosed left breast cancer presents for mammographic evaluation of the right breast. EXAM: DIGITAL DIAGNOSTIC UNILATERAL RIGHT MAMMOGRAM WITH TOMOSYNTHESIS AND CAD TECHNIQUE: Right digital diagnostic mammography and breast tomosynthesis was performed. The images were evaluated with computer-aided detection. COMPARISON:  Previous exam(s). ACR Breast Density Category c: The breast tissue is heterogeneously dense, which may obscure small masses. FINDINGS: No new or suspicious mammographic findings are identified within the right breast. The parenchymal pattern is stable. IMPRESSION: No mammographic evidence of malignancy on the right. RECOMMENDATION: Per clinical treatment plan for the patient's newly diagnosed left breast cancer. I have discussed the findings and recommendations with the patient. If applicable, a reminder letter will be sent to the patient  regarding the next appointment. BI-RADS CATEGORY  1: Negative. Electronically Signed   By: Kristopher Oppenheim M.D.   On: 03/13/2021 15:24  MM CLIP PLACEMENT LEFT  Result Date: 03/07/2021 CLINICAL DATA:  Patient status post ultrasound-guided core needle biopsy left breast mass. EXAM: 3D DIAGNOSTIC LEFT MAMMOGRAM POST ULTRASOUND BIOPSY COMPARISON:  Previous exam(s). FINDINGS: 3D Mammographic images were obtained following ultrasound guided biopsy of left breast mass 12 o'clock position. The biopsy marking clip is in expected position at the site of biopsy. IMPRESSION: Appropriate positioning of the ribbon shaped biopsy marking clip at the site of biopsy in the left breast mass 12 o'clock position. Final Assessment: Post Procedure Mammograms for Marker Placement Electronically Signed   By: Lovey Newcomer M.D.   On:  03/07/2021 12:14  [place] LT BREAST BX W LOC DEV 1ST LESION IMG BX SPEC US GUIDE  Addendum Date: 03/17/2021   ADDENDUM REPORT: 03/10/2021 14:26 ADDENDUM: Pathology revealed GRADE III INVASIVE DUCTAL CARCINOMA of the LEFT breast, 12 o'clock, (ribbon clip). This was found to be concordant by Dr. Lovey Newcomer. Pathology results were discussed with the patient by telephone. The patient reported doing well after the biopsy with tenderness at the site. Post biopsy instructions and care were reviewed and questions were answered. The patient was encouraged to call [facility] for any additional concerns. My direct phone number was provided. The patient was referred to [facility] on March 19, 2021. The patient is scheduled for RIGHT diagnostic mammogram on March 13, 2021. Pathology results reported by Terie Purser, RN on 03/10/2021. Electronically Signed   By: Lovey Newcomer M.D.   On: 03/10/2021 14:26   Result Date: 03/17/2021 CLINICAL DATA:  Patient with suspicious left breast mass 12 o'clock position. EXAM:  ULTRASOUND GUIDED LEFT BREAST CORE NEEDLE BIOPSY COMPARISON:  Previous exam(s). PROCEDURE: I met with the patient and we discussed the procedure of ultrasound-guided biopsy, including benefits and alternatives. We discussed the high likelihood of a successful procedure. We discussed the risks of the procedure, including infection, bleeding, tissue injury, clip migration, and inadequate sampling. Informed written consent was given. The usual time-out protocol was performed immediately prior to the procedure. Lesion quadrant: Upper outer quadrant Using sterile technique and 1% Lidocaine as local anesthetic, under direct ultrasound visualization, a 14 gauge spring-loaded device was used to perform biopsy of left breast mass 12 o'clock position using a medial approach. At the conclusion of the procedure ribbon tissue marker clip was deployed into the biopsy cavity. Follow up 2 view mammogram was performed and dictated separately. IMPRESSION: Ultrasound guided biopsy of left breast mass 12 o'clock position. No apparent complications. Electronically Signed: By: Lovey Newcomer M.D. On: 03/07/2021 12:13      IMPRESSION/PLAN: 1. 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  Dr. Lisbeth Renshaw discusses the pathology findings and reviews the nature of left breast disease. The consensus from the breast conference includes neoadjuvant chemotherapy followed by breast conservation with lumpectomy with  sentinel node biopsy. Dr. Lisbeth Renshaw discusses the rationale for external radiotherapy to the breast  to reduce risks of local recurrence followed by antiestrogen therapy. We discussed the risks, benefits, short, and long term effects of radiotherapy, as well as the curative intent, and the patient is interested in proceeding. Dr. Lisbeth Renshaw discusses the delivery and logistics of radiotherapy and anticipates a course of 4 or 6 1/2 weeks of radiotherapy with deep inspiration breath hold technique. We will  see her back a few weeks after surgery to discuss the simulation process and anticipate we starting radiotherapy about 4-6 weeks after surgery.  2. Possible genetic predisposition to malignancy. The patient is a candidate for genetic testing given her personal and family history. She was offered referral and will meet with genetics in the near future. 3. Contraceptive Counseling. The patient has had a tubal ligation and does not need pregnancy testing prior to simulation or radiotherapy.    In a visit lasting 60 minutes, greater than 50% of the time was spent face to face reviewing her case, as well as in preparation of, discussing, and coordinating the patient's care.  The above documentation reflects my direct findings during this shared  patient visit. Please see the separate note by Dr. Lisbeth Renshaw on this date for the remainder of the patient's plan of care.    Carola Rhine, [facility]    **Disclaimer: This note was dictated with voice recognition software. Similar sounding words can inadvertently be transcribed and this note may contain transcription errors which may not have been corrected upon publication of note.**

## 2021-03-19 NOTE — Therapy (Signed)
[place], [place], [address] Phone: [phone]   Fax:  [phone]  Physical Therapy Evaluation  Patient Details  Name: Joanna Osborn MRN: [id number] Date of Birth: Dec 02, 1970 Referring Provider (PT): Dr. Rolm Bookbinder   Encounter Date: 03/19/2021   PT End of Session - 03/19/21 1104     Visit Number 1    Number of Visits 2    Date for PT Re-Evaluation 09/19/21    PT Start Time 0952    PT Stop Time 6962   Also saw pt from 1036-1043 for a total of 28 min   PT Time Calculation (min) 21 min    Activity Tolerance Patient tolerated treatment well    Behavior During Therapy [facility] for tasks assessed/performed             Past Medical History:  Diagnosis Date   Anxiety    Breast cancer ([place])    Depression    History of kidney stones    Hypertension     Past Surgical History:  Procedure Laterality Date   CHOLECYSTECTOMY  2002   INSERTION OF MESH N/A 10/28/2016   Procedure: INSERTION OF MESH;  Surgeon: Clovis [name], MD;  Location: [place];  Service: General;  Laterality: N/A;   Canon N/A 10/28/2016   Procedure: LAPAROSCOPIC [place];  Surgeon: Clovis [name], MD;  Location: [place];  Service: General;  Laterality: N/A;   WRIST SURGERY      There were no vitals filed for this visit.    Subjective Assessment - 03/19/21 1054     Subjective Patient reports she is here today to be seen by her medical team for her newly diagnosed left breast cancer.    Patient is accompained by: Family member    Pertinent History Patient was diagnosed on 03/05/2021 with left grade III invasive ductal carcinoma breast cancer. It measures 2.1 cm and is located in the upper outer quadrant. It is triple posiitve with a Ki67 of 40%.    Patient Stated Goals Reduce lymphedema risk and learn post op ROM HEP    Currently in Pain? No/denies   None today but has an HNP in  L-spine that can be painful               [facility] PT Assessment - 03/19/21 0001       Assessment   Medical Diagnosis Left breast cancer    Referring Provider (PT) Dr. Rolm Bookbinder    Onset Date/Surgical Date 03/05/21    Hand Dominance Right    Prior Therapy None      Precautions   Precautions Other (comment)    Precaution Comments active cancer      Restrictions   Weight Bearing Restrictions No      Balance Screen   Has the patient fallen in the past 6 months No    Has the patient had a decrease in activity level because of a fear of falling?  No    Is the patient reluctant to leave their home because of a fear of falling?  No      Home Environment   Living Environment Private residence    Living Arrangements Spouse/significant other;Children   Husband and 37 y.o. son   Available Help at Discharge Family      Prior Function   Level of Independence Independent    Vocation Full time employment  Vocation Requirements Hairstylist    Leisure She does not exercise      Cognition   Overall Cognitive Status Within Functional Limits for tasks assessed      Posture/Postural Control   Posture/Postural Control Postural limitations    Postural Limitations Rounded Shoulders;Forward head      ROM / Strength   AROM / PROM / Strength AROM;Strength      AROM   Overall AROM Comments Cervical AROM is WNL    AROM Assessment Site Shoulder    Right/Left Shoulder Right;Left    Right Shoulder Extension 48 Degrees    Right Shoulder Flexion 146 Degrees    Right Shoulder ABduction 158 Degrees    Right Shoulder Internal Rotation 72 Degrees    Right Shoulder External Rotation 90 Degrees    Left Shoulder Extension 39 Degrees    Left Shoulder Flexion 151 Degrees    Left Shoulder ABduction 161 Degrees    Left Shoulder Internal Rotation 76 Degrees    Left Shoulder External Rotation 90 Degrees      Strength   Overall Strength Within functional limits for tasks performed                LYMPHEDEMA/ONCOLOGY QUESTIONNAIRE - 03/19/21 0001       Type   Cancer Type Left breast cancer      Lymphedema Assessments   Lymphedema Assessments Upper extremities      Right Upper Extremity Lymphedema   10 cm Proximal to Olecranon Process 36.1 cm    Olecranon Process 28 cm    10 cm Proximal to Ulnar Styloid Process 24.5 cm    Just Proximal to Ulnar Styloid Process 16.3 cm    Across Hand at [employer] 20.5 cm    At [place] of 2nd Digit 6.5 cm      Left Upper Extremity Lymphedema   10 cm Proximal to Olecranon Process 36.7 cm    Olecranon Process 27.9 cm    10 cm Proximal to Ulnar Styloid Process 22.8 cm    Just Proximal to Ulnar Styloid Process 15.8 cm    Across Hand at [employer] 19.6 cm    At [place] of 2nd Digit 6.2 cm             L-DEX FLOWSHEETS - 03/19/21 1100       L-DEX LYMPHEDEMA SCREENING   Measurement Type Unilateral    L-DEX MEASUREMENT EXTREMITY Upper Extremity    POSITION  Standing    DOMINANT SIDE Right    At Risk Side Left    BASELINE SCORE (UNILATERAL) -2.7             The patient was assessed using the L-Dex machine today to produce a lymphedema index baseline score. The patient will be reassessed on a regular basis (typically every 3 months) to obtain new L-Dex scores. If the score is > 6.5 points away from his/her baseline score indicating onset of subclinical lymphedema, it will be recommended to wear a compression garment for 4 weeks, 12 hours per day and then be reassessed. If the score continues to be > 6.5 points from baseline at reassessment, we will initiate lymphedema treatment. Assessing in this manner has a 95% rate of preventing clinically significant lymphedema.      Katina Dung - 03/19/21 0001     Open a tight or new jar No difficulty    Do heavy household chores (wash walls, wash floors) No difficulty    Carry a shopping  bag or briefcase No difficulty    Wash your back No difficulty    Use a knife to cut food  No difficulty    Recreational activities in which you take some force or impact through your arm, shoulder, or hand (golf, hammering, tennis) No difficulty    During the past week, to what extent has your arm, shoulder or hand problem interfered with your normal social activities with family, friends, neighbors, or groups? Not at all    During the past week, to what extent has your arm, shoulder or hand problem limited your work or other regular daily activities Not at all    Arm, shoulder, or hand pain. None    Tingling (pins and needles) in your arm, shoulder, or hand None    Difficulty Sleeping No difficulty    DASH Score 0 %              Objective measurements completed on examination: See above findings.        Patient was instructed today in a home exercise program today for post op shoulder range of motion. These included active assist shoulder flexion in sitting, scapular retraction, wall walking with shoulder abduction, and hands behind head external rotation.  She was encouraged to do these twice a day, holding 3 seconds and repeating 5 times when permitted by her physician.         PT Education - 03/19/21 1104     Education Details Lymphedema risk reduction and post op shoulder ROM HEP    Person(s) Educated Patient;Spouse    Methods Explanation;Demonstration;Handout    Comprehension Returned demonstration;Verbalized understanding                 PT Long Term Goals - 03/19/21 1109       PT LONG TERM GOAL #1   Title Patient will demonstrate she has regained full shoulder ROM and function post operatively compared to baselines.    Time 6    Period Months    Status New    Target Date 09/19/21             Breast Clinic Goals - 03/19/21 1108       Patient will be able to verbalize understanding of pertinent lymphedema risk reduction practices relevant to her diagnosis specifically related to skin care.   Time 1    Period Days    Status Achieved       Patient will be able to return demonstrate and/or verbalize understanding of the post-op home exercise program related to regaining shoulder range of motion.   Time 1    Period Days    Status Achieved      Patient will be able to verbalize understanding of the importance of attending the postoperative After Breast Cancer Class for further lymphedema risk reduction education and therapeutic exercise.   Time 1    Period Days    Status Achieved                   Plan - 03/19/21 1056     Clinical Impression Statement Patient was diagnosed on 03/05/2021 with left grade III invasive ductal carcinoma breast cancer. It measures 2.1 cm and is located in the upper outer quadrant. It is triple posiitve with a Ki67 of 40%. Her multidisciplinary medical team met prior ot her assessments to determine a recommended treatment plan. She is planning to have neoadjuvant chemotherapy followed by a left lumpectomy and sentinel node biopsy, radiation, and  anti-estrogen therapy. She will benefit from a post op PT reassessment to determine needs and from L-Dex screens every 3 months for 2 years to detect subclinical lymphedema.    Stability/Clinical Decision Making Stable/Uncomplicated    Clinical Decision Making Low    Rehab Potential Excellent    PT Frequency --   Eval and 1 f/u visit   PT Treatment/Interventions ADLs/Self Care Home Management;Therapeutic exercise;Patient/family education    PT Next Visit Plan Will reassess 3-4 weeks post op    PT Home Exercise Plan Post op shoulder ROM HEP    Consulted and Agree with Plan of Care Patient;Family member/caregiver    Family Member Consulted Husband             Patient will benefit from skilled therapeutic intervention in order to improve the following deficits and impairments:  Postural dysfunction, Decreased range of motion, Decreased knowledge of precautions, Impaired UE functional use, Pain  Visit Diagnosis: Malignant neoplasm of  upper-outer quadrant of left breast in female, estrogen receptor positive ([place]) - Plan: PT plan of care cert/re-cert  Abnormal posture - Plan: PT plan of care cert/re-cert  Patient will follow up at outpatient cancer rehab 3-4 weeks following surgery.  If the patient requires physical therapy at that time, a specific plan will be dictated and sent to the referring physician for approval. The patient was educated today on appropriate basic range of motion exercises to begin post operatively and the importance of attending the After Breast Cancer class following surgery.  Patient was educated today on lymphedema risk reduction practices as it pertains to recommendations that will benefit the patient immediately following surgery.  She verbalized good understanding.      Problem List Patient Active Problem List   Diagnosis Date Noted   Malignant neoplasm of upper-outer quadrant of left breast in female, estrogen receptor positive ([place]) 03/13/2021   Suicide ideation 07/07/2013   Major depressive disorder, recurrent severe without psychotic features ([place]) 07/06/2013   Annia Friendly, PT 03/19/21 11:45 AM   [place], [place], [address] Phone: [phone]   Fax:  [phone]  Name: Joanna Osborn MRN: [id number] Date of Birth: 06/03/1971

## 2021-03-20 ENCOUNTER — Telehealth: Payer: Self-pay | Admitting: *Deleted

## 2021-03-20 ENCOUNTER — Encounter: Payer: Self-pay | Admitting: *Deleted

## 2021-03-20 ENCOUNTER — Telehealth: Payer: Self-pay | Admitting: Hematology

## 2021-03-20 NOTE — Telephone Encounter (Signed)
Spoke to pt regarding bmdc from 8.24.22. Denies questions or concerns regarding dx or treatment care plan. Encourage pt to call with needs. Received verbal understanding. Confirmed further appts.

## 2021-03-20 NOTE — Telephone Encounter (Signed)
Spoke to patient to confirm change in time for echo and new appointment day and time for chemo education, also explained that she is the only one allowed in the treatment at this time

## 2021-03-20 NOTE — Telephone Encounter (Signed)
Scheduled appts per 8/25 sch msg. Pt aware.  

## 2021-03-24 ENCOUNTER — Other Ambulatory Visit: Payer: Federal, State, Local not specified - PPO

## 2021-03-25 ENCOUNTER — Encounter (HOSPITAL_BASED_OUTPATIENT_CLINIC_OR_DEPARTMENT_OTHER): Payer: Self-pay | Admitting: General Surgery

## 2021-03-25 ENCOUNTER — Other Ambulatory Visit: Payer: Self-pay

## 2021-03-25 MED FILL — Dexamethasone Sodium Phosphate Inj 100 MG/10ML: INTRAMUSCULAR | Qty: 1 | Status: AC

## 2021-03-25 MED FILL — Fosaprepitant Dimeglumine For IV Infusion 150 MG (Base Eq): INTRAVENOUS | Qty: 5 | Status: AC

## 2021-03-26 ENCOUNTER — Inpatient Hospital Stay: Payer: Federal, State, Local not specified - PPO

## 2021-03-26 ENCOUNTER — Ambulatory Visit
Admission: RE | Admit: 2021-03-26 | Discharge: 2021-03-26 | Disposition: A | Discharge status: Home / Self Care | Payer: Federal, State, Local not specified - PPO | Source: Ambulatory Visit | Attending: Hematology | Admitting: Hematology

## 2021-03-26 ENCOUNTER — Encounter (HOSPITAL_BASED_OUTPATIENT_CLINIC_OR_DEPARTMENT_OTHER)
Admission: RE | Admit: 2021-03-26 | Discharge: 2021-03-26 | Disposition: A | Discharge status: Home / Self Care | Payer: Federal, State, Local not specified - PPO | Source: Ambulatory Visit | Attending: General Surgery | Admitting: General Surgery

## 2021-03-26 DIAGNOSIS — Z170 Estrogen receptor positive status [ER+]: Secondary | ICD-10-CM

## 2021-03-26 DIAGNOSIS — Z01810 Encounter for preprocedural cardiovascular examination: Secondary | ICD-10-CM | POA: Diagnosis not present

## 2021-03-26 DIAGNOSIS — C50412 Malignant neoplasm of upper-outer quadrant of left female breast: Secondary | ICD-10-CM

## 2021-03-26 DIAGNOSIS — C50912 Malignant neoplasm of unspecified site of left female breast: Secondary | ICD-10-CM | POA: Diagnosis not present

## 2021-03-26 MED ORDER — GADOBUTROL 1 MMOL/ML IV SOLN
10.0000 mL | Freq: Once | INTRAVENOUS | Status: AC | PRN
  Administered 2021-03-26: 10 mL via INTRAVENOUS

## 2021-03-26 NOTE — Progress Notes (Signed)

## 2021-03-27 ENCOUNTER — Other Ambulatory Visit: Payer: Self-pay | Admitting: Hematology

## 2021-03-27 ENCOUNTER — Encounter: Payer: Self-pay | Admitting: *Deleted

## 2021-03-27 ENCOUNTER — Telehealth: Payer: Self-pay | Admitting: *Deleted

## 2021-03-27 DIAGNOSIS — R928 Other abnormal and inconclusive findings on diagnostic imaging of breast: Secondary | ICD-10-CM

## 2021-03-27 NOTE — Progress Notes (Signed)
Pharmacist Chemotherapy Monitoring - Initial Assessment    Anticipated start date: 04/03/21   The following has been reviewed per standard work regarding the patient's treatment regimen: The patient's diagnosis, treatment plan and drug doses, and organ/hematologic function Lab orders and baseline tests specific to treatment regimen  The treatment plan start date, drug sequencing, and pre-medications Prior authorization status  Patient's documented medication list, including drug-drug interaction screen and prescriptions for anti-emetics and supportive care specific to the treatment regimen The drug concentrations, fluid compatibility, administration routes, and timing of the medications to be used The patient's access for treatment and lifetime cumulative dose history, if applicable  The patient's medication allergies and previous infusion related reactions, if applicable   Changes made to treatment plan:  N/A  Follow up needed:  Pending authorization for treatment    Larene Beach, [place], 03/27/2021  11:18 AM

## 2021-03-27 NOTE — Telephone Encounter (Signed)
Called pt with MRI results and recommendations for right US/bx. Received verbal understanding. Denies further questions or needs

## 2021-04-02 ENCOUNTER — Ambulatory Visit (HOSPITAL_BASED_OUTPATIENT_CLINIC_OR_DEPARTMENT_OTHER): Payer: Federal, State, Local not specified - PPO | Admitting: Certified Registered Nurse Anesthetist

## 2021-04-02 ENCOUNTER — Other Ambulatory Visit: Payer: Self-pay

## 2021-04-02 ENCOUNTER — Encounter: Payer: Self-pay | Admitting: Hematology

## 2021-04-02 ENCOUNTER — Other Ambulatory Visit: Payer: Federal, State, Local not specified - PPO

## 2021-04-02 ENCOUNTER — Encounter (HOSPITAL_BASED_OUTPATIENT_CLINIC_OR_DEPARTMENT_OTHER): Payer: Self-pay | Admitting: General Surgery

## 2021-04-02 ENCOUNTER — Encounter (HOSPITAL_BASED_OUTPATIENT_CLINIC_OR_DEPARTMENT_OTHER)
Admission: RE | Disposition: A | Discharge status: Home / Self Care | Payer: Self-pay | Source: Home / Self Care | Attending: General Surgery

## 2021-04-02 ENCOUNTER — Ambulatory Visit (HOSPITAL_BASED_OUTPATIENT_CLINIC_OR_DEPARTMENT_OTHER)
Admission: RE | Admit: 2021-04-02 | Discharge: 2021-04-02 | Disposition: A | Discharge status: Home / Self Care | Payer: Federal, State, Local not specified - PPO | Source: Ambulatory Visit | Attending: Hematology | Admitting: Hematology

## 2021-04-02 ENCOUNTER — Other Ambulatory Visit (HOSPITAL_COMMUNITY): Payer: Federal, State, Local not specified - PPO

## 2021-04-02 ENCOUNTER — Ambulatory Visit (HOSPITAL_BASED_OUTPATIENT_CLINIC_OR_DEPARTMENT_OTHER)
Admission: RE | Admit: 2021-04-02 | Discharge: 2021-04-02 | Disposition: A | Discharge status: Home / Self Care | Payer: Federal, State, Local not specified - PPO | Attending: General Surgery | Admitting: General Surgery

## 2021-04-02 ENCOUNTER — Ambulatory Visit (HOSPITAL_COMMUNITY): Payer: Federal, State, Local not specified - PPO

## 2021-04-02 DIAGNOSIS — C50912 Malignant neoplasm of unspecified site of left female breast: Secondary | ICD-10-CM | POA: Diagnosis not present

## 2021-04-02 DIAGNOSIS — Z419 Encounter for procedure for purposes other than remedying health state, unspecified: Secondary | ICD-10-CM

## 2021-04-02 DIAGNOSIS — Z452 Encounter for adjustment and management of vascular access device: Secondary | ICD-10-CM | POA: Diagnosis not present

## 2021-04-02 DIAGNOSIS — C50412 Malignant neoplasm of upper-outer quadrant of left female breast: Secondary | ICD-10-CM

## 2021-04-02 DIAGNOSIS — Z79899 Other long term (current) drug therapy: Secondary | ICD-10-CM | POA: Diagnosis not present

## 2021-04-02 DIAGNOSIS — Z885 Allergy status to narcotic agent status: Secondary | ICD-10-CM | POA: Diagnosis not present

## 2021-04-02 DIAGNOSIS — I10 Essential (primary) hypertension: Secondary | ICD-10-CM | POA: Insufficient documentation

## 2021-04-02 DIAGNOSIS — Z170 Estrogen receptor positive status [ER+]: Secondary | ICD-10-CM | POA: Insufficient documentation

## 2021-04-02 DIAGNOSIS — F332 Major depressive disorder, recurrent severe without psychotic features: Secondary | ICD-10-CM | POA: Diagnosis not present

## 2021-04-02 DIAGNOSIS — I358 Other nonrheumatic aortic valve disorders: Secondary | ICD-10-CM | POA: Diagnosis not present

## 2021-04-02 DIAGNOSIS — Z0189 Encounter for other specified special examinations: Secondary | ICD-10-CM

## 2021-04-02 DIAGNOSIS — Z01818 Encounter for other preprocedural examination: Secondary | ICD-10-CM | POA: Insufficient documentation

## 2021-04-02 DIAGNOSIS — Z95828 Presence of other vascular implants and grafts: Secondary | ICD-10-CM

## 2021-04-02 HISTORY — DX: Presence of other vascular implants and grafts: Z95.828

## 2021-04-02 HISTORY — PX: PORTACATH PLACEMENT: SHX2246

## 2021-04-02 LAB — ECHOCARDIOGRAM COMPLETE
AR max vel: 1.63 cm2
AV Area VTI: 1.7 cm2
AV Area mean vel: 1.64 cm2
AV Mean grad: 7 mmHg
AV Peak grad: 11.7 mmHg
Ao pk vel: 1.71 m/s
Area-P 1/2: 3.93 cm2
Calc EF: 58.6 %
S' Lateral: 2.3 cm
Single Plane A2C EF: 59.4 %
Single Plane A4C EF: 60 %

## 2021-04-02 LAB — POCT PREGNANCY, URINE: Preg Test, Ur: NEGATIVE

## 2021-04-02 SURGERY — INSERTION, TUNNELED CENTRAL VENOUS DEVICE, WITH PORT
Anesthesia: General | Site: Chest | Laterality: Right

## 2021-04-02 MED ORDER — HYDROMORPHONE HCL 1 MG/ML IJ SOLN: 0.2500 mg | INTRAMUSCULAR | Status: DC | PRN

## 2021-04-02 MED ORDER — LACTATED RINGERS IV SOLN: INTRAVENOUS | Status: DC

## 2021-04-02 MED ORDER — PHENYLEPHRINE 40 MCG/ML (10ML) SYRINGE FOR IV PUSH (FOR BLOOD PRESSURE SUPPORT)
PREFILLED_SYRINGE | INTRAVENOUS | Status: AC
  Filled 2021-04-02: qty 10

## 2021-04-02 MED ORDER — CEFAZOLIN SODIUM-DEXTROSE 2-4 GM/100ML-% IV SOLN
2.0000 g | INTRAVENOUS | Status: AC
  Administered 2021-04-02: 2 g via INTRAVENOUS

## 2021-04-02 MED ORDER — PROMETHAZINE HCL 25 MG/ML IJ SOLN: 6.2500 mg | INTRAMUSCULAR | Status: DC | PRN

## 2021-04-02 MED ORDER — ONDANSETRON HCL 4 MG/2ML IJ SOLN
INTRAMUSCULAR | Status: DC | PRN
  Administered 2021-04-02: 4 mg via INTRAVENOUS

## 2021-04-02 MED ORDER — ACETAMINOPHEN 500 MG PO TABS
1000.0000 mg | ORAL_TABLET | ORAL | Status: AC
  Administered 2021-04-02: 1000 mg via ORAL

## 2021-04-02 MED ORDER — PROPOFOL 10 MG/ML IV BOLUS
INTRAVENOUS | Status: DC | PRN
Start: 2021-04-02 — End: 2021-04-02
  Administered 2021-04-02: 200 mg via INTRAVENOUS

## 2021-04-02 MED ORDER — KETOROLAC TROMETHAMINE 15 MG/ML IJ SOLN
15.0000 mg | INTRAMUSCULAR | Status: AC
  Administered 2021-04-02: 15 mg via INTRAVENOUS

## 2021-04-02 MED ORDER — KETOROLAC TROMETHAMINE 15 MG/ML IJ SOLN
INTRAMUSCULAR | Status: AC
  Filled 2021-04-02: qty 1

## 2021-04-02 MED ORDER — PHENYLEPHRINE 40 MCG/ML (10ML) SYRINGE FOR IV PUSH (FOR BLOOD PRESSURE SUPPORT)
PREFILLED_SYRINGE | INTRAVENOUS | Status: DC | PRN
  Administered 2021-04-02 (×5): 80 ug via INTRAVENOUS

## 2021-04-02 MED ORDER — PROPOFOL 10 MG/ML IV BOLUS
INTRAVENOUS | Status: AC
  Filled 2021-04-02: qty 20

## 2021-04-02 MED ORDER — LIDOCAINE 2% (20 MG/ML) 5 ML SYRINGE
INTRAMUSCULAR | Status: DC | PRN
  Administered 2021-04-02: 60 mg via INTRAVENOUS

## 2021-04-02 MED ORDER — MEPERIDINE HCL 25 MG/ML IJ SOLN: 6.2500 mg | INTRAMUSCULAR | Status: DC | PRN

## 2021-04-02 MED ORDER — ENSURE PRE-SURGERY PO LIQD: 296.0000 mL | Freq: Once | ORAL | Status: DC

## 2021-04-02 MED ORDER — BUPIVACAINE HCL (PF) 0.25 % IJ SOLN
INTRAMUSCULAR | Status: DC | PRN
  Administered 2021-04-02: 6 mL

## 2021-04-02 MED ORDER — OXYCODONE HCL 5 MG/5ML PO SOLN
5.0000 mg | Freq: Once | ORAL | Status: DC | PRN
Start: 2021-04-02 — End: 2021-04-02

## 2021-04-02 MED ORDER — ONDANSETRON HCL 4 MG/2ML IJ SOLN
INTRAMUSCULAR | Status: AC
  Filled 2021-04-02: qty 2

## 2021-04-02 MED ORDER — OXYCODONE HCL 5 MG PO TABS: 5.0000 mg | ORAL_TABLET | Freq: Once | ORAL | Status: DC | PRN

## 2021-04-02 MED ORDER — TRAMADOL HCL 50 MG PO TABS: 100.0000 mg | ORAL_TABLET | Freq: Four times a day (QID) | ORAL | 0 refills | Status: DC | PRN

## 2021-04-02 MED ORDER — MIDAZOLAM HCL 2 MG/2ML IJ SOLN
INTRAMUSCULAR | Status: AC
  Filled 2021-04-02: qty 2

## 2021-04-02 MED ORDER — DEXAMETHASONE SODIUM PHOSPHATE 10 MG/ML IJ SOLN
INTRAMUSCULAR | Status: DC | PRN
  Administered 2021-04-02: 10 mg via INTRAVENOUS

## 2021-04-02 MED ORDER — MIDAZOLAM HCL 5 MG/5ML IJ SOLN
INTRAMUSCULAR | Status: DC | PRN
  Administered 2021-04-02 (×2): 1 mg via INTRAVENOUS

## 2021-04-02 MED ORDER — ACETAMINOPHEN 500 MG PO TABS
ORAL_TABLET | ORAL | Status: AC
  Filled 2021-04-02: qty 2

## 2021-04-02 MED ORDER — HEPARIN (PORCINE) IN NACL 2-0.9 UNITS/ML
INTRAMUSCULAR | Status: AC | PRN
  Administered 2021-04-02: 1

## 2021-04-02 MED ORDER — HEPARIN SOD (PORK) LOCK FLUSH 100 UNIT/ML IV SOLN
INTRAVENOUS | Status: DC | PRN
  Administered 2021-04-02: 500 [IU] via INTRAVENOUS

## 2021-04-02 MED ORDER — CEFAZOLIN SODIUM-DEXTROSE 2-4 GM/100ML-% IV SOLN
INTRAVENOUS | Status: AC
  Filled 2021-04-02: qty 100

## 2021-04-02 MED ORDER — AMISULPRIDE (ANTIEMETIC) 5 MG/2ML IV SOLN
10.0000 mg | Freq: Once | INTRAVENOUS | Status: DC | PRN
Start: 2021-04-02 — End: 2021-04-02

## 2021-04-02 MED ORDER — FENTANYL CITRATE (PF) 100 MCG/2ML IJ SOLN
INTRAMUSCULAR | Status: DC | PRN
  Administered 2021-04-02 (×4): 25 ug via INTRAVENOUS

## 2021-04-02 MED ORDER — FENTANYL CITRATE (PF) 100 MCG/2ML IJ SOLN
INTRAMUSCULAR | Status: AC
  Filled 2021-04-02: qty 2

## 2021-04-02 SURGICAL SUPPLY — 56 items
ADH SKN CLS APL DERMABOND .7 (GAUZE/BANDAGES/DRESSINGS) ×1
APL PRP STRL LF DISP 70% ISPRP (MISCELLANEOUS) ×1
APL SKNCLS STERI-STRIP NONHPOA (GAUZE/BANDAGES/DRESSINGS) ×1
BAG DECANTER FOR FLEXI CONT (MISCELLANEOUS) ×2 IMPLANT
BENZOIN TINCTURE PRP APPL 2/3 (GAUZE/BANDAGES/DRESSINGS) ×2 IMPLANT
BLADE SURG 11 STRL SS (BLADE) ×2 IMPLANT
BLADE SURG 15 STRL LF DISP TIS (BLADE) ×1 IMPLANT
BLADE SURG 15 STRL SS (BLADE) ×2
CANISTER SUCT 1200ML W/VALVE (MISCELLANEOUS) IMPLANT
CHLORAPREP W/TINT 26 (MISCELLANEOUS) ×2 IMPLANT
COVER BACK TABLE 60X90IN (DRAPES) ×2 IMPLANT
COVER MAYO STAND STRL (DRAPES) ×2 IMPLANT
COVER PROBE 5X48 (MISCELLANEOUS) ×2
DECANTER SPIKE VIAL GLASS SM (MISCELLANEOUS) IMPLANT
DERMABOND ADVANCED (GAUZE/BANDAGES/DRESSINGS) ×1
DERMABOND ADVANCED .7 DNX12 (GAUZE/BANDAGES/DRESSINGS) ×1 IMPLANT
DRAPE C-ARM 42X72 X-RAY (DRAPES) ×2 IMPLANT
DRAPE LAPAROSCOPIC ABDOMINAL (DRAPES) ×2 IMPLANT
DRAPE UTILITY XL STRL (DRAPES) ×2 IMPLANT
DRSG TEGADERM 4X4.75 (GAUZE/BANDAGES/DRESSINGS) ×2 IMPLANT
ELECT COATED BLADE 2.86 ST (ELECTRODE) ×2 IMPLANT
ELECT REM PT RETURN 9FT ADLT (ELECTROSURGICAL) ×2
ELECTRODE REM PT RTRN 9FT ADLT (ELECTROSURGICAL) ×1 IMPLANT
GAUZE 4X4 16PLY ~~LOC~~+RFID DBL (SPONGE) ×1 IMPLANT
GAUZE SPONGE 4X4 12PLY STRL LF (GAUZE/BANDAGES/DRESSINGS) ×3 IMPLANT
GLOVE SURG ENC MOIS LTX SZ7 (GLOVE) ×3 IMPLANT
GLOVE SURG POLYISO LF SZ6 (GLOVE) ×1 IMPLANT
GLOVE SURG POLYISO LF SZ6.5 (GLOVE) ×1 IMPLANT
GLOVE SURG UNDER POLY LF SZ6.5 (GLOVE) ×1 IMPLANT
GLOVE SURG UNDER POLY LF SZ7 (GLOVE) ×2 IMPLANT
GLOVE SURG UNDER POLY LF SZ7.5 (GLOVE) ×2 IMPLANT
GOWN STRL REUS W/ TWL LRG LVL3 (GOWN DISPOSABLE) ×2 IMPLANT
GOWN STRL REUS W/TWL LRG LVL3 (GOWN DISPOSABLE) ×8
IV KIT MINILOC 20X1 SAFETY (NEEDLE) IMPLANT
KIT CVR 48X5XPRB PLUP LF (MISCELLANEOUS) IMPLANT
KIT PORT POWER 8FR ISP CVUE (Port) ×1 IMPLANT
NDL HYPO 25X1 1.5 SAFETY (NEEDLE) ×1 IMPLANT
NDL SAFETY ECLIPSE 18X1.5 (NEEDLE) IMPLANT
NEEDLE HYPO 18GX1.5 SHARP (NEEDLE)
NEEDLE HYPO 25X1 1.5 SAFETY (NEEDLE) ×2 IMPLANT
PACK BASIN DAY SURGERY FS (CUSTOM PROCEDURE TRAY) ×2 IMPLANT
PENCIL SMOKE EVACUATOR (MISCELLANEOUS) ×2 IMPLANT
SLEEVE SCD COMPRESS KNEE MED (STOCKING) ×2 IMPLANT
SPONGE T-LAP 4X18 ~~LOC~~+RFID (SPONGE) ×1 IMPLANT
STRIP CLOSURE SKIN 1/2X4 (GAUZE/BANDAGES/DRESSINGS) ×2 IMPLANT
SUT MNCRL AB 4-0 PS2 18 (SUTURE) ×2 IMPLANT
SUT PROLENE 2 0 SH DA (SUTURE) ×2 IMPLANT
SUT SILK 2 0 TIES 17X18 (SUTURE)
SUT SILK 2-0 18XBRD TIE BLK (SUTURE) IMPLANT
SUT VIC AB 3-0 SH 27 (SUTURE) ×2
SUT VIC AB 3-0 SH 27X BRD (SUTURE) ×1 IMPLANT
SYR 5ML LUER SLIP (SYRINGE) ×2 IMPLANT
SYR CONTROL 10ML LL (SYRINGE) ×2 IMPLANT
TOWEL GREEN STERILE FF (TOWEL DISPOSABLE) ×2 IMPLANT
TUBE CONNECTING 20X1/4 (TUBING) IMPLANT
YANKAUER SUCT BULB TIP NO VENT (SUCTIONS) IMPLANT

## 2021-04-02 NOTE — Interval H&P Note (Signed)
History and Physical Interval Note:  04/02/2021 10:13 AM  Joanna Osborn  has presented today for surgery, with the diagnosis of BREAST CANCER.  The various methods of treatment have been discussed with the patient and family. After consideration of risks, benefits and other options for treatment, the patient has consented to  Procedure(s): INSERTION PORT-A-CATH (N/A) as a surgical intervention.  The patient's history has been reviewed, patient examined, no change in status, stable for surgery.  I have reviewed the patient's chart and labs.  Questions were answered to the patient's satisfaction.     Rolm Bookbinder

## 2021-04-02 NOTE — Discharge Instructions (Addendum)
PORT-A-CATH: POST OP INSTRUCTIONS  Always review your discharge instruction sheet given to you by the facility where your surgery was performed.   A prescription for pain medication may be given to you upon discharge. Take your pain medication as prescribed, if needed. If narcotic pain medicine is not needed, then you make take acetaminophen (Tylenol) or ibuprofen (Advil) as needed.  Take your usually prescribed medications unless otherwise directed. If you need a refill on your pain medication, please contact our office. All narcotic pain medicine now requires a paper prescription.  Phoned in and fax refills are no longer allowed by law.  Prescriptions will not be filled after 5 pm or on weekends.  You should follow a light diet for the remainder of the day after your procedure. Most patients will experience some mild swelling and/or bruising in the area of the incision. It may take several days to resolve. It is common to experience some constipation if taking pain medication after surgery. Increasing fluid intake and taking a stool softener (such as Colace) will usually help or prevent this problem from occurring. A mild laxative (Milk of Magnesia or Miralax) should be taken according to package directions if there are no bowel movements after 48 hours.  Unless discharge instructions indicate otherwise, you may remove your bandages 48 hours after surgery, and you may shower at that time. You may have steri-strips (small white skin tapes) in place directly over the incision.  These strips should be left on the skin for 7-10 days.  If your surgeon used Dermabond (skin glue) on the incision, you may shower in 24 hours.  The glue will flake off over the next 2-3 weeks.  If your port is left accessed at the end of surgery (needle left in port), the dressing cannot get wet and should only by changed by a healthcare professional. When the port is no longer accessed (when the needle has been removed),  follow step 7.   ACTIVITIES:  Limit activity involving your arms for the next 72 hours. Do no strenuous exercise or activity for 1 week. You may drive when you are no longer taking prescription pain medication, you can comfortably wear a seatbelt, and you can maneuver your car. 10.You may need to see your doctor in the office for a follow-up appointment.  Please       check with your doctor.  11.When you receive a new Port-a-Cath, you will get a product guide and        ID card.  Please keep them in case you need them.  WHEN TO CALL YOUR DOCTOR [phone]): Fever over 101.0 Chills Continued bleeding from incision Increased redness and tenderness at the site Shortness of breath, difficulty breathing   The clinic staff is available to answer your questions during regular business hours. Please don't hesitate to call and ask to speak to one of the nurses or medical assistants for clinical concerns. If you have a medical emergency, go to the nearest emergency room or call 911.  A surgeon from [facility] is always on call at the hospital.     For further information, please visit [email or web address]    Post Anesthesia Home Care Instructions  Activity: Get plenty of rest for the remainder of the day. A responsible individual must stay with you for 24 hours following the procedure.  For the next 24 hours, DO NOT: -Drive a car -Paediatric nurse -Drink alcoholic beverages -Take any medication unless instructed by your physician -  Make any legal decisions or sign important papers.  Meals: Start with liquid foods such as gelatin or soup. Progress to regular foods as tolerated. Avoid greasy, spicy, heavy foods. If nausea and/or vomiting occur, drink only clear liquids until the nausea and/or vomiting subsides. Call your physician if vomiting continues.  Special Instructions/Symptoms: Your throat may feel dry or sore from the anesthesia or the breathing tube  placed in your throat during surgery. If this causes discomfort, gargle with warm salt water. The discomfort should disappear within 24 hours.  If you had a scopolamine patch placed behind your ear for the management of post- operative nausea and/or vomiting:  1. The medication in the patch is effective for 72 hours, after which it should be removed.  Wrap patch in a tissue and discard in the trash. Wash hands thoroughly with soap and water. 2. You may remove the patch earlier than 72 hours if you experience unpleasant side effects which may include dry mouth, dizziness or visual disturbances. 3. Avoid touching the patch. Wash your hands with soap and water after contact with the patch.  No tylenol or ibuprofen until after 4pm today if needed.

## 2021-04-02 NOTE — Anesthesia Postprocedure Evaluation (Signed)
Anesthesia Post Note  Patient: Joanna Osborn  Procedure(s) Performed: INSERTION PORT-A-CATH (Right: Chest)     Patient location during evaluation: PACU Anesthesia Type: General Level of consciousness: awake and alert Pain management: pain level controlled Vital Signs Assessment: post-procedure vital signs reviewed and stable Respiratory status: spontaneous breathing, nonlabored ventilation and respiratory function stable Cardiovascular status: blood pressure returned to baseline and stable Postop Assessment: no apparent nausea or vomiting Anesthetic complications: no   No notable events documented.  Last Vitals:  Vitals:   04/02/21 1300 04/02/21 1315  BP: 129/86 130/85  Pulse: 77 80  Resp: 16 17  Temp:    SpO2: 95% 95%    Last Pain:  Vitals:   04/02/21 1300  TempSrc:   PainSc: 0-No pain                 Lynda Rainwater

## 2021-04-02 NOTE — Op Note (Signed)
Preoperative diagnosis: Left breast cancer Postoperative diagnosis: Same as above Procedure:  Right IJ  port placement Surgeon: Dr. Serita Grammes Anesthesia: General  Estimated blood loss: minimal Specimens:none Sponge and needle count was correct at completion Drains: None Disposition recovery stable condition   Indications:  40 yof referred by Dr Julien Girt for new left breast cancer. She has no prior breast history. She has no discharge. She noted a mass in her left breast. This was evaluated with mm and [place]. She was found to have a 2.1x2.1x1.9 cm mass with a negative ax u/s. Density is c. Biopsy is grade III IDC that is 30% er pos, 15% pr pos, her 2 pos and Ki 40%.she was seen at [facility] and we elected to proceed with primary systemic therapy.     Procedure: After informed consent was obtained she was taken to the OR. She was given antibiotics.  SCDs were placed.  She was placed under general anesthesia without complication.  She was prepped and draped in the standard sterile surgical fashion.  A surgical timeout was then performed.   I used the ultrasound to identify the right IJ. I then accessed this on the first pass.  I placed the wire and this was in good position by fluoroscopy and in the vein by ultrasound.   I then made an incision on her right chest and created a pocket.  I tunneled the line between the 2 sites.  I then placed the dilator over the wire.  I observed this with fluoroscopy to go in the correct position.  I then removed the wire.  I then passed the line.  The peel-away sheath was removed.  I pulled the line back to be in the superior vena cava. The tip of the line is in the superior vena cava near the cavoatrial junction. I then attached the port.  I sutured this into place with 2-0 Prolene.  I then closed this with 3-0 Vicryl and 4-0 Monocryl.  Glue was placed.  Final fluoroscopic image showed the port to be in good position.  I then accessed the port and was able to aspirate blood  and packed this with heparin. I placed a dressing and left accessed to begin chemotherapy.  She tolerated well, was transferred to recovery stable.

## 2021-04-02 NOTE — Transfer of Care (Signed)
Immediate Anesthesia Transfer of Care Note  Patient: Joanna Osborn  Procedure(s) Performed: INSERTION PORT-A-CATH (Right: Chest)  Patient Location: PACU  Anesthesia Type:General  Level of Consciousness: drowsy, patient cooperative and responds to stimulation  Airway & Oxygen Therapy: Patient Spontanous Breathing and Patient connected to face mask oxygen  Post-op Assessment: Report given to RN and Post -op Vital signs reviewed and stable  Post vital signs: Reviewed and stable  Last Vitals:  Vitals Value Taken Time  BP 122/82 04/02/21 1222  Temp    Pulse 88 04/02/21 1224  Resp 24 04/02/21 1224  SpO2 98 % 04/02/21 1224  Vitals shown include unvalidated device data.  Last Pain:  Vitals:   04/02/21 0951  TempSrc: Oral  PainSc: 0-No pain      Patients Stated Pain Goal: 4 ([id number])  Complications: No notable events documented.

## 2021-04-02 NOTE — Anesthesia Procedure Notes (Signed)
Procedure Name: LMA Insertion Date/Time: 04/02/2021 11:29 AM Performed by: Janene Harvey, CRNA Pre-anesthesia Checklist: Patient identified, Emergency Drugs available, Suction available and Patient being monitored Patient Re-evaluated:Patient Re-evaluated prior to induction Oxygen Delivery Method: Circle system utilized Preoxygenation: Pre-oxygenation with 100% oxygen Induction Type: IV induction LMA: LMA flexible inserted LMA Size: 4.0 Placement Confirmation: positive ETCO2 Dental Injury: Teeth and Oropharynx as per pre-operative assessment

## 2021-04-02 NOTE — H&P (Signed)
49 yof referred by Dr Adkins for new left breast cancer. She has no prior breast history. She has no discharge. She noted a mass in her left breast. This was evaluated with mm and us. She was found to have a 2.1x2.1x1.9 cm mass with a negative ax u/s. Density is c. 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   Review of Systems: A complete review of systems was obtained from the patient. I have reviewed this information and discussed as appropriate with the patient. See HPI as well for other ROS.  Review of Systems  Constitutional: Positive for malaise/fatigue.  All other systems reviewed and are negative.   Medical History: Past Medical History:  Diagnosis Date   Anxiety   Depression   History of kidney stones   Hypertension   Patient Active Problem List  Diagnosis   Malignant neoplasm of upper-outer quadrant of left breast in female, estrogen receptor positive (CMS-HCC)   Past Surgical History:  Procedure Laterality Date   CHOLECYSTECTOMY N/A  2002   LAPAROSCOPIC TUBAL LIGATION N/A  1999   Ventral Hernia Repair; insertion of mesh N/A 10/28/2016  by Dr Chelsea Connor   Wrist Surgery N/A  Date Unknown    Allergies  Allergen Reactions   Oxycodone Itching   Current Outpatient Medications on File Prior to Visit  Medication Sig Dispense Refill   fluticasone propionate (FLONASE) 50 mcg/actuation nasal spray fluticasone propionate 50 mcg/actuation nasal spray,suspension   hydroCHLOROthiazide (HYDRODIURIL) 25 MG tablet hydrochlorothiazide 25 mg tablet   ibuprofen (MOTRIN) 800 MG tablet ibuprofen 800 mg tablet   No current facility-administered medications on file prior to visit.   Family History  Problem Relation Age of Onset   High blood pressure (Hypertension) Mother   Diabetes Mother   Deep vein thrombosis (DVT or abnormal blood clot formation) Mother   Coronary Artery Disease (Blocked arteries around heart) Father   High blood  pressure (Hypertension) Father   Cancer Other   High blood pressure (Hypertension) Other    Social History   Tobacco Use  Smoking Status Never Smoker  Smokeless Tobacco Never Used    Social History   Socioeconomic History   Marital status: Married  Tobacco Use   Smoking status: Never Smoker   Smokeless tobacco: Never Used  Vaping Use   Vaping Use: Never used  Substance and Sexual Activity   Alcohol use: Yes  Comment: 1-2 a month   Drug use: Never   Sexual activity: Defer   Objective:   There were no vitals filed for this visit.  There is no height or weight on file to calculate BMI.  Physical Exam Constitutional:  Appearance: Normal appearance.  Cardiovascular:  Rate and Rhythm: Normal rate.  Pulmonary:  Effort: Pulmonary effort is normal.  Chest:  Breasts:  Right: No mass or nipple discharge.  Left: Mass present. No nipple discharge.   Comments: 2.5 x2.5 cm superior breast mass Lymphadenopathy:  Upper Body:  Right upper body: No supraclavicular or axillary adenopathy.  Left upper body: No supraclavicular or axillary adenopathy.  Skin: General: Skin is warm and dry.  Neurological:  Mental Status: She is alert.  Psychiatric:  Mood and Affect: Mood normal.  Behavior: Behavior normal.     Assessment and Plan:   Malignant neoplasm of upper-outer quadrant of left breast in female, estrogen receptor positive (CMS-HCC)  Port placement for primary chemotherapy, genetics, MRI  We discussed the staging and pathophysiology   of breast cancer. We discussed all of the different options for treatment for breast cancer including surgery, chemotherapy, radiation therapy, Herceptin, and antiestrogen therapy. Due to T2 tumor and Her2 positivity we have elected to proceed with primary systemic therapy. Discussed right ij port placement with her and will schedule We discussed a sentinel lymph node biopsy at the time of surgery. We discussed the performance of that with  injection of tracer. We discussed that there is a chance of having a positive node with a sentinel lymph node biopsy and we will await the permanent pathology to make any other first further decisions in terms of her treatment. We discussed up to a 5% risk lifetime of chronic shoulder pain as well as lymphedema associated with a sentinel lymph node biopsy. We discussed the options for treatment of the breast cancer which included lumpectomy versus a mastectomy. We discussed the performance of the lumpectomy with radioactive seed placement. We discussed a 5-10% chance of a positive margin requiring reexcision in the operating room. We also discussed that she will need radiation therapy if she undergoes lumpectomy. We discussed mastectomy and the postoperative care for that as well. Mastectomy can be followed by reconstruction. The decision for lumpectomy vs mastectomy has no impact on decision for chemotherapy. Most mastectomy patients will not need radiation therapy. We discussed that there is no difference in her survival whether she undergoes lumpectomy with radiation therapy or antiestrogen therapy versus a mastectomy. There is also no real difference between her recurrence in the breast. We will make final decision on surgery once chemo is done and I reassess as well as having genetics back.

## 2021-04-02 NOTE — Progress Notes (Signed)
Called pt to introduce myself as her Financial Resource Specialist and to discuss the [employer] grant.  Pt has 2 insurances so copay assistance shouldn't be needed.  I left a msg requesting she return my call if she's interested in applying for the grant.  

## 2021-04-02 NOTE — Anesthesia Preprocedure Evaluation (Signed)
Anesthesia Evaluation  Patient identified by MRN, date of birth, ID band Patient awake    Reviewed: Allergy & Precautions, NPO status , Patient's Chart, lab work & pertinent test results  Airway Mallampati: II  TM Distance: >3 FB Neck ROM: Full    Dental  (+) Teeth Intact, Dental Advisory Given   Pulmonary neg pulmonary ROS,    Pulmonary exam normal breath sounds clear to auscultation       Cardiovascular hypertension, Pt. on medications negative cardio ROS Normal cardiovascular exam Rhythm:Regular Rate:Normal     Neuro/Psych PSYCHIATRIC DISORDERS Anxiety Depression negative neurological ROS     GI/Hepatic negative GI ROS, Neg liver ROS,   Endo/Other  negative endocrine ROSObesity   Renal/GU negative Renal ROS     Musculoskeletal negative musculoskeletal ROS (+)   Abdominal (+) + obese,   Peds  Hematology negative hematology ROS (+)   Anesthesia Other Findings Day of surgery medications reviewed with the patient.  Breast Cancer  Reproductive/Obstetrics negative OB ROS                             Anesthesia Physical  Anesthesia Plan  ASA: 3  Anesthesia Plan: General   Post-op Pain Management:    Induction: Intravenous  PONV Risk Score and Plan: 3 and Ondansetron, Dexamethasone, Midazolam and Treatment may vary due to age or medical condition  Airway Management Planned: LMA  Additional Equipment:   Intra-op Plan:   Post-operative Plan: Extubation in OR  Informed Consent: I have reviewed the patients History and Physical, chart, labs and discussed the procedure including the risks, benefits and alternatives for the proposed anesthesia with the patient or authorized representative who has indicated his/her understanding and acceptance.     Dental advisory given  Plan Discussed with: CRNA  Anesthesia Plan Comments: (Risks/benefits of general anesthesia discussed with  patient including risk of damage to teeth, lips, gum, and tongue, nausea/vomiting, allergic reactions to medications, and the possibility of heart attack, stroke and death.  All patient questions answered.  Patient wishes to proceed.)        Anesthesia Quick Evaluation

## 2021-04-03 ENCOUNTER — Telehealth: Payer: Self-pay | Admitting: *Deleted

## 2021-04-03 ENCOUNTER — Inpatient Hospital Stay: Payer: Federal, State, Local not specified - PPO | Attending: Hematology

## 2021-04-03 ENCOUNTER — Encounter: Payer: Self-pay | Admitting: Hematology

## 2021-04-03 ENCOUNTER — Inpatient Hospital Stay (HOSPITAL_BASED_OUTPATIENT_CLINIC_OR_DEPARTMENT_OTHER): Payer: Federal, State, Local not specified - PPO | Admitting: Hematology

## 2021-04-03 VITALS — BP 147/82 | HR 79 | Temp 98.2°F | Resp 19 | Ht 65.0 in | Wt 228.2 lb

## 2021-04-03 DIAGNOSIS — Z5111 Encounter for antineoplastic chemotherapy: Secondary | ICD-10-CM | POA: Insufficient documentation

## 2021-04-03 DIAGNOSIS — Z5189 Encounter for other specified aftercare: Secondary | ICD-10-CM | POA: Diagnosis not present

## 2021-04-03 DIAGNOSIS — R110 Nausea: Secondary | ICD-10-CM | POA: Insufficient documentation

## 2021-04-03 DIAGNOSIS — Z95828 Presence of other vascular implants and grafts: Secondary | ICD-10-CM | POA: Insufficient documentation

## 2021-04-03 DIAGNOSIS — Z170 Estrogen receptor positive status [ER+]: Secondary | ICD-10-CM

## 2021-04-03 DIAGNOSIS — C50412 Malignant neoplasm of upper-outer quadrant of left female breast: Secondary | ICD-10-CM

## 2021-04-03 DIAGNOSIS — Z5112 Encounter for antineoplastic immunotherapy: Secondary | ICD-10-CM | POA: Diagnosis not present

## 2021-04-03 LAB — CMP (CANCER CENTER ONLY)
ALT: 56 U/L — ABNORMAL HIGH (ref 0–44)
AST: 37 U/L (ref 15–41)
Albumin: 3.8 g/dL (ref 3.5–5.0)
Alkaline Phosphatase: 56 U/L (ref 38–126)
Anion gap: 11 (ref 5–15)
BUN: 14 mg/dL (ref 6–20)
CO2: 19 mmol/L — ABNORMAL LOW (ref 22–32)
Calcium: 9.6 mg/dL (ref 8.9–10.3)
Chloride: 112 mmol/L — ABNORMAL HIGH (ref 98–111)
Creatinine: 0.8 mg/dL (ref 0.44–1.00)
GFR, Estimated: >60 mL/min (ref >60)
Glucose, Bld: 102 mg/dL — ABNORMAL HIGH (ref 70–99)
Potassium: 4 mmol/L (ref 3.5–5.1)
Sodium: 142 mmol/L (ref 135–145)
Total Bilirubin: 0.6 mg/dL (ref 0.3–1.2)
Total Protein: 7 g/dL (ref 6.5–8.1)

## 2021-04-03 LAB — CBC WITH DIFFERENTIAL (CANCER CENTER ONLY)
Abs Immature Granulocytes: 0.05 10*3/uL (ref 0.00–0.07)
Basophils Absolute: 0 10*3/uL (ref 0.0–0.1)
Basophils Relative: 0 %
Eosinophils Absolute: 0 10*3/uL (ref 0.0–0.5)
Eosinophils Relative: 0 %
HCT: 33.1 % — ABNORMAL LOW (ref 36.0–46.0)
Hemoglobin: 10.5 g/dL — ABNORMAL LOW (ref 12.0–15.0)
Immature Granulocytes: 0 %
Lymphocytes Relative: 17 %
Lymphs Abs: 2.5 10*3/uL (ref 0.7–4.0)
MCH: 25.8 pg — ABNORMAL LOW (ref 26.0–34.0)
MCHC: 31.7 g/dL (ref 30.0–36.0)
MCV: 81.3 fL (ref 80.0–100.0)
Monocytes Absolute: 0.7 10*3/uL (ref 0.1–1.0)
Monocytes Relative: 5 %
Neutro Abs: 11.3 10*3/uL — ABNORMAL HIGH (ref 1.7–7.7)
Neutrophils Relative %: 78 %
Platelet Count: 482 10*3/uL — ABNORMAL HIGH (ref 150–400)
RBC: 4.07 MIL/uL (ref 3.87–5.11)
RDW: 15.4 % (ref 11.5–15.5)
WBC Count: 14.6 10*3/uL — ABNORMAL HIGH (ref 4.0–10.5)
nRBC: 0 % (ref 0.0–0.2)

## 2021-04-03 MED ORDER — SODIUM CHLORIDE 0.9% FLUSH
10.0000 mL | Freq: Once | INTRAVENOUS | Status: AC
  Administered 2021-04-03: 10 mL

## 2021-04-03 MED FILL — Dexamethasone Sodium Phosphate Inj 100 MG/10ML: INTRAMUSCULAR | Qty: 1 | Status: AC

## 2021-04-03 MED FILL — Fosaprepitant Dimeglumine For IV Infusion 150 MG (Base Eq): INTRAVENOUS | Qty: 5 | Status: AC

## 2021-04-03 NOTE — Telephone Encounter (Signed)
Pt called requesting injection appt for 9/12 to be after 12pm d/t transportation needs as well as moving her appts/infusions to Mondays after her 1st chemo. Informed pt request sent to scheduling for appt changes.

## 2021-04-03 NOTE — Progress Notes (Signed)
[facility]   Telephone:([phone]) [phone] Fax:([phone]) [phone]   Clinic Follow up Note   Patient Care Team: Lennie Odor, [place] as PCP - General (Physician Assistant) Rockwell Germany, RN as Oncology Nurse Navigator Mauro Kaufmann, RN as Oncology Nurse Navigator Rolm Bookbinder, MD as Consulting Physician (General Surgery) Truitt Merle, MD as Consulting Physician (Hematology) Kyung Rudd, MD as Consulting Physician (Radiation Oncology)  Date of Service:  04/03/2021  CHIEF COMPLAINT: f/u of left breast cancer  CURRENT THERAPY:  Neoadjuvant TCHP with GCSF, starting 04/04/21  ASSESSMENT & PLAN:  Joanna Osborn is a 50 y.o. female with   1. 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 Left mammogram on 03/05/21 showed 2.1 cm mass at 12 o'clock. Biopsy confirmed invasive ductal carcinoma, grade 3, weakly ER+ and Her2+ -Given her Her2+ disease, I recommend neoadjuvant TCHP every 3 weeks for 6 cycles, followed by Herceptin w or wo [place] maintenance therapy for a total of one year.  I reminded her to continue to keep up her fluid intake and watch her blood pressure.  -Breast MRI 03/26/21 showed additional areas of concern-- a left breast/axilla lymph node, and a right breast mass. She is scheduled for second-look ultrasounds and possible biopsies on 04/14/21. If she has positive node disease, I will get staging scan  -echo reviewed, no concerns  -She had a port placed yesterday, 04/02/21, in anticipation of beginning chemo tomorrow. She will take her first dexamethasone when she gets home. -We again reviewed the potential side effect from chemotherapy and HER2 antibodies, she voiced good understanding and agreed to proceed tomorrow.   2. Anxiety and depression, HTN -f/u PCP -continue HCTZ, will monitor her blood pressure closely during chemotherapy -established connection with social  worker during clinic  3. Mild Anemia -her hgb has dropped recently, down to 10.5 today. -she still has periods, due for her next cycle next week. -I advised her to start OTC oral iron. I discussed that this can cause constipation. -I will check her iron levels at her next visit.  4. Genetic testing -performed 03/19/21. Results pending.    PLAN:  -advised her to start oral iron -return for first cycle TCHP tomorrow, 04/04/21 and with GCSF on 9/12 -labs with iron panel, f/u, and toxicity check in 1 week -will proceed with breast US and biopsies on 04/14/21   No problem-specific Assessment & Plan notes found for this encounter.   SUMMARY OF ONCOLOGIC HISTORY: Oncology History Overview Note  Cancer Staging Malignant neoplasm of upper-outer quadrant of left breast in female, estrogen receptor positive ([place]) Staging form: Breast, AJCC 8th Edition - Clinical stage from 03/07/2021: Stage IB (cT2, cN0, cM0, G3, ER+, PR+, HER2+) - Signed by Truitt Merle, MD on 03/17/2021 Stage prefix: Initial diagnosis Histologic grading system: 3 grade system    Malignant neoplasm of upper-outer quadrant of left breast in female, estrogen receptor positive ([place])  03/05/2021 Mammogram   Diagnostic Left Mammogram; Left Breast Ultrasound  IMPRESSION: At the palpable site of concern in the left breast at 12 o'clock there is a suspicious mass measuring 2.1 cm.   03/07/2021 Cancer Staging   Staging form: Breast, AJCC 8th Edition - Clinical stage from 03/07/2021: Stage IB (cT2, cN0, cM0, G3, ER+, PR+, HER2+) - Signed by Truitt Merle, MD on 03/17/2021 Stage prefix: Initial diagnosis Histologic grading system: 3 grade system   03/07/2021 Pathology Results   Diagnosis  Breast, left, needle core biopsy, left breast 12 o' clock - INVASIVE DUCTAL CARCINOMA - SEE COMMENT Based on the biopsy, the carcinoma appears Nottingham grade 3 of 3  PROGNOSTIC INDICATORS Results: IMMUNOHISTOCHEMICAL AND MORPHOMETRIC ANALYSIS  PERFORMED MANUALLY The tumor cells are POSITIVE for Her2 (3+). Estrogen Receptor: 30%, POSITIVE, WEAK STAINING INTENSITY Progesterone Receptor: 15%, POSITIVE, MODERATE STAINING INTENSITY Proliferation Marker Ki67: 40%     03/13/2021 Initial Diagnosis   Malignant neoplasm of upper-outer quadrant of left breast in female, estrogen receptor positive ([place])   03/26/2021 Imaging   MRI Breast  IMPRESSION: 1. 2.5 cm biopsy-proven malignancy within the UPPER LEFT breast. 2. Abnormal lymph node with focal cortical thickening in the posterior UPPER-OUTER LEFT breast/LOWER LEFT axilla. 2nd-look ultrasound with possible biopsy is recommended. 3. Indeterminate 0.8 cm OUTER RIGHT breast mass. 2nd-look ultrasound with possible biopsy is recommended.   04/04/2021 -  Chemotherapy    Patient is on Treatment Plan: BREAST  DOCETAXEL + CARBOPLATIN + TRASTUZUMAB + PERTUZUMAB  (TCHP) Q21D           INTERVAL HISTORY:  Joanna Osborn is here for a follow up of breast cancer. She was last seen by me on 03/19/21 in the breast clinic. She presents to the clinic accompanied by her sister Joanna Osborn. She feels ready to begin treatment tomorrow.   All other systems were reviewed with the patient and are negative.  MEDICAL HISTORY:  Past Medical History:  Diagnosis Date   Anxiety    Breast cancer ([place])    Depression    Family history of colon cancer 03/19/2021   History of kidney stones    Hypertension     SURGICAL HISTORY: Past Surgical History:  Procedure Laterality Date   CHOLECYSTECTOMY  2002   INSERTION OF MESH N/A 10/28/2016   Procedure: INSERTION OF MESH;  Surgeon: Clovis [name], MD;  Location: [facility];  Service: General;  Laterality: N/A;   [place] N/A 10/28/2016   Procedure: LAPAROSCOPIC VENTRAL HERNIA REPAIR;  Surgeon: Clovis [name], MD;  Location: [facility];  Service: General;  Laterality: N/A;   WRIST SURGERY      I have reviewed the social history and  family history with the patient and they are unchanged from previous note.  ALLERGIES:  is allergic to no known allergies.  MEDICATIONS:  Current Outpatient Medications  Medication Sig Dispense Refill   dexamethasone (DECADRON) 4 MG tablet Take 1 tablet (4 mg total) by mouth 2 (two) times daily. Start the day before Taxotere. Then take daily x 3 days after chemotherapy. 30 tablet 1   hydrochlorothiazide (HYDRODIURIL) 25 MG tablet Take 25 mg by mouth daily.     lidocaine-prilocaine (EMLA) cream Apply to affected area once 30 g 3   ondansetron (ZOFRAN) 8 MG tablet Take 1 tablet (8 mg total) by mouth 2 (two) times daily as needed (Nausea or vomiting). Start on the third day after chemotherapy. 30 tablet 1   prochlorperazine (COMPAZINE) 10 MG tablet Take 1 tablet (10 mg total) by mouth every 6 (six) hours as needed (Nausea or vomiting). 30 tablet 1   traMADol (ULTRAM) 50 MG tablet Take 2 tablets (100 mg total) by mouth every 6 (six) hours as needed. 6 tablet 0   No current facility-administered medications for this visit.    PHYSICAL EXAMINATION: ECOG PERFORMANCE STATUS: 0 - Asymptomatic  Vitals:   04/03/21 1209  BP: (!) 147/82  Pulse: 79  Resp: 19  Temp:  98.2 F (36.8 C)  SpO2: 100%   Wt Readings from Last 3 Encounters:  04/03/21 228 lb 3.2 oz (103.5 kg)  04/02/21 225 lb 5 oz (102.2 kg)  03/19/21 227 lb 1.6 oz (103 kg)     GENERAL:alert, no distress and comfortable SKIN: skin color normal, no rashes or significant lesions EYES: normal, Conjunctiva are pink and non-injected, sclera clear  NEURO: alert & oriented x 3 with fluent speech  LABORATORY DATA:  I have reviewed the data as listed CBC Latest Ref Rng & Units 04/03/2021 03/19/2021 04/29/2018  WBC 4.0 - 10.5 K/uL 14.6(H) 6.5 7.9  Hemoglobin 12.0 - 15.0 g/dL 10.5(L) 11.3(L) 13.1  Hematocrit 36.0 - 46.0 % 33.1(L) 35.6(L) 41.1  Platelets 150 - 400 K/uL 482(H) 492(H) 456(H)     CMP Latest Ref Rng & Units 04/03/2021 03/19/2021  04/29/2018  Glucose 70 - 99 mg/dL 102(H) 127(H) 99  BUN 6 - 20 mg/dL _0 Creatinine 0.44 - 1.00 mg/dL 0.80 0.83 0.60  Sodium 135 - 145 mmol/L 142 138 142  Potassium 3.5 - 5.1 mmol/L 4.0 3.6 3.9  Chloride 98 - 111 mmol/L 112(H) 103 109  CO2 22 - 32 mmol/L 19(L) 24 22  Calcium 8.9 - 10.3 mg/dL 9.6 9.2 9.8  Total Protein 6.5 - 8.1 g/dL 7.0 7.2 -  Total Bilirubin 0.3 - 1.2 mg/dL 0.6 0.6 -  Alkaline Phos 38 - 126 U/L 56 52 -  AST 15 - 41 U/L 37 21 -  ALT 0 - 44 U/L 56(H) 21 -      RADIOGRAPHIC STUDIES: I have personally reviewed the radiological images as listed and agreed with the findings in the report. DG Chest 1 View  Result Date: 04/02/2021 CLINICAL DATA:  Port placement. EXAM: CHEST  1 VIEW COMPARISON:  Chest x-ray 11/24/2013. FINDINGS: Right-sided Port-A-Cath noted with tip overlying the expected region of the distal SVC/SVC/RA junction. Prominent soft tissue fullness noted in the right hilar region. No pneumothorax or substantial pleural fluid visible on the spot fluoro image. IMPRESSION: Right-sided Port-A-Cath placement with tip overlying the distal SVC/RA junction. No evidence for pneumothorax or substantial pleural effusion on the spot fluoro films. Soft tissue prominence noted in the right hilar region, new in the interval. Dedicated chest x-ray recommended to further evaluate. Electronically Signed   By: Misty Stanley M.D.   On: 04/02/2021 12:52   DG C-Arm 1-60 Min-No Report  Result Date: 04/02/2021 Fluoroscopy was utilized by the requesting physician.  No radiographic interpretation.   ECHOCARDIOGRAM COMPLETE  Result Date: 04/02/2021    ECHOCARDIOGRAM REPORT   Patient Name:   Joanna Osborn Date of Exam: 04/02/2021 Medical Rec #:  [id number]    Height:       65.0 in Accession #:    [id number]   Weight:       227.1 lb Date of Birth:  1971/07/14   BSA:          2.088 m Patient Age:    21 years     BP:           115/75 mmHg Patient Gender: F            HR:           82 bpm. Exam  Location:  Inpatient Procedure: 2D Echo, Cardiac Doppler, Color Doppler and Strain Analysis Indications:    Chemo  History:        Patient has no prior history of Echocardiogram examinations.  Risk Factors:Hypertension.  Sonographer:    Luisa Hart RDCS Referring Phys: [id number] [place]  1. Left ventricular ejection fraction, by estimation, is 60 to 65%. The left ventricle has normal function. The left ventricle has no regional wall motion abnormalities. There is mild left ventricular hypertrophy of the basal-septal segment. Left ventricular diastolic parameters are consistent with Grade I diastolic dysfunction (impaired relaxation). The average left ventricular global longitudinal strain is -21.0 %. The global longitudinal strain is normal.  2. Right ventricular systolic function is normal. The right ventricular size is mildly enlarged. There is normal pulmonary artery systolic pressure.  3. The mitral valve is normal in structure. Trivial mitral valve regurgitation. No evidence of mitral stenosis.  4. The aortic valve is tricuspid. Aortic valve regurgitation is not visualized. Mild aortic valve sclerosis is present, with no evidence of aortic valve stenosis.  5. The inferior vena cava is normal in size with greater than 50% respiratory variability, suggesting right atrial pressure of 3 mmHg. FINDINGS  Left Ventricle: Left ventricular ejection fraction, by estimation, is 60 to 65%. The left ventricle has normal function. The left ventricle has no regional wall motion abnormalities. The average left ventricular global longitudinal strain is -21.0 %. The global longitudinal strain is normal. The left ventricular internal cavity size was normal in size. There is mild left ventricular hypertrophy of the basal-septal segment. Left ventricular diastolic parameters are consistent with Grade I diastolic dysfunction (impaired relaxation). Normal left ventricular filling pressure. Right Ventricle:  The right ventricular size is mildly enlarged. No increase in right ventricular wall thickness. Right ventricular systolic function is normal. There is normal pulmonary artery systolic pressure. The tricuspid regurgitant velocity is 2.34  m/s, and with an assumed right atrial pressure of 3 mmHg, the estimated right ventricular systolic pressure is 84.1 mmHg. Left Atrium: Left atrial size was normal in size. Right Atrium: Right atrial size was normal in size. Pericardium: There is no evidence of pericardial effusion. Mitral Valve: The mitral valve is normal in structure. Trivial mitral valve regurgitation. No evidence of mitral valve stenosis. Tricuspid Valve: The tricuspid valve is normal in structure. Tricuspid valve regurgitation is trivial. No evidence of tricuspid stenosis. Aortic Valve: The aortic valve is tricuspid. Aortic valve regurgitation is not visualized. Mild aortic valve sclerosis is present, with no evidence of aortic valve stenosis. Aortic valve mean gradient measures 7.0 mmHg. Aortic valve peak gradient measures 11.7 mmHg. Aortic valve area, by VTI measures 1.70 cm. Pulmonic Valve: The pulmonic valve was normal in structure. Pulmonic valve regurgitation is not visualized. No evidence of pulmonic stenosis. Aorta: The aortic root is normal in size and structure. Venous: The inferior vena cava is normal in size with greater than 50% respiratory variability, suggesting right atrial pressure of 3 mmHg. IAS/Shunts: No atrial level shunt detected by color flow Doppler.  LEFT VENTRICLE PLAX 2D LVIDd:         4.10 cm     Diastology LVIDs:         2.30 cm     LV e' medial:    6.31 cm/s LV PW:         0.90 cm     LV E/e' medial:  9.4 LV IVS:        1.30 cm     LV e' lateral:   10.70 cm/s LVOT diam:     1.70 cm     LV E/e' lateral: 5.5 LV SV:         55 LV  SV Index:   26          2D Longitudinal Strain LVOT Area:     2.27 cm    2D Strain GLS (A2C):   -19.5 %                            2D Strain GLS (A3C):    -21.8 %                            2D Strain GLS (A4C):   -21.7 % LV Volumes (MOD)           2D Strain GLS Avg:     -21.0 % LV vol d, MOD A2C: 71.0 ml LV vol d, MOD A4C: 77.5 ml LV vol s, MOD A2C: 28.8 ml LV vol s, MOD A4C: 31.0 ml LV SV MOD A2C:     42.2 ml LV SV MOD A4C:     77.5 ml LV SV MOD BP:      43.7 ml RIGHT VENTRICLE RV Basal diam:  4.00 cm RV Mid diam:    3.20 cm LEFT ATRIUM             Index       RIGHT ATRIUM           Index LA Vol (A2C):   83.0 ml 39.75 ml/m RA Area:     14.30 cm LA Vol (A4C):   45.9 ml 21.98 ml/m RA Volume:   34.40 ml  16.48 ml/m LA Biplane Vol: 62.7 ml 30.03 ml/m  AORTIC VALVE                    PULMONIC VALVE AV Area (Vmax):    1.63 cm     PV Vmax:       0.93 m/s AV Area (Vmean):   1.64 cm     PV Vmean:      67.900 cm/s AV Area (VTI):     1.70 cm     PV VTI:        0.186 m AV Vmax:           171.00 cm/s  PV Peak grad:  3.5 mmHg AV Vmean:          118.000 cm/s PV Mean grad:  2.0 mmHg AV VTI:            0.324 m AV Peak Grad:      11.7 mmHg AV Mean Grad:      7.0 mmHg LVOT Vmax:         123.00 cm/s LVOT Vmean:        85.200 cm/s LVOT VTI:          0.242 m LVOT/AV VTI ratio: 0.75  AORTA Ao Root diam: 3.40 cm Ao Asc diam:  2.70 cm MITRAL VALVE               TRICUSPID VALVE MV Area (PHT): 3.93 cm    TR Peak grad:   21.9 mmHg MV Decel Time: 193 msec    TR Vmax:        234.00 cm/s MV E velocity: 59.10 cm/s MV A velocity: 93.50 cm/s  SHUNTS MV E/A ratio:  0.63        Systemic VTI:  0.24 m  Systemic Diam: 1.70 cm Fransico Him MD Electronically signed by Fransico Him MD Signature Date/Time: 04/02/2021/9:28:46 AM    Final       No orders of the defined types were placed in this encounter.  All questions were answered. The patient knows to call the clinic with any problems, questions or concerns. No barriers to learning was detected. The total time spent in the appointment was 30 minutes.     Truitt Merle, MD 04/03/2021   I, Wilburn Mylar, am acting as  scribe for Truitt Merle, MD.   I have reviewed the above documentation for accuracy and completeness, and I agree with the above.

## 2021-04-03 NOTE — Progress Notes (Signed)
Pt was already accessed from yesterday after port insertion, she requested for it to stay in overnight for her infusion appt tomorrow. Dressing and biopatch applied.

## 2021-04-04 ENCOUNTER — Other Ambulatory Visit: Payer: Self-pay

## 2021-04-04 ENCOUNTER — Encounter: Payer: Self-pay | Admitting: *Deleted

## 2021-04-04 ENCOUNTER — Inpatient Hospital Stay: Payer: Federal, State, Local not specified - PPO

## 2021-04-04 VITALS — BP 138/84 | HR 75 | Temp 99.1°F | Resp 18

## 2021-04-04 DIAGNOSIS — C50412 Malignant neoplasm of upper-outer quadrant of left female breast: Secondary | ICD-10-CM

## 2021-04-04 DIAGNOSIS — Z5112 Encounter for antineoplastic immunotherapy: Secondary | ICD-10-CM | POA: Diagnosis not present

## 2021-04-04 DIAGNOSIS — R110 Nausea: Secondary | ICD-10-CM | POA: Diagnosis not present

## 2021-04-04 DIAGNOSIS — Z5189 Encounter for other specified aftercare: Secondary | ICD-10-CM | POA: Diagnosis not present

## 2021-04-04 DIAGNOSIS — Z5111 Encounter for antineoplastic chemotherapy: Secondary | ICD-10-CM | POA: Diagnosis not present

## 2021-04-04 DIAGNOSIS — Z170 Estrogen receptor positive status [ER+]: Secondary | ICD-10-CM | POA: Diagnosis not present

## 2021-04-04 MED ORDER — SODIUM CHLORIDE 0.9 % IV SOLN
150.0000 mg | Freq: Once | INTRAVENOUS | Status: AC
  Administered 2021-04-04: 150 mg via INTRAVENOUS
  Filled 2021-04-04: qty 5
  Filled 2021-04-04: qty 150

## 2021-04-04 MED ORDER — SODIUM CHLORIDE 0.9% FLUSH
10.0000 mL | INTRAVENOUS | Status: DC | PRN
  Administered 2021-04-04: 10 mL

## 2021-04-04 MED ORDER — ACETAMINOPHEN 325 MG PO TABS
650.0000 mg | ORAL_TABLET | Freq: Once | ORAL | Status: AC
  Administered 2021-04-04: 650 mg via ORAL
  Filled 2021-04-04: qty 2

## 2021-04-04 MED ORDER — SODIUM CHLORIDE 0.9 % IV SOLN
900.0000 mg | Freq: Once | INTRAVENOUS | Status: AC
  Administered 2021-04-04: 900 mg via INTRAVENOUS
  Filled 2021-04-04: qty 90

## 2021-04-04 MED ORDER — SODIUM CHLORIDE 0.9 % IV SOLN: Freq: Once | INTRAVENOUS | Status: AC

## 2021-04-04 MED ORDER — SODIUM CHLORIDE 0.9 % IV SOLN
10.0000 mg | Freq: Once | INTRAVENOUS | Status: AC
  Administered 2021-04-04: 10 mg via INTRAVENOUS
  Filled 2021-04-04: qty 1
  Filled 2021-04-04: qty 10

## 2021-04-04 MED ORDER — SODIUM CHLORIDE 0.9 % IV SOLN
840.0000 mg | Freq: Once | INTRAVENOUS | Status: AC
  Administered 2021-04-04: 840 mg via INTRAVENOUS
  Filled 2021-04-04: qty 28

## 2021-04-04 MED ORDER — DIPHENHYDRAMINE HCL 25 MG PO CAPS
50.0000 mg | ORAL_CAPSULE | Freq: Once | ORAL | Status: AC
  Administered 2021-04-04: 50 mg via ORAL
  Filled 2021-04-04: qty 2

## 2021-04-04 MED ORDER — TRASTUZUMAB-DKST CHEMO 150 MG IV SOLR
8.0000 mg/kg | Freq: Once | INTRAVENOUS | Status: AC
  Administered 2021-04-04: 819 mg via INTRAVENOUS
  Filled 2021-04-04: qty 39

## 2021-04-04 MED ORDER — PALONOSETRON HCL INJECTION 0.25 MG/5ML
0.2500 mg | Freq: Once | INTRAVENOUS | Status: AC
  Administered 2021-04-04: 0.25 mg via INTRAVENOUS
  Filled 2021-04-04: qty 5

## 2021-04-04 MED ORDER — SODIUM CHLORIDE 0.9 % IV SOLN
75.0000 mg/m2 | Freq: Once | INTRAVENOUS | Status: AC
  Administered 2021-04-04: 160 mg via INTRAVENOUS
  Filled 2021-04-04: qty 16

## 2021-04-04 MED ORDER — HEPARIN SOD (PORK) LOCK FLUSH 100 UNIT/ML IV SOLN
500.0000 [IU] | Freq: Once | INTRAVENOUS | Status: AC | PRN
  Administered 2021-04-04: 500 [IU]

## 2021-04-04 NOTE — Patient Instructions (Signed)
[facility]  Discharge Instructions: Thank you for choosing [facility] to provide your oncology and hematology care.   If you have a lab appointment with the [facility], please go directly to the [facility] and check in at the registration area.   Wear comfortable clothing and clothing appropriate for easy access to any Portacath or PICC line.   We strive to give you quality time with your provider. You may need to reschedule your appointment if you arrive late (15 or more minutes).  Arriving late affects you and other patients whose appointments are after yours.  Also, if you miss three or more appointments without notifying the office, you may be dismissed from the clinic at the provider's discretion.      For prescription refill requests, have your pharmacy contact our office and allow 72 hours for refills to be completed.    Today you received the following chemotherapy and/or immunotherapy agents trastuzumab, pertuzumab, docetaxel, carboplatin      To help prevent nausea and vomiting after your treatment, we encourage you to take your nausea medication as directed.  BELOW ARE SYMPTOMS THAT SHOULD BE REPORTED IMMEDIATELY: *FEVER GREATER THAN 100.4 F (38 C) OR HIGHER *CHILLS OR SWEATING *NAUSEA AND VOMITING THAT IS NOT CONTROLLED WITH YOUR NAUSEA MEDICATION *UNUSUAL SHORTNESS OF BREATH *UNUSUAL BRUISING OR BLEEDING *URINARY PROBLEMS (pain or burning when urinating, or frequent urination) *BOWEL PROBLEMS (unusual diarrhea, constipation, pain near the anus) TENDERNESS IN MOUTH AND THROAT WITH OR WITHOUT PRESENCE OF ULCERS (sore throat, sores in mouth, or a toothache) UNUSUAL RASH, SWELLING OR PAIN  UNUSUAL VAGINAL DISCHARGE OR ITCHING   Items with * indicate a potential emergency and should be followed up as soon as possible or go to the Emergency Department if any problems should occur.  Please show the CHEMOTHERAPY ALERT CARD or  IMMUNOTHERAPY ALERT CARD at check-in to the Emergency Department and triage nurse.  Should you have questions after your visit or need to cancel or reschedule your appointment, please contact [facility]  Dept: [phone]  and follow the prompts.  Office hours are 8:00 a.m. to 4:30 p.m. Monday - Friday. Please note that voicemails left after 4:00 p.m. may not be returned until the following business day.  We are closed weekends and major holidays. You have access to a nurse at all times for urgent questions. Please call the main number to the clinic Dept: [phone] and follow the prompts.   For any non-urgent questions, you may also contact your provider using MyChart. We now offer e-Visits for anyone 102 and older to request care online for non-urgent symptoms. For details visit [email or web address].   Also download the MyChart app! Go to the app store, search "MyChart", open the app, select [facility], and log in with your MyChart username and password.  Due to Covid, a mask is required upon entering the hospital/clinic. If you do not have a mask, one will be given to you upon arrival. For doctor visits, patients may have 1 support person aged 43 or older with them. For treatment visits, patients cannot have anyone with them due to current Covid guidelines and our immunocompromised population.   Trastuzumab injection for infusion What is this medication? TRASTUZUMAB (tras TOO zoo mab) is a monoclonal antibody. It is used to treat breast cancer and stomach cancer. This medicine may be used for other purposes; ask your health care provider or pharmacist if you have questions.  COMMON BRAND NAME(S): Herceptin, Janae Bridgeman, Ontruzant, Trazimera What should I tell my care team before I take this medication? They need to know if you have any of these conditions: heart disease heart failure lung or breathing disease, like asthma an unusual or  allergic reaction to trastuzumab, benzyl alcohol, or other medications, foods, dyes, or preservatives pregnant or trying to get pregnant breast-feeding How should I use this medication? This drug is given as an infusion into a vein. It is administered in a hospital or clinic by a specially trained health care professional. Talk to your pediatrician regarding the use of this medicine in children. This medicine is not approved for use in children. Overdosage: If you think you have taken too much of this medicine contact a poison control center or emergency room at once. NOTE: This medicine is only for you. Do not share this medicine with others. What if I miss a dose? It is important not to miss a dose. Call your doctor or health care professional if you are unable to keep an appointment. What may interact with this medication? This medicine may interact with the following medications: certain types of chemotherapy, such as daunorubicin, doxorubicin, epirubicin, and idarubicin This list may not describe all possible interactions. Give your health care provider a list of all the medicines, herbs, non-prescription drugs, or dietary supplements you use. Also tell them if you smoke, drink alcohol, or use illegal drugs. Some items may interact with your medicine. What should I watch for while using this medication? Visit your doctor for checks on your progress. Report any side effects. Continue your course of treatment even though you feel ill unless your doctor tells you to stop. Call your doctor or health care professional for advice if you get a fever, chills or sore throat, or other symptoms of a cold or flu. Do not treat yourself. Try to avoid being around people who are sick. You may experience fever, chills and shaking during your first infusion. These effects are usually mild and can be treated with other medicines. Report any side effects during the infusion to your health care professional. Fever  and chills usually do not happen with later infusions. Do not become pregnant while taking this medicine or for 7 months after stopping it. Women should inform their doctor if they wish to become pregnant or think they might be pregnant. Women of child-bearing potential will need to have a negative pregnancy test before starting this medicine. There is a potential for serious side effects to an unborn child. Talk to your health care professional or pharmacist for more information. Do not breast-feed an infant while taking this medicine or for 7 months after stopping it. Women must use effective birth control with this medicine. What side effects may I notice from receiving this medication? Side effects that you should report to your doctor or health care professional as soon as possible: allergic reactions like skin rash, itching or hives, swelling of the face, lips, or tongue chest pain or palpitations cough dizziness feeling faint or lightheaded, falls fever general ill feeling or flu-like symptoms signs of worsening heart failure like breathing problems; swelling in your legs and feet unusually weak or tired Side effects that usually do not require medical attention (report to your doctor or health care professional if they continue or are bothersome): bone pain changes in taste diarrhea joint pain nausea/vomiting weight loss This list may not describe all possible side effects. Call your doctor for medical advice  about side effects. You may report side effects to FDA at 1-800-FDA-1088. Where should I keep my medication? This drug is given in a hospital or clinic and will not be stored at home. NOTE: This sheet is a summary. It may not cover all possible information. If you have questions about this medicine, talk to your doctor, pharmacist, or health care provider.  2022 Elsevier/Gold Standard (2016-07-07 14:37:52)  Pertuzumab injection What is this medication? PERTUZUMAB (per TOOZ  ue mab) is a monoclonal antibody. It is used to treat breast cancer. This medicine may be used for other purposes; ask your health care provider or pharmacist if you have questions. COMMON BRAND NAME(S): PERJETA What should I tell my care team before I take this medication? They need to know if you have any of these conditions: heart disease heart failure high blood pressure history of irregular heart beat recent or ongoing radiation therapy an unusual or allergic reaction to pertuzumab, other medicines, foods, dyes, or preservatives pregnant or trying to get pregnant breast-feeding How should I use this medication? This medicine is for infusion into a vein. It is given by a health care professional in a hospital or clinic setting. Talk to your pediatrician regarding the use of this medicine in children. Special care may be needed. Overdosage: If you think you have taken too much of this medicine contact a poison control center or emergency room at once. NOTE: This medicine is only for you. Do not share this medicine with others. What if I miss a dose? It is important not to miss your dose. Call your doctor or health care professional if you are unable to keep an appointment. What may interact with this medication? Interactions are not expected. Give your health care provider a list of all the medicines, herbs, non-prescription drugs, or dietary supplements you use. Also tell them if you smoke, drink alcohol, or use illegal drugs. Some items may interact with your medicine. This list may not describe all possible interactions. Give your health care provider a list of all the medicines, herbs, non-prescription drugs, or dietary supplements you use. Also tell them if you smoke, drink alcohol, or use illegal drugs. Some items may interact with your medicine. What should I watch for while using this medication? Your condition will be monitored carefully while you are receiving this medicine.  Report any side effects. Continue your course of treatment even though you feel ill unless your doctor tells you to stop. Do not become pregnant while taking this medicine or for 7 months after stopping it. Women should inform their doctor if they wish to become pregnant or think they might be pregnant. Women of child-bearing potential will need to have a negative pregnancy test before starting this medicine. There is a potential for serious side effects to an unborn child. Talk to your health care professional or pharmacist for more information. Do not breast-feed an infant while taking this medicine or for 7 months after stopping it. Women must use effective birth control with this medicine. Call your doctor or health care professional for advice if you get a fever, chills or sore throat, or other symptoms of a cold or flu. Do not treat yourself. Try to avoid being around people who are sick. You may experience fever, chills, and headache during the infusion. Report any side effects during the infusion to your health care professional. What side effects may I notice from receiving this medication? Side effects that you should report to your doctor or  health care professional as soon as possible: breathing problems chest pain or palpitations dizziness feeling faint or lightheaded fever or chills skin rash, itching or hives sore throat swelling of the face, lips, or tongue swelling of the legs or ankles unusually weak or tired Side effects that usually do not require medical attention (report to your doctor or health care professional if they continue or are bothersome): diarrhea hair loss nausea, vomiting tiredness This list may not describe all possible side effects. Call your doctor for medical advice about side effects. You may report side effects to FDA at 1-800-FDA-1088. Where should I keep my medication? This drug is given in a hospital or clinic and will not be stored at home. NOTE:  This sheet is a summary. It may not cover all possible information. If you have questions about this medicine, talk to your doctor, pharmacist, or health care provider.  2022 Elsevier/Gold Standard (2015-08-15 12:08:50)  Docetaxel injection What is this medication? DOCETAXEL (doe se TAX el) is a chemotherapy drug. It targets fast dividing cells, like cancer cells, and causes these cells to die. This medicine is used to treat many types of cancers like breast cancer, certain stomach cancers, head and neck cancer, lung cancer, and prostate cancer. This medicine may be used for other purposes; ask your health care provider or pharmacist if you have questions. COMMON BRAND NAME(S): Docefrez, Taxotere What should I tell my care team before I take this medication? They need to know if you have any of these conditions: infection (especially a virus infection such as chickenpox, cold sores, or herpes) liver disease low blood counts, like low white cell, platelet, or red cell counts an unusual or allergic reaction to docetaxel, polysorbate 80, other chemotherapy agents, other medicines, foods, dyes, or preservatives pregnant or trying to get pregnant breast-feeding How should I use this medication? This drug is given as an infusion into a vein. It is administered in a hospital or clinic by a specially trained health care professional. Talk to your pediatrician regarding the use of this medicine in children. Special care may be needed. Overdosage: If you think you have taken too much of this medicine contact a poison control center or emergency room at once. NOTE: This medicine is only for you. Do not share this medicine with others. What if I miss a dose? It is important not to miss your dose. Call your doctor or health care professional if you are unable to keep an appointment. What may interact with this medication? Do not take this medicine with any of the following medications: live virus  vaccines This medicine may also interact with the following medications: aprepitant certain antibiotics like erythromycin or clarithromycin certain antivirals for HIV or hepatitis certain medicines for fungal infections like fluconazole, itraconazole, ketoconazole, posaconazole, or voriconazole cimetidine ciprofloxacin conivaptan cyclosporine dronedarone fluvoxamine grapefruit juice imatinib verapamil This list may not describe all possible interactions. Give your health care provider a list of all the medicines, herbs, non-prescription drugs, or dietary supplements you use. Also tell them if you smoke, drink alcohol, or use illegal drugs. Some items may interact with your medicine. What should I watch for while using this medication? Your condition will be monitored carefully while you are receiving this medicine. You will need important blood work done while you are taking this medicine. Call your doctor or health care professional for advice if you get a fever, chills or sore throat, or other symptoms of a cold or flu. Do not treat yourself.  This drug decreases your body's ability to fight infections. Try to avoid being around people who are sick. Some products may contain alcohol. Ask your health care professional if this medicine contains alcohol. Be sure to tell all health care professionals you are taking this medicine. Certain medicines, like metronidazole and disulfiram, can cause an unpleasant reaction when taken with alcohol. The reaction includes flushing, headache, nausea, vomiting, sweating, and increased thirst. The reaction can last from 30 minutes to several hours. You may get drowsy or dizzy. Do not drive, use machinery, or do anything that needs mental alertness until you know how this medicine affects you. Do not stand or sit up quickly, especially if you are an older patient. This reduces the risk of dizzy or fainting spells. Alcohol may interfere with the effect of this  medicine. Talk to your health care professional about your risk of cancer. You may be more at risk for certain types of cancer if you take this medicine. Do not become pregnant while taking this medicine or for 6 months after stopping it. Women should inform their doctor if they wish to become pregnant or think they might be pregnant. There is a potential for serious side effects to an unborn child. Talk to your health care professional or pharmacist for more information. Do not breast-feed an infant while taking this medicine or for 1 week after stopping it. Males who get this medicine must use a condom during sex with females who can get pregnant. If you get a woman pregnant, the baby could have birth defects. The baby could die before they are born. You will need to continue wearing a condom for 3 months after stopping the medicine. Tell your health care provider right away if your partner becomes pregnant while you are taking this medicine. This may interfere with the ability to father a child. You should talk to your doctor or health care professional if you are concerned about your fertility. What side effects may I notice from receiving this medication? Side effects that you should report to your doctor or health care professional as soon as possible: allergic reactions like skin rash, itching or hives, swelling of the face, lips, or tongue blurred vision breathing problems changes in vision low blood counts - This drug may decrease the number of white blood cells, red blood cells and platelets. You may be at increased risk for infections and bleeding. nausea and vomiting pain, redness or irritation at site where injected pain, tingling, numbness in the hands or feet redness, blistering, peeling, or loosening of the skin, including inside the mouth signs of decreased platelets or bleeding - bruising, pinpoint red spots on the skin, black, tarry stools, nosebleeds signs of decreased red blood  cells - unusually weak or tired, fainting spells, lightheadedness signs of infection - fever or chills, cough, sore throat, pain or difficulty passing urine swelling of the ankle, feet, hands Side effects that usually do not require medical attention (report to your doctor or health care professional if they continue or are bothersome): constipation diarrhea fingernail or toenail changes hair loss loss of appetite mouth sores muscle pain This list may not describe all possible side effects. Call your doctor for medical advice about side effects. You may report side effects to FDA at 1-800-FDA-1088. Where should I keep my medication? This drug is given in a hospital or clinic and will not be stored at home. NOTE: This sheet is a summary. It may not cover all possible information. If  you have questions about this medicine, talk to your doctor, pharmacist, or health care provider.  2022 Elsevier/Gold Standard (2019-06-12 19:50:31)  Carboplatin injection What is this medication? CARBOPLATIN (KAR boe pla tin) is a chemotherapy drug. It targets fast dividing cells, like cancer cells, and causes these cells to die. This medicine is used to treat ovarian cancer and many other cancers. This medicine may be used for other purposes; ask your health care provider or pharmacist if you have questions. COMMON BRAND NAME(S): Paraplatin What should I tell my care team before I take this medication? They need to know if you have any of these conditions: blood disorders hearing problems kidney disease recent or ongoing radiation therapy an unusual or allergic reaction to carboplatin, cisplatin, other chemotherapy, other medicines, foods, dyes, or preservatives pregnant or trying to get pregnant breast-feeding How should I use this medication? This drug is usually given as an infusion into a vein. It is administered in a hospital or clinic by a specially trained health care professional. Talk to your  pediatrician regarding the use of this medicine in children. Special care may be needed. Overdosage: If you think you have taken too much of this medicine contact a poison control center or emergency room at once. NOTE: This medicine is only for you. Do not share this medicine with others. What if I miss a dose? It is important not to miss a dose. Call your doctor or health care professional if you are unable to keep an appointment. What may interact with this medication? medicines for seizures medicines to increase blood counts like filgrastim, pegfilgrastim, sargramostim some antibiotics like amikacin, gentamicin, neomycin, streptomycin, tobramycin vaccines Talk to your doctor or health care professional before taking any of these medicines: acetaminophen aspirin ibuprofen ketoprofen naproxen This list may not describe all possible interactions. Give your health care provider a list of all the medicines, herbs, non-prescription drugs, or dietary supplements you use. Also tell them if you smoke, drink alcohol, or use illegal drugs. Some items may interact with your medicine. What should I watch for while using this medication? Your condition will be monitored carefully while you are receiving this medicine. You will need important blood work done while you are taking this medicine. This drug may make you feel generally unwell. This is not uncommon, as chemotherapy can affect healthy cells as well as cancer cells. Report any side effects. Continue your course of treatment even though you feel ill unless your doctor tells you to stop. In some cases, you may be given additional medicines to help with side effects. Follow all directions for their use. Call your doctor or health care professional for advice if you get a fever, chills or sore throat, or other symptoms of a cold or flu. Do not treat yourself. This drug decreases your body's ability to fight infections. Try to avoid being around people  who are sick. This medicine may increase your risk to bruise or bleed. Call your doctor or health care professional if you notice any unusual bleeding. Be careful brushing and flossing your teeth or using a toothpick because you may get an infection or bleed more easily. If you have any dental work done, tell your dentist you are receiving this medicine. Avoid taking products that contain aspirin, acetaminophen, ibuprofen, naproxen, or ketoprofen unless instructed by your doctor. These medicines may hide a fever. Do not become pregnant while taking this medicine. Women should inform their doctor if they wish to become pregnant or think they  might be pregnant. There is a potential for serious side effects to an unborn child. Talk to your health care professional or pharmacist for more information. Do not breast-feed an infant while taking this medicine. What side effects may I notice from receiving this medication? Side effects that you should report to your doctor or health care professional as soon as possible: allergic reactions like skin rash, itching or hives, swelling of the face, lips, or tongue signs of infection - fever or chills, cough, sore throat, pain or difficulty passing urine signs of decreased platelets or bleeding - bruising, pinpoint red spots on the skin, black, tarry stools, nosebleeds signs of decreased red blood cells - unusually weak or tired, fainting spells, lightheadedness breathing problems changes in hearing changes in vision chest pain high blood pressure low blood counts - This drug may decrease the number of white blood cells, red blood cells and platelets. You may be at increased risk for infections and bleeding. nausea and vomiting pain, swelling, redness or irritation at the injection site pain, tingling, numbness in the hands or feet problems with balance, talking, walking trouble passing urine or change in the amount of urine Side effects that usually do not  require medical attention (report to your doctor or health care professional if they continue or are bothersome): hair loss loss of appetite metallic taste in the mouth or changes in taste This list may not describe all possible side effects. Call your doctor for medical advice about side effects. You may report side effects to FDA at 1-800-FDA-1088. Where should I keep my medication? This drug is given in a hospital or clinic and will not be stored at home. NOTE: This sheet is a summary. It may not cover all possible information. If you have questions about this medicine, talk to your doctor, pharmacist, or health care provider.  2022 Elsevier/Gold Standard (2007-10-18 14:38:05)

## 2021-04-07 ENCOUNTER — Inpatient Hospital Stay: Payer: Federal, State, Local not specified - PPO

## 2021-04-07 ENCOUNTER — Ambulatory Visit: Payer: Federal, State, Local not specified - PPO

## 2021-04-07 ENCOUNTER — Other Ambulatory Visit: Payer: Self-pay

## 2021-04-07 VITALS — BP 115/94 | HR 84 | Temp 99.2°F | Resp 18

## 2021-04-07 DIAGNOSIS — M722 Plantar fascial fibromatosis: Secondary | ICD-10-CM | POA: Diagnosis not present

## 2021-04-07 DIAGNOSIS — Z170 Estrogen receptor positive status [ER+]: Secondary | ICD-10-CM | POA: Diagnosis not present

## 2021-04-07 DIAGNOSIS — Z5189 Encounter for other specified aftercare: Secondary | ICD-10-CM | POA: Diagnosis not present

## 2021-04-07 DIAGNOSIS — Z5112 Encounter for antineoplastic immunotherapy: Secondary | ICD-10-CM | POA: Diagnosis not present

## 2021-04-07 DIAGNOSIS — Z5111 Encounter for antineoplastic chemotherapy: Secondary | ICD-10-CM | POA: Diagnosis not present

## 2021-04-07 DIAGNOSIS — R110 Nausea: Secondary | ICD-10-CM | POA: Diagnosis not present

## 2021-04-07 DIAGNOSIS — C50412 Malignant neoplasm of upper-outer quadrant of left female breast: Secondary | ICD-10-CM

## 2021-04-07 MED ORDER — PEGFILGRASTIM-CBQV 6 MG/0.6ML ~~LOC~~ SOSY
6.0000 mg | PREFILLED_SYRINGE | Freq: Once | SUBCUTANEOUS | Status: AC
  Administered 2021-04-07: 6 mg via SUBCUTANEOUS
  Filled 2021-04-07: qty 0.6

## 2021-04-07 NOTE — Patient Instructions (Signed)

## 2021-04-08 ENCOUNTER — Telehealth: Payer: Self-pay | Admitting: Genetic Counselor

## 2021-04-08 ENCOUNTER — Encounter: Payer: Self-pay | Admitting: Genetic Counselor

## 2021-04-08 DIAGNOSIS — Z1379 Encounter for other screening for genetic and chromosomal anomalies: Secondary | ICD-10-CM | POA: Insufficient documentation

## 2021-04-08 NOTE — Progress Notes (Signed)
Joanna Osborn   Telephone:([phone]) [phone] [phone]   Clinic Follow up Note   Patient Care Team: Lennie Odor, [place] as PCP - General (Physician Assistant) Rockwell Germany, RN as Oncology Nurse Navigator Mauro Kaufmann, RN as Oncology Nurse Navigator Rolm Bookbinder, MD as Consulting Physician (General Surgery) Truitt Merle, MD as Consulting Physician (Hematology) Kyung Rudd, MD as Consulting Physician (Radiation Oncology) 04/09/2021  CHIEF COMPLAINT: Follow up left breast cancer, chemo toxicity check   SUMMARY OF ONCOLOGIC HISTORY: Oncology History Overview Note  Cancer Staging Malignant neoplasm of upper-outer quadrant of left breast in female, estrogen receptor positive ([place]) Staging form: Breast, AJCC 8th Edition - Clinical stage from 03/07/2021: Stage IB (cT2, cN0, cM0, G3, ER+, PR+, HER2+) - Signed by Truitt Merle, MD on 03/17/2021 Stage prefix: Initial diagnosis Histologic grading system: 3 grade system    Malignant neoplasm of upper-outer quadrant of left breast in female, estrogen receptor positive ([place])  03/05/2021 Mammogram   Diagnostic Left Mammogram; Left Breast Ultrasound  IMPRESSION: At the palpable site of concern in the left breast at 12 o'clock there is a suspicious mass measuring 2.1 cm.   03/07/2021 Cancer Staging   Staging form: Breast, AJCC 8th Edition - Clinical stage from 03/07/2021: Stage IB (cT2, cN0, cM0, G3, ER+, PR+, HER2+) - Signed by Truitt Merle, MD on 03/17/2021 Stage prefix: Initial diagnosis Histologic grading system: 3 grade system   03/07/2021 Pathology Results   Diagnosis Breast, left, needle core biopsy, left breast 12 o' clock - INVASIVE DUCTAL CARCINOMA - SEE COMMENT Based on the biopsy, the carcinoma appears Nottingham grade 3 of 3  PROGNOSTIC INDICATORS Results: IMMUNOHISTOCHEMICAL AND MORPHOMETRIC ANALYSIS PERFORMED MANUALLY The tumor cells are POSITIVE for Her2 (3+). Estrogen Receptor: 30%, POSITIVE, WEAK  STAINING INTENSITY Progesterone Receptor: 15%, POSITIVE, MODERATE STAINING INTENSITY Proliferation Marker Ki67: 40%     03/13/2021 Initial Diagnosis   Malignant neoplasm of upper-outer quadrant of left breast in female, estrogen receptor positive ([place])   03/26/2021 Imaging   MRI Breast  IMPRESSION: 1. 2.5 cm biopsy-proven malignancy within the UPPER LEFT breast. 2. Abnormal lymph node with focal cortical thickening in the posterior UPPER-OUTER LEFT breast/LOWER LEFT axilla. 2nd-look ultrasound with possible biopsy is recommended. 3. Indeterminate 0.8 cm OUTER RIGHT breast mass. 2nd-look ultrasound with possible biopsy is recommended.   03/29/2021 Genetic Testing   Negative hereditary cancer genetic testing: no pathogenic variants detected in [facility] CustomNext-Cancer +RNAinsight Panel.  The report date is March 29, 2021.   The CustomNext-Cancer+RNAinsight panel offered by [facility] includes sequencing and rearrangement analysis for the following 47 genes:  APC, ATM, AXIN2, BARD1, BMPR1A, BRCA1, BRCA2, BRIP1, CDH1, CDK4, CDKN2A, CHEK2, DICER1, EPCAM, GREM1, HOXB13, MEN1, MLH1, MSH2, MSH3, MSH6, MUTYH, NBN, NF1, NF2, NTHL1, PALB2, PMS2, POLD1, POLE, PTEN, RAD51C, RAD51D, RECQL, RET, SDHA, SDHAF2, SDHB, SDHC, SDHD, SMAD4, SMARCA4, STK11, TP53, TSC1, TSC2, and VHL.  RNA data is routinely analyzed for use in variant interpretation for all genes.   04/04/2021 -  Chemotherapy    Patient is on Treatment Plan: BREAST  DOCETAXEL + CARBOPLATIN + TRASTUZUMAB + PERTUZUMAB  (TCHP) Q21D          CURRENT THERAPY: Neoadjuvant TCHP (docetaxel, carboplatin, herceptin, perjeta) with GCSF, starting 04/04/21   INTERVAL HISTORY: Joanna Osborn returns for chemo toxicity check as scheduled. She completed C1 TCHP 9/9 and GCSF 9/12, denies bone pain.  She felt well until the evening on day 5 she developed mild nausea and gas pain.  Antiemetics were helpful.  She has soft stools up to 3/day.  She has not taken  medication because its not bothersome.  Her taste is changing, denies mucositis.  She did not eat yesterday due to abdominal discomfort from gas.  Prior to that she had been eating and drinking fine.  She is having hot flashes.  She began her menstrual period 04/06/2021 but is light compared to her normal.  She is very tired, able to manage at home with some difficulty.  Denies fever, chills, cough, chest pain, dyspnea, vomiting, rash, neuropathy, or other new concerns.   MEDICAL HISTORY:  Past Medical History:  Diagnosis Date   Anxiety    Breast cancer ([place])    Depression    Family history of colon cancer 03/19/2021   History of kidney stones    Hypertension     SURGICAL HISTORY: Past Surgical History:  Procedure Laterality Date   CHOLECYSTECTOMY  2002   INSERTION OF MESH N/A 10/28/2016   Procedure: INSERTION OF MESH;  Surgeon: Clovis [name], MD;  Location: [facility];  Service: General;  Laterality: N/A;   PORTACATH PLACEMENT Right 04/02/2021   Procedure: INSERTION PORT-A-CATH;  Surgeon: Rolm Bookbinder, MD;  Location: [facility];  Service: General;  Laterality: Right;   [place] N/A 10/28/2016   Procedure: LAPAROSCOPIC VENTRAL HERNIA REPAIR;  Surgeon: Clovis [name], MD;  Location: [facility];  Service: General;  Laterality: N/A;   WRIST SURGERY      I have reviewed the social history and family history with the patient and they are unchanged from previous note.  ALLERGIES:  is allergic to no known allergies.  MEDICATIONS:  Current Outpatient Medications  Medication Sig Dispense Refill   dexamethasone (DECADRON) 4 MG tablet Take 1 tablet (4 mg total) by mouth 2 (two) times daily. Start the day before Taxotere. Then take daily x 3 days after chemotherapy. 30 tablet 1   hydrochlorothiazide (HYDRODIURIL) 25 MG tablet Take 25 mg by mouth daily.     lidocaine-prilocaine (EMLA) cream Apply to affected area once 30 g 3   ondansetron (ZOFRAN)  8 MG tablet Take 1 tablet (8 mg total) by mouth 2 (two) times daily as needed (Nausea or vomiting). Start on the third day after chemotherapy. 30 tablet 1   prochlorperazine (COMPAZINE) 10 MG tablet Take 1 tablet (10 mg total) by mouth every 6 (six) hours as needed (Nausea or vomiting). 30 tablet 1   traMADol (ULTRAM) 50 MG tablet Take 2 tablets (100 mg total) by mouth every 6 (six) hours as needed. 6 tablet 0   No current facility-administered medications for this visit.    PHYSICAL EXAMINATION: ECOG PERFORMANCE STATUS: 1 - Symptomatic but completely ambulatory  Vitals:   04/09/21 0922  BP: 119/87  Pulse: (!) 102  Resp: 20  Temp: 99 F (37.2 C)  SpO2: 99%   Filed Weights   04/09/21 0922  Weight: 217 lb 6.4 oz (98.6 kg)    GENERAL:alert, no distress and comfortable SKIN: No rash EYES: sclera clear OROPHARYNX: No thrush or ulcers LUNGS: clear with normal breathing effort HEART: regular rate & rhythm, no lower extremity edema ABDOMEN:abdomen soft, non-tender and normal bowel sounds NEURO: alert & oriented x 3 with fluent speech, no focal motor/sensory deficits PAC without erythema, mild rash surrounding right chest incision Breast exam deferred   LABORATORY DATA:  I have reviewed the data as listed CBC Latest Ref Rng & Units 04/09/2021 04/03/2021 03/19/2021  WBC  4.0 - 10.5 K/uL 10.9(H) 14.6(H) 6.5  Hemoglobin 12.0 - 15.0 g/dL 11.5(L) 10.5(L) 11.3(L)  Hematocrit 36.0 - 46.0 % 35.4(L) 33.1(L) 35.6(L)  Platelets 150 - 400 K/uL 416(H) 482(H) 492(H)     CMP Latest Ref Rng & Units 04/09/2021 04/03/2021 03/19/2021  Glucose 70 - 99 mg/dL 130(H) 102(H) 127(H)  BUN 6 - 20 mg/dL 16 14 12   Creatinine 0.44 - 1.00 mg/dL 0.82 0.80 0.83  Sodium 135 - 145 mmol/L 135 142 138  Potassium 3.5 - 5.1 mmol/L 3.6 4.0 3.6  Chloride 98 - 111 mmol/L 99 112(H) 103  CO2 22 - 32 mmol/L 23 19(L) 24  Calcium 8.9 - 10.3 mg/dL 9.2 9.6 9.2  Total Protein 6.5 - 8.1 g/dL 7.2 7.0 7.2  Total Bilirubin 0.3 -  1.2 mg/dL 1.2 0.6 0.6  Alkaline Phos 38 - 126 U/L 61 56 52  AST 15 - 41 U/L 18 37 21  ALT 0 - 44 U/L 23 56(H) 21      RADIOGRAPHIC STUDIES: I have personally reviewed the radiological images as listed and agreed with the findings in the report. No results found.   ASSESSMENT & PLAN: Joanna Osborn is a 50 y.o. female with    1. 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 Left mammogram on 03/05/21 showed 2.1 cm mass at 12 o'clock. Biopsy confirmed invasive ductal carcinoma, grade 3, weakly ER+ and Her2+ -Given her Her2+ disease, she began neoadjuvant TCHP q3 weeks for 6 cycles, followed by Herceptin w or wo Perjeta maintenance therapy for a total of one year.   -Breast MRI 03/26/21 showed additional areas of concern-- a left breast/axilla lymph node, and a right breast mass. She is scheduled for second-look ultrasounds and possible biopsies on 04/14/21. If she has positive node disease, we will refer her for staging work-up -echo reviewed, no concerns  -She began neoadjuvant TCHP 04/03/2021 with G-CSF  2. Anxiety and depression, HTN -f/u PCP -continue HCTZ, will monitor her blood pressure closely during chemotherapy.  Advised her to hold if she is not drinking well or feels dizzy -Social work follow-up   3. Mild Anemia -her hgb has dropped recently, down to 10.5 today. -she still has periods, current cycle began 04/06/2021, bleeding is light -She began oral iron   Disposition: Joanna Osborn appears stable.  S/p cycle 1 day 7 TCHP with G-CSF.  She tolerated mostly well with mild fatigue, taste change, nausea, soft stool, and bloating/gas pain which is her main complaint.  We reviewed symptom management, she can begin Gas-X.  She will start Imodium if stools become looser or more frequent.  Encouraged her to push hydration and nutrition and begin supplement given her weight loss.  She does  not need supportive care such as IV fluids today.  Labs reviewed, CBC and CMP are stable.  Continue supportive care at home.  We reviewed red flags to call/notify.  She is scheduled for second look ultrasound and possible biopsy on 04/14/2021.  Follow-up in 2 weeks with cycle 2, or sooner if needed.  All questions were answered. The patient knows to call the clinic with any problems, questions or concerns. No barriers to learning were detected.  Total encounter time is 30 minutes.     Alla Feeling, NP 04/09/21

## 2021-04-08 NOTE — Telephone Encounter (Signed)
Contacted patient in attempt to disclose results of genetic testing.  LVM with contact information requesting a call back.  

## 2021-04-09 ENCOUNTER — Encounter: Payer: Self-pay | Admitting: Nurse Practitioner

## 2021-04-09 ENCOUNTER — Inpatient Hospital Stay: Payer: Federal, State, Local not specified - PPO

## 2021-04-09 ENCOUNTER — Inpatient Hospital Stay (HOSPITAL_BASED_OUTPATIENT_CLINIC_OR_DEPARTMENT_OTHER): Payer: Federal, State, Local not specified - PPO | Admitting: Nurse Practitioner

## 2021-04-09 ENCOUNTER — Other Ambulatory Visit: Payer: Self-pay

## 2021-04-09 VITALS — BP 119/87 | HR 102 | Temp 99.0°F | Resp 20 | Ht 65.0 in | Wt 217.4 lb

## 2021-04-09 DIAGNOSIS — Z170 Estrogen receptor positive status [ER+]: Secondary | ICD-10-CM | POA: Diagnosis not present

## 2021-04-09 DIAGNOSIS — R110 Nausea: Secondary | ICD-10-CM | POA: Diagnosis not present

## 2021-04-09 DIAGNOSIS — C50412 Malignant neoplasm of upper-outer quadrant of left female breast: Secondary | ICD-10-CM | POA: Diagnosis not present

## 2021-04-09 DIAGNOSIS — Z5189 Encounter for other specified aftercare: Secondary | ICD-10-CM | POA: Diagnosis not present

## 2021-04-09 DIAGNOSIS — Z5111 Encounter for antineoplastic chemotherapy: Secondary | ICD-10-CM | POA: Diagnosis not present

## 2021-04-09 DIAGNOSIS — Z5112 Encounter for antineoplastic immunotherapy: Secondary | ICD-10-CM | POA: Diagnosis not present

## 2021-04-09 DIAGNOSIS — Z95828 Presence of other vascular implants and grafts: Secondary | ICD-10-CM

## 2021-04-09 LAB — CBC WITH DIFFERENTIAL (CANCER CENTER ONLY)
Abs Immature Granulocytes: 0.73 10*3/uL — ABNORMAL HIGH (ref 0.00–0.07)
Basophils Absolute: 0.1 10*3/uL (ref 0.0–0.1)
Basophils Relative: 1 %
Eosinophils Absolute: 0 10*3/uL (ref 0.0–0.5)
Eosinophils Relative: 0 %
HCT: 35.4 % — ABNORMAL LOW (ref 36.0–46.0)
Hemoglobin: 11.5 g/dL — ABNORMAL LOW (ref 12.0–15.0)
Immature Granulocytes: 7 %
Lymphocytes Relative: 12 %
Lymphs Abs: 1.3 10*3/uL (ref 0.7–4.0)
MCH: 26.2 pg (ref 26.0–34.0)
MCHC: 32.5 g/dL (ref 30.0–36.0)
MCV: 80.6 fL (ref 80.0–100.0)
Monocytes Absolute: 0 10*3/uL — ABNORMAL LOW (ref 0.1–1.0)
Monocytes Relative: 0 %
Neutro Abs: 8.7 10*3/uL — ABNORMAL HIGH (ref 1.7–7.7)
Neutrophils Relative %: 80 %
Platelet Count: 416 10*3/uL — ABNORMAL HIGH (ref 150–400)
RBC: 4.39 MIL/uL (ref 3.87–5.11)
RDW: 14.9 % (ref 11.5–15.5)
WBC Count: 10.9 10*3/uL — ABNORMAL HIGH (ref 4.0–10.5)
nRBC: 0 % (ref 0.0–0.2)

## 2021-04-09 LAB — CMP (CANCER CENTER ONLY)
ALT: 23 U/L (ref 0–44)
AST: 18 U/L (ref 15–41)
Albumin: 4.1 g/dL (ref 3.5–5.0)
Alkaline Phosphatase: 61 U/L (ref 38–126)
Anion gap: 13 (ref 5–15)
BUN: 16 mg/dL (ref 6–20)
CO2: 23 mmol/L (ref 22–32)
Calcium: 9.2 mg/dL (ref 8.9–10.3)
Chloride: 99 mmol/L (ref 98–111)
Creatinine: 0.82 mg/dL (ref 0.44–1.00)
GFR, Estimated: >60 mL/min (ref >60)
Glucose, Bld: 130 mg/dL — ABNORMAL HIGH (ref 70–99)
Potassium: 3.6 mmol/L (ref 3.5–5.1)
Sodium: 135 mmol/L (ref 135–145)
Total Bilirubin: 1.2 mg/dL (ref 0.3–1.2)
Total Protein: 7.2 g/dL (ref 6.5–8.1)

## 2021-04-09 MED ORDER — HEPARIN SOD (PORK) LOCK FLUSH 100 UNIT/ML IV SOLN
500.0000 [IU] | Freq: Once | INTRAVENOUS | Status: AC
Start: 2021-04-09 — End: 2021-04-09
  Administered 2021-04-09: 500 [IU]

## 2021-04-09 MED ORDER — SODIUM CHLORIDE 0.9% FLUSH
10.0000 mL | Freq: Once | INTRAVENOUS | Status: AC
  Administered 2021-04-09: 10 mL

## 2021-04-10 ENCOUNTER — Other Ambulatory Visit: Payer: Self-pay | Admitting: Nurse Practitioner

## 2021-04-10 ENCOUNTER — Inpatient Hospital Stay: Payer: Federal, State, Local not specified - PPO

## 2021-04-10 ENCOUNTER — Ambulatory Visit: Payer: Federal, State, Local not specified - PPO | Admitting: Nutrition

## 2021-04-10 ENCOUNTER — Telehealth: Payer: Self-pay | Admitting: Hematology

## 2021-04-10 ENCOUNTER — Telehealth: Payer: Self-pay

## 2021-04-10 ENCOUNTER — Other Ambulatory Visit: Payer: Self-pay

## 2021-04-10 ENCOUNTER — Other Ambulatory Visit: Payer: Self-pay | Admitting: Hematology

## 2021-04-10 VITALS — BP 115/83 | HR 103 | Temp 98.7°F | Resp 16

## 2021-04-10 DIAGNOSIS — Z95828 Presence of other vascular implants and grafts: Secondary | ICD-10-CM

## 2021-04-10 DIAGNOSIS — C50412 Malignant neoplasm of upper-outer quadrant of left female breast: Secondary | ICD-10-CM

## 2021-04-10 DIAGNOSIS — Z5112 Encounter for antineoplastic immunotherapy: Secondary | ICD-10-CM | POA: Diagnosis not present

## 2021-04-10 DIAGNOSIS — Z170 Estrogen receptor positive status [ER+]: Secondary | ICD-10-CM

## 2021-04-10 DIAGNOSIS — Z5111 Encounter for antineoplastic chemotherapy: Secondary | ICD-10-CM | POA: Diagnosis not present

## 2021-04-10 DIAGNOSIS — E860 Dehydration: Secondary | ICD-10-CM

## 2021-04-10 DIAGNOSIS — R110 Nausea: Secondary | ICD-10-CM | POA: Diagnosis not present

## 2021-04-10 DIAGNOSIS — Z5189 Encounter for other specified aftercare: Secondary | ICD-10-CM | POA: Diagnosis not present

## 2021-04-10 MED ORDER — SODIUM CHLORIDE 0.9% FLUSH
10.0000 mL | Freq: Once | INTRAVENOUS | Status: AC
  Administered 2021-04-10: 10 mL

## 2021-04-10 MED ORDER — DIPHENOXYLATE-ATROPINE 2.5-0.025 MG PO TABS: 1.0000 | ORAL_TABLET | Freq: Four times a day (QID) | ORAL | 1 refills | Status: DC | PRN

## 2021-04-10 MED ORDER — SODIUM CHLORIDE 0.9 % IV SOLN: INTRAVENOUS | Status: AC

## 2021-04-10 MED ORDER — HEPARIN SOD (PORK) LOCK FLUSH 100 UNIT/ML IV SOLN
500.0000 [IU] | Freq: Once | INTRAVENOUS | Status: AC
  Administered 2021-04-10: 500 [IU]

## 2021-04-10 MED ORDER — SODIUM CHLORIDE 0.9 % IV SOLN: 8.0000 mg | Freq: Once | INTRAVENOUS | Status: DC

## 2021-04-10 MED ORDER — ONDANSETRON HCL 4 MG/2ML IJ SOLN
8.0000 mg | Freq: Once | INTRAMUSCULAR | Status: AC
  Administered 2021-04-10: 8 mg via INTRAVENOUS
  Filled 2021-04-10: qty 4

## 2021-04-10 MED ORDER — SODIUM CHLORIDE 0.9 % IV SOLN: Freq: Once | INTRAVENOUS | Status: AC

## 2021-04-10 MED ORDER — LOPERAMIDE HCL 2 MG PO TABS
2.0000 mg | ORAL_TABLET | Freq: Once | ORAL | Status: DC
  Filled 2021-04-10: qty 1

## 2021-04-10 NOTE — Telephone Encounter (Signed)
Patient last night for worsening diarrhea, more than 10 watery bowel movement last night.  Intermittent nausea, no vomiting, no fever.  I arranged IV fluids for her today.  I saw her in the infusion room briefly in the afternoon, diarrhea has improved today, she is taking Imodium.  Vital signs stable except for significant weight loss.  She will see our dietitian today.  I called in [place] for her today.  We will arrange additional to MRI first in 2 days.  She knows to call us early next week if she is not recovering well.  Truitt Merle  04/10/2021

## 2021-04-10 NOTE — Progress Notes (Signed)
Provider requested urgent referral.  Patient is a 50 year old female diagnosed with breast cancer receiving carbo/Taxol, Herceptin and Perjeta.  She is followed by Dr. Burr Medico.  Past medical history includes anxiety, depression, hypertension.  Medications include Decadron, Zofran, Compazine.  Labs include glucose 130.  Height: 65 inches. Weight: 217.4 pounds. Usual body weight: 227 pounds August 2022. BMI: 36.18.  Patient reports several days after treatment where she could not eat or drink.  Reports loose stools and gas pains.  She also reports mild nausea.  She typically does not eat much meat but she will eat fish.  She would like to now how to increase her protein intake.  Nutrition diagnosis: Unintended weight loss related to breast cancer and associated treatments as evidenced by 4% weight loss  Intervention: Educated on consuming smaller more frequent meals and snacks with adequate calories and protein to maintain lean body mass. Brief education provided for diarrhea. Encouraged nausea medication if she experiences nausea.  Education on diet provided. Reviewed high-protein foods. Provided samples of oral nutrition supplements.  Nutrition fact sheets provided.  Questions were answered.  Teach back method used.  Monitoring, evaluation, goals: Patient will tolerate adequate calories and protein to minimize weight loss.  Next visit: Monday, October 3 with Joli.  **Disclaimer: This note was dictated with voice recognition software. Similar sounding words can inadvertently be transcribed and this note may contain transcription errors which may not have been corrected upon publication of note.**

## 2021-04-10 NOTE — Patient Instructions (Signed)

## 2021-04-10 NOTE — Telephone Encounter (Signed)
This nurse spoke with patient and made aware that we have ordered for her to have IVF today.  Appointment time is at 2 pm. Patient is an agreement.  This nurse spoke with patient about taking antiemetics and Imodium.  Patient states that she has been taking Pepto Bismol instead of Imodium and that has been helping some.  States the diarrhea is not as bad today as it was yesterday.   No further questions or concerns at this time.

## 2021-04-12 ENCOUNTER — Inpatient Hospital Stay: Payer: Federal, State, Local not specified - PPO

## 2021-04-12 ENCOUNTER — Other Ambulatory Visit: Payer: Self-pay | Admitting: Oncology

## 2021-04-12 ENCOUNTER — Other Ambulatory Visit: Payer: Self-pay

## 2021-04-12 VITALS — BP 118/89 | HR 99 | Temp 99.1°F | Resp 17

## 2021-04-12 DIAGNOSIS — Z5111 Encounter for antineoplastic chemotherapy: Secondary | ICD-10-CM | POA: Diagnosis not present

## 2021-04-12 DIAGNOSIS — Z170 Estrogen receptor positive status [ER+]: Secondary | ICD-10-CM | POA: Diagnosis not present

## 2021-04-12 DIAGNOSIS — Z5189 Encounter for other specified aftercare: Secondary | ICD-10-CM | POA: Diagnosis not present

## 2021-04-12 DIAGNOSIS — Z5112 Encounter for antineoplastic immunotherapy: Secondary | ICD-10-CM | POA: Diagnosis not present

## 2021-04-12 DIAGNOSIS — R110 Nausea: Secondary | ICD-10-CM | POA: Diagnosis not present

## 2021-04-12 DIAGNOSIS — R350 Frequency of micturition: Secondary | ICD-10-CM

## 2021-04-12 DIAGNOSIS — Z95828 Presence of other vascular implants and grafts: Secondary | ICD-10-CM

## 2021-04-12 DIAGNOSIS — N390 Urinary tract infection, site not specified: Secondary | ICD-10-CM

## 2021-04-12 DIAGNOSIS — C50412 Malignant neoplasm of upper-outer quadrant of left female breast: Secondary | ICD-10-CM | POA: Diagnosis not present

## 2021-04-12 LAB — URINALYSIS, DIPSTICK ONLY
Bilirubin Urine: NEGATIVE
Glucose, UA: NEGATIVE mg/dL
Ketones, ur: 20 mg/dL — AB
Leukocytes,Ua: NEGATIVE
Nitrite: NEGATIVE
Protein, ur: NEGATIVE mg/dL
Specific Gravity, Urine: 1.015 (ref 1.005–1.030)
pH: 5 (ref 5.0–8.0)

## 2021-04-12 MED ORDER — HEPARIN SOD (PORK) LOCK FLUSH 100 UNIT/ML IV SOLN
500.0000 [IU] | Freq: Once | INTRAVENOUS | Status: AC | PRN
  Administered 2021-04-12: 500 [IU]

## 2021-04-12 MED ORDER — SODIUM CHLORIDE 0.9% FLUSH
10.0000 mL | Freq: Once | INTRAVENOUS | Status: AC | PRN
  Administered 2021-04-12: 10 mL

## 2021-04-12 MED ORDER — SODIUM CHLORIDE 0.9 % IV SOLN: Freq: Once | INTRAVENOUS | Status: AC

## 2021-04-12 MED ORDER — CIPROFLOXACIN HCL 500 MG PO TABS: 500.0000 mg | ORAL_TABLET | Freq: Two times a day (BID) | ORAL | 0 refills | Status: DC

## 2021-04-12 NOTE — Progress Notes (Signed)
Patient presented today for hydration fluids. Patient states diarrhea is better but started having urinary frequency, burning and blood on tissue when she wipes. MD notified. Will collect UA with culture per Dr. Alen Blew. MD will call in antibiotic prescription to pharmacy as well.

## 2021-04-12 NOTE — Patient Instructions (Signed)
[facility] MEDICAL ONCOLOG[facility]  Discharge Instructions: Thank you for choosing [facility] to provide your oncology and hematology care.   If you have a lab appointment with the Cancer Center, please go directly to the Cancer Center and check in at the registration area.   Wear comfortable clothing and clothing appropriate for easy access to any Portacath or PICC line.   We strive to give you quality time with your provider. You may need to reschedule your appointment if you arrive late (15 or more minutes).  Arriving late affects you and other patients whose appointments are after yours.  Also, if you miss three or more appointments without notifying the office, you may be dismissed from the clinic at the provider's discretion.      For prescription refill requests, have your pharmacy contact our office and allow 72 hours for refills to be completed.       To help prevent nausea and vomiting after your treatment, we encourage you to take your nausea medication as directed.  BELOW ARE SYMPTOMS THAT SHOULD BE REPORTED IMMEDIATELY: *FEVER GREATER THAN 100.4 F (38 C) OR HIGHER *CHILLS OR SWEATING *NAUSEA AND VOMITING THAT IS NOT CONTROLLED WITH YOUR NAUSEA MEDICATION *UNUSUAL SHORTNESS OF BREATH *UNUSUAL BRUISING OR BLEEDING *URINARY PROBLEMS (pain or burning when urinating, or frequent urination) *BOWEL PROBLEMS (unusual diarrhea, constipation, pain near the anus) TENDERNESS IN MOUTH AND THROAT WITH OR WITHOUT PRESENCE OF ULCERS (sore throat, sores in mouth, or a toothache) UNUSUAL RASH, SWELLING OR PAIN  UNUSUAL VAGINAL DISCHARGE OR ITCHING   Items with * indicate a potential emergency and should be followed up as soon as possible or go to the Emergency Department if any problems should occur.  Please show the CHEMOTHERAPY ALERT CARD or IMMUNOTHERAPY ALERT CARD at check-in to the Emergency Department and triage nurse.  Should you have questions after your  visit or need to cancel or reschedule your appointment, please contact [facility]  Dept: [phone]  and follow the prompts.  Office hours are 8:00 a.m. to 4:30 p.m. Monday - Friday. Please note that voicemails left after 4:00 p.m. may not be returned until the following business day.  We are closed weekends and major holidays. You have access to a nurse at all times for urgent questions. Please call the main number to the clinic Dept: [phone] and follow the prompts.   For any non-urgent questions, you may also contact your provider using MyChart. We now offer e-Visits for anyone 18 and older to request care online for non-urgent symptoms. For details visit [email or web address].   Also download the MyChart app! Go to the app store, search "MyChart", open the app, select [facility], and log in with your MyChart username and password.  Due to Covid, a mask is required upon entering the hospital/clinic. If you do not have a mask, one will be given to you upon arrival. For doctor visits, patients may have 1 support person aged 18 or older with them. For treatment visits, patients cannot have anyone with them due to current Covid guidelines and our immunocompromised population.   

## 2021-04-13 LAB — URINE CULTURE: Culture: <10000 — AB

## 2021-04-14 ENCOUNTER — Other Ambulatory Visit: Payer: Self-pay

## 2021-04-14 ENCOUNTER — Ambulatory Visit
Admission: RE | Admit: 2021-04-14 | Discharge: 2021-04-14 | Disposition: A | Discharge status: Home / Self Care | Payer: Federal, State, Local not specified - PPO | Source: Ambulatory Visit | Attending: Hematology | Admitting: Hematology

## 2021-04-14 DIAGNOSIS — R928 Other abnormal and inconclusive findings on diagnostic imaging of breast: Secondary | ICD-10-CM

## 2021-04-14 DIAGNOSIS — N6489 Other specified disorders of breast: Secondary | ICD-10-CM | POA: Diagnosis not present

## 2021-04-15 ENCOUNTER — Other Ambulatory Visit: Payer: Self-pay | Admitting: Hematology

## 2021-04-15 ENCOUNTER — Other Ambulatory Visit: Payer: Self-pay | Admitting: *Deleted

## 2021-04-15 ENCOUNTER — Encounter: Payer: Self-pay | Admitting: *Deleted

## 2021-04-15 DIAGNOSIS — Z170 Estrogen receptor positive status [ER+]: Secondary | ICD-10-CM

## 2021-04-15 DIAGNOSIS — C50412 Malignant neoplasm of upper-outer quadrant of left female breast: Secondary | ICD-10-CM

## 2021-04-17 ENCOUNTER — Encounter: Payer: Federal, State, Local not specified - PPO | Admitting: Nutrition

## 2021-04-17 ENCOUNTER — Ambulatory Visit: Payer: Self-pay | Admitting: Genetic Counselor

## 2021-04-17 DIAGNOSIS — Z1379 Encounter for other screening for genetic and chromosomal anomalies: Secondary | ICD-10-CM

## 2021-04-17 DIAGNOSIS — Z800 Family history of malignant neoplasm of digestive organs: Secondary | ICD-10-CM

## 2021-04-17 DIAGNOSIS — Z170 Estrogen receptor positive status [ER+]: Secondary | ICD-10-CM

## 2021-04-17 DIAGNOSIS — C50412 Malignant neoplasm of upper-outer quadrant of left female breast: Secondary | ICD-10-CM

## 2021-04-17 NOTE — Telephone Encounter (Signed)
Revealed negative genetic testing.  Discussed that we do not know why she has breast cancer or why there is cancer in the family. It could be sporadic/familial, due to a different gene that we are not testing, or maybe our current technology may not be able to pick something up.  It will be important for her to keep in contact with genetics to keep up with whether additional testing may be needed.   

## 2021-04-18 ENCOUNTER — Encounter: Payer: Self-pay | Admitting: Hematology

## 2021-04-18 NOTE — Progress Notes (Signed)
HPI:  Joanna Osborn was previously seen in the [place] clinic due to a personal history of cancer and concerns regarding a hereditary predisposition to cancer. Please refer to our prior cancer genetics clinic note for more information regarding our discussion, assessment and recommendations, at the time. Joanna Osborn recent genetic test results were disclosed to her, as were recommendations warranted by these results. These results and recommendations are discussed in more detail below.  CANCER HISTORY:  Oncology History Overview Note  Cancer Staging Malignant neoplasm of upper-outer quadrant of left breast in female, estrogen receptor positive ([place]) Staging form: Breast, AJCC 8th Edition - Clinical stage from 03/07/2021: Stage IB (cT2, cN0, cM0, G3, ER+, PR+, HER2+) - Signed by Truitt Merle, MD on 03/17/2021 Stage prefix: Initial diagnosis Histologic grading system: 3 grade system    Malignant neoplasm of upper-outer quadrant of left breast in female, estrogen receptor positive ([place])  03/05/2021 Mammogram   Diagnostic Left Mammogram; Left Breast Ultrasound  IMPRESSION: At the palpable site of concern in the left breast at 12 o'clock there is a suspicious mass measuring 2.1 cm.   03/07/2021 Cancer Staging   Staging form: Breast, AJCC 8th Edition - Clinical stage from 03/07/2021: Stage IB (cT2, cN0, cM0, G3, ER+, PR+, HER2+) - Signed by Truitt Merle, MD on 03/17/2021 Stage prefix: Initial diagnosis Histologic grading system: 3 grade system   03/07/2021 Pathology Results   Diagnosis Breast, left, needle core biopsy, left breast 12 o' clock - INVASIVE DUCTAL CARCINOMA - SEE COMMENT Based on the biopsy, the carcinoma appears Nottingham grade 3 of 3  PROGNOSTIC INDICATORS Results: IMMUNOHISTOCHEMICAL AND MORPHOMETRIC ANALYSIS PERFORMED MANUALLY The tumor cells are POSITIVE for Her2 (3+). Estrogen Receptor: 30%, POSITIVE, WEAK STAINING INTENSITY Progesterone Receptor: 15%, POSITIVE,  MODERATE STAINING INTENSITY Proliferation Marker Ki67: 40%     03/13/2021 Initial Diagnosis   Malignant neoplasm of upper-outer quadrant of left breast in female, estrogen receptor positive ([place])   03/26/2021 Imaging   MRI Breast  IMPRESSION: 1. 2.5 cm biopsy-proven malignancy within the UPPER LEFT breast. 2. Abnormal lymph node with focal cortical thickening in the posterior UPPER-OUTER LEFT breast/LOWER LEFT axilla. 2nd-look ultrasound with possible biopsy is recommended. 3. Indeterminate 0.8 cm OUTER RIGHT breast mass. 2nd-look ultrasound with possible biopsy is recommended.   03/29/2021 Genetic Testing   Negative hereditary cancer genetic testing: no pathogenic variants detected in [facility] CustomNext-Cancer +RNAinsight Panel.  The report date is March 29, 2021.   The CustomNext-Cancer+RNAinsight panel offered by [facility] includes sequencing and rearrangement analysis for the following 47 genes:  APC, ATM, AXIN2, BARD1, BMPR1A, BRCA1, BRCA2, BRIP1, CDH1, CDK4, CDKN2A, CHEK2, DICER1, EPCAM, GREM1, HOXB13, MEN1, MLH1, MSH2, MSH3, MSH6, MUTYH, NBN, NF1, NF2, NTHL1, PALB2, PMS2, POLD1, POLE, PTEN, RAD51C, RAD51D, RECQL, RET, SDHA, SDHAF2, SDHB, SDHC, SDHD, SMAD4, SMARCA4, STK11, TP53, TSC1, TSC2, and VHL.  RNA data is routinely analyzed for use in variant interpretation for all genes.   04/04/2021 -  Chemotherapy    Patient is on Treatment Plan: BREAST  DOCETAXEL + CARBOPLATIN + TRASTUZUMAB + PERTUZUMAB  (TCHP) Q21D          FAMILY HISTORY:  We obtained a detailed, 4-generation family history.  Significant diagnoses are listed below: Family History  Problem Relation Age of Onset   Cancer Paternal Aunt        unknown type; dx after 47   Colon cancer Paternal Grandmother        dx unknown age   Colon cancer Paternal Grandfather  dx after 30   Cancer Other    Cancer Cousin        unknown type; dx before 8     Joanna Osborn is unaware of previous family history of genetic  testing for hereditary cancer risks. There is no reported Ashkenazi Jewish ancestry. There is no known consanguinity.    GENETIC TEST RESULTS: Genetic testing reported out on March 29, 2021.  The CustomNext-Cancer +RNAinsight Panel found no pathogenic mutations. The CustomNext-Cancer+RNAinsight panel offered by [facility] includes sequencing and rearrangement analysis for the following 47 genes:  APC, ATM, AXIN2, BARD1, BMPR1A, BRCA1, BRCA2, BRIP1, CDH1, CDK4, CDKN2A, CHEK2, DICER1, EPCAM, GREM1, HOXB13, MEN1, MLH1, MSH2, MSH3, MSH6, MUTYH, NBN, NF1, NF2, NTHL1, PALB2, PMS2, POLD1, POLE, PTEN, RAD51C, RAD51D, RECQL, RET, SDHA, SDHAF2, SDHB, SDHC, SDHD, SMAD4, SMARCA4, STK11, TP53, TSC1, TSC2, and VHL.  RNA data is routinely analyzed for use in variant interpretation for all genes.  The test report has been scanned into EPIC and is located under the Molecular Pathology section of the Results Review tab.  A portion of the result report is included below for reference.     We discussed with Joanna Osborn that because current genetic testing is not perfect, it is possible there may be a gene mutation in one of these genes that current testing cannot detect, but that chance is small.  We also discussed, that there could be another gene that has not yet been discovered, or that we have not yet tested, that is responsible for the cancer diagnoses in the family. It is also possible there is a hereditary cause for the cancer in the family that Joanna Osborn did not inherit and therefore was not identified in her testing.  Therefore, it is important to remain in touch with cancer genetics in the future so that we can continue to offer Joanna Osborn the most up to date genetic testing.    ADDITIONAL GENETIC TESTING: There are other genes that are associated with increased cancer risk that can be analyzed. Should Joanna Osborn wish to pursue additional genetic testing, we are happy to discuss and coordinate this testing, at  any time.     CANCER SCREENING RECOMMENDATIONS: Joanna Osborn test result is considered negative (normal).  This means that we have not identified a hereditary cause for her personal history of cancer at this time. Most cancers are sporadic/familial, and this negative test suggests that her cancer may fall into this category.    While reassuring, this does not definitively rule out a hereditary predisposition to cancer. It is still possible that there could be genetic mutations that are undetectable by current technology. There could be genetic mutations in genes that have not been tested or identified to increase cancer risk.  Therefore, it is recommended she continue to follow the cancer management and screening guidelines provided by her oncology and primary healthcare provider.   An individual's cancer risk and medical management are not determined by genetic test results alone. Overall cancer risk assessment incorporates additional factors, including personal medical history, family history, and any available genetic information that may result in a personalized plan for cancer prevention and surveillance  RECOMMENDATIONS FOR FAMILY MEMBERS:  Individuals in this family might be at some increased risk of developing cancer, over the general population risk, simply due to the family history of cancer.  We recommended women in this family have a yearly mammogram beginning at age 68, or 19 years younger than the earliest onset of cancer, an  annual clinical breast exam, and perform monthly breast self-exams. Women in this family should also have a gynecological exam as recommended by their primary provider. Family members should be referred for colonoscopy starting at age 23, or earlier, as recommended by their providers.   FOLLOW-UP: Lastly, we discussed with Joanna Osborn that cancer genetics is a rapidly advancing field and it is possible that new genetic tests will be appropriate for her and/or her family  members in the future. We encouraged her to remain in contact with cancer genetics on an annual basis so we can update her personal and family histories and let her know of advances in cancer genetics that may benefit this family.   Our contact number was provided. Joanna Osborn questions were answered to her satisfaction, and she knows she is welcome to call us at anytime with additional questions or concerns.     Blair Lundeen M. Joette Catching, [place], [facility] Genetic Counselor [email or web address] (P) [phone]

## 2021-04-21 ENCOUNTER — Telehealth: Payer: Self-pay

## 2021-04-21 NOTE — Telephone Encounter (Signed)
Notified Patient of completion of Attending Physician Statement. Copy placed at Registration Desk for Pick-up as requested.

## 2021-04-23 ENCOUNTER — Other Ambulatory Visit: Payer: Self-pay

## 2021-04-23 ENCOUNTER — Ambulatory Visit
Admission: RE | Admit: 2021-04-23 | Discharge: 2021-04-23 | Disposition: A | Discharge status: Home / Self Care | Payer: Federal, State, Local not specified - PPO | Source: Ambulatory Visit | Attending: Hematology | Admitting: Hematology

## 2021-04-23 ENCOUNTER — Encounter: Payer: Self-pay | Admitting: *Deleted

## 2021-04-23 ENCOUNTER — Other Ambulatory Visit (HOSPITAL_COMMUNITY): Payer: Self-pay | Admitting: Diagnostic Radiology

## 2021-04-23 DIAGNOSIS — N6313 Unspecified lump in the right breast, lower outer quadrant: Secondary | ICD-10-CM | POA: Diagnosis not present

## 2021-04-23 DIAGNOSIS — N6489 Other specified disorders of breast: Secondary | ICD-10-CM | POA: Diagnosis not present

## 2021-04-23 DIAGNOSIS — C50412 Malignant neoplasm of upper-outer quadrant of left female breast: Secondary | ICD-10-CM

## 2021-04-23 DIAGNOSIS — Z170 Estrogen receptor positive status [ER+]: Secondary | ICD-10-CM

## 2021-04-24 ENCOUNTER — Inpatient Hospital Stay (HOSPITAL_BASED_OUTPATIENT_CLINIC_OR_DEPARTMENT_OTHER): Payer: Federal, State, Local not specified - PPO | Admitting: Hematology

## 2021-04-24 ENCOUNTER — Inpatient Hospital Stay: Payer: Federal, State, Local not specified - PPO

## 2021-04-24 ENCOUNTER — Ambulatory Visit: Payer: Federal, State, Local not specified - PPO

## 2021-04-24 ENCOUNTER — Other Ambulatory Visit: Payer: Self-pay

## 2021-04-24 VITALS — BP 140/79 | HR 79 | Temp 98.1°F | Resp 18 | Ht 65.0 in | Wt 222.1 lb

## 2021-04-24 DIAGNOSIS — Z170 Estrogen receptor positive status [ER+]: Secondary | ICD-10-CM

## 2021-04-24 DIAGNOSIS — C50412 Malignant neoplasm of upper-outer quadrant of left female breast: Secondary | ICD-10-CM | POA: Diagnosis not present

## 2021-04-24 DIAGNOSIS — Z5189 Encounter for other specified aftercare: Secondary | ICD-10-CM | POA: Diagnosis not present

## 2021-04-24 DIAGNOSIS — Z95828 Presence of other vascular implants and grafts: Secondary | ICD-10-CM

## 2021-04-24 DIAGNOSIS — Z5111 Encounter for antineoplastic chemotherapy: Secondary | ICD-10-CM | POA: Diagnosis not present

## 2021-04-24 DIAGNOSIS — Z5112 Encounter for antineoplastic immunotherapy: Secondary | ICD-10-CM | POA: Diagnosis not present

## 2021-04-24 DIAGNOSIS — R110 Nausea: Secondary | ICD-10-CM | POA: Diagnosis not present

## 2021-04-24 LAB — CMP (CANCER CENTER ONLY)
ALT: 26 U/L (ref 0–44)
AST: 16 U/L (ref 15–41)
Albumin: 3.7 g/dL (ref 3.5–5.0)
Alkaline Phosphatase: 52 U/L (ref 38–126)
Anion gap: 11 (ref 5–15)
BUN: 14 mg/dL (ref 6–20)
CO2: 22 mmol/L (ref 22–32)
Calcium: 9.3 mg/dL (ref 8.9–10.3)
Chloride: 111 mmol/L (ref 98–111)
Creatinine: 0.7 mg/dL (ref 0.44–1.00)
GFR, Estimated: >60 mL/min (ref >60)
Glucose, Bld: 110 mg/dL — ABNORMAL HIGH (ref 70–99)
Potassium: 3.4 mmol/L — ABNORMAL LOW (ref 3.5–5.1)
Sodium: 144 mmol/L (ref 135–145)
Total Bilirubin: 0.5 mg/dL (ref 0.3–1.2)
Total Protein: 6.5 g/dL (ref 6.5–8.1)

## 2021-04-24 LAB — CBC WITH DIFFERENTIAL (CANCER CENTER ONLY)
Abs Immature Granulocytes: 0.01 10*3/uL (ref 0.00–0.07)
Basophils Absolute: 0 10*3/uL (ref 0.0–0.1)
Basophils Relative: 1 %
Eosinophils Absolute: 0 10*3/uL (ref 0.0–0.5)
Eosinophils Relative: 0 %
HCT: 30.6 % — ABNORMAL LOW (ref 36.0–46.0)
Hemoglobin: 9.5 g/dL — ABNORMAL LOW (ref 12.0–15.0)
Immature Granulocytes: 0 %
Lymphocytes Relative: 43 %
Lymphs Abs: 1.8 10*3/uL (ref 0.7–4.0)
MCH: 26.2 pg (ref 26.0–34.0)
MCHC: 31 g/dL (ref 30.0–36.0)
MCV: 84.5 fL (ref 80.0–100.0)
Monocytes Absolute: 0.3 10*3/uL (ref 0.1–1.0)
Monocytes Relative: 8 %
Neutro Abs: 1.9 10*3/uL (ref 1.7–7.7)
Neutrophils Relative %: 48 %
Platelet Count: 259 10*3/uL (ref 150–400)
RBC: 3.62 MIL/uL — ABNORMAL LOW (ref 3.87–5.11)
RDW: 16.7 % — ABNORMAL HIGH (ref 11.5–15.5)
WBC Count: 4.1 10*3/uL (ref 4.0–10.5)
nRBC: 0 % (ref 0.0–0.2)

## 2021-04-24 MED ORDER — SODIUM CHLORIDE 0.9% FLUSH
10.0000 mL | Freq: Once | INTRAVENOUS | Status: AC
  Administered 2021-04-24: 10 mL

## 2021-04-24 NOTE — Progress Notes (Signed)
[place]   [phone]) [phone] [phone]   Clinic Follow up Note   Patient Care Team: Lennie Odor, [place] as PCP - General (Physician Assistant) Rockwell Germany, RN as Oncology Nurse Navigator Mauro Kaufmann, RN as Oncology Nurse Navigator Rolm Bookbinder, MD as Consulting Physician (General Surgery) Truitt Merle, MD as Consulting Physician (Hematology) Kyung Rudd, MD as Consulting Physician (Radiation Oncology)  Date of Service:  04/24/2021  CHIEF COMPLAINT: f/u of left breast cancer  CURRENT THERAPY:  Neoadjuvant TCHP (docetaxel, carboplatin, herceptin, perjeta) with GCSF, starting 04/04/21   ASSESSMENT & PLAN:  Joanna Osborn is a 50 y.o. female with   1. 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 Left mammogram on 03/05/21 showed 2.1 cm mass at 12 o'clock. Biopsy confirmed invasive ductal carcinoma, grade 3, weakly ER+ and Her2+ -Given her Her2+ disease, she began neoadjuvant TCHP q3 weeks for 6 cycles, followed by Herceptin w or wo Perjeta maintenance therapy for a total of one year.   -Breast MRI 03/26/21 showed additional areas of concern-- a left breast/axilla lymph node, and a right breast mass. She is scheduled for second-look ultrasounds and possible biopsies on 04/14/21. If she has positive node disease, we will refer her for staging work-up -echo from 04/02/21 reviewed, no concerns  -She began neoadjuvant [facility] 04/03/2021 with G-CSF. She tolerated with diarrhea and fatigue. She received fluids on day 7 last cycle. I will schedule for her to receive IVF on Saturday (day 6) of next cycle. -she underwent additional right breast biopsy yesterday, 04/23/21. I spoke with the pathologist who did not see any malignancy.  2. Symptom Management: diarrhea, fatigue, UTI -she tolerated C1 TCHP relatively well, but experienced diarrhea and fatigue. -I  prescribed lomotil for her on 04/10/21 for her to use with this cycle. -she also developed a UTI and was prescribed Cipro on 04/12/21. She has recovered well.   3. Anxiety and depression, HTN -f/u PCP -continue HCTZ, will monitor her blood pressure closely during chemotherapy.  Advised her to hold if she is not drinking well or feels dizzy -Social work follow-up   4. Mild Anemia -her hgb has dropped recently secondary to chemo  -she still has periods, most recent cycle began 04/06/2021, bleeding is light -She began oral iron, will check her iron level next time  -hgb today is 9.5. We will monitor. I discussed blood transfusion may be needed if this worsens.   PLAN: -proceed with C2 TCHP as scheduled on 04/28/21  -nutrition will see her during infusion  -G-CSF on 04/30/21 -lab, flush, f/u, and C3 TCHP as scheduled on 05/19/21   No problem-specific Assessment & Plan notes found for this encounter.   SUMMARY OF ONCOLOGIC HISTORY: Oncology History Overview Note  Cancer Staging Malignant neoplasm of upper-outer quadrant of left breast in female, estrogen receptor positive ([place]) Staging form: Breast, AJCC 8th Edition - Clinical stage from 03/07/2021: Stage IB (cT2, cN0, cM0, G3, ER+, PR+, HER2+) - Signed by Truitt Merle, MD on 03/17/2021 Stage prefix: Initial diagnosis Histologic grading system: 3 grade system    Malignant neoplasm of upper-outer quadrant of left breast in female, estrogen receptor positive ([place])  03/05/2021 Mammogram   Diagnostic Left Mammogram; Left Breast Ultrasound  IMPRESSION: At the palpable site of concern in the left breast at 12 o'clock there is a suspicious mass measuring 2.1 cm.   03/07/2021 Cancer Staging  Staging form: Breast, AJCC 8th Edition - Clinical stage from 03/07/2021: Stage IB (cT2, cN0, cM0, G3, ER+, PR+, HER2+) - Signed by Truitt Merle, MD on 03/17/2021 Stage prefix: Initial diagnosis Histologic grading system: 3 grade system   03/07/2021 Pathology  Results   Diagnosis Breast, left, needle core biopsy, left breast 12 o' clock - INVASIVE DUCTAL CARCINOMA - SEE COMMENT Based on the biopsy, the carcinoma appears Nottingham grade 3 of 3  PROGNOSTIC INDICATORS Results: IMMUNOHISTOCHEMICAL AND MORPHOMETRIC ANALYSIS PERFORMED MANUALLY The tumor cells are POSITIVE for Her2 (3+). Estrogen Receptor: 30%, POSITIVE, WEAK STAINING INTENSITY Progesterone Receptor: 15%, POSITIVE, MODERATE STAINING INTENSITY Proliferation Marker Ki67: 40%     03/13/2021 Initial Diagnosis   Malignant neoplasm of upper-outer quadrant of left breast in female, estrogen receptor positive ([place])   03/26/2021 Imaging   MRI Breast  IMPRESSION: 1. 2.5 cm biopsy-proven malignancy within the UPPER LEFT breast. 2. Abnormal lymph node with focal cortical thickening in the posterior UPPER-OUTER LEFT breast/LOWER LEFT axilla. 2nd-look ultrasound with possible biopsy is recommended. 3. Indeterminate 0.8 cm OUTER RIGHT breast mass. 2nd-look ultrasound with possible biopsy is recommended.   03/29/2021 Genetic Testing   Negative hereditary cancer genetic testing: no pathogenic variants detected in [facility] CustomNext-Cancer +RNAinsight Panel.  The report date is March 29, 2021.   The CustomNext-Cancer+RNAinsight panel offered by [facility] includes sequencing and rearrangement analysis for the following 47 genes:  APC, ATM, AXIN2, BARD1, BMPR1A, BRCA1, BRCA2, BRIP1, CDH1, CDK4, CDKN2A, CHEK2, DICER1, EPCAM, GREM1, HOXB13, MEN1, MLH1, MSH2, MSH3, MSH6, MUTYH, NBN, NF1, NF2, NTHL1, PALB2, PMS2, POLD1, POLE, PTEN, RAD51C, RAD51D, RECQL, RET, SDHA, SDHAF2, SDHB, SDHC, SDHD, SMAD4, SMARCA4, STK11, TP53, TSC1, TSC2, and VHL.  RNA data is routinely analyzed for use in variant interpretation for all genes.   04/04/2021 -  Chemotherapy   Patient is on Treatment Plan : BREAST  Docetaxel + Carboplatin + Trastuzumab + Pertuzumab  (TCHP) q21d      04/14/2021 Imaging   [place] Bilateral  Breast  IMPRESSION: 1. No sonographic correlate for the 8 mm enhancing mass in the outer right breast. Recommendation is to prior seed with MRI guided biopsy. 2. An undulating, low lying lymph node along the 1 o'clock axis of the left breast demonstrates no areas of cortical thickening beyond 3 mm.      INTERVAL HISTORY:  Joanna Osborn is here for a follow up of breast cancer. She was last seen by me on 04/03/21 and by NP Lacie in the interim. She presents to the clinic alone. She reports she has mostly recovered from her first cycle of chemo. She reports her diarrhea has improved and she is able to eat. She notes her energy has improved, at 80% today. She feels her breast mass is smaller.   All other systems were reviewed with the patient and are negative.  MEDICAL HISTORY:  Past Medical History:  Diagnosis Date   Anxiety    Breast cancer ([place])    Depression    Family history of colon cancer 03/19/2021   History of kidney stones    Hypertension     SURGICAL HISTORY: Past Surgical History:  Procedure Laterality Date   CHOLECYSTECTOMY  2002   INSERTION OF MESH N/A 10/28/2016   Procedure: INSERTION OF MESH;  Surgeon: Clovis [name], MD;  Location: [place];  Service: General;  Laterality: N/A;   PORTACATH PLACEMENT Right 04/02/2021   Procedure: INSERTION PORT-A-CATH;  Surgeon: Rolm Bookbinder, MD;  Location: [place];  Service: General;  Laterality: Right;   TUBAL LIGATION  1999   VENTRAL HERNIA REPAIR N/A 10/28/2016   Procedure: LAPAROSCOPIC VENTRAL HERNIA REPAIR;  Surgeon: Clovis [name], MD;  Location: [place];  Service: General;  Laterality: N/A;   WRIST SURGERY      I have reviewed the social history and family history with the patient and they are unchanged from previous note.  ALLERGIES:  is allergic to no known allergies.  MEDICATIONS:  Current Outpatient Medications  Medication Sig Dispense Refill   ciprofloxacin (CIPRO) 500 MG tablet Take 1 tablet  (500 mg total) by mouth 2 (two) times daily. 14 tablet 0   dexamethasone (DECADRON) 4 MG tablet Take 1 tablet (4 mg total) by mouth 2 (two) times daily. Start the day before Taxotere. Then take daily x 3 days after chemotherapy. 30 tablet 1   diphenoxylate-atropine (LOMOTIL) 2.5-0.025 MG tablet Take 1-2 tablets by mouth 4 (four) times daily as needed for diarrhea or loose stools. 30 tablet 1   hydrochlorothiazide (HYDRODIURIL) 25 MG tablet Take 25 mg by mouth daily.     lidocaine-prilocaine (EMLA) cream Apply to affected area once 30 g 3   ondansetron (ZOFRAN) 8 MG tablet Take 1 tablet (8 mg total) by mouth 2 (two) times daily as needed (Nausea or vomiting). Start on the third day after chemotherapy. 30 tablet 1   prochlorperazine (COMPAZINE) 10 MG tablet Take 1 tablet (10 mg total) by mouth every 6 (six) hours as needed (Nausea or vomiting). 30 tablet 1   traMADol (ULTRAM) 50 MG tablet Take 2 tablets (100 mg total) by mouth every 6 (six) hours as needed. 6 tablet 0   No current facility-administered medications for this visit.    PHYSICAL EXAMINATION: ECOG PERFORMANCE STATUS: 1 - Symptomatic but completely ambulatory  Vitals:   04/24/21 0930  BP: 140/79  Pulse: 79  Resp: 18  Temp: 98.1 F (36.7 C)  SpO2: 100%   Wt Readings from Last 3 Encounters:  04/24/21 222 lb 1.6 oz (100.7 kg)  04/09/21 217 lb 6.4 oz (98.6 kg)  04/03/21 228 lb 3.2 oz (103.5 kg)     GENERAL:alert, no distress and comfortable SKIN: skin color normal, no rashes or significant lesions EYES: normal, Conjunctiva are pink and non-injected, sclera clear  NEURO: alert & oriented x 3 with fluent speech  LABORATORY DATA:  I have reviewed the data as listed CBC Latest Ref Rng & Units 04/24/2021 04/09/2021 04/03/2021  WBC 4.0 - 10.5 K/uL 4.1 10.9(H) 14.6(H)  Hemoglobin 12.0 - 15.0 g/dL 9.5(L) 11.5(L) 10.5(L)  Hematocrit 36.0 - 46.0 % 30.6(L) 35.4(L) 33.1(L)  Platelets 150 - 400 K/uL 259 416(H) 482(H)     CMP Latest  Ref Rng & Units 04/24/2021 04/09/2021 04/03/2021  Glucose 70 - 99 mg/dL 110(H) 130(H) 102(H)  BUN 6 - 20 mg/dL _0 Creatinine 0.44 - 1.00 mg/dL 0.70 0.82 0.80  Sodium 135 - 145 mmol/L 144 135 142  Potassium 3.5 - 5.1 mmol/L 3.4(L) 3.6 4.0  Chloride 98 - 111 mmol/L 111 99 112(H)  CO2 22 - 32 mmol/L 22 23 19(L)  Calcium 8.9 - 10.3 mg/dL 9.3 9.2 9.6  Total Protein 6.5 - 8.1 g/dL 6.5 7.2 7.0  Total Bilirubin 0.3 - 1.2 mg/dL 0.5 1.2 0.6  Alkaline Phos 38 - 126 U/L 52 61 56  AST 15 - 41 U/L 16 18 37  ALT 0 - 44 U/L 26 23 56(H)      RADIOGRAPHIC STUDIES: I  have personally reviewed the radiological images as listed and agreed with the findings in the report. MM CLIP PLACEMENT RIGHT  Result Date: 04/23/2021 CLINICAL DATA:  Status post MR guided core biopsy of mass in the RIGHT breast. EXAM: 3D DIAGNOSTIC RIGHT MAMMOGRAM POST MRI BIOPSY COMPARISON:  Previous exam(s). FINDINGS: 3D Mammographic images were obtained following MRI guided biopsy of biopsy of mass in the LOWER OUTER QUADRANT of the RIGHT breast and placement of a barbell shaped clip. The biopsy marking clip is in expected position at the site of biopsy. IMPRESSION: Appropriate positioning of the barbell shaped biopsy marking clip at the site of biopsy in the LOWER OUTER QUADRANT RIGHT breast. Final Assessment: Post Procedure Mammograms for Marker Placement Electronically Signed   By: Nolon Nations M.D.   On: 04/23/2021 10:22  MR RT BREAST BX W LOC DEV 1ST LESION IMAGE BX SPEC MR GUIDE  Result Date: 04/23/2021 CLINICAL DATA:  Patient presents for MR guided core biopsy of RIGHT breast mass. EXAM: MRI GUIDED CORE NEEDLE BIOPSY OF THE RIGHT BREAST TECHNIQUE: Multiplanar, multisequence MR imaging of the RIGHT breast was performed both before and after administration of intravenous contrast. CONTRAST:  9 ml Gadavist COMPARISON:  Previous exams. FINDINGS: I met with the patient, and we discussed the procedure of MRI guided biopsy, including  risks, benefits, and alternatives. Specifically, we discussed the risks of infection, bleeding, tissue injury, clip migration, and inadequate sampling. Informed, written consent was given. The usual time out protocol was performed immediately prior to the procedure. Using sterile technique, 1% Lidocaine, MRI guidance, and a 9 gauge vacuum assisted device, biopsy was performed of mass in the LOWER OUTER QUADRANT of the RIGHT breast using a LATERAL to MEDIAL approach. At the conclusion of the procedure, a barbell shaped tissue marker clip was deployed into the biopsy cavity. Follow-up 2-view mammogram was performed and dictated separately. IMPRESSION: MRI guided biopsy of RIGHT breast mass.  No apparent complications. Electronically Signed   By: Nolon Nations M.D.   On: 04/23/2021 10:18      No orders of the defined types were placed in this encounter.  All questions were answered. The patient knows to call the clinic with any problems, questions or concerns. No barriers to learning was detected. The total time spent in the appointment was 30 minutes.     Truitt Merle, MD 04/24/2021   I, Wilburn Mylar, am acting as scribe for Truitt Merle, MD.   I have reviewed the above documentation for accuracy and completeness, and I agree with the above.

## 2021-04-25 ENCOUNTER — Encounter: Payer: Self-pay | Admitting: *Deleted

## 2021-04-25 MED FILL — Fosaprepitant Dimeglumine For IV Infusion 150 MG (Base Eq): INTRAVENOUS | Qty: 5 | Status: AC

## 2021-04-25 MED FILL — Dexamethasone Sodium Phosphate Inj 100 MG/10ML: INTRAMUSCULAR | Qty: 1 | Status: AC

## 2021-04-26 ENCOUNTER — Ambulatory Visit: Payer: Federal, State, Local not specified - PPO

## 2021-04-28 ENCOUNTER — Inpatient Hospital Stay: Payer: Federal, State, Local not specified - PPO

## 2021-04-28 ENCOUNTER — Inpatient Hospital Stay: Payer: Federal, State, Local not specified - PPO | Attending: Hematology

## 2021-04-28 ENCOUNTER — Other Ambulatory Visit: Payer: Self-pay

## 2021-04-28 VITALS — BP 142/72 | HR 72 | Temp 98.2°F | Resp 18

## 2021-04-28 DIAGNOSIS — Z5112 Encounter for antineoplastic immunotherapy: Secondary | ICD-10-CM | POA: Insufficient documentation

## 2021-04-28 DIAGNOSIS — C50412 Malignant neoplasm of upper-outer quadrant of left female breast: Secondary | ICD-10-CM | POA: Diagnosis not present

## 2021-04-28 DIAGNOSIS — Z5189 Encounter for other specified aftercare: Secondary | ICD-10-CM | POA: Diagnosis not present

## 2021-04-28 DIAGNOSIS — Z5111 Encounter for antineoplastic chemotherapy: Secondary | ICD-10-CM | POA: Insufficient documentation

## 2021-04-28 DIAGNOSIS — Z170 Estrogen receptor positive status [ER+]: Secondary | ICD-10-CM | POA: Insufficient documentation

## 2021-04-28 MED ORDER — SODIUM CHLORIDE 0.9 % IV SOLN
75.0000 mg/m2 | Freq: Once | INTRAVENOUS | Status: AC
  Administered 2021-04-28: 160 mg via INTRAVENOUS
  Filled 2021-04-28: qty 16

## 2021-04-28 MED ORDER — DIPHENHYDRAMINE HCL 25 MG PO CAPS
50.0000 mg | ORAL_CAPSULE | Freq: Once | ORAL | Status: AC
  Administered 2021-04-28: 50 mg via ORAL
  Filled 2021-04-28: qty 2

## 2021-04-28 MED ORDER — ACETAMINOPHEN 325 MG PO TABS
650.0000 mg | ORAL_TABLET | Freq: Once | ORAL | Status: AC
  Administered 2021-04-28: 650 mg via ORAL
  Filled 2021-04-28: qty 2

## 2021-04-28 MED ORDER — TRASTUZUMAB-DKST CHEMO 150 MG IV SOLR
600.0000 mg | Freq: Once | INTRAVENOUS | Status: AC
  Administered 2021-04-28: 600 mg via INTRAVENOUS
  Filled 2021-04-28: qty 28.57

## 2021-04-28 MED ORDER — PALONOSETRON HCL INJECTION 0.25 MG/5ML
0.2500 mg | Freq: Once | INTRAVENOUS | Status: AC
  Administered 2021-04-28: 0.25 mg via INTRAVENOUS
  Filled 2021-04-28: qty 5

## 2021-04-28 MED ORDER — SODIUM CHLORIDE 0.9 % IV SOLN
900.0000 mg | Freq: Once | INTRAVENOUS | Status: AC
  Administered 2021-04-28: 900 mg via INTRAVENOUS
  Filled 2021-04-28: qty 90

## 2021-04-28 MED ORDER — SODIUM CHLORIDE 0.9 % IV SOLN
150.0000 mg | Freq: Once | INTRAVENOUS | Status: AC
  Administered 2021-04-28: 150 mg via INTRAVENOUS
  Filled 2021-04-28: qty 150

## 2021-04-28 MED ORDER — SODIUM CHLORIDE 0.9 % IV SOLN: Freq: Once | INTRAVENOUS | Status: AC

## 2021-04-28 MED ORDER — SODIUM CHLORIDE 0.9 % IV SOLN
420.0000 mg | Freq: Once | INTRAVENOUS | Status: AC
Start: 2021-04-28 — End: 2021-04-28
  Administered 2021-04-28: 420 mg via INTRAVENOUS
  Filled 2021-04-28: qty 14

## 2021-04-28 MED ORDER — SODIUM CHLORIDE 0.9 % IV SOLN
10.0000 mg | Freq: Once | INTRAVENOUS | Status: AC
  Administered 2021-04-28: 10 mg via INTRAVENOUS
  Filled 2021-04-28: qty 10

## 2021-04-28 NOTE — Progress Notes (Signed)
Nutrition Follow-up:    Patient with breast cancer receiving carbo/taxol, herceptin, perjeta.  Patient followed by Dr Burr Medico.  Met with patient during infusion.  Reports that appetite is a little bit better.  Having less diarrhea, some nausea.  Breakfast she is drinking kachava shake. Lunch she may or may not eat depending on how she is feeling.  Dinner is salmon, cauliflower rice or quinoa, sometimes salad if not having diarrhea.  Sometimes french fries.  Says that she is having increased reflux    Medications: reviewed  Labs: reviewed  Anthropometrics:   Weight 222 lb 1.6 oz on 9/29  217 lb on 9/15 UBW of 227 lb August 2022   NUTRITION DIAGNOSIS: Unintentional weight loss stable   INTERVENTION:  Reviewed foods that can increase diarrhea.  Reviewed food strategies to help with reflux. Reviewed protein foods. Patient denies additional questions    MONITORING, EVALUATION, GOAL: weight trends, intake   NEXT VISIT: Monday, Oct 24 during infusion  Aldahir Litaker B. Zenia Resides, [place], [place] Registered Dietitian [phone] (mobile)

## 2021-04-30 ENCOUNTER — Other Ambulatory Visit: Payer: Self-pay

## 2021-04-30 ENCOUNTER — Inpatient Hospital Stay: Payer: Federal, State, Local not specified - PPO

## 2021-04-30 VITALS — BP 142/97 | HR 89 | Temp 98.5°F | Resp 16

## 2021-04-30 DIAGNOSIS — Z5189 Encounter for other specified aftercare: Secondary | ICD-10-CM | POA: Diagnosis not present

## 2021-04-30 DIAGNOSIS — C50412 Malignant neoplasm of upper-outer quadrant of left female breast: Secondary | ICD-10-CM | POA: Diagnosis not present

## 2021-04-30 DIAGNOSIS — Z5111 Encounter for antineoplastic chemotherapy: Secondary | ICD-10-CM | POA: Diagnosis not present

## 2021-04-30 DIAGNOSIS — Z5112 Encounter for antineoplastic immunotherapy: Secondary | ICD-10-CM | POA: Diagnosis not present

## 2021-04-30 DIAGNOSIS — Z170 Estrogen receptor positive status [ER+]: Secondary | ICD-10-CM | POA: Diagnosis not present

## 2021-04-30 MED ORDER — PEGFILGRASTIM-CBQV 6 MG/0.6ML ~~LOC~~ SOSY
6.0000 mg | PREFILLED_SYRINGE | Freq: Once | SUBCUTANEOUS | Status: AC
  Administered 2021-04-30: 6 mg via SUBCUTANEOUS
  Filled 2021-04-30: qty 0.6

## 2021-04-30 NOTE — Patient Instructions (Signed)

## 2021-05-03 ENCOUNTER — Inpatient Hospital Stay: Payer: Federal, State, Local not specified - PPO

## 2021-05-03 ENCOUNTER — Other Ambulatory Visit: Payer: Self-pay

## 2021-05-03 VITALS — BP 101/74 | HR 112 | Temp 97.0°F | Resp 20

## 2021-05-03 DIAGNOSIS — Z5111 Encounter for antineoplastic chemotherapy: Secondary | ICD-10-CM | POA: Diagnosis not present

## 2021-05-03 DIAGNOSIS — C50412 Malignant neoplasm of upper-outer quadrant of left female breast: Secondary | ICD-10-CM | POA: Diagnosis not present

## 2021-05-03 DIAGNOSIS — Z5112 Encounter for antineoplastic immunotherapy: Secondary | ICD-10-CM | POA: Diagnosis not present

## 2021-05-03 DIAGNOSIS — Z95828 Presence of other vascular implants and grafts: Secondary | ICD-10-CM

## 2021-05-03 DIAGNOSIS — Z170 Estrogen receptor positive status [ER+]: Secondary | ICD-10-CM | POA: Diagnosis not present

## 2021-05-03 DIAGNOSIS — Z5189 Encounter for other specified aftercare: Secondary | ICD-10-CM | POA: Diagnosis not present

## 2021-05-03 MED ORDER — HEPARIN SOD (PORK) LOCK FLUSH 100 UNIT/ML IV SOLN
500.0000 [IU] | Freq: Once | INTRAVENOUS | Status: AC
  Administered 2021-05-03: 500 [IU] via INTRAVENOUS

## 2021-05-03 MED ORDER — SODIUM CHLORIDE 0.9% FLUSH
10.0000 mL | Freq: Once | INTRAVENOUS | Status: AC | PRN
  Administered 2021-05-03: 10 mL

## 2021-05-03 MED ORDER — HEPARIN SOD (PORK) LOCK FLUSH 100 UNIT/ML IV SOLN: 250.0000 [IU] | Freq: Once | INTRAVENOUS | Status: DC | PRN

## 2021-05-03 MED ORDER — SODIUM CHLORIDE 0.9 % IV SOLN: Freq: Once | INTRAVENOUS | Status: AC

## 2021-05-03 NOTE — Patient Instructions (Signed)
[facility]  Discharge Instructions: Thank you for choosing [facility] to provide your oncology and hematology care.   If you have a lab appointment with the Cancer Center, please go directly to the Cancer Center and check in at the registration area.   Wear comfortable clothing and clothing appropriate for easy access to any Portacath or PICC line.   We strive to give you quality time with your provider. You may need to reschedule your appointment if you arrive late (15 or more minutes).  Arriving late affects you and other patients whose appointments are after yours.  Also, if you miss three or more appointments without notifying the office, you may be dismissed from the clinic at the provider's discretion.      For prescription refill requests, have your pharmacy contact our office and allow 72 hours for refills to be completed.       To help prevent nausea and vomiting after your treatment, we encourage you to take your nausea medication as directed.  BELOW ARE SYMPTOMS THAT SHOULD BE REPORTED IMMEDIATELY: *FEVER GREATER THAN 100.4 F (38 C) OR HIGHER *CHILLS OR SWEATING *NAUSEA AND VOMITING THAT IS NOT CONTROLLED WITH YOUR NAUSEA MEDICATION *UNUSUAL SHORTNESS OF BREATH *UNUSUAL BRUISING OR BLEEDING *URINARY PROBLEMS (pain or burning when urinating, or frequent urination) *BOWEL PROBLEMS (unusual diarrhea, constipation, pain near the anus) TENDERNESS IN MOUTH AND THROAT WITH OR WITHOUT PRESENCE OF ULCERS (sore throat, sores in mouth, or a toothache) UNUSUAL RASH, SWELLING OR PAIN  UNUSUAL VAGINAL DISCHARGE OR ITCHING   Items with * indicate a potential emergency and should be followed up as soon as possible or go to the Emergency Department if any problems should occur.  Please show the CHEMOTHERAPY ALERT CARD or IMMUNOTHERAPY ALERT CARD at check-in to the Emergency Department and triage nurse.  Should you have questions after your  visit or need to cancel or reschedule your appointment, please contact [facility]  Dept: [phone]  and follow the prompts.  Office hours are 8:00 a.m. to 4:30 p.m. Monday - Friday. Please note that voicemails left after 4:00 p.m. may not be returned until the following business day.  We are closed weekends and major holidays. You have access to a nurse at all times for urgent questions. Please call the main number to the clinic Dept: [phone] and follow the prompts.   For any non-urgent questions, you may also contact your provider using MyChart. We now offer e-Visits for anyone 18 and older to request care online for non-urgent symptoms. For details visit [email or web address].   Also download the MyChart app! Go to the app store, search "MyChart", open the app, select [facility], and log in with your MyChart username and password.  Due to Covid, a mask is required upon entering the hospital/clinic. If you do not have a mask, one will be given to you upon arrival. For doctor visits, patients may have 1 support person aged 18 or older with them. For treatment visits, patients cannot have anyone with them due to current Covid guidelines and our immunocompromised population.   

## 2021-05-13 ENCOUNTER — Other Ambulatory Visit: Payer: Self-pay

## 2021-05-13 ENCOUNTER — Inpatient Hospital Stay (HOSPITAL_BASED_OUTPATIENT_CLINIC_OR_DEPARTMENT_OTHER): Payer: Federal, State, Local not specified - PPO

## 2021-05-13 DIAGNOSIS — Z23 Encounter for immunization: Secondary | ICD-10-CM

## 2021-05-15 ENCOUNTER — Other Ambulatory Visit: Payer: Federal, State, Local not specified - PPO

## 2021-05-15 ENCOUNTER — Ambulatory Visit: Payer: Federal, State, Local not specified - PPO

## 2021-05-15 ENCOUNTER — Ambulatory Visit: Payer: Federal, State, Local not specified - PPO | Admitting: Hematology

## 2021-05-16 MED FILL — Fosaprepitant Dimeglumine For IV Infusion 150 MG (Base Eq): INTRAVENOUS | Qty: 5 | Status: AC

## 2021-05-16 MED FILL — Dexamethasone Sodium Phosphate Inj 100 MG/10ML: INTRAMUSCULAR | Qty: 1 | Status: AC

## 2021-05-17 ENCOUNTER — Ambulatory Visit: Payer: Federal, State, Local not specified - PPO

## 2021-05-19 ENCOUNTER — Inpatient Hospital Stay: Payer: Federal, State, Local not specified - PPO

## 2021-05-19 ENCOUNTER — Encounter: Payer: Self-pay | Admitting: *Deleted

## 2021-05-19 ENCOUNTER — Encounter: Payer: Self-pay | Admitting: Hematology

## 2021-05-19 ENCOUNTER — Other Ambulatory Visit: Payer: Self-pay

## 2021-05-19 ENCOUNTER — Inpatient Hospital Stay (HOSPITAL_BASED_OUTPATIENT_CLINIC_OR_DEPARTMENT_OTHER): Payer: Federal, State, Local not specified - PPO | Admitting: Hematology

## 2021-05-19 VITALS — BP 128/92 | HR 87 | Temp 98.7°F | Resp 17 | Ht 65.0 in | Wt 214.8 lb

## 2021-05-19 DIAGNOSIS — Z170 Estrogen receptor positive status [ER+]: Secondary | ICD-10-CM

## 2021-05-19 DIAGNOSIS — C50412 Malignant neoplasm of upper-outer quadrant of left female breast: Secondary | ICD-10-CM

## 2021-05-19 DIAGNOSIS — Z5112 Encounter for antineoplastic immunotherapy: Secondary | ICD-10-CM | POA: Diagnosis not present

## 2021-05-19 DIAGNOSIS — Z95828 Presence of other vascular implants and grafts: Secondary | ICD-10-CM

## 2021-05-19 DIAGNOSIS — Z5189 Encounter for other specified aftercare: Secondary | ICD-10-CM | POA: Diagnosis not present

## 2021-05-19 DIAGNOSIS — Z5111 Encounter for antineoplastic chemotherapy: Secondary | ICD-10-CM | POA: Diagnosis not present

## 2021-05-19 LAB — CBC WITH DIFFERENTIAL (CANCER CENTER ONLY)
Abs Immature Granulocytes: 0.01 10*3/uL (ref 0.00–0.07)
Basophils Absolute: 0 10*3/uL (ref 0.0–0.1)
Basophils Relative: 1 %
Eosinophils Absolute: 0 10*3/uL (ref 0.0–0.5)
Eosinophils Relative: 1 %
HCT: 30.2 % — ABNORMAL LOW (ref 36.0–46.0)
Hemoglobin: 9.6 g/dL — ABNORMAL LOW (ref 12.0–15.0)
Immature Granulocytes: 0 %
Lymphocytes Relative: 50 %
Lymphs Abs: 2.1 10*3/uL (ref 0.7–4.0)
MCH: 27.5 pg (ref 26.0–34.0)
MCHC: 31.8 g/dL (ref 30.0–36.0)
MCV: 86.5 fL (ref 80.0–100.0)
Monocytes Absolute: 0.3 10*3/uL (ref 0.1–1.0)
Monocytes Relative: 8 %
Neutro Abs: 1.7 10*3/uL (ref 1.7–7.7)
Neutrophils Relative %: 40 %
Platelet Count: 222 10*3/uL (ref 150–400)
RBC: 3.49 MIL/uL — ABNORMAL LOW (ref 3.87–5.11)
RDW: 18.8 % — ABNORMAL HIGH (ref 11.5–15.5)
WBC Count: 4.2 10*3/uL (ref 4.0–10.5)
nRBC: 0 % (ref 0.0–0.2)

## 2021-05-19 LAB — CMP (CANCER CENTER ONLY)
ALT: 23 U/L (ref 0–44)
AST: 23 U/L (ref 15–41)
Albumin: 3.8 g/dL (ref 3.5–5.0)
Alkaline Phosphatase: 50 U/L (ref 38–126)
Anion gap: 8 (ref 5–15)
BUN: 16 mg/dL (ref 6–20)
CO2: 28 mmol/L (ref 22–32)
Calcium: 9.2 mg/dL (ref 8.9–10.3)
Chloride: 103 mmol/L (ref 98–111)
Creatinine: 0.68 mg/dL (ref 0.44–1.00)
GFR, Estimated: >60 mL/min (ref >60)
Glucose, Bld: 114 mg/dL — ABNORMAL HIGH (ref 70–99)
Potassium: 3.4 mmol/L — ABNORMAL LOW (ref 3.5–5.1)
Sodium: 139 mmol/L (ref 135–145)
Total Bilirubin: 0.6 mg/dL (ref 0.3–1.2)
Total Protein: 6.9 g/dL (ref 6.5–8.1)

## 2021-05-19 MED ORDER — HEPARIN SOD (PORK) LOCK FLUSH 100 UNIT/ML IV SOLN
500.0000 [IU] | Freq: Once | INTRAVENOUS | Status: AC | PRN
  Administered 2021-05-19: 500 [IU]

## 2021-05-19 MED ORDER — PALONOSETRON HCL INJECTION 0.25 MG/5ML
0.2500 mg | Freq: Once | INTRAVENOUS | Status: AC
  Administered 2021-05-19: 0.25 mg via INTRAVENOUS
  Filled 2021-05-19: qty 5

## 2021-05-19 MED ORDER — SODIUM CHLORIDE 0.9% FLUSH
10.0000 mL | Freq: Once | INTRAVENOUS | Status: AC
  Administered 2021-05-19: 10 mL

## 2021-05-19 MED ORDER — SODIUM CHLORIDE 0.9 % IV SOLN
Freq: Once | INTRAVENOUS | Status: AC
Start: 2021-05-19 — End: 2021-05-19

## 2021-05-19 MED ORDER — SODIUM CHLORIDE 0.9 % IV SOLN
75.0000 mg/m2 | Freq: Once | INTRAVENOUS | Status: AC
  Administered 2021-05-19: 160 mg via INTRAVENOUS
  Filled 2021-05-19: qty 16

## 2021-05-19 MED ORDER — SODIUM CHLORIDE 0.9% FLUSH
10.0000 mL | INTRAVENOUS | Status: DC | PRN
  Administered 2021-05-19: 10 mL

## 2021-05-19 MED ORDER — ACETAMINOPHEN 325 MG PO TABS
650.0000 mg | ORAL_TABLET | Freq: Once | ORAL | Status: AC
  Administered 2021-05-19: 650 mg via ORAL
  Filled 2021-05-19: qty 2

## 2021-05-19 MED ORDER — DIPHENHYDRAMINE HCL 25 MG PO CAPS
50.0000 mg | ORAL_CAPSULE | Freq: Once | ORAL | Status: AC
  Administered 2021-05-19: 50 mg via ORAL
  Filled 2021-05-19: qty 2

## 2021-05-19 MED ORDER — SODIUM CHLORIDE 0.9 % IV SOLN
10.0000 mg | Freq: Once | INTRAVENOUS | Status: AC
  Administered 2021-05-19: 10 mg via INTRAVENOUS
  Filled 2021-05-19: qty 10

## 2021-05-19 MED ORDER — SODIUM CHLORIDE 0.9 % IV SOLN
420.0000 mg | Freq: Once | INTRAVENOUS | Status: AC
  Administered 2021-05-19: 420 mg via INTRAVENOUS
  Filled 2021-05-19: qty 14

## 2021-05-19 MED ORDER — SODIUM CHLORIDE 0.9 % IV SOLN
150.0000 mg | Freq: Once | INTRAVENOUS | Status: AC
  Administered 2021-05-19: 150 mg via INTRAVENOUS
  Filled 2021-05-19: qty 150

## 2021-05-19 MED ORDER — TRASTUZUMAB-DKST CHEMO 150 MG IV SOLR
600.0000 mg | Freq: Once | INTRAVENOUS | Status: AC
  Administered 2021-05-19: 600 mg via INTRAVENOUS
  Filled 2021-05-19: qty 28.57

## 2021-05-19 MED ORDER — SODIUM CHLORIDE 0.9 % IV SOLN
900.0000 mg | Freq: Once | INTRAVENOUS | Status: AC
  Administered 2021-05-19: 900 mg via INTRAVENOUS
  Filled 2021-05-19: qty 90

## 2021-05-19 MED ORDER — DIPHENOXYLATE-ATROPINE 2.5-0.025 MG PO TABS: 2.0000 | ORAL_TABLET | Freq: Four times a day (QID) | ORAL | 1 refills | Status: DC | PRN

## 2021-05-19 NOTE — Progress Notes (Signed)
Nutrition Follow-up:  Patient with breast cancer receiving carbo/taxol, herceptin, perjeta.  Patient followed by Dr Burr Medico.  Met with patient during infusion.  Patient reports that appetite is so-so.  Drinking ensure high protein shakes and kachava shakes.  Says that diarrhea is worse right after treatment. Drinks pedialyte but taste really salty.  She is having taste alterations especially with salty foods.  She is able to eat [place] and cheese sandwiches and broccoli and cheddar soup recently.      Medications: reviewed  Labs: K 3.4  Anthropometrics:   Weight 214 lb 12.8 oz   222 lb 1.6 oz on 9/29 217 lb on 9/15 UBW of 227 lb on Aug 2022   NUTRITION DIAGNOSIS: Unintentional weight loss continues   INTERVENTION:  Discussed strategies to help with taste alterations. Handout provided   MONITORING, EVALUATION, GOAL: Weight trends, intake   NEXT VISIT: to be determined with treatment  Joanna Osborn B. Zenia Resides, [place], [place] Registered Dietitian [phone] (mobile)

## 2021-05-19 NOTE — Progress Notes (Signed)
[facility]   [phone] [phone]   Clinic Follow up Note   Patient Care Team: Lennie Odor, [place] as PCP - General (Physician Assistant) Rockwell Germany, RN as Oncology Nurse Navigator Mauro Kaufmann, RN as Oncology Nurse Navigator Rolm Bookbinder, MD as Consulting Physician (General Surgery) Truitt Merle, MD as Consulting Physician (Hematology) Kyung Rudd, MD as Consulting Physician (Radiation Oncology)  Date of Service:  05/19/2021  CHIEF COMPLAINT: f/u of left breast cancer  CURRENT THERAPY:  Neoadjuvant TCHP (docetaxel, carboplatin, herceptin, perjeta) with GCSF, starting 04/04/21  ASSESSMENT & PLAN:  Joanna Osborn is a 50 y.o. female with   1. 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 Left mammogram on 03/05/21 showed 2.1 cm mass at 12 o'clock. Biopsy confirmed invasive ductal carcinoma, grade 3, weakly ER+ and Her2+ -Given her Her2+ disease, she began neoadjuvant TCHP q3 weeks for 6 cycles, followed by Herceptin w or wo Perjeta maintenance therapy for a total of one year.   -Breast MRI 03/26/21 showed additional areas of concern-- a left breast/axilla lymph node, and a right breast mass. She is scheduled for second-look ultrasounds and possible biopsies on 04/14/21. If she has positive node disease, we will refer her for staging work-up -echo from 04/02/21 reviewed, no concerns  -She began neoadjuvant [facility] 04/03/21 with G-CSF. She is also receiving IVF on day 6 of each cycle. She tolerates moderately well with diarrhea and fatigue.   2. Symptom Management: diarrhea, fatigue, UTI -she tolerated C1 TCHP relatively well, but experienced diarrhea and fatigue. -I prescribed lomotil for her on 04/10/21 for her to use. I refilled for her today. -she continues to have about 5 BM a day (up to 8-10 per day at the worst), we again reviewed management  with pt  -she received her Covid booster on 05/13/21. So we will plan on giving her flu shot in ~2 weeks.   3. Anxiety and depression, HTN -f/u PCP -continue HCTZ, will monitor her blood pressure closely during chemotherapy.  Advised her to hold if she is not drinking well or feels dizzy -Social work follow-up   4. Mild Anemia -her hgb has dropped recently secondary to chemo  -she still has periods, most recent cycle began 04/06/2021, bleeding is light -She began oral iron, will check her iron level next time  -hgb today is 9.5. We will monitor. I discussed blood transfusion may be needed if this worsens.     PLAN: -proceed with C3 TCHP today -lab, flush, f/u, and TCHP in 3 and 6 weeks  -G-CSF on day 3  -2hr IVF on day 6 -flu shot in 2 weeks   No problem-specific Assessment & Plan notes found for this encounter.   SUMMARY OF ONCOLOGIC HISTORY: Oncology History Overview Note  Cancer Staging Malignant neoplasm of upper-outer quadrant of left breast in female, estrogen receptor positive ([place]) Staging form: Breast, AJCC 8th Edition - Clinical stage from 03/07/2021: Stage IB (cT2, cN0, cM0, G3, ER+, PR+, HER2+) - Signed by Truitt Merle, MD on 03/17/2021 Stage prefix: Initial diagnosis Histologic grading system: 3 grade system    Malignant neoplasm of upper-outer quadrant of left breast in female, estrogen receptor positive ([place])  03/05/2021 Mammogram   Diagnostic Left Mammogram; Left Breast Ultrasound  IMPRESSION: At the palpable site of concern in the left breast at 12 o'clock there is a suspicious mass measuring 2.1 cm.  03/07/2021 Cancer Staging   Staging form: Breast, AJCC 8th Edition - Clinical stage from 03/07/2021: Stage IB (cT2, cN0, cM0, G3, ER+, PR+, HER2+) - Signed by Truitt Merle, MD on 03/17/2021 Stage prefix: Initial diagnosis Histologic grading system: 3 grade system    03/07/2021 Pathology Results   Diagnosis Breast, left, needle core biopsy, left breast 12 o'  clock - INVASIVE DUCTAL CARCINOMA - SEE COMMENT Based on the biopsy, the carcinoma appears [place] grade 3 of 3  PROGNOSTIC INDICATORS Results: IMMUNOHISTOCHEMICAL AND MORPHOMETRIC ANALYSIS PERFORMED MANUALLY The tumor cells are POSITIVE for Her2 (3+). Estrogen Receptor: 30%, POSITIVE, WEAK STAINING INTENSITY Progesterone Receptor: 15%, POSITIVE, MODERATE STAINING INTENSITY Proliferation Marker Ki67: 40%     03/13/2021 Initial Diagnosis   Malignant neoplasm of upper-outer quadrant of left breast in female, estrogen receptor positive ([place])   03/26/2021 Imaging   MRI Breast  IMPRESSION: 1. 2.5 cm biopsy-proven malignancy within the UPPER LEFT breast. 2. Abnormal lymph node with focal cortical thickening in the posterior UPPER-OUTER LEFT breast/LOWER LEFT axilla. 2nd-look ultrasound with possible biopsy is recommended. 3. Indeterminate 0.8 cm OUTER RIGHT breast mass. 2nd-look ultrasound with possible biopsy is recommended.   03/29/2021 Genetic Testing   Negative hereditary cancer genetic testing: no pathogenic variants detected in [facility] CustomNext-Cancer +RNAinsight Panel.  The report date is March 29, 2021.   The CustomNext-Cancer+RNAinsight panel offered by [facility] includes sequencing and rearrangement analysis for the following 47 genes:  APC, ATM, AXIN2, BARD1, BMPR1A, BRCA1, BRCA2, BRIP1, CDH1, CDK4, CDKN2A, CHEK2, DICER1, EPCAM, GREM1, HOXB13, MEN1, MLH1, MSH2, MSH3, MSH6, MUTYH, NBN, NF1, NF2, NTHL1, PALB2, PMS2, POLD1, POLE, PTEN, RAD51C, RAD51D, RECQL, RET, SDHA, SDHAF2, SDHB, SDHC, SDHD, SMAD4, SMARCA4, STK11, TP53, TSC1, TSC2, and VHL.  RNA data is routinely analyzed for use in variant interpretation for all genes.   04/04/2021 -  Chemotherapy   Patient is on Treatment Plan : BREAST  Docetaxel + Carboplatin + Trastuzumab + Pertuzumab  (TCHP) q21d      04/14/2021 Imaging   [place] Bilateral Breast  IMPRESSION: 1. No sonographic correlate for the 8 mm enhancing mass in  the outer right breast. Recommendation is to prior seed with MRI guided biopsy. 2. An undulating, low lying lymph node along the 1 o'clock axis of the left breast demonstrates no areas of cortical thickening beyond 3 mm.      INTERVAL HISTORY:  CORIE ALLIS is here for a follow up of breast cancer. She was last seen by me on 04/24/21. She presents to the clinic alone. She reports she continues to have diarrhea and has a bowel movement 5 times a day. At its worst, she reports she had 8-10 BM per day. She notes she is not currently working.   All other systems were reviewed with the patient and are negative.  MEDICAL HISTORY:  Past Medical History:  Diagnosis Date   Anxiety    Breast cancer ([place])    Depression    Family history of colon cancer 03/19/2021   History of kidney stones    Hypertension     SURGICAL HISTORY: Past Surgical History:  Procedure Laterality Date   CHOLECYSTECTOMY  2002   INSERTION OF MESH N/A 10/28/2016   Procedure: INSERTION OF MESH;  Surgeon: Clovis [name], MD;  Location: [facility];  Service: General;  Laterality: N/A;   PORTACATH PLACEMENT Right 04/02/2021   Procedure: INSERTION PORT-A-CATH;  Surgeon: Rolm Bookbinder, MD;  Location: [facility];  Service: General;  Laterality: Right;  TUBAL LIGATION  1999   VENTRAL HERNIA REPAIR N/A 10/28/2016   Procedure: LAPAROSCOPIC VENTRAL HERNIA REPAIR;  Surgeon: Clovis [name], MD;  Location: [facility];  Service: General;  Laterality: N/A;   WRIST SURGERY      I have reviewed the social history and family history with the patient and they are unchanged from previous note.  ALLERGIES:  is allergic to no known allergies.  MEDICATIONS:  Current Outpatient Medications  Medication Sig Dispense Refill   dexamethasone (DECADRON) 4 MG tablet Take 1 tablet (4 mg total) by mouth 2 (two) times daily. Start the day before Taxotere. Then take daily x 3 days after chemotherapy. 30 tablet 1   diphenoxylate-atropine  (LOMOTIL) 2.5-0.025 MG tablet Take 2 tablets by mouth 4 (four) times daily as needed for diarrhea or loose stools. 90 tablet 1   hydrochlorothiazide (HYDRODIURIL) 25 MG tablet Take 25 mg by mouth daily.     lidocaine-prilocaine (EMLA) cream Apply to affected area once 30 g 3   ondansetron (ZOFRAN) 8 MG tablet Take 1 tablet (8 mg total) by mouth 2 (two) times daily as needed (Nausea or vomiting). Start on the third day after chemotherapy. 30 tablet 1   prochlorperazine (COMPAZINE) 10 MG tablet Take 1 tablet (10 mg total) by mouth every 6 (six) hours as needed (Nausea or vomiting). 30 tablet 1   No current facility-administered medications for this visit.   Facility-Administered Medications Ordered in Other Visits  Medication Dose Route Frequency Provider Last Rate Last Admin   CARBOplatin (PARAPLATIN) 900 mg in sodium chloride 0.9 % 500 mL chemo infusion  900 mg Intravenous Once Truitt Merle, MD       DOCEtaxel (TAXOTERE) 160 mg in sodium chloride 0.9 % 250 mL chemo infusion  75 mg/m2 (Treatment Plan Recorded) Intravenous Once Truitt Merle, MD       fosaprepitant (EMEND) 150 mg in sodium chloride 0.9 % 145 mL IVPB  150 mg Intravenous Once Truitt Merle, MD 450 mL/hr at 05/19/21 1048 150 mg at 05/19/21 1048   heparin lock flush 100 unit/mL  500 Units Intracatheter Once PRN Truitt Merle, MD       pertuzumab (PERJETA) 420 mg in sodium chloride 0.9 % 250 mL chemo infusion  420 mg Intravenous Once Truitt Merle, MD       sodium chloride flush (NS) 0.9 % injection 10 mL  10 mL Intracatheter PRN Truitt Merle, MD       trastuzumab-dkst (OGIVRI) 600 mg in sodium chloride 0.9 % 250 mL chemo infusion  600 mg Intravenous Once Truitt Merle, MD        PHYSICAL EXAMINATION: ECOG PERFORMANCE STATUS: 1 - Symptomatic but completely ambulatory  Vitals:   05/19/21 0934  BP: (!) 128/92  Pulse: 87  Resp: 17  Temp: 98.7 F (37.1 C)  SpO2: 100%   Wt Readings from Last 3 Encounters:  05/19/21 214 lb 12.8 oz (97.4 kg)  04/24/21 222  lb 1.6 oz (100.7 kg)  04/09/21 217 lb 6.4 oz (98.6 kg)     GENERAL:alert, no distress and comfortable SKIN: skin color, texture, turgor are normal, no rashes or significant lesions EYES: normal, Conjunctiva are pink and non-injected, sclera clear  NECK: supple, thyroid normal size, non-tender, without nodularity LYMPH:  no palpable lymphadenopathy in the cervical, axillary  Musculoskeletal:no cyanosis of digits and no clubbing  NEURO: alert & oriented x 3 with fluent speech, no focal motor/sensory deficits BREAST: improvement to left breast cancer-- some fullness to 12 o'clock position  with no discrete palpable mass.  LABORATORY DATA:  I have reviewed the data as listed CBC Latest Ref Rng & Units 05/19/2021 04/24/2021 04/09/2021  WBC 4.0 - 10.5 K/uL 4.2 4.1 10.9(H)  Hemoglobin 12.0 - 15.0 g/dL 9.6(L) 9.5(L) 11.5(L)  Hematocrit 36.0 - 46.0 % 30.2(L) 30.6(L) 35.4(L)  Platelets 150 - 400 K/uL 222 259 416(H)     CMP Latest Ref Rng & Units 05/19/2021 04/24/2021 04/09/2021  Glucose 70 - 99 mg/dL 114(H) 110(H) 130(H)  BUN 6 - 20 mg/dL _0 Creatinine 0.44 - 1.00 mg/dL 0.68 0.70 0.82  Sodium 135 - 145 mmol/L 139 144 135  Potassium 3.5 - 5.1 mmol/L 3.4(L) 3.4(L) 3.6  Chloride 98 - 111 mmol/L 103 111 99  CO2 22 - 32 mmol/L _1 Calcium 8.9 - 10.3 mg/dL 9.2 9.3 9.2  Total Protein 6.5 - 8.1 g/dL 6.9 6.5 7.2  Total Bilirubin 0.3 - 1.2 mg/dL 0.6 0.5 1.2  Alkaline Phos 38 - 126 U/L 50 52 61  AST 15 - 41 U/L _2 ALT 0 - 44 U/L _3 RADIOGRAPHIC STUDIES: I have personally reviewed the radiological images as listed and agreed with the findings in the report. No results found.    No orders of the defined types were placed in this encounter.  All questions were answered. The patient knows to call the clinic with any problems, questions or concerns. No barriers to learning was detected. The total time spent in the appointment was 30 minutes.     Truitt Merle,  MD 05/19/2021   I, Wilburn Mylar, am acting as scribe for Truitt Merle, MD.   I have reviewed the above documentation for accuracy and completeness, and I agree with the above.

## 2021-05-19 NOTE — Patient Instructions (Addendum)
  [facility] ONCOL[facility]  Discharge Instructions: Thank you for choosing [facility] to provide your oncology and hematology care.   If you have a lab appointment with the [facility], please go directly to the [facility] and check in at the registration area.   Wear comfortable clothing and clothing appropriate for easy access to any Portacath or PICC line.   We strive to give you quality time with your provider. You may need to reschedule your appointment if you arrive late (15 or more minutes).  Arriving late affects you and other patients whose appointments are after yours.  Also, if you miss three or more appointments without notifying the office, you may be dismissed from the clinic at the provider's discretion.      For prescription refill requests, have your pharmacy contact our office and allow 72 hours for refills to be completed.    Today you received the following chemotherapy and/or immunotherapy agents: Herceptin, perjeta, docetaxel, carboplatin.      To help prevent nausea and vomiting after your treatment, we encourage you to take your nausea medication as directed.  BELOW ARE SYMPTOMS THAT SHOULD BE REPORTED IMMEDIATELY: *FEVER GREATER THAN 100.4 F (38 C) OR HIGHER *CHILLS OR SWEATING *NAUSEA AND VOMITING THAT IS NOT CONTROLLED WITH YOUR NAUSEA MEDICATION *UNUSUAL SHORTNESS OF BREATH *UNUSUAL BRUISING OR BLEEDING *URINARY PROBLEMS (pain or burning when urinating, or frequent urination) *BOWEL PROBLEMS (unusual diarrhea, constipation, pain near the anus) TENDERNESS IN MOUTH AND THROAT WITH OR WITHOUT PRESENCE OF ULCERS (sore throat, sores in mouth, or a toothache) UNUSUAL RASH, SWELLING OR PAIN  UNUSUAL VAGINAL DISCHARGE OR ITCHING   Items with * indicate a potential emergency and should be followed up as soon as possible or go to the Emergency Department if any problems should occur.  Please show the CHEMOTHERAPY ALERT CARD or  IMMUNOTHERAPY ALERT CARD at check-in to the Emergency Department and triage nurse.  Should you have questions after your visit or need to cancel or reschedule your appointment, please contact [facility]  Dept: [phone]  and follow the prompts.  Office hours are 8:00 a.m. to 4:30 p.m. Monday - Friday. Please note that voicemails left after 4:00 p.m. may not be returned until the following business day.  We are closed weekends and major holidays. You have access to a nurse at all times for urgent questions. Please call the main number to the clinic Dept: [phone] and follow the prompts.   For any non-urgent questions, you may also contact your provider using MyChart. We now offer e-Visits for anyone 38 and older to request care online for non-urgent symptoms. For details visit [email or web address].   Also download the MyChart app! Go to the app store, search "MyChart", open the app, select [facility], and log in with your MyChart username and password.  Due to Covid, a mask is required upon entering the hospital/clinic. If you do not have a mask, one will be given to you upon arrival. For doctor visits, patients may have 1 support person aged 48 or older with them. For treatment visits, patients cannot have anyone with them due to current Covid guidelines and our immunocompromised population.

## 2021-05-21 ENCOUNTER — Other Ambulatory Visit: Payer: Self-pay

## 2021-05-21 ENCOUNTER — Inpatient Hospital Stay: Payer: Federal, State, Local not specified - PPO

## 2021-05-21 VITALS — BP 132/93 | HR 87 | Temp 99.1°F | Resp 20

## 2021-05-21 DIAGNOSIS — Z5112 Encounter for antineoplastic immunotherapy: Secondary | ICD-10-CM | POA: Diagnosis not present

## 2021-05-21 DIAGNOSIS — Z5189 Encounter for other specified aftercare: Secondary | ICD-10-CM | POA: Diagnosis not present

## 2021-05-21 DIAGNOSIS — Z170 Estrogen receptor positive status [ER+]: Secondary | ICD-10-CM | POA: Diagnosis not present

## 2021-05-21 DIAGNOSIS — C50412 Malignant neoplasm of upper-outer quadrant of left female breast: Secondary | ICD-10-CM | POA: Diagnosis not present

## 2021-05-21 DIAGNOSIS — Z5111 Encounter for antineoplastic chemotherapy: Secondary | ICD-10-CM | POA: Diagnosis not present

## 2021-05-21 MED ORDER — PEGFILGRASTIM-CBQV 6 MG/0.6ML ~~LOC~~ SOSY
6.0000 mg | PREFILLED_SYRINGE | Freq: Once | SUBCUTANEOUS | Status: AC
  Administered 2021-05-21: 6 mg via SUBCUTANEOUS
  Filled 2021-05-21: qty 0.6

## 2021-05-24 ENCOUNTER — Inpatient Hospital Stay: Payer: Federal, State, Local not specified - PPO

## 2021-05-24 ENCOUNTER — Other Ambulatory Visit: Payer: Self-pay

## 2021-05-24 VITALS — BP 110/82 | HR 109 | Temp 97.5°F | Resp 18

## 2021-05-24 DIAGNOSIS — Z95828 Presence of other vascular implants and grafts: Secondary | ICD-10-CM

## 2021-05-24 DIAGNOSIS — Z5111 Encounter for antineoplastic chemotherapy: Secondary | ICD-10-CM | POA: Diagnosis not present

## 2021-05-24 DIAGNOSIS — Z5112 Encounter for antineoplastic immunotherapy: Secondary | ICD-10-CM | POA: Diagnosis not present

## 2021-05-24 DIAGNOSIS — Z170 Estrogen receptor positive status [ER+]: Secondary | ICD-10-CM | POA: Diagnosis not present

## 2021-05-24 DIAGNOSIS — C50412 Malignant neoplasm of upper-outer quadrant of left female breast: Secondary | ICD-10-CM | POA: Diagnosis not present

## 2021-05-24 DIAGNOSIS — Z5189 Encounter for other specified aftercare: Secondary | ICD-10-CM | POA: Diagnosis not present

## 2021-05-24 MED ORDER — SODIUM CHLORIDE 0.9 % IV SOLN: Freq: Once | INTRAVENOUS | Status: AC

## 2021-05-24 MED ORDER — SODIUM CHLORIDE 0.9% FLUSH
10.0000 mL | Freq: Once | INTRAVENOUS | Status: AC
  Administered 2021-05-24: 10 mL

## 2021-05-24 MED ORDER — HEPARIN SOD (PORK) LOCK FLUSH 100 UNIT/ML IV SOLN: 250.0000 [IU] | Freq: Once | INTRAVENOUS | Status: DC | PRN

## 2021-05-24 MED ORDER — HEPARIN SOD (PORK) LOCK FLUSH 100 UNIT/ML IV SOLN: 500.0000 [IU] | Freq: Once | INTRAVENOUS | Status: DC | PRN

## 2021-05-24 MED ORDER — SODIUM CHLORIDE 0.9% FLUSH: 10.0000 mL | Freq: Once | INTRAVENOUS | Status: DC | PRN

## 2021-05-24 MED ORDER — HEPARIN SOD (PORK) LOCK FLUSH 100 UNIT/ML IV SOLN
500.0000 [IU] | Freq: Once | INTRAVENOUS | Status: AC
  Administered 2021-05-24: 500 [IU]

## 2021-05-24 MED ORDER — SODIUM CHLORIDE 0.9% FLUSH: 3.0000 mL | Freq: Once | INTRAVENOUS | Status: DC | PRN

## 2021-05-24 MED ORDER — ALTEPLASE 2 MG IJ SOLR
2.0000 mg | Freq: Once | INTRAMUSCULAR | Status: DC | PRN
Start: 2021-05-24 — End: 2021-05-24

## 2021-05-24 NOTE — Patient Instructions (Signed)
[facility]  Discharge Instructions: Thank you for choosing [facility] to provide your oncology and hematology care.   If you have a lab appointment with the Cancer Center, please go directly to the Cancer Center and check in at the registration area.   Wear comfortable clothing and clothing appropriate for easy access to any Portacath or PICC line.   We strive to give you quality time with your provider. You may need to reschedule your appointment if you arrive late (15 or more minutes).  Arriving late affects you and other patients whose appointments are after yours.  Also, if you miss three or more appointments without notifying the office, you may be dismissed from the clinic at the provider's discretion.      For prescription refill requests, have your pharmacy contact our office and allow 72 hours for refills to be completed.    Today you received the following chemotherapy and/or immunotherapy agents   To help prevent nausea and vomiting after your treatment, we encourage you to take your nausea medication as directed.  BELOW ARE SYMPTOMS THAT SHOULD BE REPORTED IMMEDIATELY: *FEVER GREATER THAN 100.4 F (38 C) OR HIGHER *CHILLS OR SWEATING *NAUSEA AND VOMITING THAT IS NOT CONTROLLED WITH YOUR NAUSEA MEDICATION *UNUSUAL SHORTNESS OF BREATH *UNUSUAL BRUISING OR BLEEDING *URINARY PROBLEMS (pain or burning when urinating, or frequent urination) *BOWEL PROBLEMS (unusual diarrhea, constipation, pain near the anus) TENDERNESS IN MOUTH AND THROAT WITH OR WITHOUT PRESENCE OF ULCERS (sore throat, sores in mouth, or a toothache) UNUSUAL RASH, SWELLING OR PAIN  UNUSUAL VAGINAL DISCHARGE OR ITCHING   Items with * indicate a potential emergency and should be followed up as soon as possible or go to the Emergency Department if any problems should occur.  Please show the CHEMOTHERAPY ALERT CARD or IMMUNOTHERAPY ALERT CARD at check-in to the Emergency  Department and triage nurse.  Should you have questions after your visit or need to cancel or reschedule your appointment, please contact [facility]  Dept: [phone]  and follow the prompts.  Office hours are 8:00 a.m. to 4:30 p.m. Monday - Friday. Please note that voicemails left after 4:00 p.m. may not be returned until the following business day.  We are closed weekends and major holidays. You have access to a nurse at all times for urgent questions. Please call the main number to the clinic Dept: [phone] and follow the prompts.   For any non-urgent questions, you may also contact your provider using MyChart. We now offer e-Visits for anyone 18 and older to request care online for non-urgent symptoms. For details visit [email or web address].   Also download the MyChart app! Go to the app store, search "MyChart", open the app, select [facility], and log in with your MyChart username and password.  Due to Covid, a mask is required upon entering the hospital/clinic. If you do not have a mask, one will be given to you upon arrival. For doctor visits, patients may have 1 support person aged 18 or older with them. For treatment visits, patients cannot have anyone with them due to current Covid guidelines and our immunocompromised population.   

## 2021-05-26 ENCOUNTER — Other Ambulatory Visit: Payer: Self-pay

## 2021-05-26 ENCOUNTER — Inpatient Hospital Stay: Payer: Federal, State, Local not specified - PPO

## 2021-05-26 ENCOUNTER — Telehealth: Payer: Self-pay

## 2021-05-26 ENCOUNTER — Other Ambulatory Visit: Payer: Self-pay | Admitting: *Deleted

## 2021-05-26 VITALS — BP 112/87 | HR 101 | Temp 98.0°F | Resp 18 | Ht 65.0 in | Wt 201.5 lb

## 2021-05-26 DIAGNOSIS — C50412 Malignant neoplasm of upper-outer quadrant of left female breast: Secondary | ICD-10-CM

## 2021-05-26 DIAGNOSIS — Z5189 Encounter for other specified aftercare: Secondary | ICD-10-CM | POA: Diagnosis not present

## 2021-05-26 DIAGNOSIS — E860 Dehydration: Secondary | ICD-10-CM

## 2021-05-26 DIAGNOSIS — Z170 Estrogen receptor positive status [ER+]: Secondary | ICD-10-CM

## 2021-05-26 DIAGNOSIS — Z5112 Encounter for antineoplastic immunotherapy: Secondary | ICD-10-CM | POA: Diagnosis not present

## 2021-05-26 DIAGNOSIS — Z5111 Encounter for antineoplastic chemotherapy: Secondary | ICD-10-CM | POA: Diagnosis not present

## 2021-05-26 DIAGNOSIS — Z95828 Presence of other vascular implants and grafts: Secondary | ICD-10-CM

## 2021-05-26 LAB — CBC WITH DIFFERENTIAL (CANCER CENTER ONLY)
Abs Immature Granulocytes: 0.02 10*3/uL (ref 0.00–0.07)
Basophils Absolute: 0 10*3/uL (ref 0.0–0.1)
Basophils Relative: 1 %
Eosinophils Absolute: 0 10*3/uL (ref 0.0–0.5)
Eosinophils Relative: 0 %
HCT: 34.3 % — ABNORMAL LOW (ref 36.0–46.0)
Hemoglobin: 11.5 g/dL — ABNORMAL LOW (ref 12.0–15.0)
Immature Granulocytes: 1 %
Lymphocytes Relative: 62 %
Lymphs Abs: 1.5 10*3/uL (ref 0.7–4.0)
MCH: 27.9 pg (ref 26.0–34.0)
MCHC: 33.5 g/dL (ref 30.0–36.0)
MCV: 83.3 fL (ref 80.0–100.0)
Monocytes Absolute: 0.5 10*3/uL (ref 0.1–1.0)
Monocytes Relative: 20 %
Neutro Abs: 0.4 10*3/uL — CL (ref 1.7–7.7)
Neutrophils Relative %: 16 %
Platelet Count: 257 10*3/uL (ref 150–400)
RBC: 4.12 MIL/uL (ref 3.87–5.11)
RDW: 18.4 % — ABNORMAL HIGH (ref 11.5–15.5)
WBC Count: 2.5 10*3/uL — ABNORMAL LOW (ref 4.0–10.5)
nRBC: 0 % (ref 0.0–0.2)

## 2021-05-26 LAB — CMP (CANCER CENTER ONLY)
ALT: 48 U/L — ABNORMAL HIGH (ref 0–44)
AST: 22 U/L (ref 15–41)
Albumin: 4.3 g/dL (ref 3.5–5.0)
Alkaline Phosphatase: 68 U/L (ref 38–126)
Anion gap: 13 (ref 5–15)
BUN: 19 mg/dL (ref 6–20)
CO2: 28 mmol/L (ref 22–32)
Calcium: 10 mg/dL (ref 8.9–10.3)
Chloride: 95 mmol/L — ABNORMAL LOW (ref 98–111)
Creatinine: 1.19 mg/dL — ABNORMAL HIGH (ref 0.44–1.00)
GFR, Estimated: 56 mL/min — ABNORMAL LOW (ref >60)
Glucose, Bld: 124 mg/dL — ABNORMAL HIGH (ref 70–99)
Potassium: 3.8 mmol/L (ref 3.5–5.1)
Sodium: 136 mmol/L (ref 135–145)
Total Bilirubin: 0.9 mg/dL (ref 0.3–1.2)
Total Protein: 7.9 g/dL (ref 6.5–8.1)

## 2021-05-26 MED ORDER — SODIUM CHLORIDE 0.9% FLUSH
10.0000 mL | Freq: Once | INTRAVENOUS | Status: AC
  Administered 2021-05-26: 10 mL

## 2021-05-26 MED ORDER — ONDANSETRON HCL 4 MG/2ML IJ SOLN
8.0000 mg | Freq: Once | INTRAMUSCULAR | Status: AC
  Administered 2021-05-26: 8 mg via INTRAVENOUS

## 2021-05-26 MED ORDER — FAMOTIDINE 20 MG IN NS 100 ML IVPB: 20.0000 mg | Freq: Once | INTRAVENOUS | Status: DC

## 2021-05-26 MED ORDER — ONDANSETRON HCL 4 MG/2ML IJ SOLN
INTRAMUSCULAR | Status: AC
  Filled 2021-05-26: qty 2

## 2021-05-26 MED ORDER — FAMOTIDINE 20 MG IN NS 100 ML IVPB
20.0000 mg | Freq: Once | INTRAVENOUS | Status: AC
  Administered 2021-05-26: 20 mg via INTRAVENOUS
  Filled 2021-05-26: qty 100

## 2021-05-26 MED ORDER — SODIUM CHLORIDE 0.9 % IV SOLN: INTRAVENOUS | Status: AC

## 2021-05-26 MED ORDER — HEPARIN SOD (PORK) LOCK FLUSH 100 UNIT/ML IV SOLN
500.0000 [IU] | Freq: Once | INTRAVENOUS | Status: AC
  Administered 2021-05-26: 500 [IU]

## 2021-05-26 MED ORDER — SODIUM CHLORIDE 0.9 % IV SOLN: 8.0000 mg | Freq: Once | INTRAVENOUS | Status: DC

## 2021-05-26 NOTE — Telephone Encounter (Signed)
Pt coming in to [facility] today for dehydration r/t N/V since Friday 05/23/2021.  Pt came in on Saturday 05/24/2021 hydration but N/V has not resolved.  Pt stated she's having N/Vx2 per day since Saturday and x1 today.  Verbal orders from Dr. Burr Medico for 1L NS over 2hrs, Famotidine 20mg  IVPB, and Zofran 8mg  IV Push.  Re-educated pt on how to take home antinausea medications.  Pt verbalized understanding of teaching.  Pt will come to [facility] at 1pm today.  No Labs order by Dr. Burr Medico since the pt denied diarrhea and have only had 2 episodes of vomiting per day.  Pt confirmed that she's drinking Pedialyte but has not been eating much d/t her nausea.  Pt confirmed appt today at 1pm in [facility].

## 2021-05-26 NOTE — Progress Notes (Signed)
Pt tolerated IV fluids well. Pt ambulated out of facility. Advised to call with any concerns. Pt verbalized understanding.

## 2021-05-26 NOTE — Progress Notes (Signed)
Pt coming in to [facility] today for dehydration r/t N/V since Friday 05/23/2021.  Pt came in on Saturday 05/24/2021 hydration but N/V has not resolved.  Pt stated she's having N/Vx2 per day since Saturday and x1 today.  Verbal orders from Dr. Burr Medico for 1L NS over 2hrs, Famotidine 20mg  IVPB, and Zofran 8mg  IV Push.  Re-educated pt on how to take home antinausea medications.  Pt verbalized understanding of teaching.  Pt will come to [facility] at 1pm today.  No Labs order by Dr. Burr Medico since the pt denied diarrhea and have only had 2 episodes of vomiting per day.  Pt confirmed that she's drinking Pedialyte but has not been eating much d/t her nausea.

## 2021-05-27 ENCOUNTER — Encounter: Payer: Self-pay | Admitting: *Deleted

## 2021-06-02 ENCOUNTER — Inpatient Hospital Stay: Payer: Federal, State, Local not specified - PPO | Attending: Hematology

## 2021-06-02 ENCOUNTER — Other Ambulatory Visit: Payer: Self-pay

## 2021-06-02 DIAGNOSIS — Z5111 Encounter for antineoplastic chemotherapy: Secondary | ICD-10-CM | POA: Insufficient documentation

## 2021-06-02 DIAGNOSIS — Z5189 Encounter for other specified aftercare: Secondary | ICD-10-CM | POA: Diagnosis not present

## 2021-06-02 DIAGNOSIS — C50412 Malignant neoplasm of upper-outer quadrant of left female breast: Secondary | ICD-10-CM | POA: Insufficient documentation

## 2021-06-02 DIAGNOSIS — Z5112 Encounter for antineoplastic immunotherapy: Secondary | ICD-10-CM | POA: Insufficient documentation

## 2021-06-02 DIAGNOSIS — Z170 Estrogen receptor positive status [ER+]: Secondary | ICD-10-CM | POA: Insufficient documentation

## 2021-06-02 DIAGNOSIS — Z23 Encounter for immunization: Secondary | ICD-10-CM | POA: Diagnosis not present

## 2021-06-02 DIAGNOSIS — R110 Nausea: Secondary | ICD-10-CM | POA: Diagnosis not present

## 2021-06-02 MED ORDER — INFLUENZA VAC SPLIT QUAD 0.5 ML IM SUSY
0.5000 mL | PREFILLED_SYRINGE | Freq: Once | INTRAMUSCULAR | Status: AC
  Administered 2021-06-02: 0.5 mL via INTRAMUSCULAR
  Filled 2021-06-02: qty 0.5

## 2021-06-03 ENCOUNTER — Ambulatory Visit: Payer: Federal, State, Local not specified - PPO

## 2021-06-09 MED FILL — Fosaprepitant Dimeglumine For IV Infusion 150 MG (Base Eq): INTRAVENOUS | Qty: 5 | Status: AC

## 2021-06-09 MED FILL — Dexamethasone Sodium Phosphate Inj 100 MG/10ML: INTRAMUSCULAR | Qty: 1 | Status: AC

## 2021-06-10 ENCOUNTER — Encounter: Payer: Self-pay | Admitting: Hematology

## 2021-06-10 ENCOUNTER — Other Ambulatory Visit: Payer: Self-pay

## 2021-06-10 ENCOUNTER — Inpatient Hospital Stay (HOSPITAL_BASED_OUTPATIENT_CLINIC_OR_DEPARTMENT_OTHER): Payer: Federal, State, Local not specified - PPO | Admitting: Hematology

## 2021-06-10 ENCOUNTER — Inpatient Hospital Stay: Payer: Federal, State, Local not specified - PPO

## 2021-06-10 VITALS — BP 116/84 | HR 98 | Temp 98.3°F | Resp 17 | Ht 65.0 in | Wt 211.4 lb

## 2021-06-10 DIAGNOSIS — Z95828 Presence of other vascular implants and grafts: Secondary | ICD-10-CM

## 2021-06-10 DIAGNOSIS — C50412 Malignant neoplasm of upper-outer quadrant of left female breast: Secondary | ICD-10-CM

## 2021-06-10 DIAGNOSIS — Z170 Estrogen receptor positive status [ER+]: Secondary | ICD-10-CM | POA: Diagnosis not present

## 2021-06-10 DIAGNOSIS — R110 Nausea: Secondary | ICD-10-CM | POA: Diagnosis not present

## 2021-06-10 DIAGNOSIS — Z5111 Encounter for antineoplastic chemotherapy: Secondary | ICD-10-CM | POA: Diagnosis not present

## 2021-06-10 DIAGNOSIS — Z5112 Encounter for antineoplastic immunotherapy: Secondary | ICD-10-CM | POA: Diagnosis not present

## 2021-06-10 DIAGNOSIS — Z23 Encounter for immunization: Secondary | ICD-10-CM | POA: Diagnosis not present

## 2021-06-10 DIAGNOSIS — Z5189 Encounter for other specified aftercare: Secondary | ICD-10-CM | POA: Diagnosis not present

## 2021-06-10 LAB — CBC WITH DIFFERENTIAL (CANCER CENTER ONLY)
Abs Immature Granulocytes: 0.02 10*3/uL (ref 0.00–0.07)
Basophils Absolute: 0 10*3/uL (ref 0.0–0.1)
Basophils Relative: 0 %
Eosinophils Absolute: 0 10*3/uL (ref 0.0–0.5)
Eosinophils Relative: 1 %
HCT: 26.8 % — ABNORMAL LOW (ref 36.0–46.0)
Hemoglobin: 8.6 g/dL — ABNORMAL LOW (ref 12.0–15.0)
Immature Granulocytes: 1 %
Lymphocytes Relative: 46 %
Lymphs Abs: 2.1 10*3/uL (ref 0.7–4.0)
MCH: 29.1 pg (ref 26.0–34.0)
MCHC: 32.1 g/dL (ref 30.0–36.0)
MCV: 90.5 fL (ref 80.0–100.0)
Monocytes Absolute: 0.2 10*3/uL (ref 0.1–1.0)
Monocytes Relative: 5 %
Neutro Abs: 2.1 10*3/uL (ref 1.7–7.7)
Neutrophils Relative %: 47 %
Platelet Count: 256 10*3/uL (ref 150–400)
RBC: 2.96 MIL/uL — ABNORMAL LOW (ref 3.87–5.11)
RDW: 19.7 % — ABNORMAL HIGH (ref 11.5–15.5)
WBC Count: 4.4 10*3/uL (ref 4.0–10.5)
nRBC: 0 % (ref 0.0–0.2)

## 2021-06-10 LAB — CMP (CANCER CENTER ONLY)
ALT: 21 U/L (ref 0–44)
AST: 19 U/L (ref 15–41)
Albumin: 3.5 g/dL (ref 3.5–5.0)
Alkaline Phosphatase: 61 U/L (ref 38–126)
Anion gap: 14 (ref 5–15)
BUN: 18 mg/dL (ref 6–20)
CO2: 26 mmol/L (ref 22–32)
Calcium: 9.2 mg/dL (ref 8.9–10.3)
Chloride: 100 mmol/L (ref 98–111)
Creatinine: 0.79 mg/dL (ref 0.44–1.00)
GFR, Estimated: >60 mL/min (ref >60)
Glucose, Bld: 170 mg/dL — ABNORMAL HIGH (ref 70–99)
Potassium: 3.1 mmol/L — ABNORMAL LOW (ref 3.5–5.1)
Sodium: 140 mmol/L (ref 135–145)
Total Bilirubin: 0.2 mg/dL — ABNORMAL LOW (ref 0.3–1.2)
Total Protein: 7.1 g/dL (ref 6.5–8.1)

## 2021-06-10 MED ORDER — SODIUM CHLORIDE 0.9 % IV SOLN
150.0000 mg | Freq: Once | INTRAVENOUS | Status: AC
  Administered 2021-06-10: 150 mg via INTRAVENOUS
  Filled 2021-06-10: qty 150

## 2021-06-10 MED ORDER — PALONOSETRON HCL INJECTION 0.25 MG/5ML
0.2500 mg | Freq: Once | INTRAVENOUS | Status: AC
  Administered 2021-06-10: 0.25 mg via INTRAVENOUS
  Filled 2021-06-10: qty 5

## 2021-06-10 MED ORDER — SODIUM CHLORIDE 0.9 % IV SOLN
65.0000 mg/m2 | Freq: Once | INTRAVENOUS | Status: AC
  Administered 2021-06-10: 140 mg via INTRAVENOUS
  Filled 2021-06-10: qty 14

## 2021-06-10 MED ORDER — SODIUM CHLORIDE 0.9% FLUSH
10.0000 mL | INTRAVENOUS | Status: DC | PRN
  Administered 2021-06-10: 10 mL

## 2021-06-10 MED ORDER — SODIUM CHLORIDE 0.9% FLUSH
10.0000 mL | Freq: Once | INTRAVENOUS | Status: AC
Start: 2021-06-10 — End: 2021-06-10
  Administered 2021-06-10: 10 mL

## 2021-06-10 MED ORDER — SODIUM CHLORIDE 0.9 % IV SOLN: Freq: Once | INTRAVENOUS | Status: AC

## 2021-06-10 MED ORDER — SODIUM CHLORIDE 0.9 % IV SOLN
420.0000 mg | Freq: Once | INTRAVENOUS | Status: AC
  Administered 2021-06-10: 420 mg via INTRAVENOUS
  Filled 2021-06-10: qty 14

## 2021-06-10 MED ORDER — SODIUM CHLORIDE 0.9 % IV SOLN
900.0000 mg | Freq: Once | INTRAVENOUS | Status: AC
  Administered 2021-06-10: 900 mg via INTRAVENOUS
  Filled 2021-06-10: qty 90

## 2021-06-10 MED ORDER — POTASSIUM CHLORIDE CRYS ER 20 MEQ PO TBCR: 20.0000 meq | EXTENDED_RELEASE_TABLET | Freq: Every day | ORAL | 1 refills | Status: DC

## 2021-06-10 MED ORDER — HEPARIN SOD (PORK) LOCK FLUSH 100 UNIT/ML IV SOLN
500.0000 [IU] | Freq: Once | INTRAVENOUS | Status: AC | PRN
  Administered 2021-06-10: 500 [IU]

## 2021-06-10 MED ORDER — TRASTUZUMAB-DKST CHEMO 150 MG IV SOLR
6.0000 mg/kg | Freq: Once | INTRAVENOUS | Status: AC
  Administered 2021-06-10: 609 mg via INTRAVENOUS
  Filled 2021-06-10: qty 29

## 2021-06-10 MED ORDER — SODIUM CHLORIDE 0.9 % IV SOLN
10.0000 mg | Freq: Once | INTRAVENOUS | Status: AC
  Administered 2021-06-10: 10 mg via INTRAVENOUS
  Filled 2021-06-10: qty 10

## 2021-06-10 MED ORDER — DIPHENHYDRAMINE HCL 25 MG PO CAPS
50.0000 mg | ORAL_CAPSULE | Freq: Once | ORAL | Status: AC
  Administered 2021-06-10: 50 mg via ORAL
  Filled 2021-06-10: qty 2

## 2021-06-10 MED ORDER — ACETAMINOPHEN 325 MG PO TABS
650.0000 mg | ORAL_TABLET | Freq: Once | ORAL | Status: AC
  Administered 2021-06-10: 650 mg via ORAL
  Filled 2021-06-10: qty 2

## 2021-06-10 NOTE — Patient Instructions (Signed)
[facility] ONCOL[facility]  Discharge Instructions: Thank you for choosing [facility] to provide your oncology and hematology care.   If you have a lab appointment with the [facility], please go directly to the [facility] and check in at the registration area.   Wear comfortable clothing and clothing appropriate for easy access to any Portacath or PICC line.   We strive to give you quality time with your provider. You may need to reschedule your appointment if you arrive late (15 or more minutes).  Arriving late affects you and other patients whose appointments are after yours.  Also, if you miss three or more appointments without notifying the office, you may be dismissed from the clinic at the provider's discretion.      For prescription refill requests, have your pharmacy contact our office and allow 72 hours for refills to be completed.    Today you received the following chemotherapy and/or immunotherapy agents: trastuzumab, pertuzumab, docetaxel, carboplatin      To help prevent nausea and vomiting after your treatment, we encourage you to take your nausea medication as directed.  BELOW ARE SYMPTOMS THAT SHOULD BE REPORTED IMMEDIATELY: *FEVER GREATER THAN 100.4 F (38 C) OR HIGHER *CHILLS OR SWEATING *NAUSEA AND VOMITING THAT IS NOT CONTROLLED WITH YOUR NAUSEA MEDICATION *UNUSUAL SHORTNESS OF BREATH *UNUSUAL BRUISING OR BLEEDING *URINARY PROBLEMS (pain or burning when urinating, or frequent urination) *BOWEL PROBLEMS (unusual diarrhea, constipation, pain near the anus) TENDERNESS IN MOUTH AND THROAT WITH OR WITHOUT PRESENCE OF ULCERS (sore throat, sores in mouth, or a toothache) UNUSUAL RASH, SWELLING OR PAIN  UNUSUAL VAGINAL DISCHARGE OR ITCHING   Items with * indicate a potential emergency and should be followed up as soon as possible or go to the Emergency Department if any problems should occur.  Please show the CHEMOTHERAPY ALERT CARD or  IMMUNOTHERAPY ALERT CARD at check-in to the Emergency Department and triage nurse.  Should you have questions after your visit or need to cancel or reschedule your appointment, please contact [facility]  Dept: [phone]  and follow the prompts.  Office hours are 8:00 a.m. to 4:30 p.m. Monday - Friday. Please note that voicemails left after 4:00 p.m. may not be returned until the following business day.  We are closed weekends and major holidays. You have access to a nurse at all times for urgent questions. Please call the main number to the clinic Dept: [phone] and follow the prompts.   For any non-urgent questions, you may also contact your provider using MyChart. We now offer e-Visits for anyone 84 and older to request care online for non-urgent symptoms. For details visit [email or web address].   Also download the MyChart app! Go to the app store, search "MyChart", open the app, select [facility], and log in with your MyChart username and password.  Due to Covid, a mask is required upon entering the hospital/clinic. If you do not have a mask, one will be given to you upon arrival. For doctor visits, patients may have 1 support person aged 22 or older with them. For treatment visits, patients cannot have anyone with them due to current Covid guidelines and our immunocompromised population.

## 2021-06-10 NOTE — Progress Notes (Signed)
[facility]   Telephone:([phone]) [phone] [phone]   Clinic Follow up Note   Patient Care Team: Lennie Odor, [place] as PCP - General (Physician Assistant) Rockwell Germany, RN as Oncology Nurse Navigator Mauro Kaufmann, RN as Oncology Nurse Navigator Rolm Bookbinder, MD as Consulting Physician (General Surgery) Truitt Merle, MD as Consulting Physician (Hematology) Kyung Rudd, MD as Consulting Physician (Radiation Oncology)  Date of Service:  06/10/2021  CHIEF COMPLAINT: f/u of left breast cancer  CURRENT THERAPY:  Neoadjuvant TCHP (docetaxel, carboplatin, herceptin, perjeta) with GCSF, starting 04/04/21  ASSESSMENT & PLAN:  Joanna Osborn is a 50 y.o. female with   1. 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 Left mammogram on 03/05/21 showed 2.1 cm mass at 12 o'clock. Biopsy confirmed invasive ductal carcinoma, grade 3, weakly ER+ and Her2+ -Given her Her2+ disease, she began neoadjuvant TCHP q3 weeks for 6 cycles, followed by Herceptin w or wo Perjeta maintenance therapy for a total of one year.   -Breast MRI 03/26/21 showed additional areas of concern-- a left breast/axilla lymph node, and a right breast mass. She is scheduled for second-look ultrasounds and possible biopsies on 04/14/21. If she has positive node disease, we will refer her for staging work-up -echo from 04/02/21 reviewed, no concerns  -She began neoadjuvant [facility] 04/03/21 with G-CSF. She is also receiving IVF and antiemetics on day 3 and 6 of each cycle.  -labs reviewed, overall adequate to proceed with C4 today. Will slightly reduce docetaxel dose due to neuropathy. We will continue G-CSF and IVF.   2. Symptom Management: diarrhea, fatigue, nausea -she tolerated C1 TCHP relatively well, but experienced diarrhea and fatigue. -I prescribed lomotil for her on 04/10/21 for her to use. -s/p C3 she  experienced uncontrollable nausea for about a week. She came in for IVF and antiemetics three different days. We will continue to give IVF   3. Anxiety and depression, HTN -f/u PCP -continue HCTZ, will monitor her blood pressure closely during chemotherapy.  Advised her to hold if she is not drinking well or feels dizzy -Social work follow-up   4. Mild Anemia -her hgb has dropped recently secondary to chemo  -she still has periods, bleeding is light -She began oral iron -hgb today is 8.6, likely secondary to nausea. We will monitor. I discussed blood transfusion may be needed if this worsens.   5.  Chemo induced peripheral neuropathy G1  -She noticed numbness on her toes cycle 3 chemo, exam today showed decreased vibration sensation mildly -I recommend her to start a B complex once daily -Was slightly decreased docetaxel chemo dose  PLAN: -proceed with C4 TCHP today with slight dose reduction of docetaxel due to neuropathy             -G-CSF on day 3             -2hr IVF on day 3 and 6 -lab, flush, f/u, and TCHP in 3 and 6 weeks with G-CSF and IVF   No problem-specific Assessment & Plan notes found for this encounter.   SUMMARY OF ONCOLOGIC HISTORY: Oncology History Overview Note  Cancer Staging Malignant neoplasm of upper-outer quadrant of left breast in female, estrogen receptor positive ([place]) Staging form: Breast, AJCC 8th Edition - Clinical stage from 03/07/2021: Stage IB (cT2, cN0, cM0, G3, ER+, PR+, HER2+) - Signed by Truitt Merle, MD on 03/17/2021 Stage prefix: Initial  diagnosis Histologic grading system: 3 grade system    Malignant neoplasm of upper-outer quadrant of left breast in female, estrogen receptor positive ([place])  03/05/2021 Mammogram   Diagnostic Left Mammogram; Left Breast Ultrasound  IMPRESSION: At the palpable site of concern in the left breast at 12 o'clock there is a suspicious mass measuring 2.1 cm.   03/07/2021 Cancer Staging   Staging form: Breast,  AJCC 8th Edition - Clinical stage from 03/07/2021: Stage IB (cT2, cN0, cM0, G3, ER+, PR+, HER2+) - Signed by Truitt Merle, MD on 03/17/2021 Stage prefix: Initial diagnosis Histologic grading system: 3 grade system    03/07/2021 Pathology Results   Diagnosis Breast, left, needle core biopsy, left breast 12 o' clock - INVASIVE DUCTAL CARCINOMA - SEE COMMENT Based on the biopsy, the carcinoma appears Nottingham grade 3 of 3  PROGNOSTIC INDICATORS Results: IMMUNOHISTOCHEMICAL AND MORPHOMETRIC ANALYSIS PERFORMED MANUALLY The tumor cells are POSITIVE for Her2 (3+). Estrogen Receptor: 30%, POSITIVE, WEAK STAINING INTENSITY Progesterone Receptor: 15%, POSITIVE, MODERATE STAINING INTENSITY Proliferation Marker Ki67: 40%     03/13/2021 Initial Diagnosis   Malignant neoplasm of upper-outer quadrant of left breast in female, estrogen receptor positive ([place])   03/26/2021 Imaging   MRI Breast  IMPRESSION: 1. 2.5 cm biopsy-proven malignancy within the UPPER LEFT breast. 2. Abnormal lymph node with focal cortical thickening in the posterior UPPER-OUTER LEFT breast/LOWER LEFT axilla. 2nd-look ultrasound with possible biopsy is recommended. 3. Indeterminate 0.8 cm OUTER RIGHT breast mass. 2nd-look ultrasound with possible biopsy is recommended.   03/29/2021 Genetic Testing   Negative hereditary cancer genetic testing: no pathogenic variants detected in [facility] CustomNext-Cancer +RNAinsight Panel.  The report date is March 29, 2021.   The CustomNext-Cancer+RNAinsight panel offered by [facility] includes sequencing and rearrangement analysis for the following 47 genes:  APC, ATM, AXIN2, BARD1, BMPR1A, BRCA1, BRCA2, BRIP1, CDH1, CDK4, CDKN2A, CHEK2, DICER1, EPCAM, GREM1, HOXB13, MEN1, MLH1, MSH2, MSH3, MSH6, MUTYH, NBN, NF1, NF2, NTHL1, PALB2, PMS2, POLD1, POLE, PTEN, RAD51C, RAD51D, RECQL, RET, SDHA, SDHAF2, SDHB, SDHC, SDHD, SMAD4, SMARCA4, STK11, TP53, TSC1, TSC2, and VHL.  RNA data is routinely  analyzed for use in variant interpretation for all genes.   04/04/2021 -  Chemotherapy   Patient is on Treatment Plan : BREAST  Docetaxel + Carboplatin + Trastuzumab + Pertuzumab  (TCHP) q21d      04/14/2021 Imaging   [place] Bilateral Breast  IMPRESSION: 1. No sonographic correlate for the 8 mm enhancing mass in the outer right breast. Recommendation is to prior seed with MRI guided biopsy. 2. An undulating, low lying lymph node along the 1 o'clock axis of the left breast demonstrates no areas of cortical thickening beyond 3 mm.      INTERVAL HISTORY:  BRITNAY MAGNUSSEN is here for a follow up of breast cancer. She was last seen by me on 05/19/21. She presents to the clinic alone. She reports she did not tolerate her last cycle of chemo well-- "the nausea was uncontrollable." She came in for IVF and nausea medicine on several different days. She reports the nausea lasted for almost a week. She notes she lost weight but was able to gain it back the following week. She denies any issues with her bowels; she notes the nausea medication helped control her diarrhea. She also reports her toes were numb/tingling in the last week. She notes her fingers were as well, but not as bad as her feet.   All other systems were reviewed with the patient and are negative.  MEDICAL HISTORY:  Past Medical History:  Diagnosis Date   Anxiety    Breast cancer ([place])    Depression    Family history of colon cancer 03/19/2021   History of kidney stones    Hypertension     SURGICAL HISTORY: Past Surgical History:  Procedure Laterality Date   CHOLECYSTECTOMY  2002   INSERTION OF MESH N/A 10/28/2016   Procedure: INSERTION OF MESH;  Surgeon: Clovis [name], MD;  Location: [facility];  Service: General;  Laterality: N/A;   PORTACATH PLACEMENT Right 04/02/2021   Procedure: INSERTION PORT-A-CATH;  Surgeon: Rolm Bookbinder, MD;  Location: [facility];  Service: General;  Laterality: Right;   [place] N/A 10/28/2016   Procedure: LAPAROSCOPIC VENTRAL HERNIA REPAIR;  Surgeon: Clovis [name], MD;  Location: [facility];  Service: General;  Laterality: N/A;   WRIST SURGERY      I have reviewed the social history and family history with the patient and they are unchanged from previous note.  ALLERGIES:  is allergic to emetrol and no known allergies.  MEDICATIONS:  Current Outpatient Medications  Medication Sig Dispense Refill   potassium chloride SA (KLOR-CON) 20 MEQ tablet Take 1 tablet (20 mEq total) by mouth daily. 20 tablet 1   dexamethasone (DECADRON) 4 MG tablet Take 1 tablet (4 mg total) by mouth 2 (two) times daily. Start the day before Taxotere. Then take daily x 3 days after chemotherapy. 30 tablet 1   diphenoxylate-atropine (LOMOTIL) 2.5-0.025 MG tablet Take 2 tablets by mouth 4 (four) times daily as needed for diarrhea or loose stools. 90 tablet 1   hydrochlorothiazide (HYDRODIURIL) 25 MG tablet Take 25 mg by mouth daily.     lidocaine-prilocaine (EMLA) cream Apply to affected area once 30 g 3   ondansetron (ZOFRAN) 8 MG tablet Take 1 tablet (8 mg total) by mouth 2 (two) times daily as needed (Nausea or vomiting). Start on the third day after chemotherapy. 30 tablet 1   prochlorperazine (COMPAZINE) 10 MG tablet Take 1 tablet (10 mg total) by mouth every 6 (six) hours as needed (Nausea or vomiting). 30 tablet 1   No current facility-administered medications for this visit.   Facility-Administered Medications Ordered in Other Visits  Medication Dose Route Frequency Provider Last Rate Last Admin   CARBOplatin (PARAPLATIN) 900 mg in sodium chloride 0.9 % 500 mL chemo infusion  900 mg Intravenous Once Truitt Merle, MD       dexamethasone (DECADRON) 10 mg in sodium chloride 0.9 % 50 mL IVPB  10 mg Intravenous Once Truitt Merle, MD 204 mL/hr at 06/10/21 1003 10 mg at 06/10/21 1003   DOCEtaxel (TAXOTERE) 140 mg in sodium chloride 0.9 % 250 mL chemo infusion  65 mg/m2  (Treatment Plan Recorded) Intravenous Once Truitt Merle, MD       fosaprepitant (EMEND) 150 mg in sodium chloride 0.9 % 145 mL IVPB  150 mg Intravenous Once Truitt Merle, MD 450 mL/hr at 06/10/21 1004 150 mg at 06/10/21 1004   heparin lock flush 100 unit/mL  500 Units Intracatheter Once PRN Truitt Merle, MD       pertuzumab (PERJETA) 420 mg in sodium chloride 0.9 % 250 mL chemo infusion  420 mg Intravenous Once Truitt Merle, MD       sodium chloride flush (NS) 0.9 % injection 10 mL  10 mL Intracatheter PRN Truitt Merle, MD       trastuzumab-dkst (OGIVRI) 609 mg in  sodium chloride 0.9 % 250 mL chemo infusion  6 mg/kg (Treatment Plan Recorded) Intravenous Once Truitt Merle, MD        PHYSICAL EXAMINATION: ECOG PERFORMANCE STATUS: 1 - Symptomatic but completely ambulatory  Vitals:   06/10/21 0917  BP: 116/84  Pulse: 98  Resp: 17  Temp: 98.3 F (36.8 C)  SpO2: 100%   Wt Readings from Last 3 Encounters:  06/10/21 211 lb 6.4 oz (95.9 kg)  05/26/21 201 lb 8 oz (91.4 kg)  05/19/21 214 lb 12.8 oz (97.4 kg)     GENERAL:alert, no distress and comfortable SKIN: skin color normal, no rashes or significant lesions EYES: normal, Conjunctiva are pink and non-injected, sclera clear  NEURO: alert & oriented x 3 with fluent speech  LABORATORY DATA:  I have reviewed the data as listed CBC Latest Ref Rng & Units 06/10/2021 05/26/2021 05/19/2021  WBC 4.0 - 10.5 K/uL 4.4 2.5(L) 4.2  Hemoglobin 12.0 - 15.0 g/dL 8.6(L) 11.5(L) 9.6(L)  Hematocrit 36.0 - 46.0 % 26.8(L) 34.3(L) 30.2(L)  Platelets 150 - 400 K/uL 256 257 222     CMP Latest Ref Rng & Units 06/10/2021 05/26/2021 05/19/2021  Glucose 70 - 99 mg/dL 170(H) 124(H) 114(H)  BUN 6 - 20 mg/dL 18 19 16   Creatinine 0.44 - 1.00 mg/dL 0.79 1.19(H) 0.68  Sodium 135 - 145 mmol/L 140 136 139  Potassium 3.5 - 5.1 mmol/L 3.1(L) 3.8 3.4(L)  Chloride 98 - 111 mmol/L 100 95(L) 103  CO2 22 - 32 mmol/L 26 28 28   Calcium 8.9 - 10.3 mg/dL 9.2 10.0 9.2  Total Protein 6.5 -  8.1 g/dL 7.1 7.9 6.9  Total Bilirubin 0.3 - 1.2 mg/dL 0.2(L) 0.9 0.6  Alkaline Phos 38 - 126 U/L 61 68 50  AST 15 - 41 U/L 19 22 23   ALT 0 - 44 U/L 21 48(H) 23      RADIOGRAPHIC STUDIES: I have personally reviewed the radiological images as listed and agreed with the findings in the report. No results found.    No orders of the defined types were placed in this encounter.  All questions were answered. The patient knows to call the clinic with any problems, questions or concerns. No barriers to learning was detected. The total time spent in the appointment was 30 minutes.     Truitt Merle, MD 06/10/2021   I, Wilburn Mylar, am acting as scribe for Truitt Merle, MD.   I have reviewed the above documentation for accuracy and completeness, and I agree with the above.

## 2021-06-11 ENCOUNTER — Telehealth: Payer: Self-pay | Admitting: Hematology

## 2021-06-11 NOTE — Telephone Encounter (Signed)
Scheduled per sch msg. Called and spoke with patient. Confirmed appt  

## 2021-06-12 ENCOUNTER — Other Ambulatory Visit: Payer: Self-pay

## 2021-06-12 ENCOUNTER — Ambulatory Visit: Payer: Federal, State, Local not specified - PPO

## 2021-06-12 ENCOUNTER — Inpatient Hospital Stay: Payer: Federal, State, Local not specified - PPO

## 2021-06-12 VITALS — BP 128/86 | HR 89 | Temp 98.4°F | Resp 14 | Wt 212.0 lb

## 2021-06-12 DIAGNOSIS — Z95828 Presence of other vascular implants and grafts: Secondary | ICD-10-CM

## 2021-06-12 DIAGNOSIS — Z5111 Encounter for antineoplastic chemotherapy: Secondary | ICD-10-CM | POA: Diagnosis not present

## 2021-06-12 DIAGNOSIS — Z170 Estrogen receptor positive status [ER+]: Secondary | ICD-10-CM

## 2021-06-12 DIAGNOSIS — R110 Nausea: Secondary | ICD-10-CM | POA: Diagnosis not present

## 2021-06-12 DIAGNOSIS — C50412 Malignant neoplasm of upper-outer quadrant of left female breast: Secondary | ICD-10-CM | POA: Diagnosis not present

## 2021-06-12 DIAGNOSIS — Z5112 Encounter for antineoplastic immunotherapy: Secondary | ICD-10-CM | POA: Diagnosis not present

## 2021-06-12 DIAGNOSIS — Z5189 Encounter for other specified aftercare: Secondary | ICD-10-CM | POA: Diagnosis not present

## 2021-06-12 DIAGNOSIS — Z23 Encounter for immunization: Secondary | ICD-10-CM | POA: Diagnosis not present

## 2021-06-12 MED ORDER — SODIUM CHLORIDE 0.9% FLUSH
10.0000 mL | Freq: Once | INTRAVENOUS | Status: AC | PRN
  Administered 2021-06-12: 11:00:00 10 mL

## 2021-06-12 MED ORDER — SODIUM CHLORIDE 0.9 % IV SOLN: Freq: Once | INTRAVENOUS | Status: AC

## 2021-06-12 MED ORDER — HEPARIN SOD (PORK) LOCK FLUSH 100 UNIT/ML IV SOLN
500.0000 [IU] | Freq: Once | INTRAVENOUS | Status: AC | PRN
  Administered 2021-06-12: 11:00:00 500 [IU]

## 2021-06-12 MED ORDER — PEGFILGRASTIM-CBQV 6 MG/0.6ML ~~LOC~~ SOSY
6.0000 mg | PREFILLED_SYRINGE | Freq: Once | SUBCUTANEOUS | Status: AC
  Administered 2021-06-12: 09:00:00 6 mg via SUBCUTANEOUS
  Filled 2021-06-12: qty 0.6

## 2021-06-16 ENCOUNTER — Inpatient Hospital Stay: Payer: Federal, State, Local not specified - PPO

## 2021-06-16 ENCOUNTER — Encounter: Payer: Self-pay | Admitting: Hematology

## 2021-06-16 ENCOUNTER — Inpatient Hospital Stay (HOSPITAL_BASED_OUTPATIENT_CLINIC_OR_DEPARTMENT_OTHER): Payer: Federal, State, Local not specified - PPO | Admitting: Hematology

## 2021-06-16 ENCOUNTER — Other Ambulatory Visit: Payer: Self-pay

## 2021-06-16 VITALS — BP 123/84 | HR 102 | Temp 98.6°F | Resp 16 | Wt 202.5 lb

## 2021-06-16 DIAGNOSIS — Z5111 Encounter for antineoplastic chemotherapy: Secondary | ICD-10-CM | POA: Diagnosis not present

## 2021-06-16 DIAGNOSIS — Z23 Encounter for immunization: Secondary | ICD-10-CM | POA: Diagnosis not present

## 2021-06-16 DIAGNOSIS — Z170 Estrogen receptor positive status [ER+]: Secondary | ICD-10-CM | POA: Diagnosis not present

## 2021-06-16 DIAGNOSIS — C50412 Malignant neoplasm of upper-outer quadrant of left female breast: Secondary | ICD-10-CM | POA: Diagnosis not present

## 2021-06-16 DIAGNOSIS — Z5112 Encounter for antineoplastic immunotherapy: Secondary | ICD-10-CM | POA: Diagnosis not present

## 2021-06-16 DIAGNOSIS — R110 Nausea: Secondary | ICD-10-CM | POA: Diagnosis not present

## 2021-06-16 DIAGNOSIS — Z95828 Presence of other vascular implants and grafts: Secondary | ICD-10-CM

## 2021-06-16 DIAGNOSIS — Z5189 Encounter for other specified aftercare: Secondary | ICD-10-CM | POA: Diagnosis not present

## 2021-06-16 LAB — BASIC METABOLIC PANEL - CANCER CENTER ONLY
Anion gap: 13 (ref 5–15)
BUN: 19 mg/dL (ref 6–20)
CO2: 23 mmol/L (ref 22–32)
Calcium: 9.8 mg/dL (ref 8.9–10.3)
Chloride: 102 mmol/L (ref 98–111)
Creatinine: 0.82 mg/dL (ref 0.44–1.00)
GFR, Estimated: >60 mL/min (ref >60)
Glucose, Bld: 116 mg/dL — ABNORMAL HIGH (ref 70–99)
Potassium: 4.3 mmol/L (ref 3.5–5.1)
Sodium: 138 mmol/L (ref 135–145)

## 2021-06-16 MED ORDER — SODIUM CHLORIDE 0.9% FLUSH
10.0000 mL | Freq: Once | INTRAVENOUS | Status: AC | PRN
  Administered 2021-06-16: 10 mL

## 2021-06-16 MED ORDER — SODIUM CHLORIDE 0.9 % IV SOLN
16.0000 mg | Freq: Once | INTRAVENOUS | Status: AC
  Administered 2021-06-16: 16 mg via INTRAVENOUS
  Filled 2021-06-16: qty 8

## 2021-06-16 MED ORDER — SODIUM CHLORIDE 0.9 % IV SOLN: Freq: Once | INTRAVENOUS | Status: AC

## 2021-06-16 MED ORDER — SODIUM CHLORIDE 0.9 % IV SOLN
8.0000 mg | Freq: Once | INTRAVENOUS | Status: AC
  Administered 2021-06-16: 8 mg via INTRAVENOUS
  Filled 2021-06-16: qty 0.8

## 2021-06-16 MED ORDER — LORAZEPAM 1 MG PO TABS: 1.0000 mg | ORAL_TABLET | Freq: Three times a day (TID) | ORAL | 0 refills | Status: DC | PRN

## 2021-06-16 MED ORDER — HEPARIN SOD (PORK) LOCK FLUSH 100 UNIT/ML IV SOLN
500.0000 [IU] | Freq: Once | INTRAVENOUS | Status: AC | PRN
  Administered 2021-06-16: 500 [IU]

## 2021-06-16 NOTE — Patient Instructions (Addendum)
Rehydration, Adult Rehydration is the replacement of body fluids, salts, and minerals (electrolytes) that are lost during dehydration. Dehydration is when there is not enough water or other fluids in the body. This happens when you lose more fluids than you take in. Common causes of dehydration include: Not drinking enough fluids. This can occur when you are ill or doing activities that require a lot of energy, especially in hot weather. Conditions that cause loss of water or other fluids, such as diarrhea, vomiting, sweating, or urinating a lot. Other illnesses, such as fever or infection. Certain medicines, such as those that remove excess fluid from the body (diuretics). Symptoms of mild or moderate dehydration may include thirst, dry lips and mouth, and dizziness. Symptoms of severe dehydration may include increased heart rate, confusion, fainting, and not urinating. For severe dehydration, you may need to get fluids through an IV at the hospital. For mild or moderate dehydration, you can usually rehydrate at home by drinking certain fluids as told by your health care provider. What are the risks? Generally, rehydration is safe. However, taking in too much fluid (overhydration) can be a problem. This is rare. Overhydration can cause an electrolyte imbalance, kidney failure, or a decrease in salt (sodium) levels in the body. Supplies needed You will need an oral rehydration solution (ORS) if your health care provider tells you to use one. This is a drink to treat dehydration. It can be found in pharmacies and retail stores. How to rehydrate Fluids Follow instructions from your health care provider for rehydration. The kind of fluid and the amount you should drink depend on your condition. In general, you should choose drinks that you prefer. If told by your health care provider, drink an ORS. Make an ORS by following instructions on the package. Start by drinking small amounts, about  cup (120  mL) every 5-10 minutes. Slowly increase how much you drink until you have taken the amount recommended by your health care provider. Drink enough clear fluids to keep your urine pale yellow. If you were told to drink an ORS, finish it first, then start slowly drinking other clear fluids. Drink fluids such as: Water. This includes sparkling water and flavored water. Drinking only water can lead to having too little sodium in your body (hyponatremia). Follow the advice of your health care provider. Water from ice chips you suck on. Fruit juice with water you add to it (diluted). Sports drinks. Hot or cold herbal teas. Broth-based soups. Milk or milk products. Food Follow instructions from your health care provider about what to eat while you rehydrate. Your health care provider may recommend that you slowly begin eating regular foods in small amounts. Eat foods that contain a healthy balance of electrolytes, such as bananas, oranges, potatoes, tomatoes, and spinach. Avoid foods that are greasy or contain a lot of sugar. In some cases, you may get nutrition through a feeding tube that is passed through your nose and into your stomach (nasogastric tube, or NG tube). This may be done if you have uncontrolled vomiting or diarrhea. Beverages to avoid Certain beverages may make dehydration worse. While you rehydrate, avoid drinking alcohol. How to tell if you are recovering from dehydration You may be recovering from dehydration if: You are urinating more often than before you started rehydrating. Your urine is pale yellow. Your energy level improves. You vomit less frequently. You have diarrhea less frequently. Your appetite improves or returns to normal. You feel less dizzy or less light-headed. Your  skin tone and color start to look more normal. Follow these instructions at home: Take over-the-counter and prescription medicines only as told by your health care provider. Do not take sodium  tablets. Doing this can lead to having too much sodium in your body (hypernatremia). Contact a health care provider if: You continue to have symptoms of mild or moderate dehydration, such as: Thirst. Dry lips. Slightly dry mouth. Dizziness. Dark urine or less urine than normal. Muscle cramps. You continue to vomit or have diarrhea. Get help right away if you: Have symptoms of dehydration that get worse. Have a fever. Have a severe headache. Have been vomiting and the following happens: Your vomiting gets worse or does not go away. Your vomit includes blood or green matter (bile). You cannot eat or drink without vomiting. Have problems with urination or bowel movements, such as: Diarrhea that gets worse or does not go away. Blood in your stool (feces). This may cause stool to look black and tarry. Not urinating, or urinating only a small amount of very dark urine, within 6-8 hours. Have trouble breathing. Have symptoms that get worse with treatment. These symptoms may represent a serious problem that is an emergency. Do not wait to see if the symptoms will go away. Get medical help right away. Call your local emergency services (911 in the [place] Do not drive yourself to the hospital. Summary Rehydration is the replacement of body fluids and minerals (electrolytes) that are lost during dehydration. Follow instructions from your health care provider for rehydration. The kind of fluid and amount you should drink depend on your condition. Slowly increase how much you drink until you have taken the amount recommended by your health care provider. Contact your health care provider if you continue to show signs of mild or moderate dehydration. This information is not intended to replace advice given to you by your health care provider. Make sure you discuss any questions you have with your health care provider. Document Revised: 09/13/2019 Document Reviewed: 07/24/2019 Elsevier Patient  Education  2022 [place]. Nausea, Adult Nausea is the feeling of having an upset stomach or that you are about to vomit. Nausea on its own is not usually a serious concern, but it may be an early sign of a more serious medical problem. As nausea gets worse, it can lead to vomiting. If vomiting develops, or if you are not able to drink enough fluids, you are at risk of becoming dehydrated. Dehydration can make you tired and thirsty, cause you to have a dry mouth, and decrease how often you urinate. Older adults and people with other diseases or a weak disease-fighting system (immune system) are at higher risk for dehydration. The main goals of treating your nausea are: To relieve your nausea. To limit repeated nausea episodes. To prevent vomiting and dehydration. Follow these instructions at home: Watch your symptoms for any changes. Tell your health care provider about them. Eating and drinking   Take an oral rehydration solution (ORS). This is a drink that is sold at pharmacies and retail stores. Drink clear fluids slowly and in small amounts as you are able. Clear fluids include water, ice chips, low-calorie sports drinks, and fruit juice that has water added (diluted fruit juice). Eat bland, easy-to-digest foods in small amounts as you are able. These foods include bananas, applesauce, rice, lean meats, toast, and crackers. Avoid drinking fluids that contain a lot of sugar or caffeine, such as energy drinks, sports drinks, and soda. Avoid alcohol.  Avoid spicy or fatty foods. General instructions Take over-the-counter and prescription medicines only as told by your health care provider. Rest at home while you recover. Drink enough fluid to keep your urine pale yellow. Breathe slowly and deeply when you feel nauseous. Avoid smelling things that have strong odors. Wash your hands often using soap and water for at least 20 seconds. If soap and water are not available, use hand  sanitizer. Make sure that everyone in your household washes their hands well and often. Keep all follow-up visits. This is important. Contact a health care provider if: Your nausea gets worse. Your nausea does not go away after two days. You vomit multiple times. You cannot drink fluids without vomiting. You have any of the following: New symptoms. A fever. A headache. Muscle cramps. A rash. Pain while urinating. You feel light-headed or dizzy. Get help right away if: You have pain in your chest, neck, arm, or jaw. You feel extremely weak or you faint. You have vomit that is bright red or looks like coffee grounds. You have bloody or black stools (feces) or stools that look like tar. You have a severe headache, a stiff neck, or both. You have severe pain, cramping, or bloating in your abdomen. You have difficulty breathing or are breathing very quickly. Your heart is beating very quickly. Your skin feels cold and clammy. You feel confused. You have signs of dehydration, such as: Dark urine, very little urine, or no urine. Cracked lips. Dry mouth. Sunken eyes. Sleepiness. Weakness. These symptoms may be an emergency. Get help right away. Call 911. Do not wait to see if the symptoms will go away. Do not drive yourself to the hospital. Summary Nausea is the feeling that you have an upset stomach or that you are about to vomit. Nausea on its own is not usually a serious concern, but it may be an early sign of a more serious medical problem. If vomiting develops, or if you are not able to drink enough fluids, you are at risk of becoming dehydrated. Follow recommendations for eating and drinking and take over-the-counter and prescription medicines only as told by your health care provider. Contact a health care provider right away if your symptoms worsen or you have new symptoms. Keep all follow-up visits. This is important. This information is not intended to replace advice  given to you by your health care provider. Make sure you discuss any questions you have with your health care provider. Document Revised: 01/17/2021 Document Reviewed: 01/17/2021 Elsevier Patient Education  [place].

## 2021-06-16 NOTE — Progress Notes (Signed)
Dr Burr Medico made aware of vital signs and considerable nausea. Dr Burr Medico is at chairside to evaluate.

## 2021-06-16 NOTE — Progress Notes (Signed)
Nutrition Follow-up:  Patient with breast cancer receiving carbo/taxol, herceptin, perjeta.  Patient followed by Dr Burr Medico.    Met with patient during infusion.  Patient reports chemotherapy has been reduced due to nausea last treatment.  Received IV fluids.  Reports less diarrhea this time.  Reports decreased appetite due to nausea.  Patient says that she has been alternating nausea medication.     Medications: reviewed  Labs: reviewed  Anthropometrics:   Weight 202 lb 8 oz today, decreased  212 lb on 11/17 214 lb 12.8 oz on 10/24 222 lb 1.6 oz on 9/29 217 lb on 9/15  UBW of 227 lb on Aug 2022  NUTRITION DIAGNOSIS: Unintentional weight loss continues   INTERVENTION:  Discussed strategies to help with nausea.  Handout provided     MONITORING, EVALUATION, GOAL: weigh trends, intake   NEXT VISIT: Monday, Dec 5 during infusion  Joanna Osborn, [place], [place] Registered Dietitian [phone] (mobile)

## 2021-06-16 NOTE — Progress Notes (Signed)
[place]   [phone] [phone]   Clinic Follow up Note   Patient Care Team: Lennie Odor, [place] as PCP - General (Physician Assistant) Rockwell Germany, RN as Oncology Nurse Navigator Mauro Kaufmann, RN as Oncology Nurse Navigator Rolm Bookbinder, MD as Consulting Physician (General Surgery) Truitt Merle, MD as Consulting Physician (Hematology) Kyung Rudd, MD as Consulting Physician (Radiation Oncology)  Date of Service:  06/16/2021  CHIEF COMPLAINT: f/u of left breast cancer  CURRENT THERAPY:  Neoadjuvant TCHP (docetaxel, carboplatin, herceptin, perjeta) with GCSF, starting 04/04/21.   ASSESSMENT & PLAN:  Joanna Osborn is a 50 y.o. female with   1. 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 Left mammogram on 03/05/21 showed 2.1 cm mass at 12 o'clock. Biopsy confirmed invasive ductal carcinoma, grade 3, weakly ER+ and Her2+ -Given her Her2+ disease, she began neoadjuvant TCHP q3 weeks for 6 cycles, followed by Herceptin w or wo Perjeta maintenance therapy for a total of one year.   -Breast MRI 03/26/21 showed additional areas of concern-- a left breast/axilla lymph node, and a right breast mass. She is scheduled for second-look ultrasounds and possible biopsies on 04/14/21. If she has positive node disease, we will refer her for staging work-up -echo from 04/02/21 reviewed, no concerns  -She began neoadjuvant [facility] 04/03/21 with G-CSF. She is also receiving IVF and antiemetics on day 3 and 6 of each cycle. Docetaxel dose reduced from C4 due to neuropathy -she returns for IVF today.   2. Symptom Management: diarrhea, fatigue, nausea -she tolerated C1 TCHP relatively well, but experienced diarrhea and fatigue. -I prescribed lomotil on 04/10/21 for her to use. She notes less diarrhea with decreased dose s/p C4. -s/p C3 she experienced  uncontrollable nausea for about a week. She is again experiencing a lot of nausea s/p C4. We will continue to give IVF   3. Anxiety and depression, HTN -f/u PCP -continue HCTZ, will monitor her blood pressure closely during chemotherapy.  Advised her to hold if she is not drinking well or feels dizzy -Social work follow-up   4. Mild Anemia -her hgb has dropped recently secondary to chemo  -she still has periods, bleeding is light -She began oral iron -hgb was 8.6 06/10/21, likely secondary to nausea. We will monitor.    5.  Chemo induced peripheral neuropathy G1  -She noticed numbness on her toes cycle 3 chemo, exam today showed decreased vibration sensation mildly -I recommend her to start a B complex once daily -slightly decreased docetaxel chemo dose with C4    PLAN: -proceed with IVF today with 1L NS, zofran and dexa  -will repeat BMP, she will return tomorrow or IVF again  -I called in ativan as needed for nausea, she will take dexa for 2-3 more days for nausea  -lab, flush, f/u, and TCHP in 2 and 5 weeks with G-CSF and IVF   No problem-specific Assessment & Plan notes found for this encounter.   SUMMARY OF ONCOLOGIC HISTORY: Oncology History Overview Note  Cancer Staging Malignant neoplasm of upper-outer quadrant of left breast in female, estrogen receptor positive ([place]) Staging form: Breast, AJCC 8th Edition - Clinical stage from 03/07/2021: Stage IB (cT2, cN0, cM0, G3, ER+, PR+, HER2+) - Signed by Truitt Merle, MD on 03/17/2021 Stage prefix: Initial diagnosis Histologic grading system: 3 grade system    Malignant neoplasm of upper-outer quadrant of left  breast in female, estrogen receptor positive ([place])  03/05/2021 Mammogram   Diagnostic Left Mammogram; Left Breast Ultrasound  IMPRESSION: At the palpable site of concern in the left breast at 12 o'clock there is a suspicious mass measuring 2.1 cm.   03/07/2021 Cancer Staging   Staging form: Breast, AJCC 8th Edition -  Clinical stage from 03/07/2021: Stage IB (cT2, cN0, cM0, G3, ER+, PR+, HER2+) - Signed by Truitt Merle, MD on 03/17/2021 Stage prefix: Initial diagnosis Histologic grading system: 3 grade system    03/07/2021 Pathology Results   Diagnosis Breast, left, needle core biopsy, left breast 12 o' clock - INVASIVE DUCTAL CARCINOMA - SEE COMMENT Based on the biopsy, the carcinoma appears [place] grade 3 of 3  PROGNOSTIC INDICATORS Results: IMMUNOHISTOCHEMICAL AND MORPHOMETRIC ANALYSIS PERFORMED MANUALLY The tumor cells are POSITIVE for Her2 (3+). Estrogen Receptor: 30%, POSITIVE, WEAK STAINING INTENSITY Progesterone Receptor: 15%, POSITIVE, MODERATE STAINING INTENSITY Proliferation Marker Ki67: 40%     03/13/2021 Initial Diagnosis   Malignant neoplasm of upper-outer quadrant of left breast in female, estrogen receptor positive ([place])   03/26/2021 Imaging   MRI Breast  IMPRESSION: 1. 2.5 cm biopsy-proven malignancy within the UPPER LEFT breast. 2. Abnormal lymph node with focal cortical thickening in the posterior UPPER-OUTER LEFT breast/LOWER LEFT axilla. 2nd-look ultrasound with possible biopsy is recommended. 3. Indeterminate 0.8 cm OUTER RIGHT breast mass. 2nd-look ultrasound with possible biopsy is recommended.   03/29/2021 Genetic Testing   Negative hereditary cancer genetic testing: no pathogenic variants detected in [facility] CustomNext-Cancer +RNAinsight Panel.  The report date is March 29, 2021.   The CustomNext-Cancer+RNAinsight panel offered by [facility] includes sequencing and rearrangement analysis for the following 47 genes:  APC, ATM, AXIN2, BARD1, BMPR1A, BRCA1, BRCA2, BRIP1, CDH1, CDK4, CDKN2A, CHEK2, DICER1, EPCAM, GREM1, HOXB13, MEN1, MLH1, MSH2, MSH3, MSH6, MUTYH, NBN, NF1, NF2, NTHL1, PALB2, PMS2, POLD1, POLE, PTEN, RAD51C, RAD51D, RECQL, RET, SDHA, SDHAF2, SDHB, SDHC, SDHD, SMAD4, SMARCA4, STK11, TP53, TSC1, TSC2, and VHL.  RNA data is routinely analyzed for use in  variant interpretation for all genes.   04/04/2021 -  Chemotherapy   Patient is on Treatment Plan : BREAST  Docetaxel + Carboplatin + Trastuzumab + Pertuzumab  (TCHP) q21d      04/14/2021 Imaging   [place] Bilateral Breast  IMPRESSION: 1. No sonographic correlate for the 8 mm enhancing mass in the outer right breast. Recommendation is to prior seed with MRI guided biopsy. 2. An undulating, low lying lymph node along the 1 o'clock axis of the left breast demonstrates no areas of cortical thickening beyond 3 mm.      INTERVAL HISTORY:  Joanna Osborn is here for a follow up of breast cancer. She was last seen by me on 06/10/21. She was seen in the infusion area. She reports she has had a lot of nausea. She endorses using zofran and compazine; she does not feel one is better than the other. She notes she is also drinking ginger tea. She notes she was not able to sleep well last night. She reports she is able to keep food down during the day, but less so at night. She reports her diarrhea has not been as bad on the reduced dose. She also reports a rash under her left breast.   All other systems were reviewed with the patient and are negative.  MEDICAL HISTORY:  Past Medical History:  Diagnosis Date   Anxiety    Breast cancer ([place])    Depression  Family history of colon cancer 03/19/2021   History of kidney stones    Hypertension     SURGICAL HISTORY: Past Surgical History:  Procedure Laterality Date   CHOLECYSTECTOMY  2002   INSERTION OF MESH N/A 10/28/2016   Procedure: INSERTION OF MESH;  Surgeon: Clovis [name], MD;  Location: [facility];  Service: General;  Laterality: N/A;   PORTACATH PLACEMENT Right 04/02/2021   Procedure: INSERTION PORT-A-CATH;  Surgeon: Rolm Bookbinder, MD;  Location: [facility];  Service: General;  Laterality: Right;   [place] N/A 10/28/2016   Procedure: LAPAROSCOPIC VENTRAL HERNIA REPAIR;  Surgeon: Clovis [name], MD;  Location: [place];  Service: General;  Laterality: N/A;   WRIST SURGERY      I have reviewed the social history and family history with the patient and they are unchanged from previous note.  ALLERGIES:  is allergic to emetrol and no known allergies.  MEDICATIONS:  Current Outpatient Medications  Medication Sig Dispense Refill   LORazepam (ATIVAN) 1 MG tablet Take 1 tablet (1 mg total) by mouth every 8 (eight) hours as needed (nausea). 20 tablet 0   dexamethasone (DECADRON) 4 MG tablet Take 1 tablet (4 mg total) by mouth 2 (two) times daily. Start the day before Taxotere. Then take daily x 3 days after chemotherapy. 30 tablet 1   diphenoxylate-atropine (LOMOTIL) 2.5-0.025 MG tablet Take 2 tablets by mouth 4 (four) times daily as needed for diarrhea or loose stools. 90 tablet 1   hydrochlorothiazide (HYDRODIURIL) 25 MG tablet Take 25 mg by mouth daily.     lidocaine-prilocaine (EMLA) cream Apply to affected area once 30 g 3   ondansetron (ZOFRAN) 8 MG tablet Take 1 tablet (8 mg total) by mouth 2 (two) times daily as needed (Nausea or vomiting). Start on the third day after chemotherapy. 30 tablet 1   potassium chloride SA (KLOR-CON) 20 MEQ tablet Take 1 tablet (20 mEq total) by mouth daily. 20 tablet 1   prochlorperazine (COMPAZINE) 10 MG tablet Take 1 tablet (10 mg total) by mouth every 6 (six) hours as needed (Nausea or vomiting). 30 tablet 1   No current facility-administered medications for this visit.   Facility-Administered Medications Ordered in Other Visits  Medication Dose Route Frequency Provider Last Rate Last Admin   heparin lock flush 100 unit/mL  500 Units Intracatheter Once PRN Truitt Merle, MD       sodium chloride flush (NS) 0.9 % injection 10 mL  10 mL Intracatheter Once PRN Truitt Merle, MD        PHYSICAL EXAMINATION: ECOG PERFORMANCE STATUS: 2 - Symptomatic, <50% confined to bed  There were no vitals filed for this visit. Wt Readings from Last 3 Encounters:   06/16/21 202 lb 8 oz (91.9 kg)  06/12/21 212 lb (96.2 kg)  06/10/21 211 lb 6.4 oz (95.9 kg)     GENERAL:alert, no distress and comfortable SKIN: skin color, texture, turgor are normal, no significant lesions, (+) mild erythema under left breast EYES: normal, Conjunctiva are pink and non-injected, sclera clear NEURO: alert & oriented x 3 with fluent speech, no focal motor/sensory deficits  LABORATORY DATA:  I have reviewed the data as listed CBC Latest Ref Rng & Units 06/10/2021 05/26/2021 05/19/2021  WBC 4.0 - 10.5 K/uL 4.4 2.5(L) 4.2  Hemoglobin 12.0 - 15.0 g/dL 8.6(L) 11.5(L) 9.6(L)  Hematocrit 36.0 - 46.0 % 26.8(L) 34.3(L) 30.2(L)  Platelets 150 - 400 K/uL 256 257  222     CMP Latest Ref Rng & Units 06/16/2021 06/10/2021 05/26/2021  Glucose 70 - 99 mg/dL 116(H) 170(H) 124(H)  BUN 6 - 20 mg/dL _0 Creatinine 0.44 - 1.00 mg/dL 0.82 0.79 1.19(H)  Sodium 135 - 145 mmol/L 138 140 136  Potassium 3.5 - 5.1 mmol/L 4.3 3.1(L) 3.8  Chloride 98 - 111 mmol/L 102 100 95(L)  CO2 22 - 32 mmol/L _1 Calcium 8.9 - 10.3 mg/dL 9.8 9.2 10.0  Total Protein 6.5 - 8.1 g/dL - 7.1 7.9  Total Bilirubin 0.3 - 1.2 mg/dL - 0.2(L) 0.9  Alkaline Phos 38 - 126 U/L - 61 68  AST 15 - 41 U/L - 19 22  ALT 0 - 44 U/L - 21 48(H)      RADIOGRAPHIC STUDIES: I have personally reviewed the radiological images as listed and agreed with the findings in the report. No results found.    Orders Placed This Encounter  Procedures   Basic Metabolic Panel - [place] Only    Standing Status:   Future    Number of Occurrences:   1    Standing Expiration Date:   06/16/2022   All questions were answered. The patient knows to call the clinic with any problems, questions or concerns. No barriers to learning was detected. The total time spent in the appointment was 20 minutes.     Truitt Merle, MD 06/16/2021   I, Wilburn Mylar, am acting as scribe for Truitt Merle, MD.   I have reviewed the above  documentation for accuracy and completeness, and I agree with the above.

## 2021-06-17 ENCOUNTER — Telehealth: Payer: Self-pay | Admitting: Hematology

## 2021-06-17 ENCOUNTER — Inpatient Hospital Stay: Payer: Federal, State, Local not specified - PPO

## 2021-06-17 VITALS — BP 103/73 | HR 105 | Temp 98.2°F | Resp 17

## 2021-06-17 DIAGNOSIS — Z170 Estrogen receptor positive status [ER+]: Secondary | ICD-10-CM | POA: Diagnosis not present

## 2021-06-17 DIAGNOSIS — C50412 Malignant neoplasm of upper-outer quadrant of left female breast: Secondary | ICD-10-CM | POA: Diagnosis not present

## 2021-06-17 DIAGNOSIS — R110 Nausea: Secondary | ICD-10-CM | POA: Diagnosis not present

## 2021-06-17 DIAGNOSIS — Z5112 Encounter for antineoplastic immunotherapy: Secondary | ICD-10-CM | POA: Diagnosis not present

## 2021-06-17 DIAGNOSIS — Z23 Encounter for immunization: Secondary | ICD-10-CM | POA: Diagnosis not present

## 2021-06-17 DIAGNOSIS — Z5111 Encounter for antineoplastic chemotherapy: Secondary | ICD-10-CM | POA: Diagnosis not present

## 2021-06-17 DIAGNOSIS — Z95828 Presence of other vascular implants and grafts: Secondary | ICD-10-CM

## 2021-06-17 DIAGNOSIS — Z5189 Encounter for other specified aftercare: Secondary | ICD-10-CM | POA: Diagnosis not present

## 2021-06-17 MED ORDER — HEPARIN SOD (PORK) LOCK FLUSH 100 UNIT/ML IV SOLN
500.0000 [IU] | Freq: Once | INTRAVENOUS | Status: AC | PRN
  Administered 2021-06-17: 500 [IU]

## 2021-06-17 MED ORDER — SODIUM CHLORIDE 0.9 % IV SOLN: Freq: Once | INTRAVENOUS | Status: AC

## 2021-06-17 MED ORDER — SODIUM CHLORIDE 0.9% FLUSH
10.0000 mL | Freq: Once | INTRAVENOUS | Status: AC | PRN
  Administered 2021-06-17: 10 mL

## 2021-06-17 NOTE — Telephone Encounter (Signed)
Scheduled follow-up appointments per 11/15 los. Patient is aware.

## 2021-06-17 NOTE — Patient Instructions (Signed)

## 2021-06-27 MED FILL — Dexamethasone Sodium Phosphate Inj 100 MG/10ML: INTRAMUSCULAR | Qty: 1 | Status: AC

## 2021-06-27 MED FILL — Fosaprepitant Dimeglumine For IV Infusion 150 MG (Base Eq): INTRAVENOUS | Qty: 5 | Status: AC

## 2021-06-30 ENCOUNTER — Inpatient Hospital Stay: Payer: Federal, State, Local not specified - PPO

## 2021-06-30 ENCOUNTER — Inpatient Hospital Stay: Payer: Federal, State, Local not specified - PPO | Attending: Hematology

## 2021-06-30 ENCOUNTER — Other Ambulatory Visit: Payer: Self-pay | Admitting: *Deleted

## 2021-06-30 ENCOUNTER — Other Ambulatory Visit: Payer: Self-pay

## 2021-06-30 ENCOUNTER — Encounter: Payer: Self-pay | Admitting: *Deleted

## 2021-06-30 ENCOUNTER — Inpatient Hospital Stay (HOSPITAL_BASED_OUTPATIENT_CLINIC_OR_DEPARTMENT_OTHER): Payer: Federal, State, Local not specified - PPO | Admitting: Hematology

## 2021-06-30 ENCOUNTER — Encounter: Payer: Self-pay | Admitting: Hematology

## 2021-06-30 VITALS — BP 132/78 | HR 92 | Temp 98.3°F | Resp 18 | Ht 65.0 in | Wt 214.0 lb

## 2021-06-30 DIAGNOSIS — Z170 Estrogen receptor positive status [ER+]: Secondary | ICD-10-CM

## 2021-06-30 DIAGNOSIS — D6481 Anemia due to antineoplastic chemotherapy: Secondary | ICD-10-CM | POA: Diagnosis not present

## 2021-06-30 DIAGNOSIS — Z5111 Encounter for antineoplastic chemotherapy: Secondary | ICD-10-CM | POA: Diagnosis not present

## 2021-06-30 DIAGNOSIS — Z5112 Encounter for antineoplastic immunotherapy: Secondary | ICD-10-CM | POA: Insufficient documentation

## 2021-06-30 DIAGNOSIS — Z5189 Encounter for other specified aftercare: Secondary | ICD-10-CM | POA: Diagnosis not present

## 2021-06-30 DIAGNOSIS — C50412 Malignant neoplasm of upper-outer quadrant of left female breast: Secondary | ICD-10-CM

## 2021-06-30 DIAGNOSIS — Z95828 Presence of other vascular implants and grafts: Secondary | ICD-10-CM

## 2021-06-30 DIAGNOSIS — R110 Nausea: Secondary | ICD-10-CM | POA: Insufficient documentation

## 2021-06-30 LAB — CMP (CANCER CENTER ONLY)
ALT: 36 U/L (ref 0–44)
AST: 20 U/L (ref 15–41)
Albumin: 3.9 g/dL (ref 3.5–5.0)
Alkaline Phosphatase: 53 U/L (ref 38–126)
Anion gap: 14 (ref 5–15)
BUN: 15 mg/dL (ref 6–20)
CO2: 22 mmol/L (ref 22–32)
Calcium: 9.7 mg/dL (ref 8.9–10.3)
Chloride: 105 mmol/L (ref 98–111)
Creatinine: 0.82 mg/dL (ref 0.44–1.00)
GFR, Estimated: >60 mL/min (ref >60)
Glucose, Bld: 178 mg/dL — ABNORMAL HIGH (ref 70–99)
Potassium: 3.4 mmol/L — ABNORMAL LOW (ref 3.5–5.1)
Sodium: 141 mmol/L (ref 135–145)
Total Bilirubin: 0.4 mg/dL (ref 0.3–1.2)
Total Protein: 6.8 g/dL (ref 6.5–8.1)

## 2021-06-30 LAB — CBC WITH DIFFERENTIAL (CANCER CENTER ONLY)
Abs Immature Granulocytes: 0.03 10*3/uL (ref 0.00–0.07)
Basophils Absolute: 0 10*3/uL (ref 0.0–0.1)
Basophils Relative: 0 %
Eosinophils Absolute: 0 10*3/uL (ref 0.0–0.5)
Eosinophils Relative: 0 %
HCT: 24.9 % — ABNORMAL LOW (ref 36.0–46.0)
Hemoglobin: 8 g/dL — ABNORMAL LOW (ref 12.0–15.0)
Immature Granulocytes: 1 %
Lymphocytes Relative: 37 %
Lymphs Abs: 2.2 10*3/uL (ref 0.7–4.0)
MCH: 30.5 pg (ref 26.0–34.0)
MCHC: 32.1 g/dL (ref 30.0–36.0)
MCV: 95 fL (ref 80.0–100.0)
Monocytes Absolute: 0.4 10*3/uL (ref 0.1–1.0)
Monocytes Relative: 7 %
Neutro Abs: 3.2 10*3/uL (ref 1.7–7.7)
Neutrophils Relative %: 55 %
Platelet Count: 90 10*3/uL — ABNORMAL LOW (ref 150–400)
RBC: 2.62 MIL/uL — ABNORMAL LOW (ref 3.87–5.11)
RDW: 21.1 % — ABNORMAL HIGH (ref 11.5–15.5)
WBC Count: 5.8 10*3/uL (ref 4.0–10.5)
nRBC: 0 % (ref 0.0–0.2)

## 2021-06-30 MED ORDER — PALONOSETRON HCL INJECTION 0.25 MG/5ML
0.2500 mg | Freq: Once | INTRAVENOUS | Status: AC
  Administered 2021-06-30: 0.25 mg via INTRAVENOUS
  Filled 2021-06-30: qty 5

## 2021-06-30 MED ORDER — SODIUM CHLORIDE 0.9 % IV SOLN
420.0000 mg | Freq: Once | INTRAVENOUS | Status: AC
  Administered 2021-06-30: 420 mg via INTRAVENOUS
  Filled 2021-06-30: qty 14

## 2021-06-30 MED ORDER — SODIUM CHLORIDE 0.9 % IV SOLN
150.0000 mg | Freq: Once | INTRAVENOUS | Status: AC
  Administered 2021-06-30: 150 mg via INTRAVENOUS
  Filled 2021-06-30: qty 150

## 2021-06-30 MED ORDER — SODIUM CHLORIDE 0.9 % IV SOLN
750.0000 mg | Freq: Once | INTRAVENOUS | Status: AC
  Administered 2021-06-30: 750 mg via INTRAVENOUS
  Filled 2021-06-30: qty 75

## 2021-06-30 MED ORDER — SODIUM CHLORIDE 0.9% FLUSH
10.0000 mL | Freq: Once | INTRAVENOUS | Status: AC
  Administered 2021-06-30: 10 mL

## 2021-06-30 MED ORDER — SODIUM CHLORIDE 0.9 % IV SOLN
10.0000 mg | Freq: Once | INTRAVENOUS | Status: AC
  Administered 2021-06-30: 10 mg via INTRAVENOUS
  Filled 2021-06-30: qty 10

## 2021-06-30 MED ORDER — TRASTUZUMAB-DKST CHEMO 150 MG IV SOLR
6.0000 mg/kg | Freq: Once | INTRAVENOUS | Status: AC
  Administered 2021-06-30: 609 mg via INTRAVENOUS
  Filled 2021-06-30: qty 29

## 2021-06-30 MED ORDER — SODIUM CHLORIDE 0.9 % IV SOLN: Freq: Once | INTRAVENOUS | Status: AC

## 2021-06-30 MED ORDER — DEXAMETHASONE 4 MG PO TABS: 4.0000 mg | ORAL_TABLET | Freq: Two times a day (BID) | ORAL | 0 refills | Status: DC

## 2021-06-30 MED ORDER — ACETAMINOPHEN 325 MG PO TABS
650.0000 mg | ORAL_TABLET | Freq: Once | ORAL | Status: AC
  Administered 2021-06-30: 650 mg via ORAL
  Filled 2021-06-30: qty 2

## 2021-06-30 MED ORDER — SODIUM CHLORIDE 0.9 % IV SOLN
65.0000 mg/m2 | Freq: Once | INTRAVENOUS | Status: AC
  Administered 2021-06-30: 140 mg via INTRAVENOUS
  Filled 2021-06-30: qty 14

## 2021-06-30 MED ORDER — DIPHENHYDRAMINE HCL 25 MG PO CAPS
50.0000 mg | ORAL_CAPSULE | Freq: Once | ORAL | Status: AC
  Administered 2021-06-30: 50 mg via ORAL
  Filled 2021-06-30: qty 2

## 2021-06-30 NOTE — Progress Notes (Addendum)
[place]   [phone] [phone]   Clinic Follow up Note   Patient Care Team: Lennie Odor, [place] as PCP - General (Physician Assistant) Rockwell Germany, RN as Oncology Nurse Navigator Mauro Kaufmann, RN as Oncology Nurse Navigator Rolm Bookbinder, MD as Consulting Physician (General Surgery) Truitt Merle, MD as Consulting Physician (Hematology) Kyung Rudd, MD as Consulting Physician (Radiation Oncology)  Date of Service:  06/30/2021  CHIEF COMPLAINT: f/u of left breast cancer  CURRENT THERAPY:  Neoadjuvant TCHP (docetaxel, carboplatin, herceptin, perjeta) with GCSF, starting 04/04/21.   ASSESSMENT & PLAN:  Joanna Osborn is a 50 y.o. female with   1. 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 Left mammogram on 03/05/21 showed 2.1 cm mass at 12 o'clock. Biopsy confirmed invasive ductal carcinoma, grade 3, weakly ER+ and Her2+ -Given her Her2+ disease, she began neoadjuvant TCHP q3 weeks for 6 cycles, followed by Herceptin w or wo Perjeta maintenance therapy for a total of one year.   -Breast MRI 03/26/21 showed additional areas of concern-- a left breast/axilla lymph node, and a right breast mass. She is scheduled for second-look ultrasounds and possible biopsies on 04/14/21. If she has positive node disease, we will refer her for staging work-up -echo from 04/02/21 reviewed, no concerns  -She began neoadjuvant [facility] 04/03/21 with G-CSF. She is also receiving IVF and antiemetics on day 3 and 6 of each cycle. Docetaxel dose reduced from C4 due to neuropathy -she has recovered well with IVF. Labs reviewed, overall stable. Will monitor. Adequate to proceed with C5 today.   2. Symptom Management: diarrhea, fatigue, nausea -she tolerated C1 TCHP relatively well, but experienced diarrhea and fatigue. -I prescribed lomotil on 04/10/21 for her to use. She  notes less diarrhea with decreased dose s/p C4. -s/p C3 she experienced uncontrollable nausea for about a week. She is again experiencing a lot of nausea s/p C4. We will continue to give IVF   3. Anxiety and depression, HTN -f/u PCP -continue HCTZ, will monitor her blood pressure closely during chemotherapy.  Advised her to hold if she is not drinking well or feels dizzy -Social work follow-up   4. Mild Anemia -her hgb has dropped recently secondary to chemo  -she still has periods, bleeding is light -She began oral iron -anemia is getting worse, will send blood sample to blood bank next time, she may need transfusion   5. Chemo induced peripheral neuropathy G1  -She noticed numbness on her toes cycle 3 chemo, exam today showed decreased vibration sensation mildly -I recommend her to start a B complex once daily -slightly decreased docetaxel chemo dose with C4     PLAN: -proceed with C5 TCHP today with dose reduction on both TC due to her cytopenia and neuropathy   -G-CSF on day 3  -IVF on days 3 and 6 -lab, flush, f/u, and TCHP in 3 weeks with G-CSF and IVF -sample to blood bank on next visit    No problem-specific Assessment & Plan notes found for this encounter.   SUMMARY OF ONCOLOGIC HISTORY: Oncology History Overview Note  Cancer Staging Malignant neoplasm of upper-outer quadrant of left breast in female, estrogen receptor positive ([place]) Staging form: Breast, AJCC 8th Edition - Clinical stage from 03/07/2021: Stage IB (cT2, cN0, cM0, G3, ER+, PR+, HER2+) - Signed by Truitt Merle, MD on 03/17/2021 Stage prefix: Initial diagnosis Histologic grading system:  3 grade system    Malignant neoplasm of upper-outer quadrant of left breast in female, estrogen receptor positive ([place])  03/05/2021 Mammogram   Diagnostic Left Mammogram; Left Breast Ultrasound  IMPRESSION: At the palpable site of concern in the left breast at 12 o'clock there is a suspicious mass measuring 2.1 cm.    03/07/2021 Cancer Staging   Staging form: Breast, AJCC 8th Edition - Clinical stage from 03/07/2021: Stage IB (cT2, cN0, cM0, G3, ER+, PR+, HER2+) - Signed by Truitt Merle, MD on 03/17/2021 Stage prefix: Initial diagnosis Histologic grading system: 3 grade system    03/07/2021 Pathology Results   Diagnosis Breast, left, needle core biopsy, left breast 12 o' clock - INVASIVE DUCTAL CARCINOMA - SEE COMMENT Based on the biopsy, the carcinoma appears Nottingham grade 3 of 3  PROGNOSTIC INDICATORS Results: IMMUNOHISTOCHEMICAL AND MORPHOMETRIC ANALYSIS PERFORMED MANUALLY The tumor cells are POSITIVE for Her2 (3+). Estrogen Receptor: 30%, POSITIVE, WEAK STAINING INTENSITY Progesterone Receptor: 15%, POSITIVE, MODERATE STAINING INTENSITY Proliferation Marker Ki67: 40%     03/13/2021 Initial Diagnosis   Malignant neoplasm of upper-outer quadrant of left breast in female, estrogen receptor positive ([place])   03/26/2021 Imaging   MRI Breast  IMPRESSION: 1. 2.5 cm biopsy-proven malignancy within the UPPER LEFT breast. 2. Abnormal lymph node with focal cortical thickening in the posterior UPPER-OUTER LEFT breast/LOWER LEFT axilla. 2nd-look ultrasound with possible biopsy is recommended. 3. Indeterminate 0.8 cm OUTER RIGHT breast mass. 2nd-look ultrasound with possible biopsy is recommended.   03/29/2021 Genetic Testing   Negative hereditary cancer genetic testing: no pathogenic variants detected in [facility] CustomNext-Cancer +RNAinsight Panel.  The report date is March 29, 2021.   The CustomNext-Cancer+RNAinsight panel offered by [facility] includes sequencing and rearrangement analysis for the following 47 genes:  APC, ATM, AXIN2, BARD1, BMPR1A, BRCA1, BRCA2, BRIP1, CDH1, CDK4, CDKN2A, CHEK2, DICER1, EPCAM, GREM1, HOXB13, MEN1, MLH1, MSH2, MSH3, MSH6, MUTYH, NBN, NF1, NF2, NTHL1, PALB2, PMS2, POLD1, POLE, PTEN, RAD51C, RAD51D, RECQL, RET, SDHA, SDHAF2, SDHB, SDHC, SDHD, SMAD4, SMARCA4, STK11,  TP53, TSC1, TSC2, and VHL.  RNA data is routinely analyzed for use in variant interpretation for all genes.   04/04/2021 -  Chemotherapy   Patient is on Treatment Plan : BREAST  Docetaxel + Carboplatin + Trastuzumab + Pertuzumab  (TCHP) q21d      04/14/2021 Imaging   [place] Bilateral Breast  IMPRESSION: 1. No sonographic correlate for the 8 mm enhancing mass in the outer right breast. Recommendation is to prior seed with MRI guided biopsy. 2. An undulating, low lying lymph node along the 1 o'clock axis of the left breast demonstrates no areas of cortical thickening beyond 3 mm.      INTERVAL HISTORY:  AVAGAIL WHITTLESEY is here for a follow up of breast cancer. She was last seen by me on 06/16/21. She presents to the clinic alone. She reports she is feeling better. She notes she began to feel better about 3-4 days after her IVF. I asked if she has noticed a change in her breast. She notes she is not feeling what she felt in the beginning. She notes a bump near/on her nipple that she is unsure about. She reports continued tingling in her toes, which is improving.   All other systems were reviewed with the patient and are negative.  MEDICAL HISTORY:  Past Medical History:  Diagnosis Date   Anxiety    Breast cancer ([place])    Depression    Family history of colon cancer 03/19/2021  History of kidney stones    Hypertension     SURGICAL HISTORY: Past Surgical History:  Procedure Laterality Date   CHOLECYSTECTOMY  2002   INSERTION OF MESH N/A 10/28/2016   Procedure: INSERTION OF MESH;  Surgeon: Clovis [name], MD;  Location: [facility];  Service: General;  Laterality: N/A;   PORTACATH PLACEMENT Right 04/02/2021   Procedure: INSERTION PORT-A-CATH;  Surgeon: Rolm Bookbinder, MD;  Location: [facility];  Service: General;  Laterality: Right;   [place] N/A 10/28/2016   Procedure: LAPAROSCOPIC VENTRAL HERNIA REPAIR;  Surgeon: Clovis [name], MD;   Location: [facility];  Service: General;  Laterality: N/A;   WRIST SURGERY      I have reviewed the social history and family history with the patient and they are unchanged from previous note.  ALLERGIES:  is allergic to emetrol and no known allergies.  MEDICATIONS:  Current Outpatient Medications  Medication Sig Dispense Refill   dexamethasone (DECADRON) 4 MG tablet Take 1 tablet (4 mg total) by mouth 2 (two) times daily. Start the day before Taxotere. Then take daily x 3 days after chemotherapy. 10 tablet 0   diphenoxylate-atropine (LOMOTIL) 2.5-0.025 MG tablet Take 2 tablets by mouth 4 (four) times daily as needed for diarrhea or loose stools. 90 tablet 1   hydrochlorothiazide (HYDRODIURIL) 25 MG tablet Take 25 mg by mouth daily.     lidocaine-prilocaine (EMLA) cream Apply to affected area once 30 g 3   LORazepam (ATIVAN) 1 MG tablet Take 1 tablet (1 mg total) by mouth every 8 (eight) hours as needed (nausea). 20 tablet 0   ondansetron (ZOFRAN) 8 MG tablet Take 1 tablet (8 mg total) by mouth 2 (two) times daily as needed (Nausea or vomiting). Start on the third day after chemotherapy. 30 tablet 1   potassium chloride SA (KLOR-CON) 20 MEQ tablet Take 1 tablet (20 mEq total) by mouth daily. 20 tablet 1   prochlorperazine (COMPAZINE) 10 MG tablet Take 1 tablet (10 mg total) by mouth every 6 (six) hours as needed (Nausea or vomiting). 30 tablet 1   No current facility-administered medications for this visit.   Facility-Administered Medications Ordered in Other Visits  Medication Dose Route Frequency Provider Last Rate Last Admin   CARBOplatin (PARAPLATIN) 750 mg in sodium chloride 0.9 % 250 mL chemo infusion  750 mg Intravenous Once Truitt Merle, MD       DOCEtaxel (TAXOTERE) 140 mg in sodium chloride 0.9 % 250 mL chemo infusion  65 mg/m2 (Treatment Plan Recorded) Intravenous Once Truitt Merle, MD 264 mL/hr at 06/30/21 1252 140 mg at 06/30/21 1252    PHYSICAL EXAMINATION: ECOG PERFORMANCE STATUS: 1  - Symptomatic but completely ambulatory  Vitals:   06/30/21 0944  BP: 132/78  Pulse: 92  Resp: 18  Temp: 98.3 F (36.8 C)  SpO2: 100%   Wt Readings from Last 3 Encounters:  06/30/21 214 lb (97.1 kg)  06/16/21 202 lb 8 oz (91.9 kg)  06/12/21 212 lb (96.2 kg)     GENERAL:alert, no distress and comfortable SKIN: skin color, texture, turgor are normal, no rashes or significant lesions EYES: normal, Conjunctiva are pink and non-injected, sclera clear  NECK: supple, thyroid normal size, non-tender, without nodularity LYMPH:  no palpable lymphadenopathy in the cervical, axillary  LUNGS: clear to auscultation and percussion with normal breathing effort HEART: regular rate & rhythm and no murmurs and no lower extremity edema ABDOMEN:abdomen soft, non-tender and normal  bowel sounds Musculoskeletal:no cyanosis of digits and no clubbing  NEURO: alert & oriented x 3 with fluent speech, no focal motor/sensory deficits BREAST: some soft tissue fullness in left breast at 12-1 o'clock, no discrete mass. Otherwise, no palpable mass, nodules or adenopathy bilaterally.  LABORATORY DATA:  I have reviewed the data as listed CBC Latest Ref Rng & Units 06/30/2021 06/10/2021 05/26/2021  WBC 4.0 - 10.5 K/uL 5.8 4.4 2.5(L)  Hemoglobin 12.0 - 15.0 g/dL 8.0(L) 8.6(L) 11.5(L)  Hematocrit 36.0 - 46.0 % 24.9(L) 26.8(L) 34.3(L)  Platelets 150 - 400 K/uL 90(L) 256 257     CMP Latest Ref Rng & Units 06/30/2021 06/16/2021 06/10/2021  Glucose 70 - 99 mg/dL 178(H) 116(H) 170(H)  BUN 6 - 20 mg/dL _0 Creatinine 0.44 - 1.00 mg/dL 0.82 0.82 0.79  Sodium 135 - 145 mmol/L 141 138 140  Potassium 3.5 - 5.1 mmol/L 3.4(L) 4.3 3.1(L)  Chloride 98 - 111 mmol/L 105 102 100  CO2 22 - 32 mmol/L _1 Calcium 8.9 - 10.3 mg/dL 9.7 9.8 9.2  Total Protein 6.5 - 8.1 g/dL 6.8 - 7.1  Total Bilirubin 0.3 - 1.2 mg/dL 0.4 - 0.2(L)  Alkaline Phos 38 - 126 U/L 53 - 61  AST 15 - 41 U/L 20 - 19  ALT 0 - 44 U/L 36 - 21       RADIOGRAPHIC STUDIES: I have personally reviewed the radiological images as listed and agreed with the findings in the report. No results found.    No orders of the defined types were placed in this encounter.  All questions were answered. The patient knows to call the clinic with any problems, questions or concerns. No barriers to learning was detected. The total time spent in the appointment was 30 minutes.     Truitt Merle, MD 06/30/2021   I, Wilburn Mylar, am acting as scribe for Truitt Merle, MD.  I have reviewed the above documentation for accuracy and completeness, and I agree with the above.

## 2021-06-30 NOTE — Progress Notes (Signed)
Nutrition Follow-up:  Patient with breast cancer receiving carbo/taxol, herceptin, perjeta.  Patient following by Dr Burr Medico.    Met with patient during infusion.  Patient reports that her appetite has been better this cycle.  Still having some nausea but sucks on SF jolly ranchers if does not have nausea medication on hand.  Has been eating chicken, eggs, homemade pizza, grilled cheese sandwiches recently.  Drinking pedialyte, water and cranberry juice (UTI prevention)    Medications: reviewed  Labs: K 3.4, glucose 178  Anthropometrics:   Weight 214 lb today  202 lb 8 oz on 11/21 212 lb on 11/17 214 lb 12.8 oz on 10/24 222 lb 1.6 oz on 9/29 217 lb on 9/15  NUTRITION DIAGNOSIS: Unintentional weight loss continues   INTERVENTION:  Encouraged patient to continue good sources of protein. Patient interested in lower sugar cranberry juice options.  RD discussed options.   Patient interested in knowing nutritional side effects from radiation if any.   Patient has contact information    MONITORING, EVALUATION, GOAL: weight trends, intake   NEXT VISIT: Thursday, Dec 29th during infusion  Emeline Simpson B. Zenia Resides, [place], [place] Registered Dietitian [phone] (mobile)

## 2021-06-30 NOTE — Progress Notes (Signed)
Proceed with chemo today with pltc = 90. Carboplatin will be dose reduced.  Raul Del Matthews, [place], BCPS, BCOP 06/30/2021 10:26 AM

## 2021-06-30 NOTE — Patient Instructions (Signed)
[facility]COL[facility]  Discharge Instructions: Thank you for choosing [facility] to provide your oncology and hematology care.   If you have a lab appointment with the [facility], please go directly to the [facility] and check in at the registration area.   Wear comfortable clothing and clothing appropriate for easy access to any Portacath or PICC line.   We strive to give you quality time with your provider. You may need to reschedule your appointment if you arrive late (15 or more minutes).  Arriving late affects you and other patients whose appointments are after yours.  Also, if you miss three or more appointments without notifying the office, you may be dismissed from the clinic at the provider's discretion.      For prescription refill requests, have your pharmacy contact our office and allow 72 hours for refills to be completed.    Today you received the following chemotherapy and/or immunotherapy agents: trastuzumab, pertuzumab, docetaxel, carboplatin      To help prevent nausea and vomiting after your treatment, we encourage you to take your nausea medication as directed.  BELOW ARE SYMPTOMS THAT SHOULD BE REPORTED IMMEDIATELY: *FEVER GREATER THAN 100.4 F (38 C) OR HIGHER *CHILLS OR SWEATING *NAUSEA AND VOMITING THAT IS NOT CONTROLLED WITH YOUR NAUSEA MEDICATION *UNUSUAL SHORTNESS OF BREATH *UNUSUAL BRUISING OR BLEEDING *URINARY PROBLEMS (pain or burning when urinating, or frequent urination) *BOWEL PROBLEMS (unusual diarrhea, constipation, pain near the anus) TENDERNESS IN MOUTH AND THROAT WITH OR WITHOUT PRESENCE OF ULCERS (sore throat, sores in mouth, or a toothache) UNUSUAL RASH, SWELLING OR PAIN  UNUSUAL VAGINAL DISCHARGE OR ITCHING   Items with * indicate a potential emergency and should be followed up as soon as possible or go to the Emergency Department if any problems should occur.  Please show the CHEMOTHERAPY ALERT CARD or  IMMUNOTHERAPY ALERT CARD at check-in to the Emergency Department and triage nurse.  Should you have questions after your visit or need to cancel or reschedule your appointment, please contact [facility]  Dept: [phone]  and follow the prompts.  Office hours are 8:00 a.m. to 4:30 p.m. Monday - Friday. Please note that voicemails left after 4:00 p.m. may not be returned until the following business day.  We are closed weekends and major holidays. You have access to a nurse at all times for urgent questions. Please call the main number to the clinic Dept: [phone] and follow the prompts.   For any non-urgent questions, you may also contact your provider using MyChart. We now offer e-Visits for anyone 83 and older to request care online for non-urgent symptoms. For details visit [email or web address].   Also download the MyChart app! Go to the app store, search "MyChart", open the app, select , and log in with your MyChart username and password.  Due to Covid, a mask is required upon entering the hospital/clinic. If you do not have a mask, one will be given to you upon arrival. For doctor visits, patients may have 1 support person aged 82 or older with them. For treatment visits, patients cannot have anyone with them due to current Covid guidelines and our immunocompromised population.

## 2021-07-02 ENCOUNTER — Other Ambulatory Visit: Payer: Self-pay

## 2021-07-02 ENCOUNTER — Inpatient Hospital Stay: Payer: Federal, State, Local not specified - PPO

## 2021-07-02 ENCOUNTER — Telehealth: Payer: Self-pay | Admitting: *Deleted

## 2021-07-02 VITALS — BP 127/93 | HR 97 | Temp 98.6°F | Resp 18 | Ht 65.0 in | Wt 214.8 lb

## 2021-07-02 DIAGNOSIS — Z5111 Encounter for antineoplastic chemotherapy: Secondary | ICD-10-CM | POA: Diagnosis not present

## 2021-07-02 DIAGNOSIS — Z95828 Presence of other vascular implants and grafts: Secondary | ICD-10-CM

## 2021-07-02 DIAGNOSIS — R110 Nausea: Secondary | ICD-10-CM | POA: Diagnosis not present

## 2021-07-02 DIAGNOSIS — D6481 Anemia due to antineoplastic chemotherapy: Secondary | ICD-10-CM | POA: Diagnosis not present

## 2021-07-02 DIAGNOSIS — Z5189 Encounter for other specified aftercare: Secondary | ICD-10-CM | POA: Diagnosis not present

## 2021-07-02 DIAGNOSIS — C50412 Malignant neoplasm of upper-outer quadrant of left female breast: Secondary | ICD-10-CM | POA: Diagnosis not present

## 2021-07-02 DIAGNOSIS — Z5112 Encounter for antineoplastic immunotherapy: Secondary | ICD-10-CM | POA: Diagnosis not present

## 2021-07-02 DIAGNOSIS — Z170 Estrogen receptor positive status [ER+]: Secondary | ICD-10-CM | POA: Diagnosis not present

## 2021-07-02 MED ORDER — PEGFILGRASTIM-CBQV 6 MG/0.6ML ~~LOC~~ SOSY
6.0000 mg | PREFILLED_SYRINGE | Freq: Once | SUBCUTANEOUS | Status: AC
  Administered 2021-07-02: 6 mg via SUBCUTANEOUS
  Filled 2021-07-02: qty 0.6

## 2021-07-02 MED ORDER — SODIUM CHLORIDE 0.9 % IV SOLN: Freq: Once | INTRAVENOUS | Status: AC

## 2021-07-02 MED ORDER — HEPARIN SOD (PORK) LOCK FLUSH 100 UNIT/ML IV SOLN
500.0000 [IU] | Freq: Once | INTRAVENOUS | Status: AC | PRN
  Administered 2021-07-02: 500 [IU]

## 2021-07-02 MED ORDER — SODIUM CHLORIDE 0.9% FLUSH
10.0000 mL | Freq: Once | INTRAVENOUS | Status: AC | PRN
  Administered 2021-07-02: 10 mL

## 2021-07-02 NOTE — Patient Instructions (Signed)

## 2021-07-02 NOTE — Telephone Encounter (Signed)
Called pt, left vm with appt date and time to see Dr. Donne Hazel after chemo to discuss final sx plan. Provided directions as well. Encourage pt to call with further needs or questions

## 2021-07-04 ENCOUNTER — Inpatient Hospital Stay: Payer: Federal, State, Local not specified - PPO

## 2021-07-04 ENCOUNTER — Other Ambulatory Visit: Payer: Self-pay

## 2021-07-04 DIAGNOSIS — D6481 Anemia due to antineoplastic chemotherapy: Secondary | ICD-10-CM | POA: Diagnosis not present

## 2021-07-04 DIAGNOSIS — Z5189 Encounter for other specified aftercare: Secondary | ICD-10-CM | POA: Diagnosis not present

## 2021-07-04 DIAGNOSIS — C50412 Malignant neoplasm of upper-outer quadrant of left female breast: Secondary | ICD-10-CM | POA: Diagnosis not present

## 2021-07-04 DIAGNOSIS — Z170 Estrogen receptor positive status [ER+]: Secondary | ICD-10-CM | POA: Diagnosis not present

## 2021-07-04 DIAGNOSIS — Z5112 Encounter for antineoplastic immunotherapy: Secondary | ICD-10-CM | POA: Diagnosis not present

## 2021-07-04 DIAGNOSIS — Z5111 Encounter for antineoplastic chemotherapy: Secondary | ICD-10-CM | POA: Diagnosis not present

## 2021-07-04 DIAGNOSIS — Z95828 Presence of other vascular implants and grafts: Secondary | ICD-10-CM

## 2021-07-04 DIAGNOSIS — R110 Nausea: Secondary | ICD-10-CM | POA: Diagnosis not present

## 2021-07-04 MED ORDER — SODIUM CHLORIDE 0.9 % IV SOLN: Freq: Once | INTRAVENOUS | Status: AC

## 2021-07-04 MED ORDER — SODIUM CHLORIDE 0.9% FLUSH
10.0000 mL | Freq: Once | INTRAVENOUS | Status: AC | PRN
  Administered 2021-07-04: 10 mL

## 2021-07-04 MED ORDER — SODIUM CHLORIDE 0.9% FLUSH: 10.0000 mL | Freq: Once | INTRAVENOUS | Status: DC

## 2021-07-04 MED ORDER — HEPARIN SOD (PORK) LOCK FLUSH 100 UNIT/ML IV SOLN: 250.0000 [IU] | Freq: Once | INTRAVENOUS | Status: DC | PRN

## 2021-07-04 MED ORDER — HEPARIN SOD (PORK) LOCK FLUSH 100 UNIT/ML IV SOLN: 500.0000 [IU] | Freq: Once | INTRAVENOUS | Status: DC

## 2021-07-04 MED ORDER — HEPARIN SOD (PORK) LOCK FLUSH 100 UNIT/ML IV SOLN
500.0000 [IU] | Freq: Once | INTRAVENOUS | Status: AC | PRN
  Administered 2021-07-04: 500 [IU]

## 2021-07-04 MED ORDER — SODIUM CHLORIDE 0.9% FLUSH: 3.0000 mL | Freq: Once | INTRAVENOUS | Status: DC | PRN

## 2021-07-04 MED ORDER — ALTEPLASE 2 MG IJ SOLR: 2.0000 mg | Freq: Once | INTRAMUSCULAR | Status: DC | PRN

## 2021-07-04 NOTE — Patient Instructions (Signed)

## 2021-07-07 ENCOUNTER — Ambulatory Visit: Payer: Federal, State, Local not specified - PPO | Admitting: Hematology

## 2021-07-07 ENCOUNTER — Ambulatory Visit: Payer: Federal, State, Local not specified - PPO

## 2021-07-07 ENCOUNTER — Other Ambulatory Visit: Payer: Federal, State, Local not specified - PPO

## 2021-07-09 ENCOUNTER — Ambulatory Visit: Payer: Federal, State, Local not specified - PPO

## 2021-07-12 ENCOUNTER — Ambulatory Visit: Payer: Federal, State, Local not specified - PPO

## 2021-07-16 ENCOUNTER — Other Ambulatory Visit: Payer: Self-pay

## 2021-07-16 ENCOUNTER — Telehealth: Payer: Self-pay

## 2021-07-16 DIAGNOSIS — C50412 Malignant neoplasm of upper-outer quadrant of left female breast: Secondary | ICD-10-CM

## 2021-07-16 DIAGNOSIS — Z170 Estrogen receptor positive status [ER+]: Secondary | ICD-10-CM

## 2021-07-16 DIAGNOSIS — Z01419 Encounter for gynecological examination (general) (routine) without abnormal findings: Secondary | ICD-10-CM | POA: Diagnosis not present

## 2021-07-16 NOTE — Telephone Encounter (Signed)
Pt called stating her Hgb is 7.6 today.  Pt had labs drawn while at her OBGYN's office and they notified her of her critical lab value.  Pt called Dr. Burr Medico to notify Dr Burr Medico of her Hbg.  This RN consulted with [facility] to see if they have availability sometime this week.  Infusion is able to see pt on Saturday 07/19/2022 @0800  for 2units of PRBCs.  Scheduled pt for confirmatory blood draw on 07/17/2021 at 0815 in [facility] lab.  Spoke with pt via telephone to confirm appt dates and times.  Pt verbally confirmed appts.

## 2021-07-17 ENCOUNTER — Other Ambulatory Visit: Payer: Self-pay

## 2021-07-17 ENCOUNTER — Inpatient Hospital Stay: Payer: Federal, State, Local not specified - PPO

## 2021-07-17 DIAGNOSIS — Z95828 Presence of other vascular implants and grafts: Secondary | ICD-10-CM

## 2021-07-17 DIAGNOSIS — Z5111 Encounter for antineoplastic chemotherapy: Secondary | ICD-10-CM | POA: Diagnosis not present

## 2021-07-17 DIAGNOSIS — C50412 Malignant neoplasm of upper-outer quadrant of left female breast: Secondary | ICD-10-CM

## 2021-07-17 DIAGNOSIS — Z170 Estrogen receptor positive status [ER+]: Secondary | ICD-10-CM | POA: Diagnosis not present

## 2021-07-17 DIAGNOSIS — D649 Anemia, unspecified: Secondary | ICD-10-CM

## 2021-07-17 DIAGNOSIS — R110 Nausea: Secondary | ICD-10-CM | POA: Diagnosis not present

## 2021-07-17 DIAGNOSIS — D6481 Anemia due to antineoplastic chemotherapy: Secondary | ICD-10-CM | POA: Diagnosis not present

## 2021-07-17 DIAGNOSIS — Z5112 Encounter for antineoplastic immunotherapy: Secondary | ICD-10-CM | POA: Diagnosis not present

## 2021-07-17 DIAGNOSIS — Z5189 Encounter for other specified aftercare: Secondary | ICD-10-CM | POA: Diagnosis not present

## 2021-07-17 LAB — CBC WITH DIFFERENTIAL (CANCER CENTER ONLY)
Abs Immature Granulocytes: 0.02 10*3/uL (ref 0.00–0.07)
Basophils Absolute: 0 10*3/uL (ref 0.0–0.1)
Basophils Relative: 0 %
Eosinophils Absolute: 0 10*3/uL (ref 0.0–0.5)
Eosinophils Relative: 0 %
HCT: 20.9 % — ABNORMAL LOW (ref 36.0–46.0)
Hemoglobin: 6.8 g/dL — CL (ref 12.0–15.0)
Immature Granulocytes: 1 %
Lymphocytes Relative: 38 %
Lymphs Abs: 1.5 10*3/uL (ref 0.7–4.0)
MCH: 32.1 pg (ref 26.0–34.0)
MCHC: 32.5 g/dL (ref 30.0–36.0)
MCV: 98.6 fL (ref 80.0–100.0)
Monocytes Absolute: 0.4 10*3/uL (ref 0.1–1.0)
Monocytes Relative: 10 %
Neutro Abs: 2 10*3/uL (ref 1.7–7.7)
Neutrophils Relative %: 51 %
Platelet Count: 68 10*3/uL — ABNORMAL LOW (ref 150–400)
RBC: 2.12 MIL/uL — ABNORMAL LOW (ref 3.87–5.11)
RDW: 20.1 % — ABNORMAL HIGH (ref 11.5–15.5)
WBC Count: 3.9 10*3/uL — ABNORMAL LOW (ref 4.0–10.5)
nRBC: 0 % (ref 0.0–0.2)

## 2021-07-17 LAB — SAMPLE TO BLOOD BANK

## 2021-07-17 LAB — PREPARE RBC (CROSSMATCH)

## 2021-07-17 MED ORDER — HEPARIN SOD (PORK) LOCK FLUSH 100 UNIT/ML IV SOLN
500.0000 [IU] | Freq: Once | INTRAVENOUS | Status: AC
  Administered 2021-07-17: 09:00:00 500 [IU]

## 2021-07-17 MED ORDER — SODIUM CHLORIDE 0.9% FLUSH
10.0000 mL | Freq: Once | INTRAVENOUS | Status: AC
  Administered 2021-07-17: 09:00:00 10 mL

## 2021-07-17 NOTE — Progress Notes (Signed)
Order for 2 units of PRBCs placed and released.  Prepare & T&S released and verified by Beth in Blood Bank.  Blood will be ready to transfuse on Saturday.

## 2021-07-18 ENCOUNTER — Inpatient Hospital Stay: Payer: Federal, State, Local not specified - PPO

## 2021-07-18 VITALS — BP 125/84 | HR 101 | Temp 98.6°F | Resp 16

## 2021-07-18 DIAGNOSIS — Z5112 Encounter for antineoplastic immunotherapy: Secondary | ICD-10-CM | POA: Diagnosis not present

## 2021-07-18 DIAGNOSIS — C50412 Malignant neoplasm of upper-outer quadrant of left female breast: Secondary | ICD-10-CM | POA: Diagnosis not present

## 2021-07-18 DIAGNOSIS — Z5111 Encounter for antineoplastic chemotherapy: Secondary | ICD-10-CM | POA: Diagnosis not present

## 2021-07-18 DIAGNOSIS — D649 Anemia, unspecified: Secondary | ICD-10-CM

## 2021-07-18 DIAGNOSIS — Z5189 Encounter for other specified aftercare: Secondary | ICD-10-CM | POA: Diagnosis not present

## 2021-07-18 DIAGNOSIS — Z170 Estrogen receptor positive status [ER+]: Secondary | ICD-10-CM | POA: Diagnosis not present

## 2021-07-18 DIAGNOSIS — R110 Nausea: Secondary | ICD-10-CM | POA: Diagnosis not present

## 2021-07-18 DIAGNOSIS — Z95828 Presence of other vascular implants and grafts: Secondary | ICD-10-CM

## 2021-07-18 DIAGNOSIS — D6481 Anemia due to antineoplastic chemotherapy: Secondary | ICD-10-CM | POA: Diagnosis not present

## 2021-07-18 MED ORDER — SODIUM CHLORIDE 0.9% IV SOLUTION
250.0000 mL | Freq: Once | INTRAVENOUS | Status: AC
  Administered 2021-07-18: 09:00:00 250 mL via INTRAVENOUS

## 2021-07-18 MED ORDER — DIPHENHYDRAMINE HCL 50 MG/ML IJ SOLN
25.0000 mg | Freq: Two times a day (BID) | INTRAMUSCULAR | Status: DC | PRN
  Administered 2021-07-18: 09:00:00 25 mg via INTRAVENOUS
  Filled 2021-07-18: qty 1

## 2021-07-18 MED ORDER — HEPARIN SOD (PORK) LOCK FLUSH 100 UNIT/ML IV SOLN
500.0000 [IU] | Freq: Once | INTRAVENOUS | Status: AC | PRN
  Administered 2021-07-18: 14:00:00 500 [IU]

## 2021-07-18 MED ORDER — SODIUM CHLORIDE 0.9 % IV SOLN: Freq: Once | INTRAVENOUS | Status: AC

## 2021-07-18 MED ORDER — SODIUM CHLORIDE 0.9% FLUSH
10.0000 mL | Freq: Once | INTRAVENOUS | Status: AC | PRN
  Administered 2021-07-18: 14:00:00 10 mL

## 2021-07-18 MED ORDER — METHYLPREDNISOLONE SODIUM SUCC 125 MG IJ SOLR
125.0000 mg | INTRAMUSCULAR | Status: DC | PRN
  Administered 2021-07-18: 10:00:00 125 mg via INTRAVENOUS
  Filled 2021-07-18: qty 2

## 2021-07-18 NOTE — Patient Instructions (Signed)
Blood Transfusion, Adult, Care After This sheet gives you information about how to care for yourself after your procedure. Your doctor may also give you more specific instructions. If you have problems or questions, contact your doctor. What can I expect after the procedure? After the procedure, it is common to have: Bruising and soreness at the IV site. A headache. Follow these instructions at home: Insertion site care   Follow instructions from your doctor about how to take care of your insertion site. This is where an IV tube was put into your vein. Make sure you: Wash your hands with soap and water before and after you change your bandage (dressing). If you cannot use soap and water, use hand sanitizer. Change your bandage as told by your doctor. Check your insertion site every day for signs of infection. Check for: Redness, swelling, or pain. Bleeding from the site. Warmth. Pus or a bad smell. General instructions Take over-the-counter and prescription medicines only as told by your doctor. Rest as told by your doctor. Go back to your normal activities as told by your doctor. Keep all follow-up visits as told by your doctor. This is important. Contact a doctor if: You have itching or red, swollen areas of skin (hives). You feel worried or nervous (anxious). You feel weak after doing your normal activities. You have redness, swelling, warmth, or pain around the insertion site. You have blood coming from the insertion site, and the blood does not stop with pressure. You have pus or a bad smell coming from the insertion site. Get help right away if: You have signs of a serious reaction. This may be coming from an allergy or the body's defense system (immune system). Signs include: Trouble breathing or shortness of breath. Swelling of the face or feeling warm (flushed). Fever or chills. Head, chest, or back pain. Dark pee (urine) or blood in the pee. Widespread rash. Fast  heartbeat. Feeling dizzy or light-headed. You may receive your blood transfusion in an outpatient setting. If so, you will be told whom to contact to report any reactions. These symptoms may be an emergency. Do not wait to see if the symptoms will go away. Get medical help right away. Call your local emergency services (911 in the [place] Do not drive yourself to the hospital. Summary Bruising and soreness at the IV site are common. Check your insertion site every day for signs of infection. Rest as told by your doctor. Go back to your normal activities as told by your doctor. Get help right away if you have signs of a serious reaction. This information is not intended to replace advice given to you by your health care provider. Make sure you discuss any questions you have with your health care provider. Document Revised: 11/07/2020 Document Reviewed: 01/05/2019 Elsevier Patient Education  2022 [employer].  

## 2021-07-19 ENCOUNTER — Inpatient Hospital Stay: Payer: Federal, State, Local not specified - PPO

## 2021-07-20 LAB — TYPE AND SCREEN
ABO/RH(D): A POS
Antibody Screen: NEGATIVE
Unit division: 0
Unit division: 0

## 2021-07-20 LAB — BPAM RBC
Blood Product Expiration Date: 202212282359
Blood Product Expiration Date: 202301162359
ISSUE DATE / TIME: 202212231000
ISSUE DATE / TIME: 202212231000
Unit Type and Rh: 6200
Unit Type and Rh: 6200

## 2021-07-23 ENCOUNTER — Encounter: Payer: Self-pay | Admitting: *Deleted

## 2021-07-24 ENCOUNTER — Inpatient Hospital Stay (HOSPITAL_BASED_OUTPATIENT_CLINIC_OR_DEPARTMENT_OTHER): Payer: Federal, State, Local not specified - PPO | Admitting: Hematology

## 2021-07-24 ENCOUNTER — Inpatient Hospital Stay: Payer: Federal, State, Local not specified - PPO | Admitting: Nutrition

## 2021-07-24 ENCOUNTER — Encounter: Payer: Self-pay | Admitting: Hematology

## 2021-07-24 ENCOUNTER — Inpatient Hospital Stay: Payer: Federal, State, Local not specified - PPO

## 2021-07-24 ENCOUNTER — Other Ambulatory Visit: Payer: Self-pay

## 2021-07-24 VITALS — BP 114/85 | HR 99 | Resp 18 | Wt 211.2 lb

## 2021-07-24 DIAGNOSIS — Z5189 Encounter for other specified aftercare: Secondary | ICD-10-CM | POA: Diagnosis not present

## 2021-07-24 DIAGNOSIS — Z170 Estrogen receptor positive status [ER+]: Secondary | ICD-10-CM

## 2021-07-24 DIAGNOSIS — R110 Nausea: Secondary | ICD-10-CM | POA: Diagnosis not present

## 2021-07-24 DIAGNOSIS — D6481 Anemia due to antineoplastic chemotherapy: Secondary | ICD-10-CM | POA: Diagnosis not present

## 2021-07-24 DIAGNOSIS — Z5112 Encounter for antineoplastic immunotherapy: Secondary | ICD-10-CM | POA: Diagnosis not present

## 2021-07-24 DIAGNOSIS — C50412 Malignant neoplasm of upper-outer quadrant of left female breast: Secondary | ICD-10-CM | POA: Diagnosis not present

## 2021-07-24 DIAGNOSIS — Z95828 Presence of other vascular implants and grafts: Secondary | ICD-10-CM

## 2021-07-24 DIAGNOSIS — Z5111 Encounter for antineoplastic chemotherapy: Secondary | ICD-10-CM | POA: Diagnosis not present

## 2021-07-24 LAB — CBC WITH DIFFERENTIAL (CANCER CENTER ONLY)
Abs Immature Granulocytes: 0.4 10*3/uL — ABNORMAL HIGH (ref 0.00–0.07)
Basophils Absolute: 0 10*3/uL (ref 0.0–0.1)
Basophils Relative: 0 %
Eosinophils Absolute: 0 10*3/uL (ref 0.0–0.5)
Eosinophils Relative: 0 %
HCT: 29.8 % — ABNORMAL LOW (ref 36.0–46.0)
Hemoglobin: 9.8 g/dL — ABNORMAL LOW (ref 12.0–15.0)
Immature Granulocytes: 4 %
Lymphocytes Relative: 35 %
Lymphs Abs: 3.6 10*3/uL (ref 0.7–4.0)
MCH: 31.9 pg (ref 26.0–34.0)
MCHC: 32.9 g/dL (ref 30.0–36.0)
MCV: 97.1 fL (ref 80.0–100.0)
Monocytes Absolute: 0.7 10*3/uL (ref 0.1–1.0)
Monocytes Relative: 7 %
Neutro Abs: 5.7 10*3/uL (ref 1.7–7.7)
Neutrophils Relative %: 54 %
Platelet Count: 359 10*3/uL (ref 150–400)
RBC: 3.07 MIL/uL — ABNORMAL LOW (ref 3.87–5.11)
RDW: 19.4 % — ABNORMAL HIGH (ref 11.5–15.5)
WBC Count: 10.4 10*3/uL (ref 4.0–10.5)
nRBC: 0.2 % (ref 0.0–0.2)

## 2021-07-24 LAB — CMP (CANCER CENTER ONLY)
ALT: 22 U/L (ref 0–44)
AST: 18 U/L (ref 15–41)
Albumin: 4.2 g/dL (ref 3.5–5.0)
Alkaline Phosphatase: 54 U/L (ref 38–126)
Anion gap: 15 (ref 5–15)
BUN: 23 mg/dL — ABNORMAL HIGH (ref 6–20)
CO2: 24 mmol/L (ref 22–32)
Calcium: 9.7 mg/dL (ref 8.9–10.3)
Chloride: 102 mmol/L (ref 98–111)
Creatinine: 0.85 mg/dL (ref 0.44–1.00)
GFR, Estimated: >60 mL/min (ref >60)
Glucose, Bld: 173 mg/dL — ABNORMAL HIGH (ref 70–99)
Potassium: 3 mmol/L — ABNORMAL LOW (ref 3.5–5.1)
Sodium: 141 mmol/L (ref 135–145)
Total Bilirubin: 0.3 mg/dL (ref 0.3–1.2)
Total Protein: 7.5 g/dL (ref 6.5–8.1)

## 2021-07-24 MED ORDER — PALONOSETRON HCL INJECTION 0.25 MG/5ML
0.2500 mg | Freq: Once | INTRAVENOUS | Status: AC
  Administered 2021-07-24: 12:00:00 0.25 mg via INTRAVENOUS
  Filled 2021-07-24: qty 5

## 2021-07-24 MED ORDER — HEPARIN SOD (PORK) LOCK FLUSH 100 UNIT/ML IV SOLN
500.0000 [IU] | Freq: Once | INTRAVENOUS | Status: AC | PRN
  Administered 2021-07-24: 17:00:00 500 [IU]

## 2021-07-24 MED ORDER — SODIUM CHLORIDE 0.9 % IV SOLN
750.0000 mg | Freq: Once | INTRAVENOUS | Status: AC
  Administered 2021-07-24: 16:00:00 750 mg via INTRAVENOUS
  Filled 2021-07-24: qty 75

## 2021-07-24 MED ORDER — SODIUM CHLORIDE 0.9% FLUSH
10.0000 mL | INTRAVENOUS | Status: DC | PRN
  Administered 2021-07-24: 17:00:00 10 mL

## 2021-07-24 MED ORDER — TRASTUZUMAB-DKST CHEMO 150 MG IV SOLR
6.0000 mg/kg | Freq: Once | INTRAVENOUS | Status: AC
  Administered 2021-07-24: 13:00:00 609 mg via INTRAVENOUS
  Filled 2021-07-24: qty 29

## 2021-07-24 MED ORDER — ACETAMINOPHEN 325 MG PO TABS
650.0000 mg | ORAL_TABLET | Freq: Once | ORAL | Status: AC
  Administered 2021-07-24: 12:00:00 650 mg via ORAL
  Filled 2021-07-24: qty 2

## 2021-07-24 MED ORDER — SODIUM CHLORIDE 0.9 % IV SOLN
10.0000 mg | Freq: Once | INTRAVENOUS | Status: AC
  Administered 2021-07-24: 12:00:00 10 mg via INTRAVENOUS
  Filled 2021-07-24: qty 10

## 2021-07-24 MED ORDER — SODIUM CHLORIDE 0.9 % IV SOLN
150.0000 mg | Freq: Once | INTRAVENOUS | Status: AC
  Administered 2021-07-24: 12:00:00 150 mg via INTRAVENOUS
  Filled 2021-07-24: qty 150

## 2021-07-24 MED ORDER — SODIUM CHLORIDE 0.9 % IV SOLN: Freq: Once | INTRAVENOUS | Status: AC

## 2021-07-24 MED ORDER — SODIUM CHLORIDE 0.9 % IV SOLN
65.0000 mg/m2 | Freq: Once | INTRAVENOUS | Status: AC
  Administered 2021-07-24: 15:00:00 140 mg via INTRAVENOUS
  Filled 2021-07-24: qty 14

## 2021-07-24 MED ORDER — SODIUM CHLORIDE 0.9% FLUSH
10.0000 mL | Freq: Once | INTRAVENOUS | Status: AC | PRN
  Administered 2021-07-24: 11:00:00 10 mL

## 2021-07-24 MED ORDER — POTASSIUM CHLORIDE CRYS ER 20 MEQ PO TBCR
40.0000 meq | EXTENDED_RELEASE_TABLET | Freq: Once | ORAL | Status: AC
  Administered 2021-07-24: 14:00:00 40 meq via ORAL
  Filled 2021-07-24: qty 2

## 2021-07-24 MED ORDER — SODIUM CHLORIDE 0.9 % IV SOLN
420.0000 mg | Freq: Once | INTRAVENOUS | Status: AC
  Administered 2021-07-24: 14:00:00 420 mg via INTRAVENOUS
  Filled 2021-07-24: qty 14

## 2021-07-24 MED ORDER — DIPHENHYDRAMINE HCL 25 MG PO CAPS
50.0000 mg | ORAL_CAPSULE | Freq: Once | ORAL | Status: AC
  Administered 2021-07-24: 12:00:00 50 mg via ORAL
  Filled 2021-07-24: qty 2

## 2021-07-24 NOTE — Patient Instructions (Signed)
Joanna Osborn   Happy Last Treatment Day! Discharge Instructions: Thank you for choosing [facility] to provide your oncology and hematology care.   If you have a lab appointment with the [facility], please go directly to the [facility] and check in at the registration area.   Wear comfortable clothing and clothing appropriate for easy access to any Portacath or PICC line.   We strive to give you quality time with your provider. You may need to reschedule your appointment if you arrive late (15 or more minutes).  Arriving late affects you and other patients whose appointments are after yours.  Also, if you miss three or more appointments without notifying the office, you may be dismissed from the clinic at the providers discretion.      For prescription refill requests, have your pharmacy contact our office and allow 72 hours for refills to be completed.    Today you received the following chemotherapy and/or immunotherapy agents: Trastuzumab (Herceptin), Pertuzumab (Perjeta), Docetaxel (Taxotere), and Carboplatin      To help prevent nausea and vomiting after your treatment, we encourage you to take your nausea medication as directed.  BELOW ARE SYMPTOMS THAT SHOULD BE REPORTED IMMEDIATELY: *FEVER GREATER THAN 100.4 F (38 C) OR HIGHER *CHILLS OR SWEATING *NAUSEA AND VOMITING THAT IS NOT CONTROLLED WITH YOUR NAUSEA MEDICATION *UNUSUAL SHORTNESS OF BREATH *UNUSUAL BRUISING OR BLEEDING *URINARY PROBLEMS (pain or burning when urinating, or frequent urination) *BOWEL PROBLEMS (unusual diarrhea, constipation, pain near the anus) TENDERNESS IN MOUTH AND THROAT WITH OR WITHOUT PRESENCE OF ULCERS (sore throat, sores in mouth, or a toothache) UNUSUAL RASH, SWELLING OR PAIN  UNUSUAL VAGINAL DISCHARGE OR ITCHING   Items with * indicate a potential emergency and should be followed up as soon as possible or go to the Emergency Department if any  problems should occur.  Please show the CHEMOTHERAPY ALERT CARD or IMMUNOTHERAPY ALERT CARD at check-in to the Emergency Department and triage nurse.  Should you have questions after your visit or need to cancel or reschedule your appointment, please contact [facility]  Dept: [phone]  and follow the prompts.  Office hours are 8:00 a.m. to 4:30 p.m. Monday - Friday. Please note that voicemails left after 4:00 p.m. may not be returned until the following business day.  We are closed weekends and major holidays. You have access to a nurse at all times for urgent questions. Please call the main number to the clinic Dept: [phone] and follow the prompts.   For any non-urgent questions, you may also contact your provider using MyChart. We now offer e-Visits for anyone 76 and older to request care online for non-urgent symptoms. For details visit [email or web address].   Also download the MyChart app! Go to the app store, search "MyChart", open the app, select [facility], and log in with your MyChart username and password.  Due to Covid, a mask is required upon entering the hospital/clinic. If you do not have a mask, one will be given to you upon arrival. For doctor visits, patients may have 1 support person aged 87 or older with them. For treatment visits, patients cannot have anyone with them due to current Covid guidelines and our immunocompromised population.

## 2021-07-24 NOTE — Progress Notes (Signed)
Nutrition follow-up completed with patient during infusion for breast cancer.  Today is patient's final chemotherapy and she is very happy.  Patient will have immunotherapy and radiation treatments after final chemotherapy.  Weight was documented as 211 pounds on December 29.  Patient weighed 214 pounds December 5.  Labs include potassium 3.0, glucose 173, BUN 23.  Patient reports she continues to have nausea and sometimes vomiting.  She has been using Zofran and Compazine around-the-clock.  She receives Aloxi and Emend with her chemotherapy.  She is incorporating strategies for reducing nausea with food intake.  She continues to suck on sugar-free candy.  She uses ginger candy and ginger tea.  Nutrition diagnosis: Unintentional weight loss continues.  Intervention: Provided encouragement for patient to continue strategies for reducing nausea.   Encourage small frequent meals and snacks. Provided support and encouragement.  Monitoring, evaluation, goals: Patient will tolerate adequate calories and protein to minimize weight loss throughout treatment.  Next visit: Patient understands to contact RD with further questions or concerns.  Please refer back to RD as needed.  **Disclaimer: This note was dictated with voice recognition software. Similar sounding words can inadvertently be transcribed and this note may contain transcription errors which may not have been corrected upon publication of note.**

## 2021-07-24 NOTE — Progress Notes (Addendum)
[facility]   [phone] [phone]   Clinic Follow up Note   Patient Care Team: Lennie Odor, [place] as PCP - General (Physician Assistant) Rockwell Germany, RN as Oncology Nurse Navigator Mauro Kaufmann, RN as Oncology Nurse Navigator Rolm Bookbinder, MD as Consulting Physician (General Surgery) Truitt Merle, MD as Consulting Physician (Hematology) Kyung Rudd, MD as Consulting Physician (Radiation Oncology)  Date of Service:  07/24/2021  CHIEF COMPLAINT: f/u of left breast cancer  CURRENT THERAPY:  Neoadjuvant TCHP (docetaxel, carboplatin, herceptin, perjeta) with GCSF, starting 04/04/21.   ASSESSMENT & PLAN:  Joanna Osborn is a 50 y.o. female with   1. 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 Left mammogram on 03/05/21 showed 2.1 cm mass at 12 o'clock. Biopsy confirmed invasive ductal carcinoma, grade 3, weakly ER+ and Her2+ -Given her Her2+ disease, she began neoadjuvant TCHP q3 weeks for 6 cycles, followed by Herceptin w or wo Perjeta maintenance therapy for a total of one year.   -Breast MRI 03/26/21 showed additional areas of concern-- a left breast/axilla lymph node, and a right breast mass. She is scheduled for second-look ultrasounds and possible biopsies on 04/14/21. If she has positive node disease, we will refer her for staging work-up -echo from 04/02/21 reviewed, no concerns  -She began neoadjuvant [facility] 04/03/21 with G-CSF. She is also receiving IVF and antiemetics on day 3 and 6 of each cycle. Docetaxel dose reduced from C4 due to neuropathy -she is able to tolerate well with IVF. Labs reviewed, overall stable. Will monitor. Adequate to proceed with C6 today, her final cycle. -she is scheduled for post-treatment breast MRI tomorrow, 12/30, and to see Dr. Donne Hazel for follow up on 07/29/21 to finalize her surgical plan  -will continue  maintenance HP every 3 weeks. We discussed switching to [place] after surgery if she has significant residual disease on surgical path    2. Symptom Management: diarrhea, fatigue, nausea -she tolerated C1 TCHP relatively well, but experienced diarrhea and fatigue. -I prescribed lomotil on 04/10/21 for her to use. She notes less diarrhea with decreased dose s/p C4. -she has been experiencing near constant nausea following C3. She feels this is manageable now with interchanging Zofran and compazine. We will also continue to give IVF   3. Anxiety and depression, HTN -f/u PCP -continue HCTZ, will monitor her blood pressure closely during chemotherapy.  Advised her to hold if she is not drinking well or feels dizzy   4. Mild Anemia -her hgb has dropped recently secondary to chemo  -she still has periods, bleeding is light -She began oral iron -she received blood transfusion on 07/18/21.   5. Chemo induced peripheral neuropathy G1  -She noticed numbness on her toes cycle 3 chemo, exam today showed decreased vibration sensation mildly -I recommend her to start a B complex once daily -slightly decreased docetaxel chemo dose with C4     PLAN: -proceed with C6 TCHP today at same reduced dose             -G-CSF on day 3             -IVF on days 3 and 6 -breast MRI tomorrow, 12/30 -f/u with Dr. Donne Hazel on 07/29/21 -lab, flush, f/u and HP in 3 weeks    No problem-specific Assessment & Plan notes found for this encounter.   SUMMARY OF ONCOLOGIC HISTORY: Oncology History Overview  Note  Cancer Staging Malignant neoplasm of upper-outer quadrant of left breast in female, estrogen receptor positive ([place]) Staging form: Breast, AJCC 8th Edition - Clinical stage from 03/07/2021: Stage IB (cT2, cN0, cM0, G3, ER+, PR+, HER2+) - Signed by Truitt Merle, MD on 03/17/2021 Stage prefix: Initial diagnosis Histologic grading system: 3 grade system    Malignant neoplasm of upper-outer quadrant of left breast  in female, estrogen receptor positive ([place])  03/05/2021 Mammogram   Diagnostic Left Mammogram; Left Breast Ultrasound  IMPRESSION: At the palpable site of concern in the left breast at 12 o'clock there is a suspicious mass measuring 2.1 cm.   03/07/2021 Cancer Staging   Staging form: Breast, AJCC 8th Edition - Clinical stage from 03/07/2021: Stage IB (cT2, cN0, cM0, G3, ER+, PR+, HER2+) - Signed by Truitt Merle, MD on 03/17/2021 Stage prefix: Initial diagnosis Histologic grading system: 3 grade system    03/07/2021 Pathology Results   Diagnosis Breast, left, needle core biopsy, left breast 12 o' clock - INVASIVE DUCTAL CARCINOMA - SEE COMMENT Based on the biopsy, the carcinoma appears Nottingham grade 3 of 3  PROGNOSTIC INDICATORS Results: IMMUNOHISTOCHEMICAL AND MORPHOMETRIC ANALYSIS PERFORMED MANUALLY The tumor cells are POSITIVE for Her2 (3+). Estrogen Receptor: 30%, POSITIVE, WEAK STAINING INTENSITY Progesterone Receptor: 15%, POSITIVE, MODERATE STAINING INTENSITY Proliferation Marker Ki67: 40%     03/13/2021 Initial Diagnosis   Malignant neoplasm of upper-outer quadrant of left breast in female, estrogen receptor positive ([place])   03/26/2021 Imaging   MRI Breast  IMPRESSION: 1. 2.5 cm biopsy-proven malignancy within the UPPER LEFT breast. 2. Abnormal lymph node with focal cortical thickening in the posterior UPPER-OUTER LEFT breast/LOWER LEFT axilla. 2nd-look ultrasound with possible biopsy is recommended. 3. Indeterminate 0.8 cm OUTER RIGHT breast mass. 2nd-look ultrasound with possible biopsy is recommended.   03/29/2021 Genetic Testing   Negative hereditary cancer genetic testing: no pathogenic variants detected in [facility] CustomNext-Cancer +RNAinsight Panel.  The report date is March 29, 2021.   The CustomNext-Cancer+RNAinsight panel offered by [facility] includes sequencing and rearrangement analysis for the following 47 genes:  APC, ATM, AXIN2, BARD1, BMPR1A, BRCA1,  BRCA2, BRIP1, CDH1, CDK4, CDKN2A, CHEK2, DICER1, EPCAM, GREM1, HOXB13, MEN1, MLH1, MSH2, MSH3, MSH6, MUTYH, NBN, NF1, NF2, NTHL1, PALB2, PMS2, POLD1, POLE, PTEN, RAD51C, RAD51D, RECQL, RET, SDHA, SDHAF2, SDHB, SDHC, SDHD, SMAD4, SMARCA4, STK11, TP53, TSC1, TSC2, and VHL.  RNA data is routinely analyzed for use in variant interpretation for all genes.   04/04/2021 -  Chemotherapy   Patient is on Treatment Plan : BREAST  Docetaxel + Carboplatin + Trastuzumab + Pertuzumab  (TCHP) q21d      04/14/2021 Imaging   [place] Bilateral Breast  IMPRESSION: 1. No sonographic correlate for the 8 mm enhancing mass in the outer right breast. Recommendation is to prior seed with MRI guided biopsy. 2. An undulating, low lying lymph node along the 1 o'clock axis of the left breast demonstrates no areas of cortical thickening beyond 3 mm.      INTERVAL HISTORY:  VIVIANA TRIMBLE is here for a follow up of breast cancer. She was last seen by me on 06/30/21. She was seen in the infusion area. She reports she is excited to be done with chemo after today. She reports she has constant nausea; she is able to still eat well and manages with compazine and zofran interchangeably. She also notes some skin darkening to her hands.   All other systems were reviewed with the patient and are negative.  MEDICAL  HISTORY:  Past Medical History:  Diagnosis Date   Anxiety    Breast cancer ([place])    Depression    Family history of colon cancer 03/19/2021   History of kidney stones    Hypertension     SURGICAL HISTORY: Past Surgical History:  Procedure Laterality Date   CHOLECYSTECTOMY  2002   INSERTION OF MESH N/A 10/28/2016   Procedure: INSERTION OF MESH;  Surgeon: Clovis [name], MD;  Location: [facility];  Service: General;  Laterality: N/A;   PORTACATH PLACEMENT Right 04/02/2021   Procedure: INSERTION PORT-A-CATH;  Surgeon: Rolm Bookbinder, MD;  Location: [facility];  Service: General;  Laterality: Right;   [place] N/A 10/28/2016   Procedure: LAPAROSCOPIC VENTRAL HERNIA REPAIR;  Surgeon: Clovis [name], MD;  Location: [facility];  Service: General;  Laterality: N/A;   WRIST SURGERY      I have reviewed the social history and family history with the patient and they are unchanged from previous note.  ALLERGIES:  is allergic to emetrol and no known allergies.  MEDICATIONS:  Current Outpatient Medications  Medication Sig Dispense Refill   dexamethasone (DECADRON) 4 MG tablet Take 1 tablet (4 mg total) by mouth 2 (two) times daily. Start the day before Taxotere. Then take daily x 3 days after chemotherapy. 10 tablet 0   diphenoxylate-atropine (LOMOTIL) 2.5-0.025 MG tablet Take 2 tablets by mouth 4 (four) times daily as needed for diarrhea or loose stools. 90 tablet 1   hydrochlorothiazide (HYDRODIURIL) 25 MG tablet Take 25 mg by mouth daily.     lidocaine-prilocaine (EMLA) cream Apply to affected area once 30 g 3   LORazepam (ATIVAN) 1 MG tablet Take 1 tablet (1 mg total) by mouth every 8 (eight) hours as needed (nausea). 20 tablet 0   ondansetron (ZOFRAN) 8 MG tablet Take 1 tablet (8 mg total) by mouth 2 (two) times daily as needed (Nausea or vomiting). Start on the third day after chemotherapy. 30 tablet 1   potassium chloride SA (KLOR-CON) 20 MEQ tablet Take 1 tablet (20 mEq total) by mouth daily. 20 tablet 1   prochlorperazine (COMPAZINE) 10 MG tablet Take 1 tablet (10 mg total) by mouth every 6 (six) hours as needed (Nausea or vomiting). 30 tablet 1   No current facility-administered medications for this visit.   Facility-Administered Medications Ordered in Other Visits  Medication Dose Route Frequency Provider Last Rate Last Admin   sodium chloride flush (NS) 0.9 % injection 10 mL  10 mL Intracatheter PRN Truitt Merle, MD   10 mL at 07/24/21 1641    PHYSICAL EXAMINATION: ECOG PERFORMANCE STATUS: 1 - Symptomatic but completely ambulatory  There were no vitals  filed for this visit. Wt Readings from Last 3 Encounters:  07/24/21 211 lb 4 oz (95.8 kg)  07/02/21 214 lb 12 oz (97.4 kg)  06/30/21 214 lb (97.1 kg)     GENERAL:alert, no distress and comfortable SKIN: skin color normal, no rashes or significant lesions EYES: normal, Conjunctiva are pink and non-injected, sclera clear  NEURO: alert & oriented x 3 with fluent speech  LABORATORY DATA:  I have reviewed the data as listed CBC Latest Ref Rng & Units 07/24/2021 07/17/2021 06/30/2021  WBC 4.0 - 10.5 K/uL 10.4 3.9(L) 5.8  Hemoglobin 12.0 - 15.0 g/dL 9.8(L) 6.8(LL) 8.0(L)  Hematocrit 36.0 - 46.0 % 29.8(L) 20.9(L) 24.9(L)  Platelets 150 - 400 K/uL 359 68(L) 90(L)  CMP Latest Ref Rng & Units 07/24/2021 06/30/2021 06/16/2021  Glucose 70 - 99 mg/dL 173(H) 178(H) 116(H)  BUN 6 - 20 mg/dL 23(H) 15 19  Creatinine 0.44 - 1.00 mg/dL 0.85 0.82 0.82  Sodium 135 - 145 mmol/L 141 141 138  Potassium 3.5 - 5.1 mmol/L 3.0(L) 3.4(L) 4.3  Chloride 98 - 111 mmol/L 102 105 102  CO2 22 - 32 mmol/L 24 22 23   Calcium 8.9 - 10.3 mg/dL 9.7 9.7 9.8  Total Protein 6.5 - 8.1 g/dL 7.5 6.8 -  Total Bilirubin 0.3 - 1.2 mg/dL 0.3 0.4 -  Alkaline Phos 38 - 126 U/L 54 53 -  AST 15 - 41 U/L 18 20 -  ALT 0 - 44 U/L 22 36 -      RADIOGRAPHIC STUDIES: I have personally reviewed the radiological images as listed and agreed with the findings in the report. No results found.    No orders of the defined types were placed in this encounter.  All questions were answered. The patient knows to call the clinic with any problems, questions or concerns. No barriers to learning was detected. The total time spent in the appointment was 30 minutes.     Truitt Merle, MD 07/24/2021   I, Wilburn Mylar, am acting as scribe for Truitt Merle, MD.   I have reviewed the above documentation for accuracy and completeness, and I agree with the above.

## 2021-07-24 NOTE — Progress Notes (Signed)
Ok to treat with echo from 04/02/21 per Dr.Feng

## 2021-07-25 ENCOUNTER — Ambulatory Visit
Admission: RE | Admit: 2021-07-25 | Discharge: 2021-07-25 | Disposition: A | Discharge status: Home / Self Care | Payer: Federal, State, Local not specified - PPO | Source: Ambulatory Visit | Attending: Hematology | Admitting: Hematology

## 2021-07-25 DIAGNOSIS — N6314 Unspecified lump in the right breast, lower inner quadrant: Secondary | ICD-10-CM | POA: Diagnosis not present

## 2021-07-25 DIAGNOSIS — Z170 Estrogen receptor positive status [ER+]: Secondary | ICD-10-CM

## 2021-07-25 DIAGNOSIS — C50919 Malignant neoplasm of unspecified site of unspecified female breast: Secondary | ICD-10-CM | POA: Diagnosis not present

## 2021-07-25 DIAGNOSIS — N6313 Unspecified lump in the right breast, lower outer quadrant: Secondary | ICD-10-CM | POA: Diagnosis not present

## 2021-07-25 MED ORDER — GADOBUTROL 1 MMOL/ML IV SOLN
10.0000 mL | Freq: Once | INTRAVENOUS | Status: AC | PRN
  Administered 2021-07-25: 10 mL via INTRAVENOUS

## 2021-07-26 ENCOUNTER — Inpatient Hospital Stay: Payer: Federal, State, Local not specified - PPO

## 2021-07-26 ENCOUNTER — Other Ambulatory Visit: Payer: Self-pay

## 2021-07-26 VITALS — BP 133/95 | HR 85 | Temp 98.0°F | Resp 18

## 2021-07-26 DIAGNOSIS — R110 Nausea: Secondary | ICD-10-CM | POA: Diagnosis not present

## 2021-07-26 DIAGNOSIS — Z5111 Encounter for antineoplastic chemotherapy: Secondary | ICD-10-CM | POA: Diagnosis not present

## 2021-07-26 DIAGNOSIS — Z170 Estrogen receptor positive status [ER+]: Secondary | ICD-10-CM | POA: Diagnosis not present

## 2021-07-26 DIAGNOSIS — D6481 Anemia due to antineoplastic chemotherapy: Secondary | ICD-10-CM | POA: Diagnosis not present

## 2021-07-26 DIAGNOSIS — Z5112 Encounter for antineoplastic immunotherapy: Secondary | ICD-10-CM | POA: Diagnosis not present

## 2021-07-26 DIAGNOSIS — C50412 Malignant neoplasm of upper-outer quadrant of left female breast: Secondary | ICD-10-CM

## 2021-07-26 DIAGNOSIS — Z5189 Encounter for other specified aftercare: Secondary | ICD-10-CM | POA: Diagnosis not present

## 2021-07-26 DIAGNOSIS — Z95828 Presence of other vascular implants and grafts: Secondary | ICD-10-CM

## 2021-07-26 MED ORDER — HEPARIN SOD (PORK) LOCK FLUSH 100 UNIT/ML IV SOLN
500.0000 [IU] | Freq: Once | INTRAVENOUS | Status: AC
  Administered 2021-07-26: 500 [IU]

## 2021-07-26 MED ORDER — SODIUM CHLORIDE 0.9% FLUSH
10.0000 mL | Freq: Once | INTRAVENOUS | Status: AC
  Administered 2021-07-26: 10 mL

## 2021-07-26 MED ORDER — SODIUM CHLORIDE 0.9 % IV SOLN: Freq: Once | INTRAVENOUS | Status: AC

## 2021-07-26 MED ORDER — PEGFILGRASTIM-CBQV 6 MG/0.6ML ~~LOC~~ SOSY
6.0000 mg | PREFILLED_SYRINGE | Freq: Once | SUBCUTANEOUS | Status: AC
  Administered 2021-07-26: 6 mg via SUBCUTANEOUS
  Filled 2021-07-26: qty 0.6

## 2021-07-27 ENCOUNTER — Other Ambulatory Visit: Payer: Self-pay | Admitting: Hematology

## 2021-07-27 MED ORDER — POTASSIUM CHLORIDE CRYS ER 20 MEQ PO TBCR: 20.0000 meq | EXTENDED_RELEASE_TABLET | Freq: Every day | ORAL | 1 refills | Status: DC

## 2021-07-29 ENCOUNTER — Telehealth: Payer: Self-pay

## 2021-07-29 ENCOUNTER — Other Ambulatory Visit: Payer: Self-pay | Admitting: General Surgery

## 2021-07-29 ENCOUNTER — Other Ambulatory Visit: Payer: Self-pay

## 2021-07-29 ENCOUNTER — Inpatient Hospital Stay: Payer: Federal, State, Local not specified - PPO | Attending: Hematology

## 2021-07-29 VITALS — BP 120/77 | HR 113 | Temp 99.3°F | Resp 16 | Wt 208.5 lb

## 2021-07-29 DIAGNOSIS — N6314 Unspecified lump in the right breast, lower inner quadrant: Secondary | ICD-10-CM | POA: Diagnosis not present

## 2021-07-29 DIAGNOSIS — R110 Nausea: Secondary | ICD-10-CM

## 2021-07-29 DIAGNOSIS — Z170 Estrogen receptor positive status [ER+]: Secondary | ICD-10-CM

## 2021-07-29 DIAGNOSIS — Z5112 Encounter for antineoplastic immunotherapy: Secondary | ICD-10-CM | POA: Insufficient documentation

## 2021-07-29 DIAGNOSIS — C50412 Malignant neoplasm of upper-outer quadrant of left female breast: Secondary | ICD-10-CM | POA: Diagnosis not present

## 2021-07-29 DIAGNOSIS — Z95828 Presence of other vascular implants and grafts: Secondary | ICD-10-CM

## 2021-07-29 MED ORDER — ONDANSETRON HCL 4 MG PO TABS
8.0000 mg | ORAL_TABLET | Freq: Once | ORAL | Status: AC
  Administered 2021-07-29: 8 mg via ORAL
  Filled 2021-07-29: qty 1

## 2021-07-29 MED ORDER — HEPARIN SOD (PORK) LOCK FLUSH 100 UNIT/ML IV SOLN
500.0000 [IU] | Freq: Once | INTRAVENOUS | Status: AC | PRN
  Administered 2021-07-29: 500 [IU]

## 2021-07-29 MED ORDER — ONDANSETRON HCL 8 MG PO TABS: 8.0000 mg | ORAL_TABLET | Freq: Once | ORAL | Status: DC

## 2021-07-29 MED ORDER — SODIUM CHLORIDE 0.9% FLUSH
10.0000 mL | Freq: Once | INTRAVENOUS | Status: AC | PRN
  Administered 2021-07-29: 10 mL

## 2021-07-29 MED ORDER — SODIUM CHLORIDE 0.9 % IV SOLN: Freq: Once | INTRAVENOUS | Status: AC

## 2021-07-29 NOTE — Patient Instructions (Signed)
Rehydration, Adult °Rehydration is the replacement of body fluids, salts, and minerals (electrolytes) that are lost during dehydration. Dehydration is when there is not enough water or other fluids in the body. This happens when you lose more fluids than you take in. Common causes of dehydration include: °Not drinking enough fluids. This can occur when you are ill or doing activities that require a lot of energy, especially in hot weather. °Conditions that cause loss of water or other fluids, such as diarrhea, vomiting, sweating, or urinating a lot. °Other illnesses, such as fever or infection. °Certain medicines, such as those that remove excess fluid from the body (diuretics). °Symptoms of mild or moderate dehydration may include thirst, dry lips and mouth, and dizziness. Symptoms of severe dehydration may include increased heart rate, confusion, fainting, and not urinating. °For severe dehydration, you may need to get fluids through an IV at the hospital. For mild or moderate dehydration, you can usually rehydrate at home by drinking certain fluids as told by your health care provider. °What are the risks? °Generally, rehydration is safe. However, taking in too much fluid (overhydration) can be a problem. This is rare. Overhydration can cause an electrolyte imbalance, kidney failure, or a decrease in salt (sodium) levels in the body. °Supplies needed °You will need an oral rehydration solution (ORS) if your health care provider tells you to use one. This is a drink to treat dehydration. It can be found in pharmacies and retail stores. °How to rehydrate °Fluids °Follow instructions from your health care provider for rehydration. The kind of fluid and the amount you should drink depend on your condition. In general, you should choose drinks that you prefer. °If told by your health care provider, drink an ORS. °Make an ORS by following instructions on the package. °Start by drinking small amounts, about ½ cup (120  mL) every 5-10 minutes. °Slowly increase how much you drink until you have taken the amount recommended by your health care provider. °Drink enough clear fluids to keep your urine pale yellow. If you were told to drink an ORS, finish it first, then start slowly drinking other clear fluids. Drink fluids such as: °Water. This includes sparkling water and flavored water. Drinking only water can lead to having too little sodium in your body (hyponatremia). Follow the advice of your health care provider. °Water from ice chips you suck on. °Fruit juice with water you add to it (diluted). °Sports drinks. °Hot or cold herbal teas. °Broth-based soups. °Milk or milk products. °Food °Follow instructions from your health care provider about what to eat while you rehydrate. Your health care provider may recommend that you slowly begin eating regular foods in small amounts. °Eat foods that contain a healthy balance of electrolytes, such as bananas, oranges, potatoes, tomatoes, and spinach. °Avoid foods that are greasy or contain a lot of sugar. °In some cases, you may get nutrition through a feeding tube that is passed through your nose and into your stomach (nasogastric tube, or NG tube). This may be done if you have uncontrolled vomiting or diarrhea. °Beverages to avoid °Certain beverages may make dehydration worse. While you rehydrate, avoid drinking alcohol. °How to tell if you are recovering from dehydration °You may be recovering from dehydration if: °You are urinating more often than before you started rehydrating. °Your urine is pale yellow. °Your energy level improves. °You vomit less frequently. °You have diarrhea less frequently. °Your appetite improves or returns to normal. °You feel less dizzy or less light-headed. °Your   skin tone and color start to look more normal. Follow these instructions at home: Take over-the-counter and prescription medicines only as told by your health care provider. Do not take sodium  tablets. Doing this can lead to having too much sodium in your body (hypernatremia). Contact a health care provider if: You continue to have symptoms of mild or moderate dehydration, such as: Thirst. Dry lips. Slightly dry mouth. Dizziness. Dark urine or less urine than normal. Muscle cramps. You continue to vomit or have diarrhea. Get help right away if you: Have symptoms of dehydration that get worse. Have a fever. Have a severe headache. Have been vomiting and the following happens: Your vomiting gets worse or does not go away. Your vomit includes blood or green matter (bile). You cannot eat or drink without vomiting. Have problems with urination or bowel movements, such as: Diarrhea that gets worse or does not go away. Blood in your stool (feces). This may cause stool to look black and tarry. Not urinating, or urinating only a small amount of very dark urine, within 6-8 hours. Have trouble breathing. Have symptoms that get worse with treatment. These symptoms may represent a serious problem that is an emergency. Do not wait to see if the symptoms will go away. Get medical help right away. Call your local emergency services (911 in the [place] Do not drive yourself to the hospital. Summary Rehydration is the replacement of body fluids and minerals (electrolytes) that are lost during dehydration. Follow instructions from your health care provider for rehydration. The kind of fluid and amount you should drink depend on your condition. Slowly increase how much you drink until you have taken the amount recommended by your health care provider. Contact your health care provider if you continue to show signs of mild or moderate dehydration. This information is not intended to replace advice given to you by your health care provider. Make sure you discuss any questions you have with your health care provider.  Nausea and Vomiting, Adult Nausea is feeling that you have an upset stomach and  that you are about to vomit. Vomiting is when food in your stomach forcefully comes out of your mouth. Vomiting can make you feel weak. If you vomit, or if you are not able to drink enough fluids, you may not have enough water in your body (get dehydrated). If you do not have enough water in your body, you may: Feel tired. Feel thirsty. Have a dry mouth. Have cracked lips. Pee (urinate) less often. Older adults and people with other diseases or a weak body defense system (immune system) are at higher risk for not having enough water in the body. If you feel like you may vomit or you vomit, it is important to follow instructions from your doctor about how to take care of yourself. Follow these instructions at home: Watch your symptoms for any changes. Tell your doctor about them. Eating and drinking   Take an ORS (oral rehydration solution). This is a drink that is sold at pharmacies and stores. Drink clear fluids in small amounts as you are able, such as: Water. Ice chips. Fruit juice that has water added (diluted fruit juice). Low-calorie sports drinks. Eat bland, easy-to-digest foods in small amounts as you are able, such as: Bananas. Applesauce. Rice. Low-fat (lean) meats. Toast. Crackers. Avoid drinking fluids that have a lot of sugar or caffeine in them. This includes energy drinks, sports drinks, and soda. Avoid alcohol. Avoid spicy or fatty foods. General instructions  Take over-the-counter and prescription medicines only as told by your doctor. Drink enough fluid to keep your pee (urine) pale yellow. Wash your hands often with soap and water for at least 20 seconds. If you cannot use soap and water, use hand sanitizer. Make sure that everyone in your home washes their hands well and often. Rest at home until you feel better. Watch your condition for any changes. Take slow and deep breaths when you feel like you may vomit. Keep all follow-up visits. Contact a doctor  if: Your symptoms get worse. You have new symptoms. You have a fever. You cannot drink fluids without vomiting. You feel like you may vomit for more than 2 days. You feel light-headed or dizzy. You have a headache. You have muscle cramps. You have a rash. You have pain while peeing. Get help right away if: You have pain in your chest, neck, arm, or jaw. You feel very weak or you faint. You vomit again and again. You have vomit that is bright red or looks like black coffee grounds. You have bloody or black poop (stools) or poop that looks like tar. You have a very bad headache, a stiff neck, or both. You have very bad pain, cramping, or bloating in your belly (abdomen). You have trouble breathing. You are breathing very quickly. Your heart is beating very quickly. Your skin feels cold and clammy. You feel confused. You have signs of losing too much water in your body, such as: Dark pee, very little pee, or no pee. Cracked lips. Dry mouth. Sunken eyes. Sleepiness. Weakness. These symptoms may be an emergency. Get help right away. Call 911. Do not wait to see if the symptoms will go away. Do not drive yourself to the hospital. Summary Nausea is feeling that you have an upset stomach and that you are about to vomit. Vomiting is when food in your stomach comes out of your mouth. Follow instructions from your doctor about eating and drinking. Take over-the-counter and prescription medicines only as told by your doctor. Contact your doctor if your symptoms get worse or you have new symptoms. Keep all follow-up visits. This information is not intended to replace advice given to you by your health care provider. Make sure you discuss any questions you have with your health care provider. Document Revised: 01/17/2021 Document Reviewed: 01/17/2021 Elsevier Patient Education  [place] Revised: 09/13/2019 Document Reviewed: 07/24/2019 Elsevier Patient Education   2022 [employer].

## 2021-07-29 NOTE — Progress Notes (Signed)
Pt. complained of nausea, no emesis noted, but states she had some emesis this am. Dr. Burr Medico notified and pt. received Zofran 8 mg po.

## 2021-07-29 NOTE — Telephone Encounter (Signed)
-----   Message from Truitt Merle, MD sent at 07/27/2021 12:39 PM EST ----- I will send her a Mychart message about her low K, and refilled her K, please make sure she knows it. Thanks   YF

## 2021-07-29 NOTE — Telephone Encounter (Signed)
Called Patient regarding test results and refill of Potassium prescription as requested by Provider. No other concerns verbalized at this time.

## 2021-08-01 ENCOUNTER — Other Ambulatory Visit: Payer: Self-pay | Admitting: General Surgery

## 2021-08-01 ENCOUNTER — Telehealth: Payer: Self-pay | Admitting: Hematology

## 2021-08-01 DIAGNOSIS — C50412 Malignant neoplasm of upper-outer quadrant of left female breast: Secondary | ICD-10-CM

## 2021-08-01 DIAGNOSIS — Z170 Estrogen receptor positive status [ER+]: Secondary | ICD-10-CM

## 2021-08-01 NOTE — Telephone Encounter (Signed)
Scheduled per 01/05 scheduled message, patient has been called and notified. °

## 2021-08-04 ENCOUNTER — Other Ambulatory Visit: Payer: Self-pay | Admitting: General Surgery

## 2021-08-04 ENCOUNTER — Other Ambulatory Visit: Payer: Self-pay

## 2021-08-04 ENCOUNTER — Encounter: Payer: Self-pay | Admitting: Emergency Medicine

## 2021-08-04 ENCOUNTER — Ambulatory Visit (INDEPENDENT_AMBULATORY_CARE_PROVIDER_SITE_OTHER): Payer: Federal, State, Local not specified - PPO

## 2021-08-04 ENCOUNTER — Ambulatory Visit
Admission: EM | Admit: 2021-08-04 | Discharge: 2021-08-04 | Disposition: A | Discharge status: Home / Self Care | Payer: Federal, State, Local not specified - PPO | Attending: Internal Medicine | Admitting: Internal Medicine

## 2021-08-04 DIAGNOSIS — R0989 Other specified symptoms and signs involving the circulatory and respiratory systems: Secondary | ICD-10-CM

## 2021-08-04 DIAGNOSIS — R059 Cough, unspecified: Secondary | ICD-10-CM | POA: Diagnosis not present

## 2021-08-04 DIAGNOSIS — N6314 Unspecified lump in the right breast, lower inner quadrant: Secondary | ICD-10-CM

## 2021-08-04 DIAGNOSIS — R051 Acute cough: Secondary | ICD-10-CM

## 2021-08-04 MED ORDER — AMOXICILLIN-POT CLAVULANATE 875-125 MG PO TABS: 1.0000 | ORAL_TABLET | Freq: Two times a day (BID) | ORAL | 0 refills | Status: DC

## 2021-08-04 NOTE — Discharge Instructions (Signed)
You have been prescribed an antibiotic. Please follow up very closely with oncologist for any persistent symptoms.

## 2021-08-04 NOTE — ED Provider Notes (Signed)
[facility]    CSN: [id number] Arrival date & time: 08/04/21  0802      History   Chief Complaint Chief Complaint  Patient presents with   Cough    HPI Joanna Osborn is a 51 y.o. female.   Patient presents with runny nose and cough.  She reports that the runny nose has been present for approximately 1 month and the cough started approximately 1 week ago.  Cough is productive with yellow sputum.  Patient denies any known fevers or sick contacts.  Denies sore throat, ear pain chest pain, shortness of breath, diarrhea, abdominal pain.  She does report nausea from chemo but no changes to her nausea recently.  Patient does not report taking any medications to alleviate her symptoms just yet.   Cough  Past Medical History:  Diagnosis Date   Anxiety    Breast cancer ([place])    Depression    Family history of colon cancer 03/19/2021   History of kidney stones    Hypertension     Patient Active Problem List   Diagnosis Date Noted   Genetic testing 04/08/2021   Port-A-Cath in place 04/03/2021   Family history of colon cancer 03/19/2021   Malignant neoplasm of upper-outer quadrant of left breast in female, estrogen receptor positive ([place]) 03/13/2021   Suicide ideation 07/07/2013   Major depressive disorder, recurrent severe without psychotic features ([place]) 07/06/2013    Past Surgical History:  Procedure Laterality Date   CHOLECYSTECTOMY  2002   INSERTION OF MESH N/A 10/28/2016   Procedure: INSERTION OF MESH;  Surgeon: Clovis [name], MD;  Location: [facility];  Service: General;  Laterality: N/A;   PORTACATH PLACEMENT Right 04/02/2021   Procedure: INSERTION PORT-A-CATH;  Surgeon: Rolm Bookbinder, MD;  Location: [facility];  Service: General;  Laterality: Right;   [place] N/A 10/28/2016   Procedure: LAPAROSCOPIC VENTRAL HERNIA REPAIR;  Surgeon: Clovis [name], MD;  Location: [facility];  Service: General;  Laterality: N/A;    WRIST SURGERY      OB History     Gravida  2   Para  2   Term  2   Preterm      AB      Living  2      SAB      IAB      Ectopic      Multiple      Live Births               Home Medications    Prior to Admission medications   Medication Sig Start Date End Date Taking? Authorizing Provider  amoxicillin-clavulanate (AUGMENTIN) 875-125 MG tablet Take 1 tablet by mouth every 12 (twelve) hours. 08/04/21  Yes Idil Maslanka, Hildred Alamin E, FNP  dexamethasone (DECADRON) 4 MG tablet Take 1 tablet (4 mg total) by mouth 2 (two) times daily. Start the day before Taxotere. Then take daily x 3 days after chemotherapy. 06/30/21   Truitt Merle, MD  diphenoxylate-atropine (LOMOTIL) 2.5-0.025 MG tablet Take 2 tablets by mouth 4 (four) times daily as needed for diarrhea or loose stools. 05/19/21   Truitt Merle, MD  hydrochlorothiazide (HYDRODIURIL) 25 MG tablet Take 25 mg by mouth daily. 02/26/21   [provider]  lidocaine-prilocaine (EMLA) cream Apply to affected area once 03/19/21   Truitt Merle, MD  LORazepam (ATIVAN) 1 MG tablet Take 1 tablet (1 mg total) by mouth every 8 (eight) hours  as needed (nausea). 06/16/21   Truitt Merle, MD  ondansetron (ZOFRAN) 8 MG tablet Take 1 tablet (8 mg total) by mouth 2 (two) times daily as needed (Nausea or vomiting). Start on the third day after chemotherapy. 03/19/21   Truitt Merle, MD  potassium chloride SA (KLOR-CON M) 20 MEQ tablet Take 1 tablet (20 mEq total) by mouth daily. 07/27/21   Truitt Merle, MD  prochlorperazine (COMPAZINE) 10 MG tablet Take 1 tablet (10 mg total) by mouth every 6 (six) hours as needed (Nausea or vomiting). 03/19/21   Truitt Merle, MD    Family History Family History  Problem Relation Age of Onset   Kidney disease Mother    Cancer Paternal Aunt        unknown type; dx after 96   Colon cancer Paternal Grandmother        dx unknown age   Colon cancer Paternal Grandfather        dx after 14   Hypertension Other    Cancer Other    Cancer  Cousin        unknown type; dx before 13    Social History Social History   Tobacco Use   Smoking status: Never   Smokeless tobacco: Never  Vaping Use   Vaping Use: Never used  Substance Use Topics   Alcohol use: Yes    Alcohol/week: 1.0 standard drink    Types: 1 Glasses of wine per week    Comment: one glass wine twice monthly   Drug use: No    Comment: never used drugs     Allergies   Emetrol and No known allergies   Review of Systems Review of Systems Per HPI  Physical Exam Triage Vital Signs ED Triage Vitals  Enc Vitals Group     BP 08/04/21 0813 139/90     Pulse Rate 08/04/21 0813 (!) 117     Resp 08/04/21 0813 16     Temp 08/04/21 0813 98.8 F (37.1 C)     Temp Source 08/04/21 0813 Oral     SpO2 08/04/21 0813 96 %     Weight --      Height --      Head Circumference --      Peak Flow --      Pain Score 08/04/21 0814 2     Pain Loc --      Pain Edu? --      Excl. in [place]? --    No data found.  Updated Vital Signs BP 139/90 (BP Location: Left Arm)    Pulse (!) 117    Temp 98.8 F (37.1 C) (Oral)    Resp 16    SpO2 96%   Visual Acuity Right Eye Distance:   Left Eye Distance:   Bilateral Distance:    Right Eye Near:   Left Eye Near:    Bilateral Near:     Physical Exam Constitutional:      General: She is not in acute distress.    Appearance: Normal appearance. She is not toxic-appearing or diaphoretic.  HENT:     Head: Normocephalic and atraumatic.     Right Ear: Tympanic membrane and ear canal normal.     Left Ear: Tympanic membrane and ear canal normal.     Nose: Congestion present.     Mouth/Throat:     Mouth: Mucous membranes are moist.     Pharynx: No posterior oropharyngeal erythema.  Eyes:     Extraocular Movements: Extraocular  movements intact.     Conjunctiva/sclera: Conjunctivae normal.     Pupils: Pupils are equal, round, and reactive to light.  Cardiovascular:     Rate and Rhythm: Normal rate and regular rhythm.      Pulses: Normal pulses.     Heart sounds: Normal heart sounds.  Pulmonary:     Effort: Pulmonary effort is normal. No respiratory distress.     Breath sounds: Normal breath sounds. No stridor. No wheezing, rhonchi or rales.  Abdominal:     General: Abdomen is flat. Bowel sounds are normal.     Palpations: Abdomen is soft.  Musculoskeletal:        General: Normal range of motion.     Cervical back: Normal range of motion.  Skin:    General: Skin is warm and dry.  Neurological:     General: No focal deficit present.     Mental Status: She is alert and oriented to person, place, and time. Mental status is at baseline.  Psychiatric:        Mood and Affect: Mood normal.        Behavior: Behavior normal.     UC Treatments / Results  Labs (all labs ordered are listed, but only abnormal results are displayed) Labs Reviewed  COVID-19, FLU A+B NAA    EKG   Radiology DG Chest 2 View  Result Date: 08/04/2021 CLINICAL DATA:  Cough, cancer patient on chemotherapy. EXAM: CHEST - 2 VIEW COMPARISON:  None. FINDINGS: The heart size and mediastinal contours are within normal limits. Both lungs are clear. The visualized skeletal structures are unremarkable. Right IJ access MediPort with distal tip in the SVC. IMPRESSION: No active cardiopulmonary disease. Electronically Signed   By: Keane Police D.O.   On: 08/04/2021 08:41    Procedures Procedures (including critical care time)  Medications Ordered in UC Medications - No data to display  Initial Impression / Assessment and Plan / UC Course  I have reviewed the triage vital signs and the nursing notes.  Pertinent labs & imaging results that were available during my care of the patient were reviewed by me and considered in my medical decision making (see chart for details).     Patient's symptoms could be viral in etiology especially her recent cough.  X-ray was negative for any acute cardiopulmonary process.  Will opt to treat with  Augmentin antibiotic given duration of symptoms and patient's current immunocompromised status given that she has breast cancer and is currently receiving chemotherapy.  Patient advised to follow-up with oncologist or PCP very closely if symptoms persist or worsen.  There is no fever present currently and patient does not have any fever, but patient was advised to go to the hospital if any fever starts.  Discussed strict return precautions.  Patient verbalized understanding was agreeable with plan. Final Clinical Impressions(s) / UC Diagnoses   Final diagnoses:  Acute cough  Runny nose     Discharge Instructions      You have been prescribed an antibiotic. Please follow up very closely with oncologist for any persistent symptoms.      ED Prescriptions     Medication Sig Dispense Auth. Provider   amoxicillin-clavulanate (AUGMENTIN) 875-125 MG tablet Take 1 tablet by mouth every 12 (twelve) hours. 14 tablet [place], Michele Rockers, [place]      PDMP not reviewed this encounter.   Teodora Medici, [place] 08/04/21 [phone]

## 2021-08-04 NOTE — ED Triage Notes (Signed)
C/o nose running x 1 month. Cough x 1 week. Pt is a cancer patient currently on chemo, nausea from chemo makes coughing worse. Reports coughing up yellow mucus

## 2021-08-05 ENCOUNTER — Telehealth: Payer: Self-pay

## 2021-08-05 LAB — COVID-19, FLU A+B NAA
Influenza A, NAA: NOT DETECTED
Influenza B, NAA: NOT DETECTED
SARS-CoV-2, NAA: NOT DETECTED

## 2021-08-05 NOTE — Telephone Encounter (Signed)
Notified Patient of completion of disability form for [employer]. Copy of form and request for medical records forwarded to [facility] with signed release of information forms. No other needs verbalized at this time

## 2021-08-07 ENCOUNTER — Encounter: Payer: Self-pay | Admitting: *Deleted

## 2021-08-07 ENCOUNTER — Ambulatory Visit
Admission: RE | Admit: 2021-08-07 | Discharge: 2021-08-07 | Disposition: A | Discharge status: Home / Self Care | Payer: Federal, State, Local not specified - PPO | Source: Ambulatory Visit | Attending: General Surgery | Admitting: General Surgery

## 2021-08-07 ENCOUNTER — Other Ambulatory Visit: Payer: Self-pay | Admitting: General Surgery

## 2021-08-07 DIAGNOSIS — C50412 Malignant neoplasm of upper-outer quadrant of left female breast: Secondary | ICD-10-CM

## 2021-08-07 DIAGNOSIS — D241 Benign neoplasm of right breast: Secondary | ICD-10-CM | POA: Diagnosis not present

## 2021-08-07 DIAGNOSIS — N6314 Unspecified lump in the right breast, lower inner quadrant: Secondary | ICD-10-CM

## 2021-08-07 DIAGNOSIS — N6313 Unspecified lump in the right breast, lower outer quadrant: Secondary | ICD-10-CM | POA: Diagnosis not present

## 2021-08-07 DIAGNOSIS — Z170 Estrogen receptor positive status [ER+]: Secondary | ICD-10-CM

## 2021-08-12 ENCOUNTER — Other Ambulatory Visit: Payer: Federal, State, Local not specified - PPO

## 2021-08-13 ENCOUNTER — Encounter (HOSPITAL_BASED_OUTPATIENT_CLINIC_OR_DEPARTMENT_OTHER): Payer: Self-pay | Admitting: General Surgery

## 2021-08-13 ENCOUNTER — Other Ambulatory Visit: Payer: Self-pay

## 2021-08-14 ENCOUNTER — Inpatient Hospital Stay: Payer: Federal, State, Local not specified - PPO

## 2021-08-14 ENCOUNTER — Other Ambulatory Visit: Payer: Self-pay

## 2021-08-14 ENCOUNTER — Other Ambulatory Visit: Payer: Federal, State, Local not specified - PPO

## 2021-08-14 ENCOUNTER — Inpatient Hospital Stay (HOSPITAL_BASED_OUTPATIENT_CLINIC_OR_DEPARTMENT_OTHER): Payer: Federal, State, Local not specified - PPO | Admitting: Hematology

## 2021-08-14 VITALS — BP 112/83 | HR 106 | Temp 99.1°F | Resp 17 | Ht 65.0 in | Wt 210.7 lb

## 2021-08-14 VITALS — BP 118/81 | HR 100 | Temp 99.0°F | Resp 18

## 2021-08-14 DIAGNOSIS — Z170 Estrogen receptor positive status [ER+]: Secondary | ICD-10-CM

## 2021-08-14 DIAGNOSIS — C50412 Malignant neoplasm of upper-outer quadrant of left female breast: Secondary | ICD-10-CM | POA: Diagnosis not present

## 2021-08-14 DIAGNOSIS — Z5112 Encounter for antineoplastic immunotherapy: Secondary | ICD-10-CM | POA: Diagnosis not present

## 2021-08-14 DIAGNOSIS — R110 Nausea: Secondary | ICD-10-CM | POA: Diagnosis not present

## 2021-08-14 DIAGNOSIS — Z95828 Presence of other vascular implants and grafts: Secondary | ICD-10-CM

## 2021-08-14 LAB — CMP (CANCER CENTER ONLY)
ALT: 38 U/L (ref 0–44)
AST: 28 U/L (ref 15–41)
Albumin: 4.2 g/dL (ref 3.5–5.0)
Alkaline Phosphatase: 50 U/L (ref 38–126)
Anion gap: 8 (ref 5–15)
BUN: 20 mg/dL (ref 6–20)
CO2: 28 mmol/L (ref 22–32)
Calcium: 9.8 mg/dL (ref 8.9–10.3)
Chloride: 104 mmol/L (ref 98–111)
Creatinine: 0.71 mg/dL (ref 0.44–1.00)
GFR, Estimated: >60 mL/min (ref >60)
Glucose, Bld: 142 mg/dL — ABNORMAL HIGH (ref 70–99)
Potassium: 3.4 mmol/L — ABNORMAL LOW (ref 3.5–5.1)
Sodium: 140 mmol/L (ref 135–145)
Total Bilirubin: 0.4 mg/dL (ref 0.3–1.2)
Total Protein: 7.2 g/dL (ref 6.5–8.1)

## 2021-08-14 LAB — CBC WITH DIFFERENTIAL (CANCER CENTER ONLY)
Abs Immature Granulocytes: 0.01 10*3/uL (ref 0.00–0.07)
Basophils Absolute: 0 10*3/uL (ref 0.0–0.1)
Basophils Relative: 0 %
Eosinophils Absolute: 0 10*3/uL (ref 0.0–0.5)
Eosinophils Relative: 1 %
HCT: 27.4 % — ABNORMAL LOW (ref 36.0–46.0)
Hemoglobin: 8.9 g/dL — ABNORMAL LOW (ref 12.0–15.0)
Immature Granulocytes: 0 %
Lymphocytes Relative: 42 %
Lymphs Abs: 2.1 10*3/uL (ref 0.7–4.0)
MCH: 32.1 pg (ref 26.0–34.0)
MCHC: 32.5 g/dL (ref 30.0–36.0)
MCV: 98.9 fL (ref 80.0–100.0)
Monocytes Absolute: 0.4 10*3/uL (ref 0.1–1.0)
Monocytes Relative: 9 %
Neutro Abs: 2.4 10*3/uL (ref 1.7–7.7)
Neutrophils Relative %: 48 %
Platelet Count: 161 10*3/uL (ref 150–400)
RBC: 2.77 MIL/uL — ABNORMAL LOW (ref 3.87–5.11)
RDW: 19.1 % — ABNORMAL HIGH (ref 11.5–15.5)
WBC Count: 5 10*3/uL (ref 4.0–10.5)
nRBC: 0 % (ref 0.0–0.2)

## 2021-08-14 MED ORDER — SODIUM CHLORIDE 0.9 % IV SOLN
420.0000 mg | Freq: Once | INTRAVENOUS | Status: AC
  Administered 2021-08-14: 420 mg via INTRAVENOUS
  Filled 2021-08-14: qty 14

## 2021-08-14 MED ORDER — SODIUM CHLORIDE 0.9% FLUSH
10.0000 mL | INTRAVENOUS | Status: DC | PRN
  Administered 2021-08-14: 10 mL

## 2021-08-14 MED ORDER — TRASTUZUMAB-DKST CHEMO 150 MG IV SOLR
6.0000 mg/kg | Freq: Once | INTRAVENOUS | Status: AC
  Administered 2021-08-14: 567 mg via INTRAVENOUS
  Filled 2021-08-14: qty 27

## 2021-08-14 MED ORDER — SODIUM CHLORIDE 0.9 % IV SOLN: Freq: Once | INTRAVENOUS | Status: AC

## 2021-08-14 MED ORDER — ACETAMINOPHEN 325 MG PO TABS
650.0000 mg | ORAL_TABLET | Freq: Once | ORAL | Status: AC
  Administered 2021-08-14: 650 mg via ORAL
  Filled 2021-08-14: qty 2

## 2021-08-14 MED ORDER — SODIUM CHLORIDE 0.9% FLUSH
10.0000 mL | Freq: Once | INTRAVENOUS | Status: AC
  Administered 2021-08-14: 10 mL

## 2021-08-14 MED ORDER — DIPHENHYDRAMINE HCL 25 MG PO CAPS
50.0000 mg | ORAL_CAPSULE | Freq: Once | ORAL | Status: AC
  Administered 2021-08-14: 50 mg via ORAL
  Filled 2021-08-14: qty 2

## 2021-08-14 MED ORDER — HEPARIN SOD (PORK) LOCK FLUSH 100 UNIT/ML IV SOLN
500.0000 [IU] | Freq: Once | INTRAVENOUS | Status: AC | PRN
  Administered 2021-08-14: 500 [IU]

## 2021-08-14 NOTE — Progress Notes (Signed)
Verbal order given for ECHO by Dr. Burr Medico.  Scheduled ECHO for 09/03/2021 @9am  at [facility].

## 2021-08-14 NOTE — Progress Notes (Signed)
Per Dr. Burr Medico, "OK To Treat w/ECHO dated 04/02/2021"

## 2021-08-14 NOTE — Patient Instructions (Signed)
[facility]  Discharge Instructions: Thank you for choosing [facility] to provide your oncology and hematology care.   If you have a lab appointment with the [facility], please go directly to the [facility] and check in at the registration area.   Wear comfortable clothing and clothing appropriate for easy access to any Portacath or PICC line.   We strive to give you quality time with your provider. You may need to reschedule your appointment if you arrive late (15 or more minutes).  Arriving late affects you and other patients whose appointments are after yours.  Also, if you miss three or more appointments without notifying the office, you may be dismissed from the clinic at the providers discretion.      For prescription refill requests, have your pharmacy contact our office and allow 72 hours for refills to be completed.    Today you received the following chemotherapy and/or immunotherapy agents: Trastuzumab and Pertuzumab (Perjeta).   To help prevent nausea and vomiting after your treatment, we encourage you to take your nausea medication as directed.  BELOW ARE SYMPTOMS THAT SHOULD BE REPORTED IMMEDIATELY: *FEVER GREATER THAN 100.4 F (38 C) OR HIGHER *CHILLS OR SWEATING *NAUSEA AND VOMITING THAT IS NOT CONTROLLED WITH YOUR NAUSEA MEDICATION *UNUSUAL SHORTNESS OF BREATH *UNUSUAL BRUISING OR BLEEDING *URINARY PROBLEMS (pain or burning when urinating, or frequent urination) *BOWEL PROBLEMS (unusual diarrhea, constipation, pain near the anus) TENDERNESS IN MOUTH AND THROAT WITH OR WITHOUT PRESENCE OF ULCERS (sore throat, sores in mouth, or a toothache) UNUSUAL RASH, SWELLING OR PAIN  UNUSUAL VAGINAL DISCHARGE OR ITCHING   Items with * indicate a potential emergency and should be followed up as soon as possible or go to the Emergency Department if any problems should occur.  Please show the CHEMOTHERAPY ALERT CARD or IMMUNOTHERAPY  ALERT CARD at check-in to the Emergency Department and triage nurse.  Should you have questions after your visit or need to cancel or reschedule your appointment, please contact [facility]  Dept: [phone]  and follow the prompts.  Office hours are 8:00 a.m. to 4:30 p.m. Monday - Friday. Please note that voicemails left after 4:00 p.m. may not be returned until the following business day.  We are closed weekends and major holidays. You have access to a nurse at all times for urgent questions. Please call the main number to the clinic Dept: [phone] and follow the prompts.   For any non-urgent questions, you may also contact your provider using MyChart. We now offer e-Visits for anyone 2 and older to request care online for non-urgent symptoms. For details visit [email or web address].   Also download the MyChart app! Go to the app store, search "MyChart", open the app, select [facility], and log in with your MyChart username and password.  Due to Covid, a mask is required upon entering the hospital/clinic. If you do not have a mask, one will be given to you upon arrival. For doctor visits, patients may have 1 support person aged 50 or older with them. For treatment visits, patients cannot have anyone with them due to current Covid guidelines and our immunocompromised population.

## 2021-08-14 NOTE — Progress Notes (Signed)
[facility]   Telephone:([phone]) [phone] [phone]   Clinic Follow up Note   Patient Care Team: Lennie Odor, [place] as PCP - General (Physician Assistant) Rockwell Germany, RN as Oncology Nurse Navigator Mauro Kaufmann, RN as Oncology Nurse Navigator Rolm Bookbinder, MD as Consulting Physician (General Surgery) Truitt Merle, MD as Consulting Physician (Hematology) Kyung Rudd, MD as Consulting Physician (Radiation Oncology)  Date of Service:  08/14/2021  CHIEF COMPLAINT: f/u of left breast cancer  CURRENT THERAPY:  Maintenance HP (herceptin, perjeta) with GCSF, starting 08/14/21  ASSESSMENT & PLAN:  Joanna Osborn is a 51 y.o. female with   1. 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 Left mammogram on 03/05/21 showed 2.1 cm mass at 12 o'clock. Biopsy confirmed invasive ductal carcinoma, grade 3, weakly ER+ and Her2+ -Given her Her2+ disease, she began neoadjuvant TCHP q3 weeks for 6 cycles, to be followed by Herceptin w or wo Perjeta maintenance therapy for a total of one year.   -Breast MRI 03/26/21 showed additional areas of concern-- a left breast/axilla lymph node, and a right breast mass. Right breast biopsy 04/23/21 showed complex sclerosing lesion. -echo from 04/02/21 reviewed, no concerns  -She received 6 cycles of neoadjuvant TCHP with G-CSF 04/03/21-07/24/21. Docetaxel dose reduced from C4 due to neuropathy. -post-treatment breast MRI on 07/25/21 showed new 8 mm right breast mass with overall benign features; complete imaging response in left breast; no metastatic lymphadenopathy. She underwent second-look [place] and biopsy on 08/07/21, pathology was benign. -she will move to maintenance HP today. Depending on her final pathology, we may switch to Kadcyla if she has residual disease.  -she is scheduled for lumpectomy on 08/25/21 with Dr. Donne Hazel. -Labs  reviewed, overall stable.    2. Symptom Management: diarrhea, fatigue, nausea -controlled with IVF and antiemetics on days 3 and 6, lomotil, and zofran and compazine.   3. Anxiety and depression, HTN -f/u PCP -continue HCTZ, will monitor her blood pressure closely during chemotherapy.  Advised her to hold if she is not drinking well or feels dizzy   4. Mild Anemia -her hgb has dropped recently secondary to chemo  -she still has periods, bleeding is light -She began oral iron -she received blood transfusion on 07/18/21.   5. Chemo induced peripheral neuropathy G1  -She noticed numbness on her toes cycle 3 chemo, exam today showed decreased vibration sensation mildly -I recommend her to start a B complex once daily -slightly decreased docetaxel chemo dose with C4     PLAN: -proceed with HP today -f/u with Dr. Donne Hazel on 08/25/21 -lab, flush, f/u and HP in 3 weeks, will review her surgical path on next visit to see if we need to change her treatment to Kadcyla    No problem-specific Assessment & Plan notes found for this encounter.   SUMMARY OF ONCOLOGIC HISTORY: Oncology History Overview Note  Cancer Staging Malignant neoplasm of upper-outer quadrant of left breast in female, estrogen receptor positive ([place]) Staging form: Breast, AJCC 8th Edition - Clinical stage from 03/07/2021: Stage IB (cT2, cN0, cM0, G3, ER+, PR+, HER2+) - Signed by Truitt Merle, MD on 03/17/2021 Stage prefix: Initial diagnosis Histologic grading system: 3 grade system    Malignant neoplasm of upper-outer quadrant of left breast in female, estrogen receptor positive ([place])  03/05/2021 Mammogram   Diagnostic Left Mammogram; Left Breast Ultrasound  IMPRESSION: At the palpable site of concern  in the left breast at 12 o'clock there is a suspicious mass measuring 2.1 cm.   03/07/2021 Cancer Staging   Staging form: Breast, AJCC 8th Edition - Clinical stage from 03/07/2021: Stage IB (cT2, cN0, cM0, G3, ER+, PR+,  HER2+) - Signed by Truitt Merle, MD on 03/17/2021 Stage prefix: Initial diagnosis Histologic grading system: 3 grade system    03/07/2021 Pathology Results   Diagnosis Breast, left, needle core biopsy, left breast 12 o' clock - INVASIVE DUCTAL CARCINOMA - SEE COMMENT Based on the biopsy, the carcinoma appears Nottingham grade 3 of 3  PROGNOSTIC INDICATORS Results: IMMUNOHISTOCHEMICAL AND MORPHOMETRIC ANALYSIS PERFORMED MANUALLY The tumor cells are POSITIVE for Her2 (3+). Estrogen Receptor: 30%, POSITIVE, WEAK STAINING INTENSITY Progesterone Receptor: 15%, POSITIVE, MODERATE STAINING INTENSITY Proliferation Marker Ki67: 40%     03/13/2021 Initial Diagnosis   Malignant neoplasm of upper-outer quadrant of left breast in female, estrogen receptor positive ([place])   03/26/2021 Imaging   MRI Breast  IMPRESSION: 1. 2.5 cm biopsy-proven malignancy within the UPPER LEFT breast. 2. Abnormal lymph node with focal cortical thickening in the posterior UPPER-OUTER LEFT breast/LOWER LEFT axilla. 2nd-look ultrasound with possible biopsy is recommended. 3. Indeterminate 0.8 cm OUTER RIGHT breast mass. 2nd-look ultrasound with possible biopsy is recommended.   03/29/2021 Genetic Testing   Negative hereditary cancer genetic testing: no pathogenic variants detected in [facility] CustomNext-Cancer +RNAinsight Panel.  The report date is March 29, 2021.   The CustomNext-Cancer+RNAinsight panel offered by [facility] includes sequencing and rearrangement analysis for the following 47 genes:  APC, ATM, AXIN2, BARD1, BMPR1A, BRCA1, BRCA2, BRIP1, CDH1, CDK4, CDKN2A, CHEK2, DICER1, EPCAM, GREM1, HOXB13, MEN1, MLH1, MSH2, MSH3, MSH6, MUTYH, NBN, NF1, NF2, NTHL1, PALB2, PMS2, POLD1, POLE, PTEN, RAD51C, RAD51D, RECQL, RET, SDHA, SDHAF2, SDHB, SDHC, SDHD, SMAD4, SMARCA4, STK11, TP53, TSC1, TSC2, and VHL.  RNA data is routinely analyzed for use in variant interpretation for all genes.   04/04/2021 -  Chemotherapy    Patient is on Treatment Plan : BREAST  Docetaxel + Carboplatin + Trastuzumab + Pertuzumab  (TCHP) q21d      04/14/2021 Imaging   [place] Bilateral Breast  IMPRESSION: 1. No sonographic correlate for the 8 mm enhancing mass in the outer right breast. Recommendation is to prior seed with MRI guided biopsy. 2. An undulating, low lying lymph node along the 1 o'clock axis of the left breast demonstrates no areas of cortical thickening beyond 3 mm.   08/14/2021 -  Chemotherapy   Patient is on Treatment Plan : BREAST Trastuzumab + Pertuzumab q21d        INTERVAL HISTORY: CHOUA IKNER is here for a follow up of breast cancer. She was last seen by me on 07/24/21. She presents to the clinic alone. She reports she is still having some diarrhea and nausea from last cycle.   All other systems were reviewed with the patient and are negative.  MEDICAL HISTORY:  Past Medical History:  Diagnosis Date   Anxiety    Breast cancer ([place])    Depression    Family history of colon cancer 03/19/2021   History of kidney stones    Hypertension     SURGICAL HISTORY: Past Surgical History:  Procedure Laterality Date   CHOLECYSTECTOMY  2002   INSERTION OF MESH N/A 10/28/2016   Procedure: INSERTION OF MESH;  Surgeon: Clovis [name], MD;  Location: [facility];  Service: General;  Laterality: N/A;   PORTACATH PLACEMENT Right 04/02/2021   Procedure: INSERTION PORT-A-CATH;  Surgeon: Donne Hazel,  Rodman Key, MD;  Location: [facility];  Service: General;  Laterality: Right;   [place] N/A 10/28/2016   Procedure: LAPAROSCOPIC VENTRAL HERNIA REPAIR;  Surgeon: Clovis [name], MD;  Location: [facility];  Service: General;  Laterality: N/A;   WRIST SURGERY      I have reviewed the social history and family history with the patient and they are unchanged from previous note.  ALLERGIES:  is allergic to emetrol and no known allergies.  MEDICATIONS:  Current Outpatient Medications   Medication Sig Dispense Refill   amoxicillin-clavulanate (AUGMENTIN) 875-125 MG tablet Take 1 tablet by mouth every 12 (twelve) hours. 14 tablet 0   diphenoxylate-atropine (LOMOTIL) 2.5-0.025 MG tablet Take 2 tablets by mouth 4 (four) times daily as needed for diarrhea or loose stools. 90 tablet 1   hydrochlorothiazide (HYDRODIURIL) 25 MG tablet Take 25 mg by mouth daily.     lidocaine-prilocaine (EMLA) cream Apply to affected area once 30 g 3   LORazepam (ATIVAN) 1 MG tablet Take 1 tablet (1 mg total) by mouth every 8 (eight) hours as needed (nausea). 20 tablet 0   ondansetron (ZOFRAN) 8 MG tablet Take 1 tablet (8 mg total) by mouth 2 (two) times daily as needed (Nausea or vomiting). Start on the third day after chemotherapy. 30 tablet 1   potassium chloride SA (KLOR-CON M) 20 MEQ tablet Take 1 tablet (20 mEq total) by mouth daily. 20 tablet 1   prochlorperazine (COMPAZINE) 10 MG tablet Take 1 tablet (10 mg total) by mouth every 6 (six) hours as needed (Nausea or vomiting). 30 tablet 1   No current facility-administered medications for this visit.    PHYSICAL EXAMINATION: ECOG PERFORMANCE STATUS: 1 - Symptomatic but completely ambulatory  There were no vitals filed for this visit. Wt Readings from Last 3 Encounters:  07/29/21 208 lb 8 oz (94.6 kg)  07/24/21 211 lb 4 oz (95.8 kg)  07/02/21 214 lb 12 oz (97.4 kg)     GENERAL:alert, no distress and comfortable SKIN: skin color normal, no rashes or significant lesions EYES: normal, Conjunctiva are pink and non-injected, sclera clear  NEURO: alert & oriented x 3 with fluent speech  LABORATORY DATA:  I have reviewed the data as listed CBC Latest Ref Rng & Units 08/14/2021 07/24/2021 07/17/2021  WBC 4.0 - 10.5 K/uL 5.0 10.4 3.9(L)  Hemoglobin 12.0 - 15.0 g/dL 8.9(L) 9.8(L) 6.8(LL)  Hematocrit 36.0 - 46.0 % 27.4(L) 29.8(L) 20.9(L)  Platelets 150 - 400 K/uL 161 359 68(L)     CMP Latest Ref Rng & Units 08/14/2021 07/24/2021 06/30/2021   Glucose 70 - 99 mg/dL 142(H) 173(H) 178(H)  BUN 6 - 20 mg/dL 20 23(H) 15  Creatinine 0.44 - 1.00 mg/dL 0.71 0.85 0.82  Sodium 135 - 145 mmol/L 140 141 141  Potassium 3.5 - 5.1 mmol/L 3.4(L) 3.0(L) 3.4(L)  Chloride 98 - 111 mmol/L 104 102 105  CO2 22 - 32 mmol/L 28 24 22   Calcium 8.9 - 10.3 mg/dL 9.8 9.7 9.7  Total Protein 6.5 - 8.1 g/dL 7.2 7.5 6.8  Total Bilirubin 0.3 - 1.2 mg/dL 0.4 0.3 0.4  Alkaline Phos 38 - 126 U/L 50 54 53  AST 15 - 41 U/L 28 18 20   ALT 0 - 44 U/L 38 22 36      RADIOGRAPHIC STUDIES: I have personally reviewed the radiological images as listed and agreed with the findings in the report. No results found.  No orders of the defined types were placed in this encounter.  All questions were answered. The patient knows to call the clinic with any problems, questions or concerns. No barriers to learning was detected. The total time spent in the appointment was 30 minutes.     Truitt Merle, MD 08/14/2021   I, Wilburn Mylar, am acting as scribe for Truitt Merle, MD.   I have reviewed the above documentation for accuracy and completeness, and I agree with the above.

## 2021-08-15 ENCOUNTER — Other Ambulatory Visit: Payer: Self-pay | Admitting: General Surgery

## 2021-08-15 DIAGNOSIS — C50412 Malignant neoplasm of upper-outer quadrant of left female breast: Secondary | ICD-10-CM

## 2021-08-17 ENCOUNTER — Encounter: Payer: Self-pay | Admitting: Hematology

## 2021-08-18 ENCOUNTER — Encounter (HOSPITAL_BASED_OUTPATIENT_CLINIC_OR_DEPARTMENT_OTHER)
Admission: RE | Admit: 2021-08-18 | Discharge: 2021-08-18 | Disposition: A | Discharge status: Home / Self Care | Payer: Federal, State, Local not specified - PPO | Source: Ambulatory Visit | Attending: General Surgery | Admitting: General Surgery

## 2021-08-18 ENCOUNTER — Telehealth: Payer: Self-pay | Admitting: Hematology

## 2021-08-18 DIAGNOSIS — C50412 Malignant neoplasm of upper-outer quadrant of left female breast: Secondary | ICD-10-CM | POA: Diagnosis not present

## 2021-08-18 DIAGNOSIS — Z01818 Encounter for other preprocedural examination: Secondary | ICD-10-CM | POA: Insufficient documentation

## 2021-08-18 DIAGNOSIS — Z170 Estrogen receptor positive status [ER+]: Secondary | ICD-10-CM | POA: Diagnosis not present

## 2021-08-18 NOTE — Progress Notes (Signed)

## 2021-08-18 NOTE — Telephone Encounter (Signed)
Scheduled follow-up appointment per 1/19 los. Patient is aware.

## 2021-08-19 NOTE — Progress Notes (Signed)
Labs reviewed with Dr. Sabra Heck, will proceed with surgery as scheduled.

## 2021-08-22 ENCOUNTER — Ambulatory Visit
Admission: RE | Admit: 2021-08-22 | Discharge: 2021-08-22 | Disposition: A | Discharge status: Home / Self Care | Payer: Federal, State, Local not specified - PPO | Source: Ambulatory Visit | Attending: General Surgery | Admitting: General Surgery

## 2021-08-22 DIAGNOSIS — Z170 Estrogen receptor positive status [ER+]: Secondary | ICD-10-CM

## 2021-08-22 DIAGNOSIS — C50412 Malignant neoplasm of upper-outer quadrant of left female breast: Secondary | ICD-10-CM

## 2021-08-22 DIAGNOSIS — C50912 Malignant neoplasm of unspecified site of left female breast: Secondary | ICD-10-CM | POA: Diagnosis not present

## 2021-08-22 MED ORDER — BUPIVACAINE-EPINEPHRINE (PF) 0.25% -1:200000 IJ SOLN
INTRAMUSCULAR | Status: AC
  Filled 2021-08-22: qty 30

## 2021-08-25 ENCOUNTER — Ambulatory Visit (HOSPITAL_BASED_OUTPATIENT_CLINIC_OR_DEPARTMENT_OTHER): Payer: Federal, State, Local not specified - PPO | Admitting: Certified Registered"

## 2021-08-25 ENCOUNTER — Encounter (HOSPITAL_BASED_OUTPATIENT_CLINIC_OR_DEPARTMENT_OTHER)
Admission: RE | Disposition: A | Discharge status: Home / Self Care | Payer: Self-pay | Source: Home / Self Care | Attending: General Surgery

## 2021-08-25 ENCOUNTER — Other Ambulatory Visit: Payer: Self-pay

## 2021-08-25 ENCOUNTER — Encounter (HOSPITAL_BASED_OUTPATIENT_CLINIC_OR_DEPARTMENT_OTHER): Payer: Self-pay | Admitting: General Surgery

## 2021-08-25 ENCOUNTER — Ambulatory Visit
Admission: RE | Admit: 2021-08-25 | Discharge: 2021-08-25 | Disposition: A | Discharge status: Home / Self Care | Payer: Federal, State, Local not specified - PPO | Source: Ambulatory Visit | Attending: General Surgery | Admitting: General Surgery

## 2021-08-25 ENCOUNTER — Ambulatory Visit (HOSPITAL_BASED_OUTPATIENT_CLINIC_OR_DEPARTMENT_OTHER)
Admission: RE | Admit: 2021-08-25 | Discharge: 2021-08-25 | Disposition: A | Discharge status: Home / Self Care | Payer: Federal, State, Local not specified - PPO | Attending: General Surgery | Admitting: General Surgery

## 2021-08-25 DIAGNOSIS — F418 Other specified anxiety disorders: Secondary | ICD-10-CM | POA: Diagnosis not present

## 2021-08-25 DIAGNOSIS — C50412 Malignant neoplasm of upper-outer quadrant of left female breast: Secondary | ICD-10-CM

## 2021-08-25 DIAGNOSIS — Z170 Estrogen receptor positive status [ER+]: Secondary | ICD-10-CM

## 2021-08-25 DIAGNOSIS — Z853 Personal history of malignant neoplasm of breast: Secondary | ICD-10-CM | POA: Insufficient documentation

## 2021-08-25 DIAGNOSIS — C50912 Malignant neoplasm of unspecified site of left female breast: Secondary | ICD-10-CM | POA: Diagnosis not present

## 2021-08-25 DIAGNOSIS — Z9221 Personal history of antineoplastic chemotherapy: Secondary | ICD-10-CM | POA: Diagnosis not present

## 2021-08-25 DIAGNOSIS — R928 Other abnormal and inconclusive findings on diagnostic imaging of breast: Secondary | ICD-10-CM | POA: Diagnosis not present

## 2021-08-25 DIAGNOSIS — G8918 Other acute postprocedural pain: Secondary | ICD-10-CM | POA: Diagnosis not present

## 2021-08-25 DIAGNOSIS — I10 Essential (primary) hypertension: Secondary | ICD-10-CM | POA: Insufficient documentation

## 2021-08-25 DIAGNOSIS — N6031 Fibrosclerosis of right breast: Secondary | ICD-10-CM | POA: Diagnosis not present

## 2021-08-25 HISTORY — PX: BREAST LUMPECTOMY WITH RADIOACTIVE SEED AND SENTINEL LYMPH NODE BIOPSY: SHX6550

## 2021-08-25 LAB — POCT PREGNANCY, URINE: Preg Test, Ur: NEGATIVE

## 2021-08-25 SURGERY — BREAST LUMPECTOMY WITH RADIOACTIVE SEED AND SENTINEL LYMPH NODE BIOPSY
Anesthesia: General | Site: Breast | Laterality: Left

## 2021-08-25 MED ORDER — AMISULPRIDE (ANTIEMETIC) 5 MG/2ML IV SOLN: 10.0000 mg | Freq: Once | INTRAVENOUS | Status: DC | PRN

## 2021-08-25 MED ORDER — ACETAMINOPHEN 325 MG PO TABS: 325.0000 mg | ORAL_TABLET | ORAL | Status: DC | PRN

## 2021-08-25 MED ORDER — LACTATED RINGERS IV SOLN: INTRAVENOUS | Status: DC

## 2021-08-25 MED ORDER — OXYCODONE HCL 5 MG/5ML PO SOLN: 5.0000 mg | Freq: Once | ORAL | Status: DC | PRN

## 2021-08-25 MED ORDER — PROPOFOL 10 MG/ML IV BOLUS
INTRAVENOUS | Status: AC
  Filled 2021-08-25: qty 20

## 2021-08-25 MED ORDER — ACETAMINOPHEN 10 MG/ML IV SOLN: 1000.0000 mg | Freq: Once | INTRAVENOUS | Status: DC | PRN

## 2021-08-25 MED ORDER — DEXAMETHASONE SODIUM PHOSPHATE 10 MG/ML IJ SOLN
INTRAMUSCULAR | Status: DC | PRN
  Administered 2021-08-25: 10 mg via INTRAVENOUS

## 2021-08-25 MED ORDER — ACETAMINOPHEN 160 MG/5ML PO SOLN: 325.0000 mg | ORAL | Status: DC | PRN

## 2021-08-25 MED ORDER — BUPIVACAINE HCL (PF) 0.25 % IJ SOLN
INTRAMUSCULAR | Status: AC
  Filled 2021-08-25: qty 90

## 2021-08-25 MED ORDER — CHLORHEXIDINE GLUCONATE CLOTH 2 % EX PADS: 6.0000 | MEDICATED_PAD | Freq: Once | CUTANEOUS | Status: DC

## 2021-08-25 MED ORDER — PHENYLEPHRINE 40 MCG/ML (10ML) SYRINGE FOR IV PUSH (FOR BLOOD PRESSURE SUPPORT)
PREFILLED_SYRINGE | INTRAVENOUS | Status: AC
  Filled 2021-08-25: qty 10

## 2021-08-25 MED ORDER — FENTANYL CITRATE (PF) 100 MCG/2ML IJ SOLN: 25.0000 ug | INTRAMUSCULAR | Status: DC | PRN

## 2021-08-25 MED ORDER — LIDOCAINE HCL (CARDIAC) PF 100 MG/5ML IV SOSY
PREFILLED_SYRINGE | INTRAVENOUS | Status: DC | PRN
  Administered 2021-08-25: 40 mg via INTRAVENOUS

## 2021-08-25 MED ORDER — HYDROCODONE-ACETAMINOPHEN 10-325 MG PO TABS
1.0000 | ORAL_TABLET | Freq: Four times a day (QID) | ORAL | 0 refills | Status: DC | PRN
Start: 2021-08-25 — End: 2021-12-05

## 2021-08-25 MED ORDER — BUPIVACAINE HCL (PF) 0.25 % IJ SOLN
INTRAMUSCULAR | Status: DC | PRN
Start: 2021-08-25 — End: 2021-08-25
  Administered 2021-08-25: 9 mL

## 2021-08-25 MED ORDER — MIDAZOLAM HCL 2 MG/2ML IJ SOLN
INTRAMUSCULAR | Status: AC
  Filled 2021-08-25: qty 2

## 2021-08-25 MED ORDER — CEFAZOLIN SODIUM-DEXTROSE 2-4 GM/100ML-% IV SOLN
INTRAVENOUS | Status: AC
  Filled 2021-08-25: qty 100

## 2021-08-25 MED ORDER — ENSURE PRE-SURGERY PO LIQD: 296.0000 mL | Freq: Once | ORAL | Status: DC

## 2021-08-25 MED ORDER — KETOROLAC TROMETHAMINE 15 MG/ML IJ SOLN
INTRAMUSCULAR | Status: AC
  Filled 2021-08-25: qty 1

## 2021-08-25 MED ORDER — FENTANYL CITRATE (PF) 100 MCG/2ML IJ SOLN
INTRAMUSCULAR | Status: DC | PRN
  Administered 2021-08-25 (×4): 25 ug via INTRAVENOUS

## 2021-08-25 MED ORDER — ACETAMINOPHEN 500 MG PO TABS
1000.0000 mg | ORAL_TABLET | ORAL | Status: AC
Start: 2021-08-25 — End: 2021-08-25
  Administered 2021-08-25: 1000 mg via ORAL

## 2021-08-25 MED ORDER — MIDAZOLAM HCL 2 MG/2ML IJ SOLN
2.0000 mg | Freq: Once | INTRAMUSCULAR | Status: AC
Start: 2021-08-25 — End: 2021-08-25
  Administered 2021-08-25: 2 mg via INTRAVENOUS

## 2021-08-25 MED ORDER — FENTANYL CITRATE (PF) 100 MCG/2ML IJ SOLN
INTRAMUSCULAR | Status: AC
  Filled 2021-08-25: qty 2

## 2021-08-25 MED ORDER — FENTANYL CITRATE (PF) 100 MCG/2ML IJ SOLN
50.0000 ug | Freq: Once | INTRAMUSCULAR | Status: AC
  Administered 2021-08-25: 50 ug via INTRAVENOUS

## 2021-08-25 MED ORDER — PROPOFOL 10 MG/ML IV BOLUS
INTRAVENOUS | Status: DC | PRN
Start: 2021-08-25 — End: 2021-08-25
  Administered 2021-08-25: 200 mg via INTRAVENOUS

## 2021-08-25 MED ORDER — PHENYLEPHRINE HCL (PRESSORS) 10 MG/ML IV SOLN
INTRAVENOUS | Status: DC | PRN
  Administered 2021-08-25 (×2): 80 ug via INTRAVENOUS
  Administered 2021-08-25 (×2): 40 ug via INTRAVENOUS
  Administered 2021-08-25: 80 ug via INTRAVENOUS
  Administered 2021-08-25: 40 ug via INTRAVENOUS

## 2021-08-25 MED ORDER — OXYCODONE HCL 5 MG PO TABS: 5.0000 mg | ORAL_TABLET | Freq: Once | ORAL | Status: DC | PRN

## 2021-08-25 MED ORDER — ONDANSETRON HCL 4 MG/2ML IJ SOLN
INTRAMUSCULAR | Status: DC | PRN
Start: 2021-08-25 — End: 2021-08-25
  Administered 2021-08-25: 4 mg via INTRAVENOUS

## 2021-08-25 MED ORDER — KETOROLAC TROMETHAMINE 15 MG/ML IJ SOLN
15.0000 mg | INTRAMUSCULAR | Status: AC
  Administered 2021-08-25: 15 mg via INTRAVENOUS

## 2021-08-25 MED ORDER — MAGTRACE LYMPHATIC TRACER
INTRAMUSCULAR | Status: DC | PRN
  Administered 2021-08-25: 2 mL via INTRAMUSCULAR

## 2021-08-25 MED ORDER — BUPIVACAINE-EPINEPHRINE (PF) 0.5% -1:200000 IJ SOLN
INTRAMUSCULAR | Status: DC | PRN
  Administered 2021-08-25: 30 mL

## 2021-08-25 MED ORDER — ACETAMINOPHEN 500 MG PO TABS
ORAL_TABLET | ORAL | Status: AC
  Filled 2021-08-25: qty 2

## 2021-08-25 MED ORDER — PROMETHAZINE HCL 25 MG/ML IJ SOLN: 6.2500 mg | INTRAMUSCULAR | Status: DC | PRN

## 2021-08-25 MED ORDER — CEFAZOLIN SODIUM-DEXTROSE 2-4 GM/100ML-% IV SOLN
2.0000 g | INTRAVENOUS | Status: AC
  Administered 2021-08-25: 2 g via INTRAVENOUS

## 2021-08-25 SURGICAL SUPPLY — 52 items
ADH SKN CLS APL DERMABOND .7 (GAUZE/BANDAGES/DRESSINGS) ×1
APL PRP STRL LF DISP 70% ISPRP (MISCELLANEOUS) ×1
APPLIER CLIP 9.375 MED OPEN (MISCELLANEOUS) ×2
APR CLP MED 9.3 20 MLT OPN (MISCELLANEOUS) ×1
BINDER BREAST XLRG (GAUZE/BANDAGES/DRESSINGS) IMPLANT
BINDER BREAST XXLRG (GAUZE/BANDAGES/DRESSINGS) ×1 IMPLANT
BLADE SURG 15 STRL LF DISP TIS (BLADE) ×2 IMPLANT
BLADE SURG 15 STRL SS (BLADE) ×2
CANISTER SUC SOCK COL 7IN (MISCELLANEOUS) IMPLANT
CANISTER SUCT 1200ML W/VALVE (MISCELLANEOUS) IMPLANT
CHLORAPREP W/TINT 26 (MISCELLANEOUS) ×3 IMPLANT
CLIP APPLIE 9.375 MED OPEN (MISCELLANEOUS) IMPLANT
COVER BACK TABLE 60X90IN (DRAPES) ×3 IMPLANT
COVER MAYO STAND STRL (DRAPES) ×3 IMPLANT
COVER PROBE W GEL 5X96 (DRAPES) ×4 IMPLANT
DERMABOND ADVANCED (GAUZE/BANDAGES/DRESSINGS) ×1
DERMABOND ADVANCED .7 DNX12 (GAUZE/BANDAGES/DRESSINGS) ×2 IMPLANT
DRAPE LAPAROSCOPIC ABDOMINAL (DRAPES) ×3 IMPLANT
DRAPE UTILITY XL STRL (DRAPES) ×3 IMPLANT
ELECT COATED BLADE 2.86 ST (ELECTRODE) ×3 IMPLANT
ELECT REM PT RETURN 9FT ADLT (ELECTROSURGICAL) ×2
ELECTRODE REM PT RTRN 9FT ADLT (ELECTROSURGICAL) ×2 IMPLANT
GLOVE SURG ENC MOIS LTX SZ7 (GLOVE) ×5 IMPLANT
GLOVE SURG UNDER POLY LF SZ7.5 (GLOVE) ×3 IMPLANT
GOWN STRL REUS W/ TWL LRG LVL3 (GOWN DISPOSABLE) ×4 IMPLANT
GOWN STRL REUS W/TWL LRG LVL3 (GOWN DISPOSABLE) ×6
HEMOSTAT ARISTA ABSORB 3G PWDR (HEMOSTASIS) IMPLANT
KIT MARKER MARGIN INK (KITS) ×3 IMPLANT
NDL HYPO 25X1 1.5 SAFETY (NEEDLE) ×2 IMPLANT
NDL SAFETY ECLIPSE 18X1.5 (NEEDLE) IMPLANT
NEEDLE HYPO 18GX1.5 SHARP (NEEDLE) ×2
NEEDLE HYPO 25X1 1.5 SAFETY (NEEDLE) ×4 IMPLANT
NS IRRIG 1000ML POUR BTL (IV SOLUTION) IMPLANT
PACK BASIN DAY SURGERY FS (CUSTOM PROCEDURE TRAY) ×3 IMPLANT
PENCIL SMOKE EVACUATOR (MISCELLANEOUS) ×3 IMPLANT
RETRACTOR ONETRAX LX 90X20 (MISCELLANEOUS) ×1 IMPLANT
SLEEVE SCD COMPRESS KNEE MED (STOCKING) ×3 IMPLANT
SPONGE T-LAP 4X18 ~~LOC~~+RFID (SPONGE) ×4 IMPLANT
STRIP CLOSURE SKIN 1/2X4 (GAUZE/BANDAGES/DRESSINGS) ×3 IMPLANT
SUT MNCRL AB 4-0 PS2 18 (SUTURE) ×3 IMPLANT
SUT MON AB 5-0 PS2 18 (SUTURE) ×1 IMPLANT
SUT SILK 2 0 SH (SUTURE) ×2 IMPLANT
SUT VIC AB 2-0 SH 27 (SUTURE) ×6
SUT VIC AB 2-0 SH 27XBRD (SUTURE) ×2 IMPLANT
SUT VIC AB 3-0 SH 27 (SUTURE) ×4
SUT VIC AB 3-0 SH 27X BRD (SUTURE) ×2 IMPLANT
SYR CONTROL 10ML LL (SYRINGE) ×4 IMPLANT
TOWEL GREEN STERILE FF (TOWEL DISPOSABLE) ×3 IMPLANT
TRACER MAGTRACE VIAL (MISCELLANEOUS) ×1 IMPLANT
TRAY FAXITRON CT DISP (TRAY / TRAY PROCEDURE) ×3 IMPLANT
TUBE CONNECTING 20X1/4 (TUBING) ×1 IMPLANT
YANKAUER SUCT BULB TIP NO VENT (SUCTIONS) ×1 IMPLANT

## 2021-08-25 NOTE — H&P (Signed)
51 y.o. female who is seen today for left breast cancer follow up. In August, she noted a mass in her left breast. This was evaluated with mm and [place]. She was found to have a 2.1x2.1x1.9 cm mass with a negative ax u/s. Density is c. 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 She underwent mri that showed a 2.5 cm left breast mass, nodes normal. She had a right sided lesion that is a csl biopsied and concordant. She has completed chemo/antiher2 therapy. She has done well. Last treatment was last Thursday. She is tired but tolerated it well. Genetics were negative. Repeat mri shows complete radiologic resolution on left. There is new 8 mm mass on right. This was biopsied and is an FA.    Review of Systems: A complete review of systems was obtained from the patient. I have reviewed this information and discussed as appropriate with the patient. See HPI as well for other ROS.  Review of Systems  Constitutional: Positive for malaise/fatigue.  All other systems reviewed and are negative.  Medical History: Past Medical History:  Diagnosis Date   Anxiety   Depression   History of kidney stones   Hypertension   Patient Active Problem List  Diagnosis   Malignant neoplasm of upper-outer quadrant of left breast in female, estrogen receptor positive (CMS-HCC)   Past Surgical History:  Procedure Laterality Date   Ventral Hernia Repair; insertion of mesh N/A 10/28/2016  by Dr Romana Juniper   CHOLECYSTECTOMY N/A  2002   LAPAROSCOPIC TUBAL LIGATION N/A  1999   Wrist Surgery N/A  Date Unknown   Allergies  Allergen Reactions   Oxycodone Itching   Current Outpatient Medications on File Prior to Visit  Medication Sig Dispense Refill   fluticasone propionate (FLONASE) 50 mcg/actuation nasal spray fluticasone propionate 50 mcg/actuation nasal spray,suspension   hydroCHLOROthiazide (HYDRODIURIL) 25 MG tablet hydrochlorothiazide 25 mg tablet   ibuprofen (MOTRIN)  800 MG tablet ibuprofen 800 mg tablet    Family History  Problem Relation Age of Onset   High blood pressure (Hypertension) Mother   Diabetes Mother   Deep vein thrombosis (DVT or abnormal blood clot formation) Mother   Coronary Artery Disease (Blocked arteries around heart) Father   High blood pressure (Hypertension) Father   Cancer Other   High blood pressure (Hypertension) Other    Social History   Tobacco Use  Smoking Status Never  Smokeless Tobacco Never    Social History   Socioeconomic History   Marital status: Married  Tobacco Use   Smoking status: Never   Smokeless tobacco: Never  Vaping Use   Vaping Use: Never used  Substance and Sexual Activity   Alcohol use: Yes  Comment: 1-2 a month   Drug use: Never   Sexual activity: Defer   Objective:   Physical Exam Constitutional:  Appearance: Normal appearance.  Chest:  Breasts: Right: No inverted nipple, mass or nipple discharge.  Left: No inverted nipple, mass or nipple discharge.  Lymphadenopathy:  Upper Body:  Right upper body: No supraclavicular or axillary adenopathy.  Left upper body: No supraclavicular or axillary adenopathy.  Neurological:  Mental Status: She is alert.    Assessment and Plan:   Malignant neoplasm of upper-outer quadrant of left breast in female, estrogen receptor positive (CMS-HCC)  left seed guided lumpectomy, left ax sn biopsy  We discussed a sentinel lymph node biopsy again as she does not appear to having lymph  node involvement right now. We discussed the performance of that with injection of radioactive tracer. We discussed that there is a chance of having a positive node with a sentinel lymph node biopsy and we will await the permanent pathology to make any other first further decisions in terms of her treatment. We discussed up to a 5% risk lifetime of chronic shoulder pain as well as lymphedema associated with a sentinel lymph node biopsy. We discussed the options for  treatment of the breast cancer which included lumpectomy versus a mastectomy. We discussed the performance of the lumpectomy with radioactive seed placement. We discussed a 5-10% chance of a positive margin requiring reexcision in the operating room. We also discussed that she will need radiation therapy if she undergoes lumpectomy. We discussed mastectomy and the postoperative care for that as well. Mastectomy can be followed by reconstruction. The decision for lumpectomy vs mastectomy has no impact on decision for chemotherapy. Most mastectomy patients will not need radiation therapy. We discussed that there is no difference in her survival whether she undergoes lumpectomy with radiation therapy or antiestrogen therapy versus a mastectomy. There is also no real difference between her recurrence in the breast. We discussed the risks of operation including bleeding, infection, possible reoperation. She understands her further therapy will be based on what her stages at the time of her operation.

## 2021-08-25 NOTE — Op Note (Signed)
Preoperative diagnosis: Clinical stage II left breast cancer status post primary systemic therapy Postoperative diagnosis: Same as above Procedure: 1.  Left breast radioactive seed guided lumpectomy 2.  Left deep axillary sentinel lymph node biopsy 3.  Injection of mag trace for sentinel lymph node identification Surgeon: Dr. Serita Grammes Anesthesia: General with a pectoral block Estimated blood loss: Minimal Complications: None Drains: None Specimens: 1.  Left breast tissue marked with paint containing seed and clip 2.  Additional anterior, medial, superior, posterior margins marked short superior, long lateral, double deep 3.  Left deep axillary sentinel lymph nodes with the highest count of 2775 Special count was correct at completion Disposition to recovery stable condition  Indications: This a 51 year old female who I initially saw in August with a left breast mass.  She had by MRI a 2.5 cm left breast mass with normal lymph nodes.  This had been biopsied was a grade 3 invasive ductal carcinoma that was 30% ER positive, 15% PR positive, HER2 was positive.  She has completed primary systemic therapy.  Her genetic testing was negative.  A repeat MRI shows complete radiologic resolution on the left side.  She had a previous complex sclerosing lesion biopsied on the right side as well as a fibroadenoma that was biopsied after chemotherapy.  In discussion with radiology we have elected not to excise either 1 of these.  Procedure: After informed consent was obtained I first injected mag trace.  I prepped the area.  I then injected 2 cc of mag trace in the subareolar position.  She was then given a pectoral block.  SCDs were in place.  Antibiotics were given.  She was placed under general anesthesia without complication.  She was prepped and draped in the standard sterile surgical fashion.  A surgical timeout was then performed.  Identified the radioactive seed in the central breast.  I  filtrated Marcaine and made a periareolar incision in order to hide the scar later.  I then dissected down to the seed.  Using the neoprobe for guidance I excised the seed and the surrounding tissue with an attempt to get a clear margin.  I confirm removal of the seed and the clip with mammography.  I then did a 3D CT image.  I thought I might be close in several margins so I excised these as above.  Hemostasis was obtained.  I closed the cavity with 2-0 Vicryl.  The skin was closed with 3-0 Vicryl and 5-0 Monocryl.  Glue and Steri-Strips were eventually applied.  I was able to identify the tracer in the axilla.  I infiltrated Marcaine and made an incision below the axillary hairline.  I dissected through the fascia.  She had several dark stained nodes as well as a couple that had very high activity with mag trace.  I excised these in a bundle as they were all together.  There were several palpable nodes that were present.  I passed these off the table as sentinel nodes.  There were no other palpable nodes and no other discolored nodes and the background activity was 0.  I then obtained hemostasis.  I closed this with 2-0 Vicryl, 3-0 Vicryl, and 4 Monocryl.  Glue and Steri-Strips were later applied.  She tolerated this well was extubated and transferred to recovery stable.

## 2021-08-25 NOTE — Anesthesia Postprocedure Evaluation (Signed)
Anesthesia Post Note  Patient: Joanna Osborn  Procedure(s) Performed: LEFT BREAST LUMPECTOMY WITH RADIOACTIVE SEED AND AXILLARY SENTINEL LYMPH NODE BIOPSY (Left: Breast)     Patient location during evaluation: PACU Anesthesia Type: General Level of consciousness: awake and alert Pain management: pain level controlled Vital Signs Assessment: post-procedure vital signs reviewed and stable Respiratory status: spontaneous breathing, nonlabored ventilation, respiratory function stable and patient connected to nasal cannula oxygen Cardiovascular status: blood pressure returned to baseline and stable Postop Assessment: no apparent nausea or vomiting Anesthetic complications: no   No notable events documented.  Last Vitals:  Vitals:   08/25/21 0914 08/25/21 0951  BP: (!) 123/91 127/90  Pulse: 95 96  Resp: 18 18  Temp:  36.7 C  SpO2: 95% 94%    Last Pain:  Vitals:   08/25/21 0951  TempSrc:   PainSc: 3                  Effie Berkshire

## 2021-08-25 NOTE — Interval H&P Note (Signed)
History and Physical Interval Note:  08/25/2021 6:54 AM  Joanna Osborn  has presented today for surgery, with the diagnosis of LEFT BREAST CANCER.  The various methods of treatment have been discussed with the patient and family. After consideration of risks, benefits and other options for treatment, the patient has consented to  Procedure(s): LEFT BREAST LUMPECTOMY WITH RADIOACTIVE SEED AND AXILLARY SENTINEL LYMPH NODE BIOPSY (Left) as a surgical intervention.  The patient's history has been reviewed, patient examined, no change in status, stable for surgery.  I have reviewed the patient's chart and labs.  Questions were answered to the patient's satisfaction.     Rolm Bookbinder

## 2021-08-25 NOTE — Discharge Instructions (Addendum)
[place] Office Phone Number [phone]  BREAST BIOPSY/ PARTIAL MASTECTOMY: POST OP INSTRUCTIONS Take 400 mg of ibuprofen every 8 hours or 650 mg tylenol every 6 hours for next 72 hours then as needed (STARTING AFTER 1PM 08/25/21). Use ice several times daily also. Always review your discharge instruction sheet given to you by the facility where your surgery was performed.  IF YOU HAVE DISABILITY OR FAMILY LEAVE FORMS, YOU MUST BRING THEM TO THE OFFICE FOR PROCESSING.  DO NOT GIVE THEM TO YOUR DOCTOR.  A prescription for pain medication may be given to you upon discharge.  Take your pain medication as prescribed, if needed.  If narcotic pain medicine is not needed, then you may take acetaminophen (Tylenol), naprosyn (Alleve) or ibuprofen (Advil) as needed. Take your usually prescribed medications unless otherwise directed If you need a refill on your pain medication, please contact your pharmacy.  They will contact our office to request authorization.  Prescriptions will not be filled after 5pm or on week-ends. You should eat very light the first 24 hours after surgery, such as soup, crackers, pudding, etc.  Resume your normal diet the day after surgery. Most patients will experience some swelling and bruising in the breast.  Ice packs and a good support bra will help.  Wear the breast binder provided or a sports bra for 72 hours day and night.  After that wear a sports bra during the day until you return to the office. Swelling and bruising can take several days to resolve.  It is common to experience some constipation if taking pain medication after surgery.  Increasing fluid intake and taking a stool softener will usually help or prevent this problem from occurring.  A mild laxative (Milk of Magnesia or Miralax) should be taken according to package directions if there are no bowel movements after 48 hours. Unless discharge instructions indicate otherwise, you may remove your  bandages 48 hours after surgery and you may shower at that time.  You may have steri-strips (small skin tapes) in place directly over the incision.  These strips should be left on the skin for 7-10 days and will come off on their own.  If your surgeon used skin glue on the incision, you may shower in 24 hours.  The glue will flake off over the next 2-3 weeks.  Any sutures or staples will be removed at the office during your follow-up visit. ACTIVITIES:  You may resume regular daily activities (gradually increasing) beginning the next day.  Wearing a good support bra or sports bra minimizes pain and swelling.  You may have sexual intercourse when it is comfortable. You may drive when you no longer are taking prescription pain medication, you can comfortably wear a seatbelt, and you can safely maneuver your car and apply brakes. RETURN TO WORK:  ______________________________________________________________________________________ Dennis Bast should see your doctor in the office for a follow-up appointment approximately two weeks after your surgery.  Your doctors nurse will typically make your follow-up appointment when she calls you with your pathology report.  Expect your pathology report 3-4 business days after your surgery.  You may call to check if you do not hear from [place] after three days. OTHER INSTRUCTIONS: _______________________________________________________________________________________________ _____________________________________________________________________________________________________________________________________ _____________________________________________________________________________________________________________________________________ _____________________________________________________________________________________________________________________________________  WHEN TO CALL DR WAKEFIELD: Fever over 101.0 Nausea and/or vomiting. Extreme swelling or bruising. Continued  bleeding from incision. Increased pain, redness, or drainage from the incision.  The clinic staff is available to answer your questions during regular business hours.  Please  dont hesitate to call and ask to speak to one of the nurses for clinical concerns.  If you have a medical emergency, go to the nearest emergency room or call 911.  A surgeon from [facility] is always on call at the hospital.  For further questions, please visit [email or web address]   Post Anesthesia Home Care Instructions  Activity: Get plenty of rest for the remainder of the day. A responsible individual must stay with you for 24 hours following the procedure.  For the next 24 hours, DO NOT: -Drive a car -Paediatric nurse -Drink alcoholic beverages -Take any medication unless instructed by your physician -Make any legal decisions or sign important papers.  Meals: Start with liquid foods such as gelatin or soup. Progress to regular foods as tolerated. Avoid greasy, spicy, heavy foods. If nausea and/or vomiting occur, drink only clear liquids until the nausea and/or vomiting subsides. Call your physician if vomiting continues.  Special Instructions/Symptoms: Your throat may feel dry or sore from the anesthesia or the breathing tube placed in your throat during surgery. If this causes discomfort, gargle with warm salt water. The discomfort should disappear within 24 hours.  If you had a scopolamine patch placed behind your ear for the management of post- operative nausea and/or vomiting:  1. The medication in the patch is effective for 72 hours, after which it should be removed.  Wrap patch in a tissue and discard in the trash. Wash hands thoroughly with soap and water. 2. You may remove the patch earlier than 72 hours if you experience unpleasant side effects which may include dry mouth, dizziness or visual disturbances. 3. Avoid touching the patch. Wash your hands with soap and water after  contact with the patch.    Regional Anesthesia Blocks  1. Numbness or the inability to move the "blocked" extremity may last from 3-48 hours after placement. The length of time depends on the medication injected and your individual response to the medication. If the numbness is not going away after 48 hours, call your surgeon.  2. The extremity that is blocked will need to be protected until the numbness is gone and the  Strength has returned. Because you cannot feel it, you will need to take extra care to avoid injury. Because it may be weak, you may have difficulty moving it or using it. You may not know what position it is in without looking at it while the block is in effect.  3. For blocks in the legs and feet, returning to weight bearing and walking needs to be done carefully. You will need to wait until the numbness is entirely gone and the strength has returned. You should be able to move your leg and foot normally before you try and bear weight or walk. You will need someone to be with you when you first try to ensure you do not fall and possibly risk injury.  4. Bruising and tenderness at the needle site are common side effects and will resolve in a few days.  5. Persistent numbness or new problems with movement should be communicated to the surgeon or the [place] [phone] [place] [phone]).

## 2021-08-25 NOTE — Anesthesia Procedure Notes (Signed)
Anesthesia Regional Block: Pectoralis block   Pre-Anesthetic Checklist: , timeout performed,  Correct Patient, Correct Site, Correct Laterality,  Correct Procedure, Correct Position, site marked,  Risks and benefits discussed,  Surgical consent,  Pre-op evaluation,  At surgeon's request and post-op pain management  Laterality: Left  Prep: chloraprep       Needles:  Injection technique: Single-shot  Needle Type: Echogenic Stimulator Needle     Needle Length: 9cm  Needle Gauge: 21     Additional Needles:   Procedures:,,,, ultrasound used (permanent image in chart),,    Narrative:  Start time: 08/25/2021 6:55 AM End time: 08/25/2021 7:00 AM Injection made incrementally with aspirations every 5 mL.  Performed by: Personally  Anesthesiologist: Effie Berkshire, MD  Additional Notes: Patient tolerated the procedure well. Local anesthetic introduced in an incremental fashion under minimal resistance after negative aspirations. No paresthesias were elicited. After completion of the procedure, no acute issues were identified and patient continued to be monitored by RN.

## 2021-08-25 NOTE — Anesthesia Preprocedure Evaluation (Addendum)
Anesthesia Evaluation  Patient identified by MRN, date of birth, ID band Patient awake    Reviewed: Allergy & Precautions, NPO status , Patient's Chart, lab work & pertinent test results  Airway Mallampati: II  TM Distance: >3 FB Neck ROM: Full    Dental  (+) Teeth Intact, Dental Advisory Given   Pulmonary neg pulmonary ROS,    breath sounds clear to auscultation       Cardiovascular hypertension, Pt. on medications  Rhythm:Regular Rate:Normal  Echo: 1. Left ventricular ejection fraction, by estimation, is 60 to 65%. The  left ventricle has normal function. The left ventricle has no regional  wall motion abnormalities. There is mild left ventricular hypertrophy of  the basal-septal segment. Left  ventricular diastolic parameters are consistent with Grade I diastolic  dysfunction (impaired relaxation). The average left ventricular global  longitudinal strain is -21.0 %. The global longitudinal strain is normal.  2. Right ventricular systolic function is normal. The right ventricular  size is mildly enlarged. There is normal pulmonary artery systolic  pressure.  3. The mitral valve is normal in structure. Trivial mitral valve  regurgitation. No evidence of mitral stenosis.  4. The aortic valve is tricuspid. Aortic valve regurgitation is not  visualized. Mild aortic valve sclerosis is present, with no evidence of  aortic valve stenosis.  5. The inferior vena cava is normal in size with greater than 50%  respiratory variability, suggesting right atrial pressure of 3 mmHg.    Neuro/Psych PSYCHIATRIC DISORDERS Anxiety Depression negative neurological ROS     GI/Hepatic negative GI ROS, Neg liver ROS,   Endo/Other  negative endocrine ROS  Renal/GU negative Renal ROS     Musculoskeletal negative musculoskeletal ROS (+)   Abdominal Normal abdominal exam  (+)   Peds  Hematology negative hematology ROS (+)    Anesthesia Other Findings   Reproductive/Obstetrics                            Anesthesia Physical Anesthesia Plan  ASA: 2  Anesthesia Plan: General   Post-op Pain Management: Regional block   Induction: Intravenous  PONV Risk Score and Plan: 4 or greater and Ondansetron, Dexamethasone, Midazolam and Scopolamine patch - Pre-op  Airway Management Planned: LMA  Additional Equipment: None  Intra-op Plan:   Post-operative Plan: Extubation in OR  Informed Consent: I have reviewed the patients History and Physical, chart, labs and discussed the procedure including the risks, benefits and alternatives for the proposed anesthesia with the patient or authorized representative who has indicated his/her understanding and acceptance.     Dental advisory given  Plan Discussed with: CRNA  Anesthesia Plan Comments:        Anesthesia Quick Evaluation

## 2021-08-25 NOTE — Progress Notes (Signed)
Assisted Dr. Hollis with left, ultrasound guided, pectoralis block. Side rails up, monitors on throughout procedure. See vital signs in flow sheet. Tolerated Procedure well. 

## 2021-08-25 NOTE — Transfer of Care (Signed)
Immediate Anesthesia Transfer of Care Note  Patient: Joanna Osborn  Procedure(s) Performed: LEFT BREAST LUMPECTOMY WITH RADIOACTIVE SEED AND AXILLARY SENTINEL LYMPH NODE BIOPSY (Left: Breast)  Patient Location: PACU  Anesthesia Type:GA combined with regional for post-op pain  Level of Consciousness: drowsy  Airway & Oxygen Therapy: Patient Spontanous Breathing and Patient connected to face mask oxygen  Post-op Assessment: Report given to RN and Post -op Vital signs reviewed and stable  Post vital signs: Reviewed and stable  Last Vitals:  Vitals Value Taken Time  BP 116/87 08/25/21 0837  Temp    Pulse 96 08/25/21 0838  Resp 23 08/25/21 0838  SpO2 100 % 08/25/21 0838  Vitals shown include unvalidated device data.  Last Pain:  Vitals:   08/25/21 8891  TempSrc: Oral  PainSc: 0-No pain         Complications: No notable events documented.

## 2021-08-25 NOTE — Anesthesia Procedure Notes (Signed)
Procedure Name: LMA Insertion Date/Time: 08/25/2021 7:25 AM Performed by: Lavonia Dana, CRNA Pre-anesthesia Checklist: Patient identified, Emergency Drugs available, Suction available and Patient being monitored Patient Re-evaluated:Patient Re-evaluated prior to induction Oxygen Delivery Method: Circle system utilized Preoxygenation: Pre-oxygenation with 100% oxygen Induction Type: IV induction Ventilation: Mask ventilation without difficulty LMA: LMA inserted LMA Size: 4.0 Number of attempts: 1 Airway Equipment and Method: Bite block Placement Confirmation: positive ETCO2 Tube secured with: Tape Dental Injury: Teeth and Oropharynx as per pre-operative assessment

## 2021-08-27 ENCOUNTER — Encounter (HOSPITAL_BASED_OUTPATIENT_CLINIC_OR_DEPARTMENT_OTHER): Payer: Self-pay | Admitting: General Surgery

## 2021-08-28 ENCOUNTER — Encounter: Payer: Self-pay | Admitting: *Deleted

## 2021-08-28 ENCOUNTER — Telehealth: Payer: Self-pay | Admitting: *Deleted

## 2021-08-28 DIAGNOSIS — Z170 Estrogen receptor positive status [ER+]: Secondary | ICD-10-CM

## 2021-08-28 DIAGNOSIS — C50412 Malignant neoplasm of upper-outer quadrant of left female breast: Secondary | ICD-10-CM

## 2021-08-28 NOTE — Telephone Encounter (Signed)
Called pt, discussed final pathology of pCR and LN negative.  Informed pt next step is to continue herceptin/perjeta with Dr. Burr Medico and xrt with Dr. Lisbeth Renshaw. Received verbal understanding. Confirmed appt with Dr. Burr Medico Referral placed for pt to see Dr .Lisbeth Renshaw

## 2021-09-03 ENCOUNTER — Other Ambulatory Visit: Payer: Self-pay

## 2021-09-03 ENCOUNTER — Ambulatory Visit (HOSPITAL_COMMUNITY)
Admission: RE | Admit: 2021-09-03 | Discharge: 2021-09-03 | Disposition: A | Discharge status: Home / Self Care | Payer: Federal, State, Local not specified - PPO | Source: Ambulatory Visit | Attending: Hematology | Admitting: Hematology

## 2021-09-03 DIAGNOSIS — Z01818 Encounter for other preprocedural examination: Secondary | ICD-10-CM | POA: Diagnosis not present

## 2021-09-03 DIAGNOSIS — I10 Essential (primary) hypertension: Secondary | ICD-10-CM | POA: Diagnosis not present

## 2021-09-03 DIAGNOSIS — C50412 Malignant neoplasm of upper-outer quadrant of left female breast: Secondary | ICD-10-CM | POA: Insufficient documentation

## 2021-09-03 DIAGNOSIS — Z170 Estrogen receptor positive status [ER+]: Secondary | ICD-10-CM | POA: Insufficient documentation

## 2021-09-03 DIAGNOSIS — Z0189 Encounter for other specified special examinations: Secondary | ICD-10-CM | POA: Diagnosis not present

## 2021-09-03 LAB — ECHOCARDIOGRAM COMPLETE
Area-P 1/2: 3.4 cm2
S' Lateral: 2.7 cm

## 2021-09-03 NOTE — Progress Notes (Signed)
°  Echocardiogram 2D Echocardiogram has been performed.  Darlina Sicilian M 09/03/2021, 9:53 AM

## 2021-09-04 ENCOUNTER — Ambulatory Visit: Payer: Federal, State, Local not specified - PPO

## 2021-09-04 ENCOUNTER — Encounter: Payer: Self-pay | Admitting: *Deleted

## 2021-09-04 ENCOUNTER — Other Ambulatory Visit: Payer: Federal, State, Local not specified - PPO

## 2021-09-05 ENCOUNTER — Encounter: Payer: Self-pay | Admitting: Hematology

## 2021-09-05 ENCOUNTER — Inpatient Hospital Stay: Payer: Federal, State, Local not specified - PPO

## 2021-09-05 ENCOUNTER — Inpatient Hospital Stay: Payer: Federal, State, Local not specified - PPO | Attending: Hematology

## 2021-09-05 ENCOUNTER — Other Ambulatory Visit: Payer: Self-pay

## 2021-09-05 ENCOUNTER — Inpatient Hospital Stay (HOSPITAL_BASED_OUTPATIENT_CLINIC_OR_DEPARTMENT_OTHER): Payer: Federal, State, Local not specified - PPO | Admitting: Hematology

## 2021-09-05 VITALS — BP 125/89 | HR 86 | Temp 98.7°F | Resp 18 | Wt 211.5 lb

## 2021-09-05 DIAGNOSIS — Z170 Estrogen receptor positive status [ER+]: Secondary | ICD-10-CM

## 2021-09-05 DIAGNOSIS — Z5112 Encounter for antineoplastic immunotherapy: Secondary | ICD-10-CM | POA: Diagnosis not present

## 2021-09-05 DIAGNOSIS — C50412 Malignant neoplasm of upper-outer quadrant of left female breast: Secondary | ICD-10-CM | POA: Diagnosis not present

## 2021-09-05 DIAGNOSIS — Z95828 Presence of other vascular implants and grafts: Secondary | ICD-10-CM

## 2021-09-05 LAB — CMP (CANCER CENTER ONLY)
ALT: 39 U/L (ref 0–44)
AST: 35 U/L (ref 15–41)
Albumin: 4.2 g/dL (ref 3.5–5.0)
Alkaline Phosphatase: 55 U/L (ref 38–126)
Anion gap: 8 (ref 5–15)
BUN: 14 mg/dL (ref 6–20)
CO2: 27 mmol/L (ref 22–32)
Calcium: 9.8 mg/dL (ref 8.9–10.3)
Chloride: 103 mmol/L (ref 98–111)
Creatinine: 0.71 mg/dL (ref 0.44–1.00)
GFR, Estimated: >60 mL/min (ref >60)
Glucose, Bld: 102 mg/dL — ABNORMAL HIGH (ref 70–99)
Potassium: 3.4 mmol/L — ABNORMAL LOW (ref 3.5–5.1)
Sodium: 138 mmol/L (ref 135–145)
Total Bilirubin: 0.6 mg/dL (ref 0.3–1.2)
Total Protein: 7.2 g/dL (ref 6.5–8.1)

## 2021-09-05 LAB — CBC WITH DIFFERENTIAL (CANCER CENTER ONLY)
Abs Immature Granulocytes: 0.02 10*3/uL (ref 0.00–0.07)
Basophils Absolute: 0 10*3/uL (ref 0.0–0.1)
Basophils Relative: 0 %
Eosinophils Absolute: 0.1 10*3/uL (ref 0.0–0.5)
Eosinophils Relative: 1 %
HCT: 30.6 % — ABNORMAL LOW (ref 36.0–46.0)
Hemoglobin: 10.2 g/dL — ABNORMAL LOW (ref 12.0–15.0)
Immature Granulocytes: 0 %
Lymphocytes Relative: 36 %
Lymphs Abs: 2.1 10*3/uL (ref 0.7–4.0)
MCH: 32.4 pg (ref 26.0–34.0)
MCHC: 33.3 g/dL (ref 30.0–36.0)
MCV: 97.1 fL (ref 80.0–100.0)
Monocytes Absolute: 0.4 10*3/uL (ref 0.1–1.0)
Monocytes Relative: 7 %
Neutro Abs: 3.3 10*3/uL (ref 1.7–7.7)
Neutrophils Relative %: 56 %
Platelet Count: 459 10*3/uL — ABNORMAL HIGH (ref 150–400)
RBC: 3.15 MIL/uL — ABNORMAL LOW (ref 3.87–5.11)
RDW: 15.4 % (ref 11.5–15.5)
WBC Count: 5.9 10*3/uL (ref 4.0–10.5)
nRBC: 0 % (ref 0.0–0.2)

## 2021-09-05 MED ORDER — SODIUM CHLORIDE 0.9 % IV SOLN: Freq: Once | INTRAVENOUS | Status: AC

## 2021-09-05 MED ORDER — DIPHENHYDRAMINE HCL 25 MG PO CAPS
50.0000 mg | ORAL_CAPSULE | Freq: Once | ORAL | Status: AC
  Administered 2021-09-05: 50 mg via ORAL
  Filled 2021-09-05: qty 2

## 2021-09-05 MED ORDER — ACETAMINOPHEN 325 MG PO TABS
650.0000 mg | ORAL_TABLET | Freq: Once | ORAL | Status: AC
  Administered 2021-09-05: 650 mg via ORAL
  Filled 2021-09-05: qty 2

## 2021-09-05 MED ORDER — SODIUM CHLORIDE 0.9% FLUSH
10.0000 mL | Freq: Once | INTRAVENOUS | Status: AC
  Administered 2021-09-05: 10 mL

## 2021-09-05 MED ORDER — TRASTUZUMAB-DKST CHEMO 150 MG IV SOLR
6.0000 mg/kg | Freq: Once | INTRAVENOUS | Status: AC
  Administered 2021-09-05: 567 mg via INTRAVENOUS
  Filled 2021-09-05: qty 27

## 2021-09-05 MED ORDER — HEPARIN SOD (PORK) LOCK FLUSH 100 UNIT/ML IV SOLN
500.0000 [IU] | Freq: Once | INTRAVENOUS | Status: AC | PRN
  Administered 2021-09-05: 500 [IU]

## 2021-09-05 MED ORDER — SODIUM CHLORIDE 0.9% FLUSH
10.0000 mL | INTRAVENOUS | Status: DC | PRN
  Administered 2021-09-05: 10 mL

## 2021-09-05 NOTE — Patient Instructions (Signed)
[facility]  Discharge Instructions: ?Thank you for choosing [facility] to provide your oncology and hematology care.  ? ?If you have a lab appointment with the Cancer Center, please go directly to the Cancer Center and check in at the registration area. ?  ?Wear comfortable clothing and clothing appropriate for easy access to any Portacath or PICC line.  ? ?We strive to give you quality time with your provider. You may need to reschedule your appointment if you arrive late (15 or more minutes).  Arriving late affects you and other patients whose appointments are after yours.  Also, if you miss three or more appointments without notifying the office, you may be dismissed from the clinic at the provider?s discretion.    ?  ?For prescription refill requests, have your pharmacy contact our office and allow 72 hours for refills to be completed.   ? ?Today you received the following chemotherapy and/or immunotherapy agent: Trastuzumab ?  ?To help prevent nausea and vomiting after your treatment, we encourage you to take your nausea medication as directed. ? ?BELOW ARE SYMPTOMS THAT SHOULD BE REPORTED IMMEDIATELY: ?*FEVER GREATER THAN 100.4 F (38 ?C) OR HIGHER ?*CHILLS OR SWEATING ?*NAUSEA AND VOMITING THAT IS NOT CONTROLLED WITH YOUR NAUSEA MEDICATION ?*UNUSUAL SHORTNESS OF BREATH ?*UNUSUAL BRUISING OR BLEEDING ?*URINARY PROBLEMS (pain or burning when urinating, or frequent urination) ?*BOWEL PROBLEMS (unusual diarrhea, constipation, pain near the anus) ?TENDERNESS IN MOUTH AND THROAT WITH OR WITHOUT PRESENCE OF ULCERS (sore throat, sores in mouth, or a toothache) ?UNUSUAL RASH, SWELLING OR PAIN  ?UNUSUAL VAGINAL DISCHARGE OR ITCHING  ? ?Items with * indicate a potential emergency and should be followed up as soon as possible or go to the Emergency Department if any problems should occur. ? ?Please show the CHEMOTHERAPY ALERT CARD or IMMUNOTHERAPY ALERT CARD at check-in to  the Emergency Department and triage nurse. ? ?Should you have questions after your visit or need to cancel or reschedule your appointment, please contact [facility]  Dept: [phone]  and follow the prompts.  Office hours are 8:00 a.m. to 4:30 p.m. Monday - Friday. Please note that voicemails left after 4:00 p.m. may not be returned until the following business day.  We are closed weekends and major holidays. You have access to a nurse at all times for urgent questions. Please call the main number to the clinic Dept: [phone] and follow the prompts. ? ? ?For any non-urgent questions, you may also contact your provider using MyChart. We now offer e-Visits for anyone 18 and older to request care online for non-urgent symptoms. For details visit [email or web address]. ?  ?Also download the MyChart app! Go to the app store, search "MyChart", open the app, select [facility], and log in with your MyChart username and password. ? ?Due to Covid, a mask is required upon entering the hospital/clinic. If you do not have a mask, one will be given to you upon arrival. For doctor visits, patients may have 1 support person aged 18 or older with them. For treatment visits, patients cannot have anyone with them due to current Covid guidelines and our immunocompromised population.  ? ?

## 2021-09-05 NOTE — Progress Notes (Signed)
Joanna Osborn   [phone] Fax:([phone]   Clinic Follow up Note   Patient Care Team: Lennie Odor, [place] as PCP - General (Physician Assistant) Rockwell Germany, RN as Oncology Nurse Navigator Mauro Kaufmann, RN as Oncology Nurse Navigator Rolm Bookbinder, MD as Consulting Physician (General Surgery) Truitt Merle, MD as Consulting Physician (Hematology) Kyung Rudd, MD as Consulting Physician (Radiation Oncology)  Date of Service:  09/05/2021  CHIEF COMPLAINT: f/u of left breast cancer  CURRENT THERAPY:  Maintenance HP (herceptin, perjeta) with GCSF, starting 08/14/21  ASSESSMENT & PLAN:  Joanna Osborn is a 51 y.o. premenopausal female with   1. 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 Left mammogram on 03/05/21 showed 2.1 cm mass at 12 o'clock. Biopsy confirmed invasive ductal carcinoma, grade 3, weakly ER+ and Her2+ -Given her Her2+ disease, she began neoadjuvant TCHP q3 weeks for 6 cycles, to be followed by Herceptin w or wo Perjeta maintenance therapy for a total of one year.   -Breast MRI 03/26/21 showed additional areas of concern-- a left breast/axilla lymph node, and a right breast mass. Right breast biopsy 04/23/21 showed complex sclerosing lesion. -echo from 04/02/21 reviewed, no concerns. Repeat 09/03/21 stable. -She received 6 cycles of neoadjuvant TCHP with G-CSF 9/8-12/29/22. Docetaxel dose reduced from C4 due to neuropathy. -post-treatment breast MRI on 07/25/21 showed new 8 mm right breast mass with overall benign features; complete imaging response in left breast; no metastatic lymphadenopathy. She underwent second-look [place] and biopsy on 08/07/21, pathology was benign. -she moved to maintenance HP on 08/14/21.  -she underwent lumpectomy on 08/25/21 with Dr. Donne Hazel. She had a complete response, no residual disease seen. Given her good  response, she will continue maintenance with herceptin only to complete one year therapy, plan to change to injection from cycle 3 on. -Plan to start adjuvant antiestrogen therapy after radiation -I discussed role of ovarian suppression, she is interested in BSO.  I have started her on Zoladex injection in the next few months, and plan to start aromatase inhibitor after she finishes radiation. -Labs reviewed, adequate for treatment   2. Anxiety and depression, HTN -f/u PCP -continue HCTZ.  Advised her to hold if she is not drinking well or feels dizzy   3. Mild Anemia -her hgb has dropped recently secondary to chemo  -she still has periods, bleeding is light -She began oral iron -she received blood transfusion on 07/18/21.   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 Donne Hazel on 2/14 -re-consult with Dr. Lisbeth Renshaw and CT sim on 3/2 -Herceptin injection every 3 weeks -Started Zoladex injection in 6 weeks -Follow-up in 9 weeks   No problem-specific Assessment & Plan notes found for this encounter.   SUMMARY OF ONCOLOGIC HISTORY: Oncology History Overview Note   Cancer Staging  Malignant neoplasm of upper-outer quadrant of left breast in female, estrogen receptor positive ([place]) Staging form: Breast, AJCC 8th Edition - Clinical stage from 03/07/2021: Stage IB (cT2, cN0, cM0, G3, ER+, PR+, HER2+) - Signed by Truitt Merle, MD on 03/17/2021 Stage prefix: Initial diagnosis Histologic grading system: 3 grade system - Pathologic stage from 08/25/2021: No Stage Recommended (ypT0, pN0, cM0, G3, ER+, PR+, HER2+) -  Signed by Truitt Merle, MD on 09/05/2021 Stage prefix: Post-therapy Response to neoadjuvant therapy: Complete response Nuclear grade: G3 Histologic grading system: 3 grade  system     Malignant neoplasm of upper-outer quadrant of left breast in female, estrogen receptor positive ([place])  03/05/2021 Mammogram   Diagnostic Left Mammogram; Left Breast Ultrasound  IMPRESSION: At the palpable site of concern in the left breast at 12 o'clock there is a suspicious mass measuring 2.1 cm.   03/07/2021 Cancer Staging   Staging form: Breast, AJCC 8th Edition - Clinical stage from 03/07/2021: Stage IB (cT2, cN0, cM0, G3, ER+, PR+, HER2+) - Signed by Truitt Merle, MD on 03/17/2021 Stage prefix: Initial diagnosis Histologic grading system: 3 grade system    03/07/2021 Pathology Results   Diagnosis Breast, left, needle core biopsy, left breast 12 o' clock - INVASIVE DUCTAL CARCINOMA - SEE COMMENT Based on the biopsy, the carcinoma appears Nottingham grade 3 of 3  PROGNOSTIC INDICATORS Results: IMMUNOHISTOCHEMICAL AND MORPHOMETRIC ANALYSIS PERFORMED MANUALLY The tumor cells are POSITIVE for Her2 (3+). Estrogen Receptor: 30%, POSITIVE, WEAK STAINING INTENSITY Progesterone Receptor: 15%, POSITIVE, MODERATE STAINING INTENSITY Proliferation Marker Ki67: 40%     03/13/2021 Initial Diagnosis   Malignant neoplasm of upper-outer quadrant of left breast in female, estrogen receptor positive ([place])   03/26/2021 Imaging   MRI Breast  IMPRESSION: 1. 2.5 cm biopsy-proven malignancy within the UPPER LEFT breast. 2. Abnormal lymph node with focal cortical thickening in the posterior UPPER-OUTER LEFT breast/LOWER LEFT axilla. 2nd-look ultrasound with possible biopsy is recommended. 3. Indeterminate 0.8 cm OUTER RIGHT breast mass. 2nd-look ultrasound with possible biopsy is recommended.   03/29/2021 Genetic Testing   Negative hereditary cancer genetic testing: no pathogenic variants detected in [facility] CustomNext-Cancer +RNAinsight Panel.  The report date is March 29, 2021.   The CustomNext-Cancer+RNAinsight panel offered by [facility] includes sequencing and rearrangement  analysis for the following 47 genes:  APC, ATM, AXIN2, BARD1, BMPR1A, BRCA1, BRCA2, BRIP1, CDH1, CDK4, CDKN2A, CHEK2, DICER1, EPCAM, GREM1, HOXB13, MEN1, MLH1, MSH2, MSH3, MSH6, MUTYH, NBN, NF1, NF2, NTHL1, PALB2, PMS2, POLD1, POLE, PTEN, RAD51C, RAD51D, RECQL, RET, SDHA, SDHAF2, SDHB, SDHC, SDHD, SMAD4, SMARCA4, STK11, TP53, TSC1, TSC2, and VHL.  RNA data is routinely analyzed for use in variant interpretation for all genes.   04/04/2021 - 07/26/2021 Chemotherapy   Patient is on Treatment Plan : BREAST  Docetaxel + Carboplatin + Trastuzumab + Pertuzumab  (TCHP) q21d      04/14/2021 Imaging   [place] Bilateral Breast  IMPRESSION: 1. No sonographic correlate for the 8 mm enhancing mass in the outer right breast. Recommendation is to prior seed with MRI guided biopsy. 2. An undulating, low lying lymph node along the 1 o'clock axis of the left breast demonstrates no areas of cortical thickening beyond 3 mm.   08/14/2021 -  Chemotherapy   Patient is on Treatment Plan : BREAST Trastuzumab + Pertuzumab q21d     08/25/2021 Cancer Staging   Staging form: Breast, AJCC 8th Edition - Pathologic stage from 08/25/2021: No Stage Recommended (ypT0, pN0, cM0, G3, ER+, PR+, HER2+) - Signed by Truitt Merle, MD on 09/05/2021 Stage prefix: Post-therapy Response to neoadjuvant therapy: Complete response Nuclear grade: G3 Histologic grading system: 3 grade system    08/25/2021 Definitive Surgery   FINAL MICROSCOPIC DIAGNOSIS:   A. BREAST, LEFT, LUMPECTOMY:  - No evidence of residual carcinoma - complete therapeutic response  - Therapy related changes, including hyalin fibrosis  - Fibrocystic change with urinary  - See comment  B. BREAST, LEFT ADDITIONAL MEDIAL MARGIN, EXCISION:  - Fibrocystic change  - Negative for carcinoma   C. BREAST, LEFT ADDITIONAL SUPERIOR MARGIN, EXCISION:  - Fibrocystic change  - Negative for carcinoma   D. BREAST, LEFT ADDITIONAL POSTERIOR MARGIN, EXCISION:  - Benign breast  parenchyma  - Negative for carcinoma   E. BREAST, LEFT ADDITIONAL ANTERIOR MARGIN, EXCISION:  - Benign breast parenchyma  - Negative for carcinoma   F. LYMPH NODE, LEFT AXILLARY, SENTINEL, EXCISION:  - Lymph node, negative for carcinoma (0/1)   G. LYMPH NODE, LEFT AXILLARY, SENTINEL, EXCISION:  - Lymph node, negative for carcinoma (0/1)    COMMENT:  The appropriate pathologic stage (AJCC eighth edition) is ypT0 ypN0.       INTERVAL HISTORY:  Joanna Osborn is here for a follow up of breast cancer. She was last seen by me on 08/14/21. She presents to the clinic alone. She reports she is wearing a compression bra, which is helping her pain. She notes some soreness but adds she will see her surgeon next week.   All other systems were reviewed with the patient and are negative.  MEDICAL HISTORY:  Past Medical History:  Diagnosis Date   Anxiety    Breast cancer ([place])    Depression    Family history of colon cancer 03/19/2021   History of kidney stones    Hypertension     SURGICAL HISTORY: Past Surgical History:  Procedure Laterality Date   BREAST LUMPECTOMY WITH RADIOACTIVE SEED AND SENTINEL LYMPH NODE BIOPSY Left 08/25/2021   Procedure: LEFT BREAST LUMPECTOMY WITH RADIOACTIVE SEED AND AXILLARY SENTINEL LYMPH NODE BIOPSY;  Surgeon: Rolm Bookbinder, MD;  Location: [place];  Service: General;  Laterality: Left;   CHOLECYSTECTOMY  2002   INSERTION OF MESH N/A 10/28/2016   Procedure: INSERTION OF MESH;  Surgeon: Clovis [name], MD;  Location: [place];  Service: General;  Laterality: N/A;   PORTACATH PLACEMENT Right 04/02/2021   Procedure: INSERTION PORT-A-CATH;  Surgeon: Rolm Bookbinder, MD;  Location: [place];  Service: General;  Laterality: Right;   [place] N/A 10/28/2016   Procedure: LAPAROSCOPIC VENTRAL HERNIA REPAIR;  Surgeon: Clovis [name], MD;  Location: [facility];  Service: General;  Laterality: N/A;    WRIST SURGERY      I have reviewed the social history and family history with the patient and they are unchanged from previous note.  ALLERGIES:  is allergic to endocet [oxycodone-acetaminophen] and no known allergies.  MEDICATIONS:  Current Outpatient Medications  Medication Sig Dispense Refill   amoxicillin-clavulanate (AUGMENTIN) 875-125 MG tablet Take 1 tablet by mouth every 12 (twelve) hours. 14 tablet 0   diphenoxylate-atropine (LOMOTIL) 2.5-0.025 MG tablet Take 2 tablets by mouth 4 (four) times daily as needed for diarrhea or loose stools. 90 tablet 1   hydrochlorothiazide (HYDRODIURIL) 25 MG tablet Take 25 mg by mouth daily.     HYDROcodone-acetaminophen (NORCO) 10-325 MG tablet Take 1 tablet by mouth every 6 (six) hours as needed. 10 tablet 0   LORazepam (ATIVAN) 1 MG tablet Take 1 tablet (1 mg total) by mouth every 8 (eight) hours as needed (nausea). 20 tablet 0   potassium chloride SA (KLOR-CON M) 20 MEQ tablet Take 1 tablet (20 mEq total) by mouth daily. 20 tablet 1   No current facility-administered medications for this visit.    PHYSICAL EXAMINATION: ECOG PERFORMANCE STATUS: 1 - Symptomatic but completely ambulatory  Vitals:  09/05/21 1308  BP: 125/89  Pulse: 86  Resp: 18  Temp: 98.7 F (37.1 C)  SpO2: 99%   Wt Readings from Last 3 Encounters:  09/05/21 211 lb 8 oz (95.9 kg)  08/25/21 213 lb 6.5 oz (96.8 kg)  08/14/21 210 lb 11.2 oz (95.6 kg)     GENERAL:alert, no distress and comfortable SKIN: skin color normal, no rashes or significant lesions EYES: normal, Conjunctiva are pink and non-injected, sclera clear  NEURO: alert & oriented x 3 with fluent speech  LABORATORY DATA:  I have reviewed the data as listed CBC Latest Ref Rng & Units 09/05/2021 08/14/2021 07/24/2021  WBC 4.0 - 10.5 K/uL 5.9 5.0 10.4  Hemoglobin 12.0 - 15.0 g/dL 10.2(L) 8.9(L) 9.8(L)  Hematocrit 36.0 - 46.0 % 30.6(L) 27.4(L) 29.8(L)  Platelets 150 - 400 K/uL 459(H) 161 359     CMP  Latest Ref Rng & Units 09/05/2021 08/14/2021 07/24/2021  Glucose 70 - 99 mg/dL 102(H) 142(H) 173(H)  BUN 6 - 20 mg/dL 14 20 23(H)  Creatinine 0.44 - 1.00 mg/dL 0.71 0.71 0.85  Sodium 135 - 145 mmol/L 138 140 141  Potassium 3.5 - 5.1 mmol/L 3.4(L) 3.4(L) 3.0(L)  Chloride 98 - 111 mmol/L 103 104 102  CO2 22 - 32 mmol/L 27 28 24   Calcium 8.9 - 10.3 mg/dL 9.8 9.8 9.7  Total Protein 6.5 - 8.1 g/dL 7.2 7.2 7.5  Total Bilirubin 0.3 - 1.2 mg/dL 0.6 0.4 0.3  Alkaline Phos 38 - 126 U/L 55 50 54  AST 15 - 41 U/L 35 28 18  ALT 0 - 44 U/L 39 38 22      RADIOGRAPHIC STUDIES: I have personally reviewed the radiological images as listed and agreed with the findings in the report. No results found.    No orders of the defined types were placed in this encounter.  All questions were answered. The patient knows to call the clinic with any problems, questions or concerns. No barriers to learning was detected. The total time spent in the appointment was 30 minutes.     Truitt Merle, MD 09/05/2021   I, Wilburn Mylar, am acting as scribe for Truitt Merle, MD.   I have reviewed the above documentation for accuracy and completeness, and I agree with the above.

## 2021-09-08 ENCOUNTER — Telehealth: Payer: Self-pay | Admitting: Hematology

## 2021-09-08 NOTE — Telephone Encounter (Signed)
Left message with follow-up appointments per 2/10 los.

## 2021-09-10 LAB — SURGICAL PATHOLOGY

## 2021-09-12 ENCOUNTER — Inpatient Hospital Stay (HOSPITAL_BASED_OUTPATIENT_CLINIC_OR_DEPARTMENT_OTHER)
Admission: EM | Admit: 2021-09-12 | Discharge: 2021-09-14 | DRG: 600 | Disposition: A | Discharge status: Home / Self Care | Payer: Federal, State, Local not specified - PPO | Attending: Internal Medicine | Admitting: Internal Medicine

## 2021-09-12 ENCOUNTER — Emergency Department (HOSPITAL_BASED_OUTPATIENT_CLINIC_OR_DEPARTMENT_OTHER): Payer: Federal, State, Local not specified - PPO

## 2021-09-12 ENCOUNTER — Other Ambulatory Visit: Payer: Self-pay

## 2021-09-12 ENCOUNTER — Ambulatory Visit
Admission: EM | Admit: 2021-09-12 | Discharge: 2021-09-12 | Disposition: A | Discharge status: Home / Self Care | Payer: Federal, State, Local not specified - PPO

## 2021-09-12 ENCOUNTER — Encounter (HOSPITAL_BASED_OUTPATIENT_CLINIC_OR_DEPARTMENT_OTHER): Payer: Self-pay | Admitting: *Deleted

## 2021-09-12 DIAGNOSIS — L0291 Cutaneous abscess, unspecified: Secondary | ICD-10-CM

## 2021-09-12 DIAGNOSIS — F32A Depression, unspecified: Secondary | ICD-10-CM | POA: Diagnosis present

## 2021-09-12 DIAGNOSIS — D72829 Elevated white blood cell count, unspecified: Secondary | ICD-10-CM | POA: Diagnosis present

## 2021-09-12 DIAGNOSIS — C50412 Malignant neoplasm of upper-outer quadrant of left female breast: Secondary | ICD-10-CM | POA: Diagnosis present

## 2021-09-12 DIAGNOSIS — E872 Acidosis, unspecified: Secondary | ICD-10-CM | POA: Diagnosis not present

## 2021-09-12 DIAGNOSIS — Z95828 Presence of other vascular implants and grafts: Secondary | ICD-10-CM

## 2021-09-12 DIAGNOSIS — Z91048 Other nonmedicinal substance allergy status: Secondary | ICD-10-CM

## 2021-09-12 DIAGNOSIS — R160 Hepatomegaly, not elsewhere classified: Secondary | ICD-10-CM | POA: Diagnosis not present

## 2021-09-12 DIAGNOSIS — Z800 Family history of malignant neoplasm of digestive organs: Secondary | ICD-10-CM | POA: Diagnosis not present

## 2021-09-12 DIAGNOSIS — R739 Hyperglycemia, unspecified: Secondary | ICD-10-CM | POA: Diagnosis present

## 2021-09-12 DIAGNOSIS — R519 Headache, unspecified: Secondary | ICD-10-CM | POA: Diagnosis not present

## 2021-09-12 DIAGNOSIS — E876 Hypokalemia: Secondary | ICD-10-CM | POA: Diagnosis not present

## 2021-09-12 DIAGNOSIS — K8689 Other specified diseases of pancreas: Secondary | ICD-10-CM | POA: Diagnosis not present

## 2021-09-12 DIAGNOSIS — Z20822 Contact with and (suspected) exposure to covid-19: Secondary | ICD-10-CM | POA: Diagnosis not present

## 2021-09-12 DIAGNOSIS — N611 Abscess of the breast and nipple: Principal | ICD-10-CM

## 2021-09-12 DIAGNOSIS — Z8249 Family history of ischemic heart disease and other diseases of the circulatory system: Secondary | ICD-10-CM | POA: Diagnosis not present

## 2021-09-12 DIAGNOSIS — D259 Leiomyoma of uterus, unspecified: Secondary | ICD-10-CM | POA: Diagnosis not present

## 2021-09-12 DIAGNOSIS — K869 Disease of pancreas, unspecified: Secondary | ICD-10-CM | POA: Diagnosis not present

## 2021-09-12 DIAGNOSIS — C50912 Malignant neoplasm of unspecified site of left female breast: Secondary | ICD-10-CM | POA: Diagnosis not present

## 2021-09-12 DIAGNOSIS — R197 Diarrhea, unspecified: Secondary | ICD-10-CM | POA: Diagnosis present

## 2021-09-12 DIAGNOSIS — R6510 Systemic inflammatory response syndrome (SIRS) of non-infectious origin without acute organ dysfunction: Principal | ICD-10-CM | POA: Diagnosis present

## 2021-09-12 DIAGNOSIS — Z170 Estrogen receptor positive status [ER+]: Secondary | ICD-10-CM | POA: Diagnosis not present

## 2021-09-12 DIAGNOSIS — R000 Tachycardia, unspecified: Secondary | ICD-10-CM | POA: Diagnosis not present

## 2021-09-12 DIAGNOSIS — R062 Wheezing: Secondary | ICD-10-CM | POA: Diagnosis present

## 2021-09-12 DIAGNOSIS — Z841 Family history of disorders of kidney and ureter: Secondary | ICD-10-CM

## 2021-09-12 DIAGNOSIS — Z9221 Personal history of antineoplastic chemotherapy: Secondary | ICD-10-CM

## 2021-09-12 DIAGNOSIS — F419 Anxiety disorder, unspecified: Secondary | ICD-10-CM | POA: Diagnosis not present

## 2021-09-12 DIAGNOSIS — R509 Fever, unspecified: Secondary | ICD-10-CM

## 2021-09-12 DIAGNOSIS — Z9851 Tubal ligation status: Secondary | ICD-10-CM

## 2021-09-12 DIAGNOSIS — I10 Essential (primary) hypertension: Secondary | ICD-10-CM | POA: Diagnosis present

## 2021-09-12 DIAGNOSIS — K760 Fatty (change of) liver, not elsewhere classified: Secondary | ICD-10-CM | POA: Diagnosis not present

## 2021-09-12 DIAGNOSIS — R059 Cough, unspecified: Secondary | ICD-10-CM | POA: Diagnosis not present

## 2021-09-12 DIAGNOSIS — Z886 Allergy status to analgesic agent status: Secondary | ICD-10-CM

## 2021-09-12 DIAGNOSIS — R0602 Shortness of breath: Secondary | ICD-10-CM | POA: Diagnosis not present

## 2021-09-12 DIAGNOSIS — Z9289 Personal history of other medical treatment: Secondary | ICD-10-CM

## 2021-09-12 DIAGNOSIS — K449 Diaphragmatic hernia without obstruction or gangrene: Secondary | ICD-10-CM | POA: Diagnosis not present

## 2021-09-12 LAB — CBC WITH DIFFERENTIAL/PLATELET
Abs Immature Granulocytes: 0.14 10*3/uL — ABNORMAL HIGH (ref 0.00–0.07)
Basophils Absolute: 0 10*3/uL (ref 0.0–0.1)
Basophils Relative: 0 %
Eosinophils Absolute: 0 10*3/uL (ref 0.0–0.5)
Eosinophils Relative: 0 %
HCT: 34.5 % — ABNORMAL LOW (ref 36.0–46.0)
Hemoglobin: 11.3 g/dL — ABNORMAL LOW (ref 12.0–15.0)
Immature Granulocytes: 1 %
Lymphocytes Relative: 9 %
Lymphs Abs: 1.3 10*3/uL (ref 0.7–4.0)
MCH: 31.7 pg (ref 26.0–34.0)
MCHC: 32.8 g/dL (ref 30.0–36.0)
MCV: 96.9 fL (ref 80.0–100.0)
Monocytes Absolute: 0.9 10*3/uL (ref 0.1–1.0)
Monocytes Relative: 6 %
Neutro Abs: 13 10*3/uL — ABNORMAL HIGH (ref 1.7–7.7)
Neutrophils Relative %: 84 %
Platelets: 399 10*3/uL (ref 150–400)
RBC: 3.56 MIL/uL — ABNORMAL LOW (ref 3.87–5.11)
RDW: 15.6 % — ABNORMAL HIGH (ref 11.5–15.5)
WBC: 15.4 10*3/uL — ABNORMAL HIGH (ref 4.0–10.5)
nRBC: 0 % (ref 0.0–0.2)

## 2021-09-12 LAB — COMPREHENSIVE METABOLIC PANEL WITH GFR
ALT: 43 U/L (ref 0–44)
AST: 38 U/L (ref 15–41)
Albumin: 3.7 g/dL (ref 3.5–5.0)
Alkaline Phosphatase: 62 U/L (ref 38–126)
Anion gap: 15 (ref 5–15)
BUN: 20 mg/dL (ref 6–20)
CO2: 23 mmol/L (ref 22–32)
Calcium: 9.3 mg/dL (ref 8.9–10.3)
Chloride: 95 mmol/L — ABNORMAL LOW (ref 98–111)
Creatinine, Ser: 0.95 mg/dL (ref 0.44–1.00)
GFR, Estimated: >60 mL/min
Glucose, Bld: 109 mg/dL — ABNORMAL HIGH (ref 70–99)
Potassium: 3.1 mmol/L — ABNORMAL LOW (ref 3.5–5.1)
Sodium: 133 mmol/L — ABNORMAL LOW (ref 135–145)
Total Bilirubin: 1 mg/dL (ref 0.3–1.2)
Total Protein: 8.2 g/dL — ABNORMAL HIGH (ref 6.5–8.1)

## 2021-09-12 LAB — LACTIC ACID, PLASMA
Lactic Acid, Venous: 1.3 mmol/L (ref 0.5–1.9)
Lactic Acid, Venous: 1.1 mmol/L (ref 0.5–1.9)

## 2021-09-12 LAB — RESP PANEL BY RT-PCR (FLU A&B, COVID) ARPGX2
Influenza A by PCR: NEGATIVE
Influenza B by PCR: NEGATIVE
SARS Coronavirus 2 by RT PCR: NEGATIVE

## 2021-09-12 LAB — HCG, SERUM, QUALITATIVE: Preg, Serum: NEGATIVE

## 2021-09-12 LAB — MAGNESIUM: Magnesium: 1.8 mg/dL (ref 1.7–2.4)

## 2021-09-12 LAB — LIPASE, BLOOD: Lipase: 21 U/L (ref 11–51)

## 2021-09-12 MED ORDER — ACETAMINOPHEN 650 MG RE SUPP
650.0000 mg | Freq: Four times a day (QID) | RECTAL | Status: DC | PRN
Start: 2021-09-12 — End: 2021-09-14

## 2021-09-12 MED ORDER — IOHEXOL 350 MG/ML SOLN
100.0000 mL | Freq: Once | INTRAVENOUS | Status: AC | PRN
  Administered 2021-09-12: 100 mL via INTRAVENOUS

## 2021-09-12 MED ORDER — POTASSIUM CHLORIDE 20 MEQ PO PACK
40.0000 meq | PACK | Freq: Once | ORAL | Status: AC
  Administered 2021-09-12: 40 meq via ORAL
  Filled 2021-09-12: qty 2

## 2021-09-12 MED ORDER — VANCOMYCIN HCL IN DEXTROSE 1-5 GM/200ML-% IV SOLN
1000.0000 mg | Freq: Once | INTRAVENOUS | Status: AC
  Administered 2021-09-12: 1000 mg via INTRAVENOUS
  Filled 2021-09-12: qty 200

## 2021-09-12 MED ORDER — OXYCODONE HCL 5 MG PO TABS: 5.0000 mg | ORAL_TABLET | ORAL | Status: DC | PRN

## 2021-09-12 MED ORDER — ACETAMINOPHEN 325 MG PO TABS
650.0000 mg | ORAL_TABLET | Freq: Once | ORAL | Status: AC
  Administered 2021-09-12: 650 mg via ORAL
  Filled 2021-09-12: qty 2

## 2021-09-12 MED ORDER — DIPHENHYDRAMINE HCL 50 MG/ML IJ SOLN
12.5000 mg | Freq: Once | INTRAMUSCULAR | Status: AC
Start: 2021-09-12 — End: 2021-09-12
  Administered 2021-09-12: 12.5 mg via INTRAVENOUS
  Filled 2021-09-12: qty 1

## 2021-09-12 MED ORDER — LACTATED RINGERS IV SOLN: INTRAVENOUS | Status: DC

## 2021-09-12 MED ORDER — GUAIFENESIN-DM 100-10 MG/5ML PO SYRP
5.0000 mL | ORAL_SOLUTION | ORAL | Status: DC | PRN
  Administered 2021-09-12 – 2021-09-13 (×2): 5 mL via ORAL
  Filled 2021-09-12 (×2): qty 5

## 2021-09-12 MED ORDER — ENOXAPARIN SODIUM 40 MG/0.4ML IJ SOSY
40.0000 mg | PREFILLED_SYRINGE | INTRAMUSCULAR | Status: DC
  Administered 2021-09-13: 40 mg via SUBCUTANEOUS
  Filled 2021-09-12: qty 0.4

## 2021-09-12 MED ORDER — VANCOMYCIN HCL 1500 MG/300ML IV SOLN
1500.0000 mg | INTRAVENOUS | Status: DC
  Administered 2021-09-13: 1500 mg via INTRAVENOUS
  Filled 2021-09-12 (×3): qty 300

## 2021-09-12 MED ORDER — ACETAMINOPHEN 325 MG PO TABS
650.0000 mg | ORAL_TABLET | Freq: Four times a day (QID) | ORAL | Status: DC | PRN
  Administered 2021-09-12: 650 mg via ORAL
  Filled 2021-09-12 (×3): qty 2

## 2021-09-12 MED ORDER — ONDANSETRON HCL 4 MG/2ML IJ SOLN: 4.0000 mg | Freq: Four times a day (QID) | INTRAMUSCULAR | Status: DC | PRN

## 2021-09-12 MED ORDER — SODIUM CHLORIDE 0.9 % IV SOLN
2.0000 g | INTRAVENOUS | Status: DC
  Administered 2021-09-13: 2 g via INTRAVENOUS
  Filled 2021-09-12: qty 20

## 2021-09-12 MED ORDER — SODIUM CHLORIDE 0.9 % IV BOLUS
1000.0000 mL | Freq: Once | INTRAVENOUS | Status: AC
  Administered 2021-09-12: 1000 mL via INTRAVENOUS

## 2021-09-12 MED ORDER — ONDANSETRON HCL 4 MG PO TABS: 4.0000 mg | ORAL_TABLET | Freq: Four times a day (QID) | ORAL | Status: DC | PRN

## 2021-09-12 MED ORDER — SODIUM CHLORIDE 0.9 % IV SOLN
2.0000 g | Freq: Once | INTRAVENOUS | Status: AC
  Administered 2021-09-12: 2 g via INTRAVENOUS
  Filled 2021-09-12: qty 20

## 2021-09-12 MED ORDER — PROCHLORPERAZINE EDISYLATE 10 MG/2ML IJ SOLN
10.0000 mg | Freq: Once | INTRAMUSCULAR | Status: AC
  Administered 2021-09-12: 10 mg via INTRAVENOUS
  Filled 2021-09-12: qty 2

## 2021-09-12 NOTE — Assessment & Plan Note (Signed)
CT A/P showed 2 fluid collections in the left breast.  Lateral collection suspicious for abscess, more central collection may represent seroma versus abscess.  Patient did have fever up to 101.2 F and leukocytosis of 15.4 in the ED. -General surgery following -Ultrasound-guided aspiration by IR recommended, order placed -Continue empiric antibiotics with IV vancomycin and ceftriaxone -Follow-up blood cultures

## 2021-09-12 NOTE — H&P (Signed)
History and Physical    Joanna Osborn XNA:[id number] DOB: [birth year] DOA: 09/12/2021  PCP: Lennie Odor, PA  Patient coming [facility]  I have personally briefly reviewed patient's old medical records in [place]  Chief Complaint: Cough, headache  HPI: Joanna Osborn is a 51 y.o. female with medical history significant for left breast cancer s/p neoadjuvant chemotherapy followed by lumpectomy with seed placement 08/25/2021 now on post adjuvant treatment with trastuzumab (infusion 09/05/2021), hypertension, depression/anxiety who presented to the ED for evaluation of cough, headache, diarrhea.  Patient states on 09/09/2021 she developed headache, nonproductive cough, abdominal pain, and diarrhea.  She noticed some increased swelling and redness at her left breast.  She did see her surgeon, Dr. Donne Hazel, and follow-up the same day.  Patient states that area was attempted to be aspirated without any fluid obtained.  Since then she has developed chills with diaphoresis.  She has had continued cough and diarrhea with less abdominal pain and improving headache.  She otherwise denies any dyspnea, dysuria, lower extremity edema.  She says the only medication she is taking regularly is hydrochlorothiazide.  [facility] ED Course   Labs/Imaging on admission: I have personally reviewed following labs and imaging studies.  Initial vitals showed BP 111/76, pulse 112, RR 18, temp 1 1.2 F, SPO2 96% on room air.  Labs show WBC 15.4, hemoglobin 11.3, platelets 399,000, sodium 133, potassium 3.1, bicarb 23, BUN 20, creatinine 0.95, serum glucose 109, LFTs within normal limits, lipase 21, serum hCG negative, lactic acid 1.3, magnesium 1.8.  SARS-CoV-2 and influenza PCR negative.  Blood culture x1 in process.  2 view chest x-ray showed low lung volumes with stable right chest PowerPort.  CT head without contrast negative for acute intracranial abnormality.  CTA chest negative for evidence  of PE.  Postsurgical changes in the left axilla/left anterior chest wall with multiple fluid collections noted.  CT abdomen/pelvis with contrast showed 2 fluid collections in the left breast.  Lateral collection suspicious for abscess.  Central collection may represent seroma or abscess.  Mildly hypodense lesion in the uncinate process of the pancreas seen.  Steatotic enlarged liver and enlarged fibroid uterus also noted.  Patient was given 2 L normal saline and placed on maintenance fluids with LR.  She was given IV vancomycin and ceftriaxone.  EDP discussed with on-call general surgery who recommended needle aspiration and antibiotics and will follow if admitted.  Case discussed with IR who recommended ultrasound aspiration and able to drain over the weekend.  The hospitalist service was consulted to admit for further evaluation and management.  Review of Systems: All systems reviewed and are negative except as documented in history of present illness above.   Past Medical History:  Diagnosis Date   Anxiety    Breast cancer ([place])    Depression    Family history of colon cancer 03/19/2021   History of kidney stones    Hypertension     Past Surgical History:  Procedure Laterality Date   BREAST LUMPECTOMY WITH RADIOACTIVE SEED AND SENTINEL LYMPH NODE BIOPSY Left 08/25/2021   Procedure: LEFT BREAST LUMPECTOMY WITH RADIOACTIVE SEED AND AXILLARY SENTINEL LYMPH NODE BIOPSY;  Surgeon: Rolm Bookbinder, MD;  Location: [place];  Service: General;  Laterality: Left;   CHOLECYSTECTOMY  2002   INSERTION OF MESH N/A 10/28/2016   Procedure: INSERTION OF MESH;  Surgeon: Clovis [name], MD;  Location: [place];  Service: General;  Laterality: N/A;   PORTACATH  PLACEMENT Right 04/02/2021   Procedure: INSERTION PORT-A-CATH;  Surgeon: Rolm Bookbinder, MD;  Location: [place];  Service: General;  Laterality: Right;   [place] N/A 10/28/2016    Procedure: LAPAROSCOPIC VENTRAL HERNIA REPAIR;  Surgeon: Clovis [name], MD;  Location: [place];  Service: General;  Laterality: N/A;   WRIST SURGERY      Social History:  reports that she has never smoked. She has never used smokeless tobacco. She reports current alcohol use of about 1.0 standard drink per week. She reports that she does not use drugs.  Allergies  Allergen Reactions   Endocet [Oxycodone-Acetaminophen] Itching   No Known Allergies    Tape Rash    Family History  Problem Relation Age of Onset   Kidney disease Mother    Cancer Paternal Aunt        unknown type; dx after 39   Colon cancer Paternal Grandmother        dx unknown age   Colon cancer Paternal Grandfather        dx after 27   Hypertension Other    Cancer Other    Cancer Cousin        unknown type; dx before 27     Prior to Admission medications   Medication Sig Start Date End Date Taking? Authorizing Provider  amoxicillin-clavulanate (AUGMENTIN) 875-125 MG tablet Take 1 tablet by mouth every 12 (twelve) hours. 08/04/21   Teodora Medici, FNP  diphenoxylate-atropine (LOMOTIL) 2.5-0.025 MG tablet Take 2 tablets by mouth 4 (four) times daily as needed for diarrhea or loose stools. 05/19/21   Truitt Merle, MD  hydrochlorothiazide (HYDRODIURIL) 25 MG tablet Take 25 mg by mouth daily. 02/26/21   [provider]  HYDROcodone-acetaminophen (NORCO) 10-325 MG tablet Take 1 tablet by mouth every 6 (six) hours as needed. 08/25/21   Rolm Bookbinder, MD  LORazepam (ATIVAN) 1 MG tablet Take 1 tablet (1 mg total) by mouth every 8 (eight) hours as needed (nausea). 06/16/21   Truitt Merle, MD  potassium chloride SA (KLOR-CON M) 20 MEQ tablet Take 1 tablet (20 mEq total) by mouth daily. 07/27/21   Truitt Merle, MD  prochlorperazine (COMPAZINE) 10 MG tablet Take 1 tablet (10 mg total) by mouth every 6 (six) hours as needed (Nausea or vomiting). 03/19/21 09/05/21  Truitt Merle, MD    Physical Exam: Vitals:   09/12/21 1730  09/12/21 1851 09/12/21 2104 09/12/21 2105  BP: 124/83 131/84 139/82 139/82  Pulse: (!) 113 99 (!) 108 (!) 108  Resp: (!) 25 16  17   Temp:  98.9 F (37.2 C) 100.1 F (37.8 C) 100.1 F (37.8 C)  TempSrc:  Oral Oral Oral  SpO2: 98% 100% 100% 100%   Constitutional: NAD, calm, comfortable Eyes: PERRL, lids and conjunctivae normal ENMT: Mucous membranes are moist. Posterior pharynx clear of any exudate or lesions.Normal dentition.  Neck: normal, supple, no masses. Respiratory: clear to auscultation bilaterally, no wheezing, no crackles. Normal respiratory effort. No accessory muscle use.  Cardiovascular: Regular rate and rhythm, no murmurs / rubs / gallops. No extremity edema. 2+ pedal pulses. Abdomen: no tenderness, no masses palpated. No hepatosplenomegaly. Bowel sounds positive.  Musculoskeletal: no clubbing / cyanosis. No joint deformity upper and lower extremities. Good ROM, no contractures. Normal muscle tone.  Skin: Warm, dry Neurologic: CN 2-12 grossly intact. Sensation intact. Strength 5/5 in all 4.  Psychiatric: Normal judgment and insight. Alert and oriented x 3. Normal mood.  EKG: Personally reviewed. Sinus tachycardia, rate 105, PAC.  Rate is faster and PAC new when compared to prior.  Assessment/Plan Principal Problem:   Left breast abscess Active Problems:   Malignant neoplasm of upper-outer quadrant of left breast in female, estrogen receptor positive ([place])   Essential hypertension   Lesion of pancreas   Hypokalemia   Joanna Osborn is a 51 y.o. female with medical history significant for left breast cancer s/p neoadjuvant chemotherapy followed by lumpectomy with seed placement 08/25/2021 now on post adjuvant treatment with trastuzumab (infusion 09/05/2021), hypertension, depression/anxiety who is admitted with left breast fluid collections concerning for abscess.  Assessment and Plan: * Left breast abscess- (present on admission) CT A/P showed 2 fluid collections in the  left breast.  Lateral collection suspicious for abscess, more central collection may represent seroma versus abscess.  Patient did have fever up to 101.2 F and leukocytosis of 15.4 in the ED. -General surgery following -Ultrasound-guided aspiration by IR recommended, order placed -Continue empiric antibiotics with IV vancomycin and ceftriaxone -Follow-up blood cultures  Malignant neoplasm of upper-outer quadrant of left breast in female, estrogen receptor positive ([place]) Follows with oncology, Dr. Burr Medico. S/p neoadjuvant chemotherapy followed by lumpectomy with seed placement 08/25/2021 now on post adjuvant treatment with trastuzumab (last infusion 09/05/2021).  Suspect her GI symptoms, cough, headache may be secondary to immunotherapy.  Essential hypertension- (present on admission) BP stable.  Holding HCTZ with hypokalemia.  Hypokalemia- (present on admission) Supplement orally.  Lesion of pancreas- (present on admission) Mildly hypodense lesion in the uncinate process of the pancreas seen on CT abdomen/pelvis.  Nonemergent MRI abdomen with and without contrast recommended for further evaluation  DVT prophylaxis: enoxaparin (LOVENOX) injection 40 mg Start: 09/13/21 2200 Code Status: Full code Family Communication: Discussed with patient's husband and 2 friends at bedside Disposition Plan: From home and likely discharge to home pending clinical progress Consults called: General surgery, IR Severity of Illness: The appropriate patient status for this patient is INPATIENT. Inpatient status is judged to be reasonable and necessary in order to provide the required intensity of service to ensure the patient's safety. The patient's presenting symptoms, physical exam findings, and initial radiographic and laboratory data in the context of their chronic comorbidities is felt to place them at high risk for further clinical deterioration. Furthermore, it is not anticipated that the patient will be  medically stable for discharge from the hospital within 2 midnights of admission.   * I certify that at the point of admission it is my clinical judgment that the patient will require inpatient hospital care spanning beyond 2 midnights from the point of admission due to high intensity of service, high risk for further deterioration and high frequency of surveillance required.Zada Finders MD [facility]  If 7PM-7AM, please contact night-coverage [email or web address]  09/12/2021, 9:07 PM

## 2021-09-12 NOTE — Assessment & Plan Note (Signed)
Follows with oncology, Dr. Burr Medico. S/p neoadjuvant chemotherapy followed by lumpectomy with seed placement 08/25/2021 now on post adjuvant treatment with trastuzumab (last infusion 09/05/2021).  Suspect her GI symptoms, cough, headache may be secondary to immunotherapy.

## 2021-09-12 NOTE — ED Triage Notes (Signed)
4 day h/o HA, fatigue, diarrhea and abdominal pain. Has been taking tylenol and alka seltzer sinus with some relief. Confirms body aches but Pt attributes symptom from being in bed for the last 3 days. Pt is completing immunotherapy.  Confirms nausea. No emesis.  Pt last seen on 08/04/21 for cough and rhinorrhea sxs still persist.

## 2021-09-12 NOTE — Progress Notes (Signed)
Plan of Care Note for accepted transfer   Patient: Joanna Osborn MRN: [id number]   [place]: 09/12/2021  Facility requesting transfer: [facility] Requesting Provider: [place] Reason for transfer: Concern for sepsis Facility course: Patient with h/o breast cancer s/p lumpectomy and seed placement last month by Dr. Donne Hazel, undergoing immunotherapy.  Febrile at urgent care, breast pain, diarrhea.  WBC 15.4.  Scans indicate abscess.  Surgery recommended breast center aspiration and oral antibiotics.  The PA was not comfortable with this plan and still prefers admission.  The patient does have SIRS criteria but no evidence of organ dysfunction (normal lactate and BP) at this time to suggest sepsis.  As such, I have asked that she call the breast center to discuss the recommended plan.  Were she not to do this, the patient is likely to remain in the ER for a prolonged period of time without opportunity for aspiration.  If the breast center recommends admission, we will do so and plan to consult breast center and/or surgery as needed.  The PA has spoken with the breast center - they are unable to get her in for a week and a week.  IR recommends an US aspiration, Dr. Vernard Gambles is on call all weekend and is willing to drain it this weekend.  Will admit to telemetry for monitoring in the meantime.    Plan of care: The patient is accepted for admission to Telemetry unit, at [facility].   Author: Karmen Bongo, MD 09/12/2021  Check [email or web address] for on-call coverage.  Nursing staff, Please call [place] number on Amion as soon as patient's arrival, so appropriate admitting provider can evaluate the pt.

## 2021-09-12 NOTE — Assessment & Plan Note (Signed)
Mildly hypodense lesion in the uncinate process of the pancreas seen on CT abdomen/pelvis.  Nonemergent MRI abdomen with and without contrast recommended for further evaluation

## 2021-09-12 NOTE — ED Notes (Signed)
One set of BC obtained due to limited IV access

## 2021-09-12 NOTE — Hospital Course (Signed)
Joanna Osborn is a 51 y.o. female with medical history significant for left breast cancer s/p neoadjuvant chemotherapy followed by lumpectomy with seed placement 08/25/2021 now on post adjuvant treatment with trastuzumab (infusion 09/05/2021), hypertension, depression/anxiety who is admitted with left breast fluid collections concerning for abscess.

## 2021-09-12 NOTE — Assessment & Plan Note (Signed)
BP stable.  Holding HCTZ with hypokalemia.

## 2021-09-12 NOTE — Consult Note (Signed)
Joanna SOSA June 09, 1971  [id number].    Requesting MD: Dr. Karmen Bongo Chief Complaint/Reason for Consult: breast abscess, s/p mastectomy  HPI:  This is a 51 yo female with a history of breast cancer s/p neoadjuvant tx and lumpectomy with left deep axillary sentinel lymph node bx by Dr. Donne Hazel 08/25/21.  She was just seen 3 days ago in the office and was doing well with nothing noted that was overtly concerning.  She then began to feel worse with fever and malaise along with a HA and loose stools.  She underwent a work up with a CT C/A/P which revealed fluid collections in her left breast that were suspicious for abscess.  Her WBC is elevated at 15.4.  The recommendation was made for oral abx therapy and follow up at the breast center for aspiration.  She was felt to be too ill by the ED and requested admission for inpatient treatment.  She is being transferred to [facility] for admission with IR consultation for aspiration of these collections.  We have been asked to see her for further evaluation and recommendations.  ROS: Review of Systems  Constitutional:  Positive for chills. Negative for fever and malaise/fatigue.  HENT:  Negative for ear discharge, hearing loss and sore throat.   Eyes:  Negative for blurred vision and discharge.  Respiratory:  Negative for cough and shortness of breath.   Cardiovascular:  Negative for chest pain, orthopnea and leg swelling.  Gastrointestinal:  Negative for abdominal pain, constipation, diarrhea, heartburn, nausea and vomiting.  Musculoskeletal:  Negative for myalgias and neck pain.  Skin:  Negative for itching and rash.  Neurological:  Negative for dizziness, focal weakness, seizures and loss of consciousness.  Endo/Heme/Allergies:  Negative for environmental allergies. Does not bruise/bleed easily.  Psychiatric/Behavioral:  Negative for depression and suicidal ideas.   All other systems reviewed and are negative.: Please see HPI, otherwise  all other systems have been reviewed and are negative.   Family History  Problem Relation Age of Onset   Kidney disease Mother    Cancer Paternal Aunt        unknown type; dx after 4   Colon cancer Paternal Grandmother        dx unknown age   Colon cancer Paternal Grandfather        dx after 72   Hypertension Other    Cancer Other    Cancer Cousin        unknown type; dx before 73    Past Medical History:  Diagnosis Date   Anxiety    Breast cancer ([place])    Depression    Family history of colon cancer 03/19/2021   History of kidney stones    Hypertension     Past Surgical History:  Procedure Laterality Date   BREAST LUMPECTOMY WITH RADIOACTIVE SEED AND SENTINEL LYMPH NODE BIOPSY Left 08/25/2021   Procedure: LEFT BREAST LUMPECTOMY WITH RADIOACTIVE SEED AND AXILLARY SENTINEL LYMPH NODE BIOPSY;  Surgeon: Rolm Bookbinder, MD;  Location: [place];  Service: General;  Laterality: Left;   CHOLECYSTECTOMY  2002   INSERTION OF MESH N/A 10/28/2016   Procedure: INSERTION OF MESH;  Surgeon: Clovis [name], MD;  Location: [place];  Service: General;  Laterality: N/A;   PORTACATH PLACEMENT Right 04/02/2021   Procedure: INSERTION PORT-A-CATH;  Surgeon: Rolm Bookbinder, MD;  Location: [place];  Service: General;  Laterality: Right;   [place]  REPAIR N/A 10/28/2016   Procedure: LAPAROSCOPIC VENTRAL HERNIA REPAIR;  Surgeon: Clovis [name], MD;  Location: [place];  Service: General;  Laterality: N/A;   WRIST SURGERY      Social History:  reports that she has never smoked. She has never used smokeless tobacco. She reports current alcohol use of about 1.0 standard drink per week. She reports that she does not use drugs.  Allergies:  Allergies  Allergen Reactions   Endocet [Oxycodone-Acetaminophen] Itching   No Known Allergies     (Not in a hospital admission)    Physical Exam: Blood pressure 125/87, pulse (!) 102,  temperature 99.7 F (37.6 C), temperature source Rectal, resp. rate 20, SpO2 100 %. General: pleasant, WD, WN female who is laying in bed in NAD HEENT: head is normocephalic, atraumatic.  Sclera are noninjected.  PERRL.  Ears and nose without any masses or lesions.  Mouth is pink and moist Heart: regular, rate, and rhythm.  Normal s1,s2. No obvious murmurs, gallops, or rubs noted.  Palpable radial and pedal pulses bilaterally Lungs: CTAB, no wheezes, rhonchi, or rales noted.  Respiratory effort nonlabored Breast:L breast edema with some erythema.  Incision site appears intact. Abd: soft, NT, ND, +BS, no masses, hernias, or organomegaly MS: all 4 extremities are symmetrical with no cyanosis, clubbing, or edema. Skin: warm and dry with no masses, lesions, or rashes Neuro: Cranial nerves 2-12 grossly intact, sensation is normal throughout Psych: A&Ox3 with an appropriate affect.   Results for orders placed or performed during the hospital encounter of 09/12/21 (from the past 48 hour(s))  CBC with Differential     Status: Abnormal   Collection Time: 09/12/21 10:23 AM  Result Value Ref Range   WBC 15.4 (H) 4.0 - 10.5 K/uL   RBC 3.56 (L) 3.87 - 5.11 MIL/uL   Hemoglobin 11.3 (L) 12.0 - 15.0 g/dL   HCT 34.5 (L) 36.0 - 46.0 %   MCV 96.9 80.0 - 100.0 fL   MCH 31.7 26.0 - 34.0 pg   MCHC 32.8 30.0 - 36.0 g/dL   RDW 15.6 (H) 11.5 - 15.5 %   Platelets 399 150 - 400 K/uL   nRBC 0.0 0.0 - 0.2 %   Neutrophils Relative % 84 %   Neutro Abs 13.0 (H) 1.7 - 7.7 K/uL   Lymphocytes Relative 9 %   Lymphs Abs 1.3 0.7 - 4.0 K/uL   Monocytes Relative 6 %   Monocytes Absolute 0.9 0.1 - 1.0 K/uL   Eosinophils Relative 0 %   Eosinophils Absolute 0.0 0.0 - 0.5 K/uL   Basophils Relative 0 %   Basophils Absolute 0.0 0.0 - 0.1 K/uL   Immature Granulocytes 1 %   Abs Immature Granulocytes 0.14 (H) 0.00 - 0.07 K/uL    Comment: Performed at [facility], [address] [place], [place] [address]   Comprehensive metabolic panel     Status: Abnormal   Collection Time: 09/12/21 10:23 AM  Result Value Ref Range   Sodium 133 (L) 135 - 145 mmol/L   Potassium 3.1 (L) 3.5 - 5.1 mmol/L   Chloride 95 (L) 98 - 111 mmol/L   CO2 23 22 - 32 mmol/L   Glucose, Bld 109 (H) 70 - 99 mg/dL    Comment: Glucose reference range applies only to samples taken after fasting for at least 8 hours.   BUN 20 6 - 20 mg/dL   Creatinine, Ser 0.95 0.44 - 1.00 mg/dL   Calcium 9.3 8.9 -  10.3 mg/dL   Total Protein 8.2 (H) 6.5 - 8.1 g/dL   Albumin 3.7 3.5 - 5.0 g/dL   AST 38 15 - 41 U/L   ALT 43 0 - 44 U/L   Alkaline Phosphatase 62 38 - 126 U/L   Total Bilirubin 1.0 0.3 - 1.2 mg/dL   GFR, Estimated >60 >60 mL/min    Comment: (NOTE) Calculated using the CKD-EPI Creatinine Equation (2021)    Anion gap 15 5 - 15    Comment: Performed at [facility], [address] [place], [place] [address]  Lipase, blood     Status: None   Collection Time: 09/12/21 10:23 AM  Result Value Ref Range   Lipase 21 11 - 51 U/L    Comment: Performed at [facility], [address] [place], [place] [address]  hCG, serum, qualitative     Status: None   Collection Time: 09/12/21 10:23 AM  Result Value Ref Range   Preg, Serum NEGATIVE NEGATIVE    Comment:        THE SENSITIVITY OF THIS METHODOLOGY IS >10 mIU/mL. Performed at [facility], [address] [place], [place] [address]   Resp Panel by RT-PCR (Flu A&B, Covid) Nasopharyngeal Swab     Status: None   Collection Time: 09/12/21 10:23 AM   Specimen: Nasopharyngeal Swab; Nasopharyngeal(NP) swabs in vial transport medium  Result Value Ref Range   SARS Coronavirus 2 by RT PCR NEGATIVE NEGATIVE    Comment: (NOTE) SARS-CoV-2 target nucleic acids are NOT DETECTED.  The SARS-CoV-2 RNA is generally detectable in upper respiratory specimens during the acute phase of infection. The lowest concentration of SARS-CoV-2 viral copies this assay can  detect is 138 copies/mL. A negative result does not preclude SARS-Cov-2 infection and should not be used as the sole basis for treatment or other patient management decisions. A negative result may occur with  improper specimen collection/handling, submission of specimen other than nasopharyngeal swab, presence of viral mutation(s) within the areas targeted by this assay, and inadequate number of viral copies(<138 copies/mL). A negative result must be combined with clinical observations, patient history, and epidemiological information. The expected result is Negative.  Fact Sheet for Patients:  EntrepreneurPulse.com.au  Fact Sheet for Healthcare Providers:  IncredibleEmployment.be  This test is no t yet approved or cleared by the [place] FDA and  has been authorized for detection and/or diagnosis of SARS-CoV-2 by FDA under an Emergency Use Authorization (EUA). This EUA will remain  in effect (meaning this test can be used) for the duration of the COVID-19 declaration under Section 564(b)(1) of the Act, 21 U.S.C.section 360bbb-3(b)(1), unless the authorization is terminated  or revoked sooner.       Influenza A by PCR NEGATIVE NEGATIVE   Influenza B by PCR NEGATIVE NEGATIVE    Comment: (NOTE) The Xpert Xpress SARS-CoV-2/FLU/RSV plus assay is intended as an aid in the diagnosis of influenza from Nasopharyngeal swab specimens and should not be used as a sole basis for treatment. Nasal washings and aspirates are unacceptable for Xpert Xpress SARS-CoV-2/FLU/RSV testing.  Fact Sheet for Patients: EntrepreneurPulse.com.au  Fact Sheet for Healthcare Providers: IncredibleEmployment.be  This test is not yet approved or cleared by the [place] FDA and has been authorized for detection and/or diagnosis of SARS-CoV-2 by FDA under an Emergency Use Authorization (EUA). This EUA will remain in effect  (meaning this test can be used) for the duration of the COVID-19 declaration under  Section 564(b)(1) of the Act, 21 U.S.C. section 360bbb-3(b)(1), unless the authorization is terminated or revoked.  Performed at [facility], [address] [place], [place] [address]   Lactic acid, plasma     Status: None   Collection Time: 09/12/21 10:23 AM  Result Value Ref Range   Lactic Acid, Venous 1.3 0.5 - 1.9 mmol/L    Comment: Performed at [facility], [address] [place], [place] [address]   DG Chest 2 View  Result Date: 09/12/2021 CLINICAL DATA:  51 year old female with cough. EXAM: CHEST - 2 VIEW COMPARISON:  Chest radiographs 08/04/2021. FINDINGS: Stable right chest power port. Lower lung volumes. Mediastinal contours remain within normal limits. Both lungs remain clear. No pneumothorax or pleural effusion. Visualized tracheal air column is within normal limits. No acute osseous abnormality identified. Negative visible bowel gas. IMPRESSION: Lower lung volumes.  No acute cardiopulmonary abnormality. Electronically Signed   By: Genevie Ann M.D.   On: 09/12/2021 11:01   CT Head Wo Contrast  Result Date: 09/12/2021 CLINICAL DATA:  Headache. EXAM: CT HEAD WITHOUT CONTRAST TECHNIQUE: Contiguous axial images were obtained from the base of the skull through the vertex without intravenous contrast. RADIATION DOSE REDUCTION: This exam was performed according to the departmental dose-optimization program which includes automated exposure control, adjustment of the mA and/or kV according to patient size and/or use of iterative reconstruction technique. COMPARISON:  None. FINDINGS: Brain: No evidence of acute infarction, hemorrhage, hydrocephalus, extra-axial collection or mass lesion/mass effect. Vascular: No hyperdense vessel or unexpected calcification. Skull: Normal. Negative for fracture or focal lesion. Sinuses/Orbits: No acute finding. Other: None. IMPRESSION: No acute intracranial  abnormality seen. Electronically Signed   By: Marijo Conception M.D.   On: 09/12/2021 12:35   CT Angio Chest PE W and/or Wo Contrast  Result Date: 09/12/2021 CLINICAL DATA:  Cough, shortness of breath, tachycardia. EXAM: CT ANGIOGRAPHY CHEST WITH CONTRAST TECHNIQUE: Multidetector CT imaging of the chest was performed using the standard protocol during bolus administration of intravenous contrast. Multiplanar CT image reconstructions and MIPs were obtained to evaluate the vascular anatomy. RADIATION DOSE REDUCTION: This exam was performed according to the departmental dose-optimization program which includes automated exposure control, adjustment of the mA and/or kV according to patient size and/or use of iterative reconstruction technique. CONTRAST:  167mL OMNIPAQUE IOHEXOL 350 MG/ML SOLN COMPARISON:  None. FINDINGS: Cardiovascular: Preferential opacification of the thoracic aorta. No evidence of central pulmonary embolism. Evaluation of segmental and subsegmental pulmonary arteries is limited. No evidence of thoracic aortic aneurysm or dissection. Normal heart size. No pericardial effusion. Mediastinum/Nodes: No enlarged mediastinal, hilar, or axillary lymph nodes. Thyroid gland, trachea, and esophagus demonstrate no significant findings. Lungs/Pleura: Lungs are clear. No pleural effusion or pneumothorax. Upper Abdomen: No acute abnormality. Musculoskeletal: There are multiple fluid collections in the left axilla/anterior chest wall in the surgical bed with small focus of gas. The largest collection in the left axilla measures at least 6.8 x 5.4 x 8.6 cm. There is also skin thickening. Review of the MIP images confirms the above findings. IMPRESSION: 1. No evidence of central pulmonary embolism, evaluation of segmental and subsegmental pulmonary arteries is limited due to phase of contrast enhancement. 2. Lungs are clear without evidence of focal consolidation or pleural effusion. 3. Postsurgical changes in the  left axilla/left anterior chest wall with multiple fluid collections, the largest measuring at least 6.8 x 5.4 x 8.6 cm. These likely postsurgical seroma/hematoma. Surgical consultation for further management  would be helpful. Electronically Signed   By: Keane Police D.O.   On: 09/12/2021 12:48   CT ABDOMEN PELVIS W CONTRAST  Addendum Date: 09/12/2021   ADDENDUM REPORT: 09/12/2021 13:07 ADDENDUM: These results were called by telephone at the time of interpretation on 09/12/2021 at 1:07 pm to provider [facility] , who verbally acknowledged these results. Electronically Signed   By: Lorin Picket M.D.   On: 09/12/2021 13:07   Result Date: 09/12/2021 CLINICAL DATA:  Fatigue, diarrhea and abdominal pain. EXAM: CT ABDOMEN AND PELVIS WITH CONTRAST TECHNIQUE: Multidetector CT imaging of the abdomen and pelvis was performed using the standard protocol following bolus administration of intravenous contrast. RADIATION DOSE REDUCTION: This exam was performed according to the departmental dose-optimization program which includes automated exposure control, adjustment of the mA and/or kV according to patient size and/or use of iterative reconstruction technique. CONTRAST:  137mL OMNIPAQUE IOHEXOL 350 MG/ML SOLN COMPARISON:  01/29/2014. FINDINGS: Lower chest: Mild subsegmental atelectasis in the lower lobes. Heart is enlarged. No pericardial or pleural effusion. Distal esophagus is grossly unremarkable. Hepatobiliary: Liver is slightly decreased in attenuation diffusely and is enlarged, measuring 21.8 cm. Cholecystectomy. No biliary ductal dilatation. Pancreas: Mildly hyperdense lesion in the uncinate process of the pancreas measures 1.6 x 1.7 cm, not definitely seen on 01/29/2014. No ductal dilatation or gland atrophy. Spleen: Negative. Adrenals/Urinary Tract: Adrenal glands and kidneys are unremarkable. Ureters are decompressed. Bladder is grossly unremarkable. Stomach/Bowel: Small hiatal hernia. Stomach, small  bowel, appendix and colon are unremarkable. Vascular/Lymphatic: Atherosclerotic calcification of the aorta. No pathologically enlarged lymph nodes. Reproductive: Uterus is enlarged and contains heterogeneous masses, the largest of which measures 6.3 cm and contains calcification. No adnexal mass. Other: No free fluid.  Mesenteries and peritoneum are unremarkable. Musculoskeletal: There are 2 fluid collections in the left breast. The lateral most collection is partially visualized, has a peripherally hyperattenuating margin, measuring at least 5.3 x 8.3 cm. A localization clip is seen along the anteromedial margin. A second fluid collection with a small amount of air is seen centrally within the upper left breast, measuring 6.9 x 8.1 cm. Overlying skin thickening. No worrisome lytic or sclerotic lesions. IMPRESSION: 1. Two fluid collections in the left breast, status post radioactive seed localization 08/22/2021. The lateral collection is suspicious for an abscess. The more central collection may represent a seroma or an abscess. 2. Mildly hypodense lesion in the uncinate process of the pancreas, not definitely seen on 01/29/2014. Non emergent MR abdomen without and with contrast is recommended in further evaluation, as malignancy cannot be excluded. 3. Steatotic enlarged liver. 4. Enlarged fibroid uterus. Electronically Signed: By: Lorin Picket M.D. On: 09/12/2021 12:49      Assessment/Plan Grade 3 intra-ductal carcinoma, s/p neoadjuvant therapy and lumpectomy 08/25/21 by Dr. Donne Hazel with Left breast fluid collections Patient, imaging, vitals, labs, and other H&P/consultant notes reviewed.  The patient appears to have 2 fluid collections that may be abscess in nature, particularly the more last collection.  She is going to be admitted for abx therapy as well as aspiration of these collections by IR this weekend.  No definitive surgical intervention is warranted for this at this time.  We will follow with  you.  She will need to follow up with Dr. Donne Hazel as an outpatient.   FEN - Npo p MN ID - [email or web address]  Moderate Medical Decision Making   [facility] 09/12/2021, 4:08 PM Please see Amion for pager number during day hours 7:00am-4:30pm or 7:00am -11:30am  on weekends

## 2021-09-12 NOTE — ED Provider Notes (Addendum)
[place] EMERGENCY DEPARTMENT Provider Note   CSN: [id number] Arrival date & time: 09/12/21  1093     History  Chief Complaint  Patient presents with   Cough    Joanna Osborn is a 51 y.o. female with breast CA currently undergoing chemo here for evaluation of not feeling well.  Patient states she has had cough and congestion over the last 2 weeks.  Over the last 4 days developed headache which is described as a bandlike sensation across the front of her head.  No vision changes.  No unilateral weakness, numbness.  No neck pain, rigidity.  Headache is described as dull and aching.  Rated 5/10.  She also has had some loose stools and some generalized abdominal cramping.  Her loose stools and abdominal cramping began she started her immunotherapy treatment.  No melena or bright blood per rectum.  No rashes or lesions.  She denies any fever, chest pain, shortness of breath, back pain, numbness.  Does have some diffuse myalgias which she relates to lying in bed over the last few days.  No hemoptysis, history of PE or DVT.  No antipyretics today.  Has not taken her temperature at home. Has been having some increased pain to left breast where prior surgery was by Dr. Donne Hazel. Some redness over the last 2-3 days. Was not there at prior surgery visit 4 days ago however was having pain at that time. No drainage from site.  Seen in urgent care sent over here for fever 101.2>> Documented in EPIC HPI     Home Medications Prior to Admission medications   Medication Sig Start Date End Date Taking? Authorizing Provider  amoxicillin-clavulanate (AUGMENTIN) 875-125 MG tablet Take 1 tablet by mouth every 12 (twelve) hours. 08/04/21   Teodora Medici, FNP  diphenoxylate-atropine (LOMOTIL) 2.5-0.025 MG tablet Take 2 tablets by mouth 4 (four) times daily as needed for diarrhea or loose stools. 05/19/21   Truitt Merle, MD  hydrochlorothiazide (HYDRODIURIL) 25 MG tablet Take 25 mg by mouth daily. 02/26/21    [provider]  HYDROcodone-acetaminophen (NORCO) 10-325 MG tablet Take 1 tablet by mouth every 6 (six) hours as needed. 08/25/21   Rolm Bookbinder, MD  LORazepam (ATIVAN) 1 MG tablet Take 1 tablet (1 mg total) by mouth every 8 (eight) hours as needed (nausea). 06/16/21   Truitt Merle, MD  potassium chloride SA (KLOR-CON M) 20 MEQ tablet Take 1 tablet (20 mEq total) by mouth daily. 07/27/21   Truitt Merle, MD  prochlorperazine (COMPAZINE) 10 MG tablet Take 1 tablet (10 mg total) by mouth every 6 (six) hours as needed (Nausea or vomiting). 03/19/21 09/05/21  Truitt Merle, MD      Allergies    Endocet [oxycodone-acetaminophen] and No known allergies    Review of Systems   Review of Systems  Constitutional:  Positive for activity change and fatigue.  HENT:  Positive for congestion, postnasal drip and rhinorrhea. Negative for drooling, ear discharge, ear pain, facial swelling, sore throat, trouble swallowing and voice change.   Respiratory:  Positive for cough. Negative for choking, chest tightness, shortness of breath, wheezing and stridor.   Cardiovascular: Negative.   Genitourinary: Negative.   Musculoskeletal:  Positive for myalgias.  Skin: Negative.   Neurological:  Positive for headaches. Negative for dizziness, tremors, seizures, syncope, facial asymmetry, speech difficulty, weakness, light-headedness and numbness.  All other systems reviewed and are negative.  Physical Exam Updated Vital Signs BP 116/90 (BP Location: Right Arm)  Pulse (!) 104    Temp 99.7 F (37.6 C) (Rectal)    Resp 14    SpO2 98%  Physical Exam Vitals and nursing note reviewed.  Constitutional:      General: She is not in acute distress.    Appearance: She is well-developed. She is not ill-appearing, toxic-appearing or diaphoretic.  HENT:     Head: Normocephalic and atraumatic.     Right Ear: Tympanic membrane, ear canal and external ear normal. There is no impacted cerumen.     Left Ear: Tympanic membrane,  ear canal and external ear normal. There is no impacted cerumen.     Nose: Congestion and rhinorrhea present.     Mouth/Throat:     Mouth: Mucous membranes are moist.  Eyes:     Pupils: Pupils are equal, round, and reactive to light.  Cardiovascular:     Rate and Rhythm: Normal rate.     Pulses: Normal pulses.     Heart sounds: Normal heart sounds.  Pulmonary:     Effort: Pulmonary effort is normal. No respiratory distress.     Breath sounds: Normal breath sounds.     Comments: Clear bilaterally, speaks in full sentences without difficulty Chest:     Comments: Left breast with erythema, warmth Abdominal:     General: Bowel sounds are normal. There is no distension.     Palpations: Abdomen is soft.     Tenderness: There is generalized abdominal tenderness and tenderness in the epigastric area. There is no right CVA tenderness, left CVA tenderness, guarding or rebound.  Musculoskeletal:        General: No swelling, tenderness, deformity or signs of injury. Normal range of motion.     Cervical back: Normal range of motion.     Right lower leg: No edema.     Left lower leg: No edema.  Skin:    General: Skin is warm and dry.     Capillary Refill: Capillary refill takes less than 2 seconds.  Neurological:     General: No focal deficit present.     Mental Status: She is alert and oriented to person, place, and time.     Cranial Nerves: No cranial nerve deficit.     Sensory: No sensory deficit.     Motor: No weakness.     Gait: Gait normal.     Comments: CN 2-12 grossly intact Equal strength Intact sensation   Psychiatric:        Mood and Affect: Mood normal.    ED Results / Procedures / Treatments   Labs (all labs ordered are listed, but only abnormal results are displayed) Labs Reviewed  CBC WITH DIFFERENTIAL/PLATELET - Abnormal; Notable for the following components:      Result Value   WBC 15.4 (*)    RBC 3.56 (*)    Hemoglobin 11.3 (*)    HCT 34.5 (*)    RDW 15.6 (*)     Neutro Abs 13.0 (*)    Abs Immature Granulocytes 0.14 (*)    All other components within normal limits  COMPREHENSIVE METABOLIC PANEL - Abnormal; Notable for the following components:   Sodium 133 (*)    Potassium 3.1 (*)    Chloride 95 (*)    Glucose, Bld 109 (*)    Total Protein 8.2 (*)    All other components within normal limits  RESP PANEL BY RT-PCR (FLU A&B, COVID) ARPGX2  CULTURE, BLOOD (ROUTINE X 2)  CULTURE, BLOOD (ROUTINE X 2)  LIPASE, BLOOD  HCG, SERUM, QUALITATIVE  LACTIC ACID, PLASMA  LACTIC ACID, PLASMA    EKG EKG Interpretation  Date/Time:  Friday September 12 2021 10:30:12 EST Ventricular Rate:  105 PR Interval:  149 QRS Duration: 87 QT Interval:  329 QTC Calculation: 435 R Axis:   17 Text Interpretation: Sinus tachycardia Atrial premature complexes Left ventricular hypertrophy Borderline T abnormalities, anterior leads Confirmed by Nanda Quinton [phone]) on 09/12/2021 10:39:41 AM  Radiology DG Chest 2 View  Result Date: 09/12/2021 CLINICAL DATA:  51 year old female with cough. EXAM: CHEST - 2 VIEW COMPARISON:  Chest radiographs 08/04/2021. FINDINGS: Stable right chest power port. Lower lung volumes. Mediastinal contours remain within normal limits. Both lungs remain clear. No pneumothorax or pleural effusion. Visualized tracheal air column is within normal limits. No acute osseous abnormality identified. Negative visible bowel gas. IMPRESSION: Lower lung volumes.  No acute cardiopulmonary abnormality. Electronically Signed   By: Genevie Ann M.D.   On: 09/12/2021 11:01   CT Head Wo Contrast  Result Date: 09/12/2021 CLINICAL DATA:  Headache. EXAM: CT HEAD WITHOUT CONTRAST TECHNIQUE: Contiguous axial images were obtained from the base of the skull through the vertex without intravenous contrast. RADIATION DOSE REDUCTION: This exam was performed according to the departmental dose-optimization program which includes automated exposure control, adjustment of the mA and/or  kV according to patient size and/or use of iterative reconstruction technique. COMPARISON:  None. FINDINGS: Brain: No evidence of acute infarction, hemorrhage, hydrocephalus, extra-axial collection or mass lesion/mass effect. Vascular: No hyperdense vessel or unexpected calcification. Skull: Normal. Negative for fracture or focal lesion. Sinuses/Orbits: No acute finding. Other: None. IMPRESSION: No acute intracranial abnormality seen. Electronically Signed   By: Marijo Conception M.D.   On: 09/12/2021 12:35   CT Angio Chest PE W and/or Wo Contrast  Result Date: 09/12/2021 CLINICAL DATA:  Cough, shortness of breath, tachycardia. EXAM: CT ANGIOGRAPHY CHEST WITH CONTRAST TECHNIQUE: Multidetector CT imaging of the chest was performed using the standard protocol during bolus administration of intravenous contrast. Multiplanar CT image reconstructions and MIPs were obtained to evaluate the vascular anatomy. RADIATION DOSE REDUCTION: This exam was performed according to the departmental dose-optimization program which includes automated exposure control, adjustment of the mA and/or kV according to patient size and/or use of iterative reconstruction technique. CONTRAST:  156mL OMNIPAQUE IOHEXOL 350 MG/ML SOLN COMPARISON:  None. FINDINGS: Cardiovascular: Preferential opacification of the thoracic aorta. No evidence of central pulmonary embolism. Evaluation of segmental and subsegmental pulmonary arteries is limited. No evidence of thoracic aortic aneurysm or dissection. Normal heart size. No pericardial effusion. Mediastinum/Nodes: No enlarged mediastinal, hilar, or axillary lymph nodes. Thyroid gland, trachea, and esophagus demonstrate no significant findings. Lungs/Pleura: Lungs are clear. No pleural effusion or pneumothorax. Upper Abdomen: No acute abnormality. Musculoskeletal: There are multiple fluid collections in the left axilla/anterior chest wall in the surgical bed with small focus of gas. The largest collection  in the left axilla measures at least 6.8 x 5.4 x 8.6 cm. There is also skin thickening. Review of the MIP images confirms the above findings. IMPRESSION: 1. No evidence of central pulmonary embolism, evaluation of segmental and subsegmental pulmonary arteries is limited due to phase of contrast enhancement. 2. Lungs are clear without evidence of focal consolidation or pleural effusion. 3. Postsurgical changes in the left axilla/left anterior chest wall with multiple fluid collections, the largest measuring at least 6.8 x 5.4 x 8.6 cm. These likely postsurgical seroma/hematoma. Surgical consultation for further management would be helpful. Electronically Signed  By: Keane Police D.O.   On: 09/12/2021 12:48   CT ABDOMEN PELVIS W CONTRAST  Addendum Date: 09/12/2021   ADDENDUM REPORT: 09/12/2021 13:07 ADDENDUM: These results were called by telephone at the time of interpretation on 09/12/2021 at 1:07 pm to provider [facility] , who verbally acknowledged these results. Electronically Signed   By: Lorin Picket M.D.   On: 09/12/2021 13:07   Result Date: 09/12/2021 CLINICAL DATA:  Fatigue, diarrhea and abdominal pain. EXAM: CT ABDOMEN AND PELVIS WITH CONTRAST TECHNIQUE: Multidetector CT imaging of the abdomen and pelvis was performed using the standard protocol following bolus administration of intravenous contrast. RADIATION DOSE REDUCTION: This exam was performed according to the departmental dose-optimization program which includes automated exposure control, adjustment of the mA and/or kV according to patient size and/or use of iterative reconstruction technique. CONTRAST:  164mL OMNIPAQUE IOHEXOL 350 MG/ML SOLN COMPARISON:  01/29/2014. FINDINGS: Lower chest: Mild subsegmental atelectasis in the lower lobes. Heart is enlarged. No pericardial or pleural effusion. Distal esophagus is grossly unremarkable. Hepatobiliary: Liver is slightly decreased in attenuation diffusely and is enlarged, measuring 21.8 cm.  Cholecystectomy. No biliary ductal dilatation. Pancreas: Mildly hyperdense lesion in the uncinate process of the pancreas measures 1.6 x 1.7 cm, not definitely seen on 01/29/2014. No ductal dilatation or gland atrophy. Spleen: Negative. Adrenals/Urinary Tract: Adrenal glands and kidneys are unremarkable. Ureters are decompressed. Bladder is grossly unremarkable. Stomach/Bowel: Small hiatal hernia. Stomach, small bowel, appendix and colon are unremarkable. Vascular/Lymphatic: Atherosclerotic calcification of the aorta. No pathologically enlarged lymph nodes. Reproductive: Uterus is enlarged and contains heterogeneous masses, the largest of which measures 6.3 cm and contains calcification. No adnexal mass. Other: No free fluid.  Mesenteries and peritoneum are unremarkable. Musculoskeletal: There are 2 fluid collections in the left breast. The lateral most collection is partially visualized, has a peripherally hyperattenuating margin, measuring at least 5.3 x 8.3 cm. A localization clip is seen along the anteromedial margin. A second fluid collection with a small amount of air is seen centrally within the upper left breast, measuring 6.9 x 8.1 cm. Overlying skin thickening. No worrisome lytic or sclerotic lesions. IMPRESSION: 1. Two fluid collections in the left breast, status post radioactive seed localization 08/22/2021. The lateral collection is suspicious for an abscess. The more central collection may represent a seroma or an abscess. 2. Mildly hypodense lesion in the uncinate process of the pancreas, not definitely seen on 01/29/2014. Non emergent MR abdomen without and with contrast is recommended in further evaluation, as malignancy cannot be excluded. 3. Steatotic enlarged liver. 4. Enlarged fibroid uterus. Electronically Signed: By: Lorin Picket M.D. On: 09/12/2021 12:49    Procedures .Critical Care Performed by: Nettie Elm, PA-C Authorized by: Nettie Elm, PA-C   Critical care  provider statement:    Critical care time (minutes):  32   Critical care was necessary to treat or prevent imminent or life-threatening deterioration of the following conditions:  Sepsis   Critical care was time spent personally by me on the following activities:  Development of treatment plan with patient or surrogate, discussions with consultants, evaluation of patient's response to treatment, examination of patient, ordering and review of laboratory studies, ordering and review of radiographic studies, ordering and performing treatments and interventions, pulse oximetry, re-evaluation of patient's condition and review of old charts    Medications Ordered in ED Medications  lactated ringers infusion (has no administration in time range)  vancomycin (VANCOCIN) IVPB 1000 mg/200 mL premix (1,000 mg Intravenous New Bag/Given  09/12/21 1503)  vancomycin (VANCOCIN) IVPB 1000 mg/200 mL premix (has no administration in time range)  vancomycin (VANCOREADY) IVPB 1500 mg/300 mL (has no administration in time range)  sodium chloride 0.9 % bolus 1,000 mL (0 mLs Intravenous Stopped 09/12/21 1154)  prochlorperazine (COMPAZINE) injection 10 mg (10 mg Intravenous Given 09/12/21 1037)  diphenhydrAMINE (BENADRYL) injection 12.5 mg (12.5 mg Intravenous Given 09/12/21 1037)  iohexol (OMNIPAQUE) 350 MG/ML injection 100 mL (100 mLs Intravenous Contrast Given 09/12/21 1218)  sodium chloride 0.9 % bolus 1,000 mL (1,000 mLs Intravenous New Bag/Given 09/12/21 1416)  cefTRIAXone (ROCEPHIN) 2 g in sodium chloride 0.9 % 100 mL IVPB (0 g Intravenous Stopped 09/12/21 1502)   ED Course/ Medical Decision Making/ A&P    51 year old history of breast cancer currently undergoing chemotherapy here for evaluation of feeling unwell.  Patient with headache, congestion, rhinorrhea, cough, generalized abdominal pain and loose stool.  On arrival patient is afebrile, nonseptic appearing.  No antipyretic use today.  She does have a nonfocal  neuro exam without deficits.  Her heart and lungs are clear.  Her abdomen is soft, nontender.  She has no urinary complaints.  No rashes or lesions.  She has no neck stiffness or neck rigidity.  I have low suspicion for meningitis.  Given currently undergoing chemotherapy we will plan on getting labs, imaging, COVID test and reassess we will treat her headache in the meantime. She does endorse some increased pain and redness to left breast where recent lumpectomy performed. Hold on code sepsis here given currently afebrile, question viral illness as cause of sx?  Labs and imaging personally viewed and interpreted:  Preg neg CBC with leukocytosis at 15.4 Lactic 1.3 DG chest without acute abnormality Lipase 21 CMP sodium 133, potassium 3.1 CT head without significant abnormality CT abdomen and pelvis lesion at pancreas, not on prior, will need outpatient MR imaging CTA chest without PE does show possible abscess to left breast  Patient reassessed.  Pain controlled.  Headache resolved.  She does feel warm tot ouch.  We will plan on getting rectal temp.  Given possible abscess left breast with no other source of infection will discuss with general surgery. She does appear to have erythema, warmth with significant tenderness to the left lateral aspect of her breast consistent with at least cellulitis.  CONSULT with Dr. Johney Maine with gen surgery.  States typically these breast abscesses are followed outpatient by the breast center for ultrasound-guided drainage with outpatient antibiotics.  If we feel patient needs admission for IV antibiotics then he would recommend admission over at [facility] and he will make PA over there aware of patient however typically surgery does not perform this procedures. See his note in EPIC  Given febrile at urgent care, tachycardic, leukocytosis here, meets SIRS criteria she was started on IV antibiotics.  Did not get 30/cc/kg bolus due to to MAP greater than 65, lactic acid  less than 4. Will discuss with medicine for admission.  Discussed plan with patient, family in room.  They are agreeable.  CONSULT with Dr. Lorin Mercy with [facility]. She does recommends discussing with breast center/ IR to see if they recommend admission vs outpatient FU at breast center.  CONSULT with Breast center. Cannot get patient in for US drainage until after next week  CONSULT with Dr. Vernard Gambles with IR.  While typically these patients are seen at the breast center he is working today as well as this weekend and can perform ultrasound-guided aspiration. Requests I place this  orders which I have.  CONSULT with Dr. Lorin Mercy, who agrees to accept patient in transfer for admission  Attending, Dr. Laverta Baltimore has seen and evaluated patient.  Agrees with above admission, antibiotics  ADDEND: Patient febrile. 101.0. Tylenol ordered                            Medical Decision Making Amount and/or Complexity of Data Reviewed Independent Historian: friend External Data Reviewed: labs, radiology, ECG and notes. Labs: ordered. Decision-making details documented in ED Course. Radiology: ordered and independent interpretation performed. Decision-making details documented in ED Course. ECG/medicine tests: ordered and independent interpretation performed. Decision-making details documented in ED Course.  Risk OTC drugs. Prescription drug management. Parenteral controlled substances. Decision regarding hospitalization.      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)  Breast abscess  Abscess    Rx / DC Orders ED Discharge Orders     None           Cammie Faulstich A, PA-C 09/12/21 1511    Dayvian Blixt A, PA-C 09/12/21 1645    Long, Wonda Old[facility], MD 09/13/21 1001

## 2021-09-12 NOTE — ED Triage Notes (Signed)
4 day hx of having a HA, was sent from urgent care due recent rec chemotherapy

## 2021-09-12 NOTE — ED Provider Notes (Signed)
Patient presents today for evaluation of fever and headache-- given current immunotherapy treatment for breast cancer recommended further evaluation in the ED for stat labs. Patient is agreeable to same.    Francene Finders, PA-C 09/12/21 [phone]

## 2021-09-12 NOTE — Assessment & Plan Note (Signed)
Supplement orally. 

## 2021-09-12 NOTE — Progress Notes (Signed)
Pharmacy Antibiotic Note  Joanna Osborn is a 51 y.o. female admitted on 09/12/2021 with sepsis and cellulitis. Pharmacy has been consulted for vancomycin dosing. Pt is febrile with Tmax 101.2 and WBC is elevated at 15.4. Scr and lactic acid are WNL.   Plan: Vancomycin 2gm IV x 1 then 1500mg  IV Q24H F/u renal fxn, C&S, clinical status and peak/trough at [facility] F/u ongoing gram neg coverage    Temp (24hrs), Avg:99.9 F (37.7 C), Min:98.8 F (37.1 C), Max:101.2 F (38.4 C)  Recent Labs  Lab 09/12/21 1023  WBC 15.4*  CREATININE 0.95  LATICACIDVEN 1.3    Estimated Creatinine Clearance: 82.1 mL/min (by C-G formula based on SCr of 0.95 mg/dL).    Allergies  Allergen Reactions   Endocet [Oxycodone-Acetaminophen] Itching   No Known Allergies     Antimicrobials this admission: Vanc 2/17>> CTX x 1 2/17  Dose adjustments this admission: N/A  Microbiology results: Pending  Thank you for allowing pharmacy to be a part of this patients care.  Sueko Dimichele, Rande Lawman 09/12/2021 1:48 PM

## 2021-09-12 NOTE — ED Notes (Addendum)
[facility] at bedside. Pt stable for transport. Receiving RN updated.

## 2021-09-12 NOTE — Progress Notes (Addendum)
Called by [facility].  Sickly patient with breast cancer status post neoadjuvant treatment who underwent lumpectomy by Dr. Donne Hazel last month.  Seen 3 days ago by Dr. Donne Hazel in the office and she was doing rather well.  Postop breast fluid collections expected but nothing suspicious for abscess or infection. 108-month follow-up recommended.  Seen by medical oncology and starting post adjuvant treatment trastuzumab infusion 2/13.  Apparently patient was feeling worse and went to the [place] emergency department with complaints of headache and loose bowel movements.  Underwent CAT of chest/abdomen/pelvis.  Scan that showed fluid collections in left breast somewhat spacious for abscess.  PA concerned.  I recommended sending the patient to the breast center for needle aspiration which is the protocol for breast fluid collections per Dr. Pola Corn and send with oral antibiotics such as doxycycline x7 days.  Dr. Donne Hazel post-call & not immediately available but I was able to reach with him and he recommended this plan.  PA concerned that the patient seems deconditioned.  Should the patient need to be admitted, most likely better served to be at [facility] where breast cancer patients are managed and it is easier for the breast center to help manage and aspirate any concerning fluid collections.  Hard to tease out what is going on given her numerous complaints of headache, cough, diarrhea, abdominal pain, breast soreness.  Surgery can help consult and help guide care but would be hesitant doing incision and drainage of breast collections at this time.  Usually needle aspiration and antibiotics are sufficient.  Consider reaching out to medical oncology to make sure that this is not a side effect of the post adjuvant chemotherapy and may put her at risk for other issues.  No definite neutropenia or pancytopenia.

## 2021-09-12 NOTE — ED Notes (Signed)
Updated pt on plan of care. Informed [facility] will arrive soon to transport pt to [facility]. All questions/concerns addressed at this time.

## 2021-09-13 ENCOUNTER — Inpatient Hospital Stay (HOSPITAL_COMMUNITY): Payer: Federal, State, Local not specified - PPO

## 2021-09-13 DIAGNOSIS — N6489 Other specified disorders of breast: Secondary | ICD-10-CM | POA: Diagnosis not present

## 2021-09-13 DIAGNOSIS — R928 Other abnormal and inconclusive findings on diagnostic imaging of breast: Secondary | ICD-10-CM | POA: Diagnosis not present

## 2021-09-13 DIAGNOSIS — N611 Abscess of the breast and nipple: Secondary | ICD-10-CM | POA: Diagnosis not present

## 2021-09-13 DIAGNOSIS — R2232 Localized swelling, mass and lump, left upper limb: Secondary | ICD-10-CM | POA: Diagnosis not present

## 2021-09-13 LAB — BASIC METABOLIC PANEL
Anion gap: 13 (ref 5–15)
BUN: 8 mg/dL (ref 6–20)
CO2: 18 mmol/L — ABNORMAL LOW (ref 22–32)
Calcium: 8.6 mg/dL — ABNORMAL LOW (ref 8.9–10.3)
Chloride: 104 mmol/L (ref 98–111)
Creatinine, Ser: 0.83 mg/dL (ref 0.44–1.00)
GFR, Estimated: >60 mL/min (ref >60)
Glucose, Bld: 181 mg/dL — ABNORMAL HIGH (ref 70–99)
Potassium: 3.4 mmol/L — ABNORMAL LOW (ref 3.5–5.1)
Sodium: 135 mmol/L (ref 135–145)

## 2021-09-13 LAB — CBC
HCT: 28.8 % — ABNORMAL LOW (ref 36.0–46.0)
Hemoglobin: 9.2 g/dL — ABNORMAL LOW (ref 12.0–15.0)
MCH: 31.4 pg (ref 26.0–34.0)
MCHC: 31.9 g/dL (ref 30.0–36.0)
MCV: 98.3 fL (ref 80.0–100.0)
Platelets: 442 10*3/uL — ABNORMAL HIGH (ref 150–400)
RBC: 2.93 MIL/uL — ABNORMAL LOW (ref 3.87–5.11)
RDW: 15.9 % — ABNORMAL HIGH (ref 11.5–15.5)
WBC: 12.7 10*3/uL — ABNORMAL HIGH (ref 4.0–10.5)
nRBC: 0.2 % (ref 0.0–0.2)

## 2021-09-13 LAB — HIV ANTIBODY (ROUTINE TESTING W REFLEX): HIV Screen 4th Generation wRfx: NONREACTIVE

## 2021-09-13 LAB — HEMOGLOBIN A1C
Hgb A1c MFr Bld: 4.8 % (ref 4.8–5.6)
Mean Plasma Glucose: 91.06 mg/dL

## 2021-09-13 LAB — MRSA NEXT GEN BY PCR, NASAL: MRSA by PCR Next Gen: NOT DETECTED

## 2021-09-13 LAB — GLUCOSE, CAPILLARY
Glucose-Capillary: 93 mg/dL (ref 70–99)
Glucose-Capillary: 124 mg/dL — ABNORMAL HIGH (ref 70–99)

## 2021-09-13 MED ORDER — INSULIN ASPART 100 UNIT/ML IJ SOLN: 0.0000 [IU] | Freq: Three times a day (TID) | INTRAMUSCULAR | Status: DC

## 2021-09-13 MED ORDER — BENZONATATE 100 MG PO CAPS
200.0000 mg | ORAL_CAPSULE | Freq: Three times a day (TID) | ORAL | Status: DC
  Administered 2021-09-13 – 2021-09-14 (×3): 200 mg via ORAL
  Filled 2021-09-13 (×3): qty 2

## 2021-09-13 MED ORDER — INSULIN ASPART 100 UNIT/ML IJ SOLN: 0.0000 [IU] | Freq: Every day | INTRAMUSCULAR | Status: DC

## 2021-09-13 MED ORDER — SODIUM CHLORIDE 0.9% FLUSH
10.0000 mL | Freq: Two times a day (BID) | INTRAVENOUS | Status: DC
  Administered 2021-09-13: 10 mL

## 2021-09-13 MED ORDER — LIDOCAINE HCL (PF) 1 % IJ SOLN
INTRAMUSCULAR | Status: AC
  Filled 2021-09-13: qty 30

## 2021-09-13 MED ORDER — CHLORHEXIDINE GLUCONATE CLOTH 2 % EX PADS
6.0000 | MEDICATED_PAD | Freq: Every day | CUTANEOUS | Status: DC
  Administered 2021-09-13 – 2021-09-14 (×2): 6 via TOPICAL

## 2021-09-13 MED ORDER — SODIUM CHLORIDE 0.9% FLUSH: 10.0000 mL | INTRAVENOUS | Status: DC | PRN

## 2021-09-13 MED ORDER — LACTATED RINGERS IV SOLN: INTRAVENOUS | Status: DC

## 2021-09-13 MED ORDER — METOCLOPRAMIDE HCL 5 MG/ML IJ SOLN
5.0000 mg | INTRAMUSCULAR | Status: AC
Start: 2021-09-13 — End: 2021-09-13
  Administered 2021-09-13: 5 mg via INTRAVENOUS
  Filled 2021-09-13: qty 2

## 2021-09-13 MED ORDER — KETOROLAC TROMETHAMINE 15 MG/ML IJ SOLN
15.0000 mg | INTRAMUSCULAR | Status: AC
  Administered 2021-09-13: 15 mg via INTRAVENOUS
  Filled 2021-09-13: qty 1

## 2021-09-13 MED ORDER — DIPHENHYDRAMINE HCL 50 MG/ML IJ SOLN
12.5000 mg | INTRAMUSCULAR | Status: AC
Start: 2021-09-13 — End: 2021-09-13
  Administered 2021-09-13: 12.5 mg via INTRAVENOUS
  Filled 2021-09-13: qty 1

## 2021-09-13 MED ORDER — ALBUTEROL SULFATE (2.5 MG/3ML) 0.083% IN NEBU
2.5000 mg | INHALATION_SOLUTION | Freq: Four times a day (QID) | RESPIRATORY_TRACT | Status: DC
  Administered 2021-09-13 – 2021-09-14 (×4): 2.5 mg via RESPIRATORY_TRACT
  Filled 2021-09-13 (×6): qty 3

## 2021-09-13 NOTE — Progress Notes (Addendum)
PROGRESS NOTE  Joanna Osborn MVH:[id number] DOB: 23-Jan-1971 DOA: 09/12/2021 PCP: Lennie Odor, PA  HPI/Recap of past 24 hours: Joanna Osborn is a 51 y.o. female with medical history significant for left breast cancer s/p neoadjuvant chemotherapy followed by lumpectomy with seed placement 08/25/2021 now on post adjuvant treatment with trastuzumab (infusion 09/05/2021), hypertension, depression/anxiety who presented to the ED for evaluation of cough, headache, diarrhea.   Patient states on 09/09/2021 she developed headache, nonproductive cough, abdominal pain, and diarrhea.  She noticed some increased swelling and redness at her left breast.  She did see her surgeon, Dr. Donne Hazel, and follow-up the same day.  Patient states that area was attempted to be aspirated without any fluid obtained.  Since then she has developed chills with diaphoresis.  She has had continued cough and diarrhea with less abdominal pain and improving headache.  Work-up revealed left breast abscesses for which general surgery and IR were consulted.  Status post ultrasound-guided aspiration of left axillary fluid collection and left anterior breast collections.  200 cc of fluid removed, sent for culture.  09/13/21: Seen and examined at bedside.  Endorses tenderness with palpation of her left breast.   Assessment/Plan: Principal Problem:   Left breast abscess Active Problems:   Malignant neoplasm of upper-outer quadrant of left breast in female, estrogen receptor positive (HCC)   Essential hypertension   Lesion of pancreas   Hypokalemia  Left breast abscesses- (present on admission) CT A/P showed 2 fluid collections in the left breast.  Lateral collection suspicious for abscess, more central collection may represent seroma versus abscess.  Patient did have fever up to 101.2 F and leukocytosis of 15.4 in the ED. -General surgery and interventional radiology following -Ultrasound-guided aspiration by IR on  09/13/2021. -Continue empiric antibiotics with IV vancomycin and ceftriaxone -Follow-up blood cultures, body fluid cultures for ID and sensitivities. Obtain MRSA screening test.   Malignant neoplasm of upper-outer quadrant of left breast in female, estrogen receptor positive ([place]) Follows with oncology, Dr. Burr Medico. S/p neoadjuvant chemotherapy followed by lumpectomy with seed placement 08/25/2021 now on post adjuvant treatment with trastuzumab (last infusion 09/05/2021).  Suspect her GI symptoms, cough, headache may be secondary to immunotherapy.   Essential hypertension- (present on admission) BP stable.  Holding HCTZ with hypokalemia.   Refractory hypokalemia- (present on admission) Potassium 3.4, repleted orally. Repeat BMP in the morning.  Mild anion gap metabolic acidosis Serum bicarb 18, anion gap 13 Start gentle IV fluid hydration LR 50 cc/h x 2 days. Repeat BMP in the morning  Hyperglycemia, no history of diabetes Obtain hemoglobin A1c Serum glucose 181 this morning Start insulin sliding scale   Lesion of pancreas- (present on admission) Mildly hypodense lesion in the uncinate process of the pancreas seen on CT abdomen/pelvis.  Nonemergent MRI abdomen with and without contrast recommended for further evaluation  Hepatomegaly, incidental findings LFTs normal Monitor outpatient.   DVT prophylaxis: enoxaparin (LOVENOX) injection 40 mg Start: 09/13/21 2200 Code Status: Full code Family Communication: None at bedside.   Disposition Plan: From home and likely discharge to home pending clinical progress Consults called: General surgery, IR      Status is: Inpatient Patient requires at least 2 midnights for further evaluation and treatment of present condition.            Objective: Vitals:   09/12/21 2105 09/13/21 0559 09/13/21 0800 09/13/21 0914  BP: 139/82 123/70 110/74 123/81  Pulse: (!) 108 (!) 115 (!) 107 (!) 104  Resp: 17 17  18 18  Temp: 100.1 F (37.8  C) (!) 100.4 F (38 C) 98.4 F (36.9 C) 98.7 F (37.1 C)  TempSrc: Oral Oral Oral Oral  SpO2: 100% 97% 97% 100%    Intake/Output Summary (Last 24 hours) at 09/13/2021 1215 Last data filed at 09/12/2021 2200 Gross per 24 hour  Intake 3603 ml  Output --  Net 3603 ml   There were no vitals filed for this visit.  Exam:  General: 51 y.o. year-old female well developed well nourished in no acute distress.  Alert and oriented x3. Cardiovascular: Regular rate and rhythm with no rubs or gallops.  No thyromegaly or JVD noted.   Respiratory: Clear to auscultation with no wheezes or rales. Good inspiratory effort. Abdomen: Soft nontender nondistended with normal bowel sounds x4 quadrants. Musculoskeletal: No lower extremity edema. 2/4 pulses in all 4 extremities. Skin: Left lateral breast is indurated and very tender with minimal palpation. Psychiatry: Mood is appropriate for condition and setting   Data Reviewed: CBC: Recent Labs  Lab 09/12/21 1023 09/13/21 0123  WBC 15.4* 12.7*  NEUTROABS 13.0*  --   HGB 11.3* 9.2*  HCT 34.5* 28.8*  MCV 96.9 98.3  PLT 399 093*   Basic Metabolic Panel: Recent Labs  Lab 09/12/21 1023 09/12/21 1849 09/13/21 0123  NA 133*  --  135  K 3.1*  --  3.4*  CL 95*  --  104  CO2 23  --  18*  GLUCOSE 109*  --  181*  BUN 20  --  8  CREATININE 0.95  --  0.83  CALCIUM 9.3  --  8.6*  MG  --  1.8  --    GFR: Estimated Creatinine Clearance: 94 mL/min (by C-G formula based on SCr of 0.83 mg/dL). Liver Function Tests: Recent Labs  Lab 09/12/21 1023  AST 38  ALT 43  ALKPHOS 62  BILITOT 1.0  PROT 8.2*  ALBUMIN 3.7   Recent Labs  Lab 09/12/21 1023  LIPASE 21   No results for input(s): AMMONIA in the last 168 hours. Coagulation Profile: No results for input(s): INR, PROTIME in the last 168 hours. Cardiac Enzymes: No results for input(s): CKTOTAL, CKMB, CKMBINDEX, TROPONINI in the last 168 hours. BNP (last 3 results) No results for  input(s): PROBNP in the last 8760 hours. HbA1C: No results for input(s): HGBA1C in the last 72 hours. CBG: No results for input(s): GLUCAP in the last 168 hours. Lipid Profile: No results for input(s): CHOL, HDL, LDLCALC, TRIG, CHOLHDL, LDLDIRECT in the last 72 hours. Thyroid Function Tests: No results for input(s): TSH, T4TOTAL, FREET4, T3FREE, THYROIDAB in the last 72 hours. Anemia Panel: No results for input(s): VITAMINB12, FOLATE, FERRITIN, TIBC, IRON, RETICCTPCT in the last 72 hours. Urine analysis:    Component Value Date/Time   COLORURINE YELLOW 04/12/2021 [place] 04/12/2021 1252   LABSPEC 1.015 04/12/2021 1252   PHURINE 5.0 04/12/2021 1252   GLUCOSEU NEGATIVE 04/12/2021 1252   HGBUR SMALL (A) 04/12/2021 1252   BILIRUBINUR NEGATIVE 04/12/2021 1252   KETONESUR 20 (A) 04/12/2021 1252   PROTEINUR NEGATIVE 04/12/2021 1252   UROBILINOGEN 0.2 02/02/2014 1159   NITRITE NEGATIVE 04/12/2021 1252   LEUKOCYTESUR NEGATIVE 04/12/2021 1252   Sepsis Labs: @LABRCNTIP (procalcitonin:4,lacticidven:4)  ) Recent Results (from the past 240 hour(s))  Blood culture (routine x 2)     Status: None (Preliminary result)   Collection Time: 09/12/21 10:22 AM   Specimen: Right Antecubital; Blood  Result Value Ref Range Status  Specimen Description   Final    RIGHT ANTECUBITAL BLOOD Performed at [facility], [address] [place], [place] [address]    Special Requests   Final    Blood Culture adequate volume BOTTLES DRAWN AEROBIC AND ANAEROBIC Performed at [facility], [address] [place], [place] [address]    Culture   Final    NO GROWTH < 24 HOURS Performed at [facility], [place] [address], [place], [place] [address]    Report Status PENDING  Incomplete  Resp Panel by RT-PCR (Flu A&B, Covid) Nasopharyngeal Swab     Status: None   Collection Time: 09/12/21 10:23 AM   Specimen: Nasopharyngeal Swab; Nasopharyngeal(NP) swabs in vial  transport medium  Result Value Ref Range Status   SARS Coronavirus 2 by RT PCR NEGATIVE NEGATIVE Final    Comment: (NOTE) SARS-CoV-2 target nucleic acids are NOT DETECTED.  The SARS-CoV-2 RNA is generally detectable in upper respiratory specimens during the acute phase of infection. The lowest concentration of SARS-CoV-2 viral copies this assay can detect is 138 copies/mL. A negative result does not preclude SARS-Cov-2 infection and should not be used as the sole basis for treatment or other patient management decisions. A negative result may occur with  improper specimen collection/handling, submission of specimen other than nasopharyngeal swab, presence of viral mutation(s) within the areas targeted by this assay, and inadequate number of viral copies(<138 copies/mL). A negative result must be combined with clinical observations, patient history, and epidemiological information. The expected result is Negative.  Fact Sheet for Patients:  EntrepreneurPulse.com.au  Fact Sheet for Healthcare Providers:  IncredibleEmployment.be  This test is no t yet approved or cleared by the [place] FDA and  has been authorized for detection and/or diagnosis of SARS-CoV-2 by FDA under an Emergency Use Authorization (EUA). This EUA will remain  in effect (meaning this test can be used) for the duration of the COVID-19 declaration under Section 564(b)(1) of the Act, 21 U.S.C.section 360bbb-3(b)(1), unless the authorization is terminated  or revoked sooner.       Influenza A by PCR NEGATIVE NEGATIVE Final   Influenza B by PCR NEGATIVE NEGATIVE Final    Comment: (NOTE) The Xpert Xpress SARS-CoV-2/FLU/RSV plus assay is intended as an aid in the diagnosis of influenza from Nasopharyngeal swab specimens and should not be used as a sole basis for treatment. Nasal washings and aspirates are unacceptable for Xpert Xpress SARS-CoV-2/FLU/RSV testing.  Fact  Sheet for Patients: EntrepreneurPulse.com.au  Fact Sheet for Healthcare Providers: IncredibleEmployment.be  This test is not yet approved or cleared by the [place] FDA and has been authorized for detection and/or diagnosis of SARS-CoV-2 by FDA under an Emergency Use Authorization (EUA). This EUA will remain in effect (meaning this test can be used) for the duration of the COVID-19 declaration under Section 564(b)(1) of the Act, 21 U.S.C. section 360bbb-3(b)(1), unless the authorization is terminated or revoked.  Performed at [facility], [address] [place], [place] [address]   Blood culture (routine x 2)     Status: None (Preliminary result)   Collection Time: 09/12/21  6:38 PM   Specimen: BLOOD RIGHT HAND  Result Value Ref Range Status   Specimen Description BLOOD RIGHT HAND  Final   Special Requests   Final    BOTTLES DRAWN AEROBIC ONLY Blood Culture adequate volume   Culture   Final    NO GROWTH < 12 HOURS Performed at [facility], [place] [address] [place], [place] [address]    Report Status PENDING  Incomplete      Studies: CT Head Wo Contrast  Result Date: 09/12/2021 CLINICAL DATA:  Headache. EXAM: CT HEAD WITHOUT CONTRAST TECHNIQUE: Contiguous axial images were obtained from the base of the skull through the vertex without intravenous contrast. RADIATION DOSE REDUCTION: This exam was performed according to the departmental dose-optimization program which includes automated exposure control, adjustment of the mA and/or kV according to patient size and/or use of iterative reconstruction technique. COMPARISON:  None. FINDINGS: Brain: No evidence of acute infarction, hemorrhage, hydrocephalus, extra-axial collection or mass lesion/mass effect. Vascular: No hyperdense vessel or unexpected calcification. Skull: Normal. Negative for fracture or focal lesion. Sinuses/Orbits: No acute finding. Other: None. IMPRESSION: No  acute intracranial abnormality seen. Electronically Signed   By: Marijo Conception M.D.   On: 09/12/2021 12:35   CT Angio Chest PE W and/or Wo Contrast  Result Date: 09/12/2021 CLINICAL DATA:  Cough, shortness of breath, tachycardia. EXAM: CT ANGIOGRAPHY CHEST WITH CONTRAST TECHNIQUE: Multidetector CT imaging of the chest was performed using the standard protocol during bolus administration of intravenous contrast. Multiplanar CT image reconstructions and MIPs were obtained to evaluate the vascular anatomy. RADIATION DOSE REDUCTION: This exam was performed according to the departmental dose-optimization program which includes automated exposure control, adjustment of the mA and/or kV according to patient size and/or use of iterative reconstruction technique. CONTRAST:  126mL OMNIPAQUE IOHEXOL 350 MG/ML SOLN COMPARISON:  None. FINDINGS: Cardiovascular: Preferential opacification of the thoracic aorta. No evidence of central pulmonary embolism. Evaluation of segmental and subsegmental pulmonary arteries is limited. No evidence of thoracic aortic aneurysm or dissection. Normal heart size. No pericardial effusion. Mediastinum/Nodes: No enlarged mediastinal, hilar, or axillary lymph nodes. Thyroid gland, trachea, and esophagus demonstrate no significant findings. Lungs/Pleura: Lungs are clear. No pleural effusion or pneumothorax. Upper Abdomen: No acute abnormality. Musculoskeletal: There are multiple fluid collections in the left axilla/anterior chest wall in the surgical bed with small focus of gas. The largest collection in the left axilla measures at least 6.8 x 5.4 x 8.6 cm. There is also skin thickening. Review of the MIP images confirms the above findings. IMPRESSION: 1. No evidence of central pulmonary embolism, evaluation of segmental and subsegmental pulmonary arteries is limited due to phase of contrast enhancement. 2. Lungs are clear without evidence of focal consolidation or pleural effusion. 3.  Postsurgical changes in the left axilla/left anterior chest wall with multiple fluid collections, the largest measuring at least 6.8 x 5.4 x 8.6 cm. These likely postsurgical seroma/hematoma. Surgical consultation for further management would be helpful. Electronically Signed   By: Keane Police D.O.   On: 09/12/2021 12:48   CT ABDOMEN PELVIS W CONTRAST  Addendum Date: 09/12/2021   ADDENDUM REPORT: 09/12/2021 13:07 ADDENDUM: These results were called by telephone at the time of interpretation on 09/12/2021 at 1:07 pm to provider [facility] , who verbally acknowledged these results. Electronically Signed   By: Lorin Picket M.D.   On: 09/12/2021 13:07   Result Date: 09/12/2021 CLINICAL DATA:  Fatigue, diarrhea and abdominal pain. EXAM: CT ABDOMEN AND PELVIS WITH CONTRAST TECHNIQUE: Multidetector CT imaging of the abdomen and pelvis was performed using the standard protocol following bolus administration of intravenous contrast. RADIATION DOSE REDUCTION: This exam was performed according to the departmental dose-optimization program which includes automated exposure control, adjustment of the mA and/or kV according to patient size and/or use of iterative reconstruction technique. CONTRAST:  151mL OMNIPAQUE IOHEXOL 350 MG/ML SOLN COMPARISON:  01/29/2014. FINDINGS: Lower chest: Mild subsegmental atelectasis in the lower lobes. Heart is enlarged. No pericardial or pleural effusion. Distal esophagus is grossly unremarkable. Hepatobiliary: Liver is slightly decreased in attenuation diffusely and is enlarged, measuring 21.8 cm. Cholecystectomy. No biliary ductal dilatation. Pancreas: Mildly hyperdense lesion in the uncinate process of the pancreas measures 1.6 x 1.7 cm, not definitely seen on 01/29/2014. No ductal dilatation or gland atrophy. Spleen: Negative. Adrenals/Urinary Tract: Adrenal glands and kidneys are unremarkable. Ureters are decompressed. Bladder is grossly unremarkable. Stomach/Bowel: Small hiatal  hernia. Stomach, small bowel, appendix and colon are unremarkable. Vascular/Lymphatic: Atherosclerotic calcification of the aorta. No pathologically enlarged lymph nodes. Reproductive: Uterus is enlarged and contains heterogeneous masses, the largest of which measures 6.3 cm and contains calcification. No adnexal mass. Other: No free fluid.  Mesenteries and peritoneum are unremarkable. Musculoskeletal: There are 2 fluid collections in the left breast. The lateral most collection is partially visualized, has a peripherally hyperattenuating margin, measuring at least 5.3 x 8.3 cm. A localization clip is seen along the anteromedial margin. A second fluid collection with a small amount of air is seen centrally within the upper left breast, measuring 6.9 x 8.1 cm. Overlying skin thickening. No worrisome lytic or sclerotic lesions. IMPRESSION: 1. Two fluid collections in the left breast, status post radioactive seed localization 08/22/2021. The lateral collection is suspicious for an abscess. The more central collection may represent a seroma or an abscess. 2. Mildly hypodense lesion in the uncinate process of the pancreas, not definitely seen on 01/29/2014. Non emergent MR abdomen without and with contrast is recommended in further evaluation, as malignancy cannot be excluded. 3. Steatotic enlarged liver. 4. Enlarged fibroid uterus. Electronically Signed: By: Lorin Picket M.D. On: 09/12/2021 12:49    Scheduled Meds:  albuterol  2.5 mg Nebulization Q6H   benzonatate  200 mg Oral TID   Chlorhexidine Gluconate Cloth  6 each Topical Daily   enoxaparin (LOVENOX) injection  40 mg Subcutaneous Q24H   sodium chloride flush  10-40 mL Intracatheter Q12H    Continuous Infusions:  cefTRIAXone (ROCEPHIN)  IV     lactated ringers     vancomycin       LOS: 1 day     Kayleen Memos, MD [facility] Pager [phone]  If 7PM-7AM, please contact night-coverage [email or web address] Password [facility] 09/13/2021,  12:15 PM

## 2021-09-13 NOTE — Progress Notes (Signed)
Subjective/Chief Complaint: Pt without complaints    Objective: Vital signs in last 24 hours: Temp:  [98.4 F (36.9 C)-101.2 F (38.4 C)] 98.4 F (36.9 C) (02/18 0800) Pulse Rate:  [99-115] 107 (02/18 0800) Resp:  [14-25] 18 (02/18 0800) BP: (110-139)/(70-91) 110/74 (02/18 0800) SpO2:  [96 %-100 %] 97 % (02/18 0800) Last BM Date : 09/12/21  Intake/Output from previous day: 02/17 0701 - 02/18 0700 In: 3603 [P.O.:2403; IV Piggyback:1200] Out: -  Intake/Output this shift: No intake/output data recorded.  Incision/Wound: Left breast incision CDI  Minimal erythema if any no drainage moderate seroma  Lab Results:  Recent Labs    09/12/21 1023 09/13/21 0123  WBC 15.4* 12.7*  HGB 11.3* 9.2*  HCT 34.5* 28.8*  PLT 399 442*   BMET Recent Labs    09/12/21 1023 09/13/21 0123  NA 133* 135  K 3.1* 3.4*  CL 95* 104  CO2 23 18*  GLUCOSE 109* 181*  BUN 20 8  CREATININE 0.95 0.83  CALCIUM 9.3 8.6*   PT/INR No results for input(s): LABPROT, INR in the last 72 hours. ABG No results for input(s): PHART, HCO3 in the last 72 hours.  Invalid input(s): PCO2, PO2  Studies/Results: DG Chest 2 View  Result Date: 09/12/2021 CLINICAL DATA:  51 year old female with cough. EXAM: CHEST - 2 VIEW COMPARISON:  Chest radiographs 08/04/2021. FINDINGS: Stable right chest power port. Lower lung volumes. Mediastinal contours remain within normal limits. Both lungs remain clear. No pneumothorax or pleural effusion. Visualized tracheal air column is within normal limits. No acute osseous abnormality identified. Negative visible bowel gas. IMPRESSION: Lower lung volumes.  No acute cardiopulmonary abnormality. Electronically Signed   By: Genevie Ann M.D.   On: 09/12/2021 11:01   CT Head Wo Contrast  Result Date: 09/12/2021 CLINICAL DATA:  Headache. EXAM: CT HEAD WITHOUT CONTRAST TECHNIQUE: Contiguous axial images were obtained from the base of the skull through the vertex without intravenous  contrast. RADIATION DOSE REDUCTION: This exam was performed according to the departmental dose-optimization program which includes automated exposure control, adjustment of the mA and/or kV according to patient size and/or use of iterative reconstruction technique. COMPARISON:  None. FINDINGS: Brain: No evidence of acute infarction, hemorrhage, hydrocephalus, extra-axial collection or mass lesion/mass effect. Vascular: No hyperdense vessel or unexpected calcification. Skull: Normal. Negative for fracture or focal lesion. Sinuses/Orbits: No acute finding. Other: None. IMPRESSION: No acute intracranial abnormality seen. Electronically Signed   By: Marijo Conception M.D.   On: 09/12/2021 12:35   CT Angio Chest PE W and/or Wo Contrast  Result Date: 09/12/2021 CLINICAL DATA:  Cough, shortness of breath, tachycardia. EXAM: CT ANGIOGRAPHY CHEST WITH CONTRAST TECHNIQUE: Multidetector CT imaging of the chest was performed using the standard protocol during bolus administration of intravenous contrast. Multiplanar CT image reconstructions and MIPs were obtained to evaluate the vascular anatomy. RADIATION DOSE REDUCTION: This exam was performed according to the departmental dose-optimization program which includes automated exposure control, adjustment of the mA and/or kV according to patient size and/or use of iterative reconstruction technique. CONTRAST:  135mL OMNIPAQUE IOHEXOL 350 MG/ML SOLN COMPARISON:  None. FINDINGS: Cardiovascular: Preferential opacification of the thoracic aorta. No evidence of central pulmonary embolism. Evaluation of segmental and subsegmental pulmonary arteries is limited. No evidence of thoracic aortic aneurysm or dissection. Normal heart size. No pericardial effusion. Mediastinum/Nodes: No enlarged mediastinal, hilar, or axillary lymph nodes. Thyroid gland, trachea, and esophagus demonstrate no significant findings. Lungs/Pleura: Lungs are clear. No pleural effusion or pneumothorax. Upper  Abdomen: No acute abnormality. Musculoskeletal: There are multiple fluid collections in the left axilla/anterior chest wall in the surgical bed with small focus of gas. The largest collection in the left axilla measures at least 6.8 x 5.4 x 8.6 cm. There is also skin thickening. Review of the MIP images confirms the above findings. IMPRESSION: 1. No evidence of central pulmonary embolism, evaluation of segmental and subsegmental pulmonary arteries is limited due to phase of contrast enhancement. 2. Lungs are clear without evidence of focal consolidation or pleural effusion. 3. Postsurgical changes in the left axilla/left anterior chest wall with multiple fluid collections, the largest measuring at least 6.8 x 5.4 x 8.6 cm. These likely postsurgical seroma/hematoma. Surgical consultation for further management would be helpful. Electronically Signed   By: Keane Police D.O.   On: 09/12/2021 12:48   CT ABDOMEN PELVIS W CONTRAST  Addendum Date: 09/12/2021   ADDENDUM REPORT: 09/12/2021 13:07 ADDENDUM: These results were called by telephone at the time of interpretation on 09/12/2021 at 1:07 pm to provider [facility] , who verbally acknowledged these results. Electronically Signed   By: Lorin Picket M.D.   On: 09/12/2021 13:07   Result Date: 09/12/2021 CLINICAL DATA:  Fatigue, diarrhea and abdominal pain. EXAM: CT ABDOMEN AND PELVIS WITH CONTRAST TECHNIQUE: Multidetector CT imaging of the abdomen and pelvis was performed using the standard protocol following bolus administration of intravenous contrast. RADIATION DOSE REDUCTION: This exam was performed according to the departmental dose-optimization program which includes automated exposure control, adjustment of the mA and/or kV according to patient size and/or use of iterative reconstruction technique. CONTRAST:  160mL OMNIPAQUE IOHEXOL 350 MG/ML SOLN COMPARISON:  01/29/2014. FINDINGS: Lower chest: Mild subsegmental atelectasis in the lower lobes. Heart is  enlarged. No pericardial or pleural effusion. Distal esophagus is grossly unremarkable. Hepatobiliary: Liver is slightly decreased in attenuation diffusely and is enlarged, measuring 21.8 cm. Cholecystectomy. No biliary ductal dilatation. Pancreas: Mildly hyperdense lesion in the uncinate process of the pancreas measures 1.6 x 1.7 cm, not definitely seen on 01/29/2014. No ductal dilatation or gland atrophy. Spleen: Negative. Adrenals/Urinary Tract: Adrenal glands and kidneys are unremarkable. Ureters are decompressed. Bladder is grossly unremarkable. Stomach/Bowel: Small hiatal hernia. Stomach, small bowel, appendix and colon are unremarkable. Vascular/Lymphatic: Atherosclerotic calcification of the aorta. No pathologically enlarged lymph nodes. Reproductive: Uterus is enlarged and contains heterogeneous masses, the largest of which measures 6.3 cm and contains calcification. No adnexal mass. Other: No free fluid.  Mesenteries and peritoneum are unremarkable. Musculoskeletal: There are 2 fluid collections in the left breast. The lateral most collection is partially visualized, has a peripherally hyperattenuating margin, measuring at least 5.3 x 8.3 cm. A localization clip is seen along the anteromedial margin. A second fluid collection with a small amount of air is seen centrally within the upper left breast, measuring 6.9 x 8.1 cm. Overlying skin thickening. No worrisome lytic or sclerotic lesions. IMPRESSION: 1. Two fluid collections in the left breast, status post radioactive seed localization 08/22/2021. The lateral collection is suspicious for an abscess. The more central collection may represent a seroma or an abscess. 2. Mildly hypodense lesion in the uncinate process of the pancreas, not definitely seen on 01/29/2014. Non emergent MR abdomen without and with contrast is recommended in further evaluation, as malignancy cannot be excluded. 3. Steatotic enlarged liver. 4. Enlarged fibroid uterus. Electronically  Signed: By: Lorin Picket M.D. On: 09/12/2021 12:49    Anti-infectives: Anti-infectives (From admission, onward)    Start     Dose/Rate  Route Frequency Ordered Stop   09/13/21 1400  vancomycin (VANCOREADY) IVPB 1500 mg/300 mL        1,500 mg 150 mL/hr over 120 Minutes Intravenous Every 24 hours 09/12/21 1345     09/13/21 1300  cefTRIAXone (ROCEPHIN) 2 g in sodium chloride 0.9 % 100 mL IVPB        2 g 200 mL/hr over 30 Minutes Intravenous Every 24 hours 09/12/21 2102     09/12/21 1500  vancomycin (VANCOCIN) IVPB 1000 mg/200 mL premix        1,000 mg 200 mL/hr over 60 Minutes Intravenous  Once 09/12/21 1345 09/12/21 1719   09/12/21 1345  vancomycin (VANCOCIN) IVPB 1000 mg/200 mL premix        1,000 mg 200 mL/hr over 60 Minutes Intravenous  Once 09/12/21 1341 09/12/21 1617   09/12/21 1345  cefTRIAXone (ROCEPHIN) 2 g in sodium chloride 0.9 % 100 mL IVPB        2 g 200 mL/hr over 30 Minutes Intravenous  Once 09/12/21 1341 09/12/21 1502       Assessment/Plan: Patient Active Problem List   Diagnosis Date Noted   Left breast abscess 09/12/2021   Essential hypertension 09/12/2021   Lesion of pancreas 09/12/2021   Hypokalemia 09/12/2021   Genetic testing 04/08/2021   Port-A-Cath in place 04/03/2021   Family history of colon cancer 03/19/2021   Malignant neoplasm of upper-outer quadrant of left breast in female, estrogen receptor positive ([place]) 03/13/2021   Suicide ideation 07/07/2013   Major depressive disorder, recurrent severe without psychotic features ([place]) 07/06/2013     Clinically looks more like a seroma  IR to aspirate  Can go home on po abx at any time from surgery standpoint and follow up next week with Dr Donne Hazel   Recommend doxycycline 100 mg po x 7 days at discharge and that can be adjusted depending on aspiration outcome.    LOS: 1 day    Turner Daniels  MD  09/13/2021

## 2021-09-13 NOTE — Consult Note (Signed)
Chief Complaint: Patient was seen in consultation today for  Chief Complaint  Patient presents with   Cough    Referring Physician(s): Shelby Dubin, PA-C  Supervising Physician: Markus Daft  Patient Status: [facility] - In-pt  History of Present Illness: Joanna Osborn is a 51 y.o. female with a medical history significant for anxiety/depression, HTN and left breast cancer s/p neoadjuvant treatment and lumpectomy with deep axillary sentinel lymph node biopsy by Dr. Donne Hazel 08/25/21. A few days ago she developed fever, malaise, headache, loose stools and left breast swelling. She was admitted to [facility] for further evaluation.   CT abdomen/pelvis with contrast 09/12/21 IMPRESSION: 1. Two fluid collections in the left breast, status post radioactive seed localization 08/22/2021. The lateral collection is suspicious for an abscess. The more central collection may represent a seroma or an abscess. 2. Mildly hypodense lesion in the uncinate process of the pancreas, not definitely seen on 01/29/2014. Non emergent MR abdomen without and with contrast is recommended in further evaluation, as malignancy cannot be excluded. 3. Steatotic enlarged liver. 4. Enlarged fibroid uterus.  Interventional Radiology has been asked to evaluate this patient for image-guided breast fluid collection aspirations. Imaging reviewed and procedure approved by Dr. Vernard Gambles.   Past Medical History:  Diagnosis Date   Anxiety    Breast cancer ([place])    Depression    Family history of colon cancer 03/19/2021   History of kidney stones    Hypertension     Past Surgical History:  Procedure Laterality Date   BREAST LUMPECTOMY WITH RADIOACTIVE SEED AND SENTINEL LYMPH NODE BIOPSY Left 08/25/2021   Procedure: LEFT BREAST LUMPECTOMY WITH RADIOACTIVE SEED AND AXILLARY SENTINEL LYMPH NODE BIOPSY;  Surgeon: Rolm Bookbinder, MD;  Location: [place];  Service: General;  Laterality: Left;   CHOLECYSTECTOMY   2002   INSERTION OF MESH N/A 10/28/2016   Procedure: INSERTION OF MESH;  Surgeon: Clovis [name], MD;  Location: [place];  Service: General;  Laterality: N/A;   PORTACATH PLACEMENT Right 04/02/2021   Procedure: INSERTION PORT-A-CATH;  Surgeon: Rolm Bookbinder, MD;  Location: [place];  Service: General;  Laterality: Right;   [place] N/A 10/28/2016   Procedure: LAPAROSCOPIC VENTRAL HERNIA REPAIR;  Surgeon: Clovis [name], MD;  Location: [place];  Service: General;  Laterality: N/A;   WRIST SURGERY      Allergies: Endocet [oxycodone-acetaminophen], No known allergies, and Tape  Medications: Prior to Admission medications   Medication Sig Start Date End Date Taking? Authorizing Provider  acetaminophen (TYLENOL) 325 MG tablet Take 650 mg by mouth every 6 (six) hours as needed for moderate pain.   Yes [provider]  DM-Phenylephrine-Acetaminophen (ALKA-SELTZER PLS SINUS & COUGH PO) Take 1 capsule by mouth as needed (Headache).   Yes [provider]  ergocalciferol (VITAMIN D2) 1.25 MG (50000 UT) capsule Take 1 capsule by mouth every Tuesday. 06/29/19  Yes [provider]  hydrochlorothiazide (HYDRODIURIL) 25 MG tablet Take 25 mg by mouth daily. 02/26/21  Yes [provider]  HYDROcodone-acetaminophen (NORCO) 10-325 MG tablet Take 1 tablet by mouth every 6 (six) hours as needed. 08/25/21  Yes Rolm Bookbinder, MD  traMADol (ULTRAM) 50 MG tablet Take 100 mg by mouth every 6 (six) hours as needed for pain.   Yes [provider]  amoxicillin-clavulanate (AUGMENTIN) 875-125 MG tablet Take 1 tablet by mouth every 12 (twelve) hours. Patient not taking: Reported on 09/12/2021 08/04/21  [place], [place], [place]  diphenoxylate-atropine (LOMOTIL) 2.5-0.025 MG tablet Take 2 tablets by mouth 4 (four) times daily as needed for diarrhea or loose stools. Patient not taking: Reported on 09/12/2021 05/19/21   Truitt Merle, MD   LORazepam (ATIVAN) 1 MG tablet Take 1 tablet (1 mg total) by mouth every 8 (eight) hours as needed (nausea). Patient not taking: Reported on 09/12/2021 06/16/21   Truitt Merle, MD  potassium chloride SA (KLOR-CON M) 20 MEQ tablet Take 1 tablet (20 mEq total) by mouth daily. Patient not taking: Reported on 09/12/2021 07/27/21   Truitt Merle, MD  prochlorperazine (COMPAZINE) 10 MG tablet Take 1 tablet (10 mg total) by mouth every 6 (six) hours as needed (Nausea or vomiting). 03/19/21 09/05/21  Truitt Merle, MD     Family History  Problem Relation Age of Onset   Kidney disease Mother    Cancer Paternal Aunt        unknown type; dx after 44   Colon cancer Paternal Grandmother        dx unknown age   Colon cancer Paternal Grandfather        dx after 42   Hypertension Other    Cancer Other    Cancer Cousin        unknown type; dx before 41    Social History   Socioeconomic History   Marital status: Married    Spouse name: Not on file   Number of children: 2   Years of education: Not on file   Highest education level: Not on file  Occupational History   Not on file  Tobacco Use   Smoking status: Never   Smokeless tobacco: Never  Vaping Use   Vaping Use: Never used  Substance and Sexual Activity   Alcohol use: Yes    Alcohol/week: 1.0 standard drink    Types: 1 Glasses of wine per week    Comment: one glass wine twice monthly   Drug use: No    Comment: never used drugs   Sexual activity: Yes    Birth control/protection: None  Other Topics Concern   Not on file  Social History Narrative   Not on file   Social Determinants of Health   Financial Resource Strain: Not on file  Food Insecurity: No Food Insecurity   Worried About Charity fundraiser in the Last Year: Never true   Ran Out of Food in the Last Year: Never true  Transportation Needs: No Transportation Needs   Lack of Transportation (Medical): No   Lack of Transportation (Non-Medical): No  Physical Activity: Not on file   Stress: Not on file  Social Connections: Not on file    Review of Systems: A 12 point ROS discussed and pertinent positives are indicated in the HPI above.  All other systems are negative.  Review of Systems  Constitutional:  Positive for fatigue. Negative for appetite change.  Respiratory:  Negative for cough and shortness of breath.   Cardiovascular:  Negative for chest pain and leg swelling.  Gastrointestinal:  Negative for abdominal pain.  Neurological:  Positive for headaches.   Vital Signs: BP 110/74 (BP Location: Right Arm)    Pulse (!) 107    Temp 98.4 F (36.9 C) (Oral)    Resp 18    SpO2 97%   Physical Exam Constitutional:      General: She is not in acute distress.    Appearance: She is not ill-appearing.  HENT:     Mouth/Throat:  Mouth: Mucous membranes are moist.     Pharynx: Oropharynx is clear.  Pulmonary:     Effort: Pulmonary effort is normal.  Skin:    Comments: Left breast tender/swollen. Visible fluid collection near left axilla.   Neurological:     Mental Status: She is alert.    Imaging: DG Chest 2 View  Result Date: 09/12/2021 CLINICAL DATA:  51 year old female with cough. EXAM: CHEST - 2 VIEW COMPARISON:  Chest radiographs 08/04/2021. FINDINGS: Stable right chest power port. Lower lung volumes. Mediastinal contours remain within normal limits. Both lungs remain clear. No pneumothorax or pleural effusion. Visualized tracheal air column is within normal limits. No acute osseous abnormality identified. Negative visible bowel gas. IMPRESSION: Lower lung volumes.  No acute cardiopulmonary abnormality. Electronically Signed   By: Genevie Ann M.D.   On: 09/12/2021 11:01   CT Head Wo Contrast  Result Date: 09/12/2021 CLINICAL DATA:  Headache. EXAM: CT HEAD WITHOUT CONTRAST TECHNIQUE: Contiguous axial images were obtained from the base of the skull through the vertex without intravenous contrast. RADIATION DOSE REDUCTION: This exam was performed according to  the departmental dose-optimization program which includes automated exposure control, adjustment of the mA and/or kV according to patient size and/or use of iterative reconstruction technique. COMPARISON:  None. FINDINGS: Brain: No evidence of acute infarction, hemorrhage, hydrocephalus, extra-axial collection or mass lesion/mass effect. Vascular: No hyperdense vessel or unexpected calcification. Skull: Normal. Negative for fracture or focal lesion. Sinuses/Orbits: No acute finding. Other: None. IMPRESSION: No acute intracranial abnormality seen. Electronically Signed   By: Marijo Conception M.D.   On: 09/12/2021 12:35   CT Angio Chest PE W and/or Wo Contrast  Result Date: 09/12/2021 CLINICAL DATA:  Cough, shortness of breath, tachycardia. EXAM: CT ANGIOGRAPHY CHEST WITH CONTRAST TECHNIQUE: Multidetector CT imaging of the chest was performed using the standard protocol during bolus administration of intravenous contrast. Multiplanar CT image reconstructions and MIPs were obtained to evaluate the vascular anatomy. RADIATION DOSE REDUCTION: This exam was performed according to the departmental dose-optimization program which includes automated exposure control, adjustment of the mA and/or kV according to patient size and/or use of iterative reconstruction technique. CONTRAST:  161m OMNIPAQUE IOHEXOL 350 MG/ML SOLN COMPARISON:  None. FINDINGS: Cardiovascular: Preferential opacification of the thoracic aorta. No evidence of central pulmonary embolism. Evaluation of segmental and subsegmental pulmonary arteries is limited. No evidence of thoracic aortic aneurysm or dissection. Normal heart size. No pericardial effusion. Mediastinum/Nodes: No enlarged mediastinal, hilar, or axillary lymph nodes. Thyroid gland, trachea, and esophagus demonstrate no significant findings. Lungs/Pleura: Lungs are clear. No pleural effusion or pneumothorax. Upper Abdomen: No acute abnormality. Musculoskeletal: There are multiple fluid  collections in the left axilla/anterior chest wall in the surgical bed with small focus of gas. The largest collection in the left axilla measures at least 6.8 x 5.4 x 8.6 cm. There is also skin thickening. Review of the MIP images confirms the above findings. IMPRESSION: 1. No evidence of central pulmonary embolism, evaluation of segmental and subsegmental pulmonary arteries is limited due to phase of contrast enhancement. 2. Lungs are clear without evidence of focal consolidation or pleural effusion. 3. Postsurgical changes in the left axilla/left anterior chest wall with multiple fluid collections, the largest measuring at least 6.8 x 5.4 x 8.6 cm. These likely postsurgical seroma/hematoma. Surgical consultation for further management would be helpful. Electronically Signed   By: IKeane PoliceD.O.   On: 09/12/2021 12:48   CT ABDOMEN PELVIS W CONTRAST  Addendum Date: 09/12/2021  ADDENDUM REPORT: 09/12/2021 13:07 ADDENDUM: These results were called by telephone at the time of interpretation on 09/12/2021 at 1:07 pm to provider [facility] , who verbally acknowledged these results. Electronically Signed   By: Lorin Picket M.D.   On: 09/12/2021 13:07   Result Date: 09/12/2021 CLINICAL DATA:  Fatigue, diarrhea and abdominal pain. EXAM: CT ABDOMEN AND PELVIS WITH CONTRAST TECHNIQUE: Multidetector CT imaging of the abdomen and pelvis was performed using the standard protocol following bolus administration of intravenous contrast. RADIATION DOSE REDUCTION: This exam was performed according to the departmental dose-optimization program which includes automated exposure control, adjustment of the mA and/or kV according to patient size and/or use of iterative reconstruction technique. CONTRAST:  148m OMNIPAQUE IOHEXOL 350 MG/ML SOLN COMPARISON:  01/29/2014. FINDINGS: Lower chest: Mild subsegmental atelectasis in the lower lobes. Heart is enlarged. No pericardial or pleural effusion. Distal esophagus is grossly  unremarkable. Hepatobiliary: Liver is slightly decreased in attenuation diffusely and is enlarged, measuring 21.8 cm. Cholecystectomy. No biliary ductal dilatation. Pancreas: Mildly hyperdense lesion in the uncinate process of the pancreas measures 1.6 x 1.7 cm, not definitely seen on 01/29/2014. No ductal dilatation or gland atrophy. Spleen: Negative. Adrenals/Urinary Tract: Adrenal glands and kidneys are unremarkable. Ureters are decompressed. Bladder is grossly unremarkable. Stomach/Bowel: Small hiatal hernia. Stomach, small bowel, appendix and colon are unremarkable. Vascular/Lymphatic: Atherosclerotic calcification of the aorta. No pathologically enlarged lymph nodes. Reproductive: Uterus is enlarged and contains heterogeneous masses, the largest of which measures 6.3 cm and contains calcification. No adnexal mass. Other: No free fluid.  Mesenteries and peritoneum are unremarkable. Musculoskeletal: There are 2 fluid collections in the left breast. The lateral most collection is partially visualized, has a peripherally hyperattenuating margin, measuring at least 5.3 x 8.3 cm. A localization clip is seen along the anteromedial margin. A second fluid collection with a small amount of air is seen centrally within the upper left breast, measuring 6.9 x 8.1 cm. Overlying skin thickening. No worrisome lytic or sclerotic lesions. IMPRESSION: 1. Two fluid collections in the left breast, status post radioactive seed localization 08/22/2021. The lateral collection is suspicious for an abscess. The more central collection may represent a seroma or an abscess. 2. Mildly hypodense lesion in the uncinate process of the pancreas, not definitely seen on 01/29/2014. Non emergent MR abdomen without and with contrast is recommended in further evaluation, as malignancy cannot be excluded. 3. Steatotic enlarged liver. 4. Enlarged fibroid uterus. Electronically Signed: By: MLorin PicketM.D. On: 09/12/2021 12:49   MM Breast  Surgical Specimen  Result Date: 08/25/2021 CLINICAL DATA:  Status post excisional biopsy of the left breast. EXAM: SPECIMEN RADIOGRAPH OF THE LEFT BREAST COMPARISON:  Previous exam(s). FINDINGS: Status post excision of the left breast. The radioactive seed and biopsy marker clip are present, completely intact, and were marked for pathology. IMPRESSION: Specimen radiograph of the left breast. Electronically Signed   By: DLillia MountainM.D.   On: 08/25/2021 08:13  ECHOCARDIOGRAM COMPLETE  Result Date: 09/03/2021    ECHOCARDIOGRAM REPORT   Patient Name:   KSOLOMIYA PASCALEDate of Exam: 09/03/2021 Medical Rec #:  [id number]   Height:       65.0 in Accession #:    [id number]  Weight:       213.4 lb Date of Birth:  108-06-1971  BSA:          2.033 m Patient Age:    89years     BP:  127/90 mmHg Patient Gender: F            HR:           96 bpm. Exam Location:  Outpatient Procedure: 2D Echo, 3D Echo, Cardiac Doppler, Color Doppler and Strain Analysis Indications:    Chemo Z09  History:        Patient has prior history of Echocardiogram examinations, most                 recent 04/02/2021. Risk Factors:Hypertension. Breast Cancer.  Sonographer:    Darlina Sicilian RDCS Referring Phys: [id number] [place]  1. Left ventricular ejection fraction, by estimation, is 60 to 65%. Left ventricular ejection fraction by 3D volume is 61 %. The left ventricle has normal function. The left ventricle has no regional wall motion abnormalities. There is mild left ventricular hypertrophy. Left ventricular diastolic parameters are consistent with Grade I diastolic dysfunction (impaired relaxation). The average left ventricular global longitudinal strain is -19.1 %. The global longitudinal strain is normal.  2. Right ventricular systolic function is normal. The right ventricular size is normal. There is normal pulmonary artery systolic pressure.  3. The mitral valve is grossly normal. No evidence of mitral valve regurgitation.  4.  The aortic valve is tricuspid. Aortic valve regurgitation is not visualized.  5. The inferior vena cava is normal in size with greater than 50% respiratory variability, suggesting right atrial pressure of 3 mmHg. Comparison(s): No significant change from prior study. 04/02/2021: LVEF 60-65%, grade 1 DD, GLS -21%. FINDINGS  Left Ventricle: Left ventricular ejection fraction, by estimation, is 60 to 65%. Left ventricular ejection fraction by 3D volume is 61 %. The left ventricle has normal function. The left ventricle has no regional wall motion abnormalities. The average left ventricular global longitudinal strain is -19.1 %. The global longitudinal strain is normal. The left ventricular internal cavity size was normal in size. There is mild left ventricular hypertrophy. Left ventricular diastolic parameters are consistent with Grade I diastolic dysfunction (impaired relaxation). Indeterminate filling pressures. Right Ventricle: The right ventricular size is normal. No increase in right ventricular wall thickness. Right ventricular systolic function is normal. There is normal pulmonary artery systolic pressure. The tricuspid regurgitant velocity is 1.48 m/s, and  with an assumed right atrial pressure of 3 mmHg, the estimated right ventricular systolic pressure is 65.9 mmHg. Left Atrium: Left atrial size was normal in size. Right Atrium: Right atrial size was normal in size. Pericardium: There is no evidence of pericardial effusion. Mitral Valve: The mitral valve is grossly normal. No evidence of mitral valve regurgitation. Tricuspid Valve: The tricuspid valve is grossly normal. Tricuspid valve regurgitation is trivial. Aortic Valve: The aortic valve is tricuspid. Aortic valve regurgitation is not visualized. Pulmonic Valve: The pulmonic valve was normal in structure. Pulmonic valve regurgitation is not visualized. Aorta: The aortic root and ascending aorta are structurally normal, with no evidence of dilitation.  Venous: The inferior vena cava is normal in size with greater than 50% respiratory variability, suggesting right atrial pressure of 3 mmHg. IAS/Shunts: No atrial level shunt detected by color flow Doppler.  LEFT VENTRICLE PLAX 2D LVIDd:         3.60 cm         Diastology LVIDs:         2.70 cm         LV e' medial:    5.33 cm/s LV PW:         1.10 cm  LV E/e' medial:  9.7 LV IVS:        1.30 cm         LV e' lateral:   8.16 cm/s LVOT diam:     2.00 cm         LV E/e' lateral: 6.3 LV SV:         42 LV SV Index:   21              2D LVOT Area:     3.14 cm        Longitudinal                                Strain                                2D Strain GLS  -21.3 %                                (A2C):                                2D Strain GLS  -15.1 %                                (A3C):                                2D Strain GLS  -20.8 %                                (A4C):                                2D Strain GLS  -19.1 %                                Avg:                                 3D Volume EF                                LV 3D EF:    Left                                             ventricul                                             ar  ejection                                             fraction                                             by 3D                                             volume is                                             61 %.                                 3D Volume EF:                                3D EF:        61 %                                LV EDV:       120 ml                                LV ESV:       47 ml                                LV SV:        73 ml RIGHT VENTRICLE RV S prime:     12.00 cm/s TAPSE (M-mode): 1.8 cm LEFT ATRIUM             Index        RIGHT ATRIUM           Index LA diam:        3.10 cm 1.52 cm/m   RA Area:     10.50 cm LA Vol (A2C):   30.3 ml 14.90 ml/m  RA Volume:   18.70 ml   9.20 ml/m LA Vol (A4C):   38.5 ml 18.93 ml/m LA Biplane Vol: 31.0 ml 15.25 ml/m  AORTIC VALVE LVOT Vmax:   84.20 cm/s LVOT Vmean:  57.600 cm/s LVOT VTI:    0.135 m  AORTA Ao Root diam: 3.20 cm Ao Asc diam:  3.40 cm MITRAL VALVE               TRICUSPID VALVE MV Area (PHT): 3.40 cm    TR Peak grad:   8.8 mmHg MV Decel Time: 223 msec    TR Vmax:        148.00 cm/s MV E velocity: 51.80 cm/s MV A velocity: 72.00 cm/s  SHUNTS MV E/A ratio:  0.72  Systemic VTI:  0.14 m                            Systemic Diam: 2.00 cm Lyman Bishop MD Electronically signed by Lyman Bishop MD Signature Date/Time: 09/03/2021/10:27:44 AM    Final    MM LT RADIOACTIVE SEED LOC MAMMO GUIDE  Result Date: 08/22/2021 CLINICAL DATA:  Localization prior to surgery EXAM: MAMMOGRAPHIC GUIDED RADIOACTIVE SEED LOCALIZATION OF THE LEFT BREAST COMPARISON:  Previous exam(s). FINDINGS: Patient presents for radioactive seed localization prior to surgery. I met with the patient and we discussed the procedure of seed localization including benefits and alternatives. We discussed the high likelihood of a successful procedure. We discussed the risks of the procedure including infection, bleeding, tissue injury and further surgery. We discussed the low dose of radioactivity involved in the procedure. Informed, written consent was given. The usual time-out protocol was performed immediately prior to the procedure. Using mammographic guidance, sterile technique, 1% lidocaine and an I-125 radioactive seed, site of known malignancy was localized using a superior approach. The follow-up mammogram images confirm the seed in the expected location and were marked for the surgeon. Follow-up survey of the patient confirms presence of the radioactive seed. Order number of I-125 seed:  [id number]. Total activity:  6.812 millicuries reference Date: August 11, 2021 The patient tolerated the procedure well and was released from the [place]. She was given  instructions regarding seed removal. IMPRESSION: Radioactive seed localization left breast. No apparent complications. Electronically Signed   By: Dorise Bullion III M.D.   On: 08/22/2021 15:16   Labs:  CBC: Recent Labs    08/14/21 1308 09/05/21 1234 09/12/21 1023 09/13/21 0123  WBC 5.0 5.9 15.4* 12.7*  HGB 8.9* 10.2* 11.3* 9.2*  HCT 27.4* 30.6* 34.5* 28.8*  PLT 161 459* 399 442*    COAGS: No results for input(s): INR, APTT in the last 8760 hours.  BMP: Recent Labs    08/14/21 1308 09/05/21 1234 09/12/21 1023 09/13/21 0123  NA 140 138 133* 135  K 3.4* 3.4* 3.1* 3.4*  CL 104 103 95* 104  CO2 _0 18*  GLUCOSE 142* 102* 109* 181*  BUN _1 CALCIUM 9.8 9.8 9.3 8.6*  CREATININE 0.71 0.71 0.95 0.83  GFRNONAA >60 >60 >60 >60    LIVER FUNCTION TESTS: Recent Labs    07/24/21 1053 08/14/21 1308 09/05/21 1234 09/12/21 1023  BILITOT 0.3 0.4 0.6 1.0  AST 18 28 35 38  ALT 22 38 39 43  ALKPHOS 54 50 55 62  PROT 7.5 7.2 7.2 8.2*  ALBUMIN 4.2 4.2 4.2 3.7    TUMOR MARKERS: No results for input(s): AFPTM, CEA, CA199, CHROMGRNA in the last 8760 hours.  Assessment and Plan:  History of left breast cancer s/p lumpectomy and deep axillary sentinel node biopsy now with multiple left breast fluid collections: Minus Liberty, 51 year old female, is tentatively scheduled today for image-guided left breast fluid collection aspirations. This procedure will be done with local anesthesia only.  Risks and benefits discussed with the patient including bleeding, infection, damage to adjacent structures and sepsis.  All of the patient's questions were answered, patient is agreeable to proceed.  Consent signed and in IR  Thank you for this interesting consult.  I greatly enjoyed meeting Joanna Osborn and look forward to participating in their care.  A copy of this report was sent to the requesting provider on this  date.  Electronically Signed: Soyla Dryer,  AGACNP-BC [phone] 09/13/2021, 9:12 AM   I spent a total of 20 Minutes    in face to face in clinical consultation, greater than 50% of which was counseling/coordinating care for left breast fluid collection aspirations.

## 2021-09-13 NOTE — Procedures (Signed)
Interventional Radiology Procedure:   Indications: Left breast fluid collections, post op  Procedure: US guided aspiration of left axilla fluid collection and left anterior breast collections  Findings: 1)  Removed 200 ml of opaque yellow fluid from left axilla collection.  2) Removed 100 ml of opaque yellow fluid from complex left anterior breast collection. 3) Both collections were decompressed with minimal residual fluid after aspiration.   Complications: No immediate complications noted.     EBL: Minimal  Plan: Send fluid for culture.    Obdulia Steier R. Anselm Pancoast, MD  Pager: [phone]

## 2021-09-14 DIAGNOSIS — N611 Abscess of the breast and nipple: Secondary | ICD-10-CM | POA: Diagnosis not present

## 2021-09-14 LAB — CBC
HCT: 26.8 % — ABNORMAL LOW (ref 36.0–46.0)
Hemoglobin: 8.4 g/dL — ABNORMAL LOW (ref 12.0–15.0)
MCH: 31.2 pg (ref 26.0–34.0)
MCHC: 31.3 g/dL (ref 30.0–36.0)
MCV: 99.6 fL (ref 80.0–100.0)
Platelets: 334 10*3/uL (ref 150–400)
RBC: 2.69 MIL/uL — ABNORMAL LOW (ref 3.87–5.11)
RDW: 15.9 % — ABNORMAL HIGH (ref 11.5–15.5)
WBC: 11.4 10*3/uL — ABNORMAL HIGH (ref 4.0–10.5)
nRBC: 0 % (ref 0.0–0.2)

## 2021-09-14 LAB — BASIC METABOLIC PANEL
Anion gap: 11 (ref 5–15)
BUN: 10 mg/dL (ref 6–20)
CO2: 22 mmol/L (ref 22–32)
Calcium: 9 mg/dL (ref 8.9–10.3)
Chloride: 108 mmol/L (ref 98–111)
Creatinine, Ser: 0.72 mg/dL (ref 0.44–1.00)
GFR, Estimated: >60 mL/min (ref >60)
Glucose, Bld: 127 mg/dL — ABNORMAL HIGH (ref 70–99)
Potassium: 3.2 mmol/L — ABNORMAL LOW (ref 3.5–5.1)
Sodium: 141 mmol/L (ref 135–145)

## 2021-09-14 LAB — GLUCOSE, CAPILLARY
Glucose-Capillary: 121 mg/dL — ABNORMAL HIGH (ref 70–99)
Glucose-Capillary: 119 mg/dL — ABNORMAL HIGH (ref 70–99)

## 2021-09-14 LAB — MAGNESIUM: Magnesium: 1.7 mg/dL (ref 1.7–2.4)

## 2021-09-14 MED ORDER — POLYETHYLENE GLYCOL 3350 17 G PO PACK: 17.0000 g | PACK | Freq: Every day | ORAL | 0 refills | Status: DC | PRN

## 2021-09-14 MED ORDER — POTASSIUM CHLORIDE CRYS ER 20 MEQ PO TBCR
40.0000 meq | EXTENDED_RELEASE_TABLET | Freq: Two times a day (BID) | ORAL | Status: DC
  Administered 2021-09-14: 40 meq via ORAL
  Filled 2021-09-14: qty 2

## 2021-09-14 MED ORDER — PREDNISONE 20 MG PO TABS
20.0000 mg | ORAL_TABLET | Freq: Every day | ORAL | Status: DC
  Administered 2021-09-14: 20 mg via ORAL
  Filled 2021-09-14: qty 1

## 2021-09-14 MED ORDER — HEPARIN SOD (PORK) LOCK FLUSH 100 UNIT/ML IV SOLN
500.0000 [IU] | INTRAVENOUS | Status: AC | PRN
  Administered 2021-09-14: 500 [IU]
  Filled 2021-09-14: qty 5

## 2021-09-14 MED ORDER — POTASSIUM CHLORIDE CRYS ER 20 MEQ PO TBCR: 40.0000 meq | EXTENDED_RELEASE_TABLET | Freq: Every day | ORAL | 0 refills | Status: DC

## 2021-09-14 MED ORDER — BENZONATATE 200 MG PO CAPS: 200.0000 mg | ORAL_CAPSULE | Freq: Three times a day (TID) | ORAL | 0 refills | Status: DC

## 2021-09-14 MED ORDER — DOXYCYCLINE HYCLATE 100 MG PO TABS
100.0000 mg | ORAL_TABLET | Freq: Two times a day (BID) | ORAL | Status: DC
  Administered 2021-09-14: 100 mg via ORAL
  Filled 2021-09-14: qty 1

## 2021-09-14 MED ORDER — PREDNISONE 20 MG PO TABS
20.0000 mg | ORAL_TABLET | Freq: Every day | ORAL | 0 refills | Status: AC
Start: 2021-09-15 — End: 2021-09-20

## 2021-09-14 MED ORDER — DOXYCYCLINE HYCLATE 50 MG PO CAPS: 100.0000 mg | ORAL_CAPSULE | Freq: Two times a day (BID) | ORAL | 0 refills | Status: AC

## 2021-09-14 NOTE — Discharge Summary (Signed)
Discharge Summary  Joanna Osborn QBV:[id number] DOB: 08-13-1970  PCP: Lennie Odor, PA  Admit date: 09/12/2021 Discharge date: 09/14/2021  Time spent: 35 minutes.  Recommendations for Outpatient Follow-up:  Follow-up with your medical oncologist Dr. Burr Medico. Follow-up with general surgery. Follow-up with interventional radiology Follow-up with your primary care provider Take your medications as prescribed.  Discharge Diagnoses:  Active Hospital Problems   Diagnosis Date Noted   Left breast abscess 09/12/2021   Essential hypertension 09/12/2021   Lesion of pancreas 09/12/2021   Hypokalemia 09/12/2021   Malignant neoplasm of upper-outer quadrant of left breast in female, estrogen receptor positive ([place]) 03/13/2021    Resolved Hospital Problems  No resolved problems to display.    Discharge Condition: Stable.  Diet recommendation: Resume previous diet.  Vitals:   09/14/21 0817 09/14/21 0819  BP: 132/85   Pulse: 90   Resp: 18   Temp: 98 F (36.7 C)   SpO2: 99% 99%    History of present illness:  Joanna Osborn is a 51 y.o. female with medical history significant for left breast cancer s/p neoadjuvant chemotherapy followed by lumpectomy with seed placement 08/25/2021, on post adjuvant treatment with trastuzumab (infusion 09/05/2021), hypertension, depression/anxiety who presented to the ED for evaluation of nonproductive cough, headache, abdominal pain, and diarrhea of 3 days duration.  Associated with increase in swelling and redness at her left breast.  She did see her surgeon, Dr. Donne Hazel, and per the patient that area was attempted to be aspirated without any fluid obtained.  Since then she has developed chills with diaphoresis.  She presented to [facility] ED for further evaluation and management of her symptomatology.  She was transferred to [facility] and admitted by [facility], hospitalist service.   Work-up revealed left breast abscesses for which general surgery and IR  were consulted.  Status post ultrasound-guided aspiration of left axillary fluid collection and left anterior breast fluid collections.  200 cc of fluid removed on 09/13/2021, sent for culture.  Deep wound culture growing [place], susceptibilities to follow.   09/14/21: Seen at her bedside.  No acute events overnight.  She has no new complaints.  Seen by general surgery okay to discharge from a surgical standpoint.    Hospital Course:  Principal Problem:   Left breast abscess Active Problems:   Malignant neoplasm of upper-outer quadrant of left breast in female, estrogen receptor positive ([place])   Essential hypertension   Lesion of pancreas   Hypokalemia  Left breast abscesses status post ultrasound-guided aspiration by interventional radiology on 09/13/2021- (present on admission) CT A/P showed 2 fluid collections in the left breast.  Lateral collection suspicious for abscess, more central collection may represent seroma versus abscess.  Patient did have fever up to 101.2 F and leukocytosis of 15.4K in the ED. -Seen by General surgery and interventional radiology. -Ultrasound-guided aspiration by IR on 09/13/2021. MRSA screening test negative on 09/13/2021 -Received empiric antibiotics with IV vancomycin and ceftriaxone Deep wound culture obtained on 09/13/2021 growing [place], susceptibilities to follow. Follow-up with general surgery and interventional neurology.   Malignant neoplasm of upper-outer quadrant of left breast in female, estrogen receptor positive ([place]) Follows with oncology, Dr. Burr Medico. S/p neoadjuvant chemotherapy followed by lumpectomy with seed placement 08/25/2021 now on post adjuvant treatment with trastuzumab (last infusion 09/05/2021).  Suspect her GI symptoms, cough, headache may be secondary to immunotherapy. Follow-up with your medical oncologist.  Intractable cough, nonproductive Mild wheezing on exam, complete prednisone 20 mg x 5  days. Complete doxycycline 100 mg twice daily x7 days for left breast abscess Tessalon Perles as needed for cough Personally reviewed chest x-ray done on 09/12/2021, no acute cardiopulmonary abnormality. Follow-up with your primary care provider.   Essential hypertension- (present on admission) BP stable.  Holding HCTZ with hypokalemia.   Refractory hypokalemia- (present on admission) Potassium 3.2, repleted orally. Continue potassium replacement 40 meq daily x5 days. Follow-up to PCP.  Resolved mild anion gap metabolic acidosis Serum bicarb 22, anion gap 11.   Hyperglycemia, no history of diabetes Hemoglobin A1c 4.8 on 09/13/2021. Avoid hypoglycemia.   Lesion of pancreas- (present on admission) Mildly hypodense lesion in the uncinate process of the pancreas seen on CT abdomen/pelvis.  Nonemergent MRI abdomen with and without contrast recommended for further evaluation Follow-up with your medical oncologist.   Hepatomegaly, incidental findings LFTs normal Follow-up with your primary care provider and medical oncologist.    Code Status: Full code  Consults called: General surgery, IR       Discharge Exam: BP 132/85 (BP Location: Right Arm)    Pulse 90    Temp 98 F (36.7 C) (Oral)    Resp 18    SpO2 99%  General: 51 y.o. year-old female well developed well nourished in no acute distress.  Alert and oriented x3. Cardiovascular: Regular rate and rhythm with no rubs or gallops.  No thyromegaly or JVD noted.   Respiratory: Faint wheezing bilaterally.   Abdomen: Soft nontender nondistended with normal bowel sounds x4 quadrants. Musculoskeletal: No lower extremity edema bilaterally.   Skin: No ulcerative lesions noted or rashes, Psychiatry: Mood is appropriate for condition and setting  Discharge Instructions You were cared for by a hospitalist during your hospital stay. If you have any questions about your discharge medications or the care you received while you were in the  hospital after you are discharged, you can call the unit and asked to speak with the hospitalist on call if the hospitalist that took care of you is not available. Once you are discharged, your primary care physician will handle any further medical issues. Please note that NO REFILLS for any discharge medications will be authorized once you are discharged, as it is imperative that you return to your primary care physician (or establish a relationship with a primary care physician if you do not have one) for your aftercare needs so that they can reassess your need for medications and monitor your lab values.   Allergies as of 09/14/2021       Reactions   Endocet [oxycodone-acetaminophen] Itching   No Known Allergies    Tape Rash        Medication List     STOP taking these medications    ALKA-SELTZER PLS SINUS & COUGH PO   amoxicillin-clavulanate 875-125 MG tablet Commonly known as: AUGMENTIN   diphenoxylate-atropine 2.5-0.025 MG tablet Commonly known as: LOMOTIL   hydrochlorothiazide 25 MG tablet Commonly known as: HYDRODIURIL   LORazepam 1 MG tablet Commonly known as: ATIVAN       TAKE these medications    acetaminophen 325 MG tablet Commonly known as: TYLENOL Take 650 mg by mouth every 6 (six) hours as needed for moderate pain.   benzonatate 200 MG capsule Commonly known as: TESSALON Take 1 capsule (200 mg total) by mouth 3 (three) times daily.   doxycycline 50 MG capsule Commonly known as: VIBRAMYCIN Take 2 capsules (100 mg total) by mouth 2 (two) times daily for 7 days.   ergocalciferol 1.25 MG (  50000 UT) capsule Commonly known as: VITAMIN D2 Take 1 capsule by mouth every Tuesday.   HYDROcodone-acetaminophen 10-325 MG tablet Commonly known as: NORCO Take 1 tablet by mouth every 6 (six) hours as needed.   polyethylene glycol 17 g packet Commonly known as: MiraLax Take 17 g by mouth daily as needed.   potassium chloride SA 20 MEQ tablet Commonly known  as: KLOR-CON M Take 2 tablets (40 mEq total) by mouth daily for 5 days. What changed: how much to take   predniSONE 20 MG tablet Commonly known as: DELTASONE Take 1 tablet (20 mg total) by mouth daily with breakfast for 5 days. Start taking on: September 15, 2021   traMADol 50 MG tablet Commonly known as: ULTRAM Take 100 mg by mouth every 6 (six) hours as needed for pain.       Allergies  Allergen Reactions   Endocet [Oxycodone-Acetaminophen] Itching   No Known Allergies    Tape Rash    Follow-up Information     Redmon, Noelle, PA. Call today.   Specialty: Physician Assistant Why: Please call for a posthospital follow-up appointment. Contact information: [address] [place] [address] [phone]         Truitt Merle, MD. Call today.   Specialties: Hematology, Oncology Why: Please call for a posthospital follow-up appointment. Contact information: [place] [address] [phone]         Erroll Luna, MD. Call today.   Specialty: General Surgery Why: Please call for a posthospital follow-up appointment. Contact information: [address] [place] [address] [phone]         Markus Daft, MD. Call today.   Specialties: Interventional Radiology, Radiology Why: Please call for a posthospital follow-up appointment. Contact information: [place]  [place] [address] [phone]                  The results of significant diagnostics from this hospitalization (including imaging, microbiology, ancillary and laboratory) are listed below for reference.    Significant Diagnostic Studies: DG Chest 2 View  Result Date: 09/12/2021 CLINICAL DATA:  51 year old female with cough. EXAM: CHEST - 2 VIEW COMPARISON:  Chest radiographs 08/04/2021. FINDINGS: Stable right chest power port. Lower lung volumes. Mediastinal contours remain within normal limits. Both lungs remain  clear. No pneumothorax or pleural effusion. Visualized tracheal air column is within normal limits. No acute osseous abnormality identified. Negative visible bowel gas. IMPRESSION: Lower lung volumes.  No acute cardiopulmonary abnormality. Electronically Signed   By: Genevie Ann M.D.   On: 09/12/2021 11:01   CT Head Wo Contrast  Result Date: 09/12/2021 CLINICAL DATA:  Headache. EXAM: CT HEAD WITHOUT CONTRAST TECHNIQUE: Contiguous axial images were obtained from the base of the skull through the vertex without intravenous contrast. RADIATION DOSE REDUCTION: This exam was performed according to the departmental dose-optimization program which includes automated exposure control, adjustment of the mA and/or kV according to patient size and/or use of iterative reconstruction technique. COMPARISON:  None. FINDINGS: Brain: No evidence of acute infarction, hemorrhage, hydrocephalus, extra-axial collection or mass lesion/mass effect. Vascular: No hyperdense vessel or unexpected calcification. Skull: Normal. Negative for fracture or focal lesion. Sinuses/Orbits: No acute finding. Other: None. IMPRESSION: No acute intracranial abnormality seen. Electronically Signed   By: Marijo Conception M.D.   On: 09/12/2021 12:35   CT Angio Chest PE W and/or Wo Contrast  Result Date: 09/12/2021 CLINICAL DATA:  Cough, shortness of  breath, tachycardia. EXAM: CT ANGIOGRAPHY CHEST WITH CONTRAST TECHNIQUE: Multidetector CT imaging of the chest was performed using the standard protocol during bolus administration of intravenous contrast. Multiplanar CT image reconstructions and MIPs were obtained to evaluate the vascular anatomy. RADIATION DOSE REDUCTION: This exam was performed according to the departmental dose-optimization program which includes automated exposure control, adjustment of the mA and/or kV according to patient size and/or use of iterative reconstruction technique. CONTRAST:  124m OMNIPAQUE IOHEXOL 350 MG/ML SOLN COMPARISON:   None. FINDINGS: Cardiovascular: Preferential opacification of the thoracic aorta. No evidence of central pulmonary embolism. Evaluation of segmental and subsegmental pulmonary arteries is limited. No evidence of thoracic aortic aneurysm or dissection. Normal heart size. No pericardial effusion. Mediastinum/Nodes: No enlarged mediastinal, hilar, or axillary lymph nodes. Thyroid gland, trachea, and esophagus demonstrate no significant findings. Lungs/Pleura: Lungs are clear. No pleural effusion or pneumothorax. Upper Abdomen: No acute abnormality. Musculoskeletal: There are multiple fluid collections in the left axilla/anterior chest wall in the surgical bed with small focus of gas. The largest collection in the left axilla measures at least 6.8 x 5.4 x 8.6 cm. There is also skin thickening. Review of the MIP images confirms the above findings. IMPRESSION: 1. No evidence of central pulmonary embolism, evaluation of segmental and subsegmental pulmonary arteries is limited due to phase of contrast enhancement. 2. Lungs are clear without evidence of focal consolidation or pleural effusion. 3. Postsurgical changes in the left axilla/left anterior chest wall with multiple fluid collections, the largest measuring at least 6.8 x 5.4 x 8.6 cm. These likely postsurgical seroma/hematoma. Surgical consultation for further management would be helpful. Electronically Signed   By: IKeane PoliceD.O.   On: 09/12/2021 12:48   CT ABDOMEN PELVIS W CONTRAST  Addendum Date: 09/12/2021   ADDENDUM REPORT: 09/12/2021 13:07 ADDENDUM: These results were called by telephone at the time of interpretation on 09/12/2021 at 1:07 pm to [facility], who verbally acknowledged these results. Electronically Signed   By: MLorin PicketM.D.   On: 09/12/2021 13:07   Result Date: 09/12/2021 CLINICAL DATA:  Fatigue, diarrhea and abdominal pain. EXAM: CT ABDOMEN AND PELVIS WITH CONTRAST TECHNIQUE: Multidetector CT imaging of the abdomen  and pelvis was performed using the standard protocol following bolus administration of intravenous contrast. RADIATION DOSE REDUCTION: This exam was performed according to the departmental dose-optimization program which includes automated exposure control, adjustment of the mA and/or kV according to patient size and/or use of iterative reconstruction technique. CONTRAST:  1033mOMNIPAQUE IOHEXOL 350 MG/ML SOLN COMPARISON:  01/29/2014. FINDINGS: Lower chest: Mild subsegmental atelectasis in the lower lobes. Heart is enlarged. No pericardial or pleural effusion. Distal esophagus is grossly unremarkable. Hepatobiliary: Liver is slightly decreased in attenuation diffusely and is enlarged, measuring 21.8 cm. Cholecystectomy. No biliary ductal dilatation. Pancreas: Mildly hyperdense lesion in the uncinate process of the pancreas measures 1.6 x 1.7 cm, not definitely seen on 01/29/2014. No ductal dilatation or gland atrophy. Spleen: Negative. Adrenals/Urinary Tract: Adrenal glands and kidneys are unremarkable. Ureters are decompressed. Bladder is grossly unremarkable. Stomach/Bowel: Small hiatal hernia. Stomach, small bowel, appendix and colon are unremarkable. Vascular/Lymphatic: Atherosclerotic calcification of the aorta. No pathologically enlarged lymph nodes. Reproductive: Uterus is enlarged and contains heterogeneous masses, the largest of which measures 6.3 cm and contains calcification. No adnexal mass. Other: No free fluid.  Mesenteries and peritoneum are unremarkable. Musculoskeletal: There are 2 fluid collections in the left breast. The lateral most collection is partially visualized, has a peripherally hyperattenuating margin, measuring  at least 5.3 x 8.3 cm. A localization clip is seen along the anteromedial margin. A second fluid collection with a small amount of air is seen centrally within the upper left breast, measuring 6.9 x 8.1 cm. Overlying skin thickening. No worrisome lytic or sclerotic lesions.  IMPRESSION: 1. Two fluid collections in the left breast, status post radioactive seed localization 08/22/2021. The lateral collection is suspicious for an abscess. The more central collection may represent a seroma or an abscess. 2. Mildly hypodense lesion in the uncinate process of the pancreas, not definitely seen on 01/29/2014. Non emergent MR abdomen without and with contrast is recommended in further evaluation, as malignancy cannot be excluded. 3. Steatotic enlarged liver. 4. Enlarged fibroid uterus. Electronically Signed: By: Lorin Picket M.D. On: 09/12/2021 12:49   MM Breast Surgical Specimen  Result Date: 08/25/2021 CLINICAL DATA:  Status post excisional biopsy of the left breast. EXAM: SPECIMEN RADIOGRAPH OF THE LEFT BREAST COMPARISON:  Previous exam(s). FINDINGS: Status post excision of the left breast. The radioactive seed and biopsy marker clip are present, completely intact, and were marked for pathology. IMPRESSION: Specimen radiograph of the left breast. Electronically Signed   By: Lillia Mountain M.D.   On: 08/25/2021 08:13  ECHOCARDIOGRAM COMPLETE  Result Date: 09/03/2021    ECHOCARDIOGRAM REPORT   Patient Name:   Joanna Osborn Date of Exam: 09/03/2021 Medical Rec #:  [id number]    Height:       65.0 in Accession #:    [id number]   Weight:       213.4 lb Date of Birth:  Dec 22, 1970   BSA:          2.033 m Patient Age:    39 years     BP:           127/90 mmHg Patient Gender: F            HR:           96 bpm. Exam Location:  Outpatient Procedure: 2D Echo, 3D Echo, Cardiac Doppler, Color Doppler and Strain Analysis Indications:    Chemo Z09  History:        Patient has prior history of Echocardiogram examinations, most                 recent 04/02/2021. Risk Factors:Hypertension. Breast Cancer.  Sonographer:    Darlina Sicilian RDCS Referring Phys: [id number] [place]  1. Left ventricular ejection fraction, by estimation, is 60 to 65%. Left ventricular ejection fraction by 3D volume is 61 %.  The left ventricle has normal function. The left ventricle has no regional wall motion abnormalities. There is mild left ventricular hypertrophy. Left ventricular diastolic parameters are consistent with Grade I diastolic dysfunction (impaired relaxation). The average left ventricular global longitudinal strain is -19.1 %. The global longitudinal strain is normal.  2. Right ventricular systolic function is normal. The right ventricular size is normal. There is normal pulmonary artery systolic pressure.  3. The mitral valve is grossly normal. No evidence of mitral valve regurgitation.  4. The aortic valve is tricuspid. Aortic valve regurgitation is not visualized.  5. The inferior vena cava is normal in size with greater than 50% respiratory variability, suggesting right atrial pressure of 3 mmHg. Comparison(s): No significant change from prior study. 04/02/2021: LVEF 60-65%, grade 1 DD, GLS -21%. FINDINGS  Left Ventricle: Left ventricular ejection fraction, by estimation, is 60 to 65%. Left ventricular ejection fraction by 3D volume is 61 %. The left  ventricle has normal function. The left ventricle has no regional wall motion abnormalities. The average left ventricular global longitudinal strain is -19.1 %. The global longitudinal strain is normal. The left ventricular internal cavity size was normal in size. There is mild left ventricular hypertrophy. Left ventricular diastolic parameters are consistent with Grade I diastolic dysfunction (impaired relaxation). Indeterminate filling pressures. Right Ventricle: The right ventricular size is normal. No increase in right ventricular wall thickness. Right ventricular systolic function is normal. There is normal pulmonary artery systolic pressure. The tricuspid regurgitant velocity is 1.48 m/s, and  with an assumed right atrial pressure of 3 mmHg, the estimated right ventricular systolic pressure is 69.7 mmHg. Left Atrium: Left atrial size was normal in size. Right  Atrium: Right atrial size was normal in size. Pericardium: There is no evidence of pericardial effusion. Mitral Valve: The mitral valve is grossly normal. No evidence of mitral valve regurgitation. Tricuspid Valve: The tricuspid valve is grossly normal. Tricuspid valve regurgitation is trivial. Aortic Valve: The aortic valve is tricuspid. Aortic valve regurgitation is not visualized. Pulmonic Valve: The pulmonic valve was normal in structure. Pulmonic valve regurgitation is not visualized. Aorta: The aortic root and ascending aorta are structurally normal, with no evidence of dilitation. Venous: The inferior vena cava is normal in size with greater than 50% respiratory variability, suggesting right atrial pressure of 3 mmHg. IAS/Shunts: No atrial level shunt detected by color flow Doppler.  LEFT VENTRICLE PLAX 2D LVIDd:         3.60 cm         Diastology LVIDs:         2.70 cm         LV e' medial:    5.33 cm/s LV PW:         1.10 cm         LV E/e' medial:  9.7 LV IVS:        1.30 cm         LV e' lateral:   8.16 cm/s LVOT diam:     2.00 cm         LV E/e' lateral: 6.3 LV SV:         42 LV SV Index:   21              2D LVOT Area:     3.14 cm        Longitudinal                                Strain                                2D Strain GLS  -21.3 %                                (A2C):                                2D Strain GLS  -15.1 %                                (A3C):  2D Strain GLS  -20.8 %                                (A4C):                                2D Strain GLS  -19.1 %                                Avg:                                 3D Volume EF                                LV 3D EF:    Left                                             ventricul                                             ar                                             ejection                                             fraction                                             by 3D                                              volume is                                             61 %.                                 3D Volume EF:                                3D EF:        61 %  LV EDV:       120 ml                                LV ESV:       47 ml                                LV SV:        73 ml RIGHT VENTRICLE RV S prime:     12.00 cm/s TAPSE (M-mode): 1.8 cm LEFT ATRIUM             Index        RIGHT ATRIUM           Index LA diam:        3.10 cm 1.52 cm/m   RA Area:     10.50 cm LA Vol (A2C):   30.3 ml 14.90 ml/m  RA Volume:   18.70 ml  9.20 ml/m LA Vol (A4C):   38.5 ml 18.93 ml/m LA Biplane Vol: 31.0 ml 15.25 ml/m  AORTIC VALVE LVOT Vmax:   84.20 cm/s LVOT Vmean:  57.600 cm/s LVOT VTI:    0.135 m  AORTA Ao Root diam: 3.20 cm Ao Asc diam:  3.40 cm MITRAL VALVE               TRICUSPID VALVE MV Area (PHT): 3.40 cm    TR Peak grad:   8.8 mmHg MV Decel Time: 223 msec    TR Vmax:        148.00 cm/s MV E velocity: 51.80 cm/s MV A velocity: 72.00 cm/s  SHUNTS MV E/A ratio:  0.72        Systemic VTI:  0.14 m                            Systemic Diam: 2.00 cm Lyman Bishop MD Electronically signed by Lyman Bishop MD Signature Date/Time: 09/03/2021/10:27:44 AM    Final    MM LT RADIOACTIVE SEED LOC MAMMO GUIDE  Result Date: 08/22/2021 CLINICAL DATA:  Localization prior to surgery EXAM: MAMMOGRAPHIC GUIDED RADIOACTIVE SEED LOCALIZATION OF THE LEFT BREAST COMPARISON:  Previous exam(s). FINDINGS: Patient presents for radioactive seed localization prior to surgery. I met with the patient and we discussed the procedure of seed localization including benefits and alternatives. We discussed the high likelihood of a successful procedure. We discussed the risks of the procedure including infection, bleeding, tissue injury and further surgery. We discussed the low dose of radioactivity involved in the procedure. Informed, written consent was given. The usual time-out protocol was  performed immediately prior to the procedure. Using mammographic guidance, sterile technique, 1% lidocaine and an I-125 radioactive seed, site of known malignancy was localized using a superior approach. The follow-up mammogram images confirm the seed in the expected location and were marked for the surgeon. Follow-up survey of the patient confirms presence of the radioactive seed. Order number of I-125 seed:  [id number]. Total activity:  5.956 millicuries reference Date: August 11, 2021 The patient tolerated the procedure well and was released from the [place]. She was given instructions regarding seed removal. IMPRESSION: Radioactive seed localization left breast. No apparent complications. Electronically Signed   By: Dorise Bullion III M.D.   On: 08/22/2021 15:16  [place] FNA SOFT TISSUE  Result Date: 09/13/2021 INDICATION: 51 year old  with history of left breast lumpectomy and sentinel node biopsy. Patient has fluid collections in the left breast and left upper axilla. Concern for breast abscesses. EXAM: Ultrasound-guided aspiration of fluid collections in the left axilla and left breast MEDICATIONS: Local anesthetic ANESTHESIA/SEDATION: 1% lidocaine COMPLICATIONS: None immediate. PROCEDURE: Informed written consent was obtained from the patient after a thorough discussion of the procedural risks, benefits and alternatives. All questions were addressed. A timeout was performed prior to the initiation of the procedure. The left axilla and left breast were evaluated with ultrasound. These areas were prepped with chlorhexidine and sterile field was created. Skin was anesthetized in the left upper axilla. Using ultrasound guidance, Yueh catheter was directed into the fluid collection and initially clear yellow fluid was aspirated. The fluid became more thick and opaque as additional fluid was removed. Approximately 200 mL of fluid was removed from the left axillary fluid collection. Attention was directed to  the complex collections in the anterior left breast. This area was anesthetized with 1% lidocaine. Using ultrasound guidance, a new Yueh catheter was directed into these complex fluid collections and approximately 100 mL of opaque yellow fluid was removed. Majority of fluid was removed from these collections. FINDINGS: Large simple fluid collection in the left upper axilla. 200 mL of fluid was removed from this collection. Minimal residual fluid after aspiration. Slightly complex connecting fluid collections in the anterior left breast. 100 mL of yellow fluid was removed from these collections and majority of the fluid was removed at the end of the procedure. IMPRESSION: Ultrasound-guided aspiration of the large fluid collections in the left breast and left axilla. 300 mL of yellow opaque fluid was removed. Fluid was sent for culture. Electronically Signed   By: Markus Daft M.D.   On: 09/13/2021 17:28    Microbiology: Recent Results (from the past 240 hour(s))  Blood culture (routine x 2)     Status: None (Preliminary result)   Collection Time: 09/12/21 10:22 AM   Specimen: Right Antecubital; Blood  Result Value Ref Range Status   Specimen Description   Final    RIGHT ANTECUBITAL BLOOD Performed at [facility], [address] [place], [place] [address]    Special Requests   Final    Blood Culture adequate volume BOTTLES DRAWN AEROBIC AND ANAEROBIC Performed at [facility], [address] [place], [place] [address]    Culture   Final    NO GROWTH 2 DAYS Performed at [facility], [place] [address], [place], [place] [address]    Report Status PENDING  Incomplete  Resp Panel by RT-PCR (Flu A&B, Covid) Nasopharyngeal Swab     Status: None   Collection Time: 09/12/21 10:23 AM   Specimen: Nasopharyngeal Swab; Nasopharyngeal(NP) swabs in vial transport medium  Result Value Ref Range Status   SARS Coronavirus 2 by RT PCR NEGATIVE NEGATIVE Final    Comment:  (NOTE) SARS-CoV-2 target nucleic acids are NOT DETECTED.  The SARS-CoV-2 RNA is generally detectable in upper respiratory specimens during the acute phase of infection. The lowest concentration of SARS-CoV-2 viral copies this assay can detect is 138 copies/mL. A negative result does not preclude SARS-Cov-2 infection and should not be used as the sole basis for treatment or other patient management decisions. A negative result may occur with  improper specimen collection/handling, submission of specimen other than nasopharyngeal swab, presence of viral mutation(s) within the areas targeted by this assay, and inadequate number of viral copies(<138 copies/mL).  A negative result must be combined with clinical observations, patient history, and epidemiological information. The expected result is Negative.  Fact Sheet for Patients:  EntrepreneurPulse.com.au  Fact Sheet for Healthcare Providers:  IncredibleEmployment.be  This test is no t yet approved or cleared by the [place] FDA and  has been authorized for detection and/or diagnosis of SARS-CoV-2 by FDA under an Emergency Use Authorization (EUA). This EUA will remain  in effect (meaning this test can be used) for the duration of the COVID-19 declaration under Section 564(b)(1) of the Act, 21 U.S.C.section 360bbb-3(b)(1), unless the authorization is terminated  or revoked sooner.       Influenza A by PCR NEGATIVE NEGATIVE Final   Influenza B by PCR NEGATIVE NEGATIVE Final    Comment: (NOTE) The Xpert Xpress SARS-CoV-2/FLU/RSV plus assay is intended as an aid in the diagnosis of influenza from Nasopharyngeal swab specimens and should not be used as a sole basis for treatment. Nasal washings and aspirates are unacceptable for Xpert Xpress SARS-CoV-2/FLU/RSV testing.  Fact Sheet for Patients: EntrepreneurPulse.com.au  Fact Sheet for Healthcare  Providers: IncredibleEmployment.be  This test is not yet approved or cleared by the [place] FDA and has been authorized for detection and/or diagnosis of SARS-CoV-2 by FDA under an Emergency Use Authorization (EUA). This EUA will remain in effect (meaning this test can be used) for the duration of the COVID-19 declaration under Section 564(b)(1) of the Act, 21 U.S.C. section 360bbb-3(b)(1), unless the authorization is terminated or revoked.  Performed at [facility], [address] [place], [place] [address]   Blood culture (routine x 2)     Status: None (Preliminary result)   Collection Time: 09/12/21  6:38 PM   Specimen: BLOOD RIGHT HAND  Result Value Ref Range Status   Specimen Description BLOOD RIGHT HAND  Final   Special Requests   Final    BOTTLES DRAWN AEROBIC ONLY Blood Culture adequate volume   Culture   Final    NO GROWTH 2 DAYS Performed at [facility], [place] [address] [place], [place] [address]    Report Status PENDING  Incomplete  Aerobic/Anaerobic Culture w Gram Stain (surgical/deep wound)     Status: None (Preliminary result)   Collection Time: 09/13/21 11:42 AM   Specimen: Abscess  Result Value Ref Range Status   Specimen Description ABSCESS  Final   Special Requests LEFT AXILLA  Final   Gram Stain   Final    FEW WBC PRESENT,BOTH PMN AND MONONUCLEAR NO ORGANISMS SEEN    Culture   Final    MODERATE CITROBACTER KOSERI SUSCEPTIBILITIES TO FOLLOW Performed at [facility], [place] [address] [place], [place] [address]    Report Status PENDING  Incomplete  Aerobic/Anaerobic Culture w Gram Stain (surgical/deep wound)     Status: None (Preliminary result)   Collection Time: 09/13/21 11:43 AM   Specimen: Abscess  Result Value Ref Range Status   Specimen Description ABSCESS  Final   Special Requests LEFT ANTERIOR BREAST  Final   Gram Stain   Final    ABUNDANT WBC PRESENT,BOTH PMN AND MONONUCLEAR NO ORGANISMS  SEEN Performed at [facility], [address], [place], [place] [address]    Culture ABUNDANT CITROBACTER KOSERI  Final   Report Status PENDING  Incomplete  MRSA Next Gen by PCR, Nasal     Status: None   Collection Time: 09/13/21 12:20 PM   Specimen: Nasal Mucosa; Nasal Swab  Result Value  Ref Range Status   MRSA by PCR Next Gen NOT DETECTED NOT DETECTED Final    Comment: (NOTE) The GeneXpert MRSA Assay (FDA approved for NASAL specimens only), is one component of a comprehensive MRSA colonization surveillance program. It is not intended to diagnose MRSA infection nor to guide or monitor treatment for MRSA infections. Test performance is not FDA approved in patients less than 30 years old. Performed at [facility], [place] [address] [place], [place] [address]      Labs: Basic Metabolic Panel: Recent Labs  Lab 09/12/21 1023 09/12/21 1849 09/13/21 0123 09/14/21 0643  NA 133*  --  135 141  K 3.1*  --  3.4* 3.2*  CL 95*  --  104 108  CO2 23  --  18* 22  GLUCOSE 109*  --  181* 127*  BUN 20  --  8 10  CREATININE 0.95  --  0.83 0.72  CALCIUM 9.3  --  8.6* 9.0  MG  --  1.8  --  1.7   Liver Function Tests: Recent Labs  Lab 09/12/21 1023  AST 38  ALT 43  ALKPHOS 62  BILITOT 1.0  PROT 8.2*  ALBUMIN 3.7   Recent Labs  Lab 09/12/21 1023  LIPASE 21   No results for input(s): AMMONIA in the last 168 hours. CBC: Recent Labs  Lab 09/12/21 1023 09/13/21 0123 09/14/21 0643  WBC 15.4* 12.7* 11.4*  NEUTROABS 13.0*  --   --   HGB 11.3* 9.2* 8.4*  HCT 34.5* 28.8* 26.8*  MCV 96.9 98.3 99.6  PLT 399 442* 334   Cardiac Enzymes: No results for input(s): CKTOTAL, CKMB, CKMBINDEX, TROPONINI in the last 168 hours. BNP: BNP (last 3 results) No results for input(s): BNP in the last 8760 hours.  ProBNP (last 3 results) No results for input(s): PROBNP in the last 8760 hours.  CBG: Recent Labs  Lab 09/13/21 1743 09/13/21 2123 09/14/21 0818 09/14/21 1148   GLUCAP 93 124* 121* 119*       Signed:  Kayleen Memos, MD [facility] 09/14/2021, 1:56 PM

## 2021-09-14 NOTE — Progress Notes (Signed)
Subjective/Chief Complaint: Pt had aspiration yesterday  Serous fluid No complaints    Objective: Vital signs in last 24 hours: Temp:  [97.6 F (36.4 C)-98.8 F (37.1 C)] 98 F (36.7 C) (02/19 0817) Pulse Rate:  [90-104] 90 (02/19 0817) Resp:  [17-20] 18 (02/19 0817) BP: (111-132)/(63-85) 132/85 (02/19 0817) SpO2:  [95 %-100 %] 99 % (02/19 0819) Last BM Date : 09/13/21  Intake/Output from previous day: 02/18 0701 - 02/19 0700 In: 1337.3 [P.O.:760; I.V.:577.3] Out: -  Intake/Output this shift: No intake/output data recorded.  Left breast incision  intact no erythema less swollen   Lab Results:  Recent Labs    09/13/21 0123 09/14/21 0643  WBC 12.7* 11.4*  HGB 9.2* 8.4*  HCT 28.8* 26.8*  PLT 442* 334   BMET Recent Labs    09/13/21 0123 09/14/21 0643  NA 135 141  K 3.4* 3.2*  CL 104 108  CO2 18* 22  GLUCOSE 181* 127*  BUN 8 10  CREATININE 0.83 0.72  CALCIUM 8.6* 9.0   PT/INR No results for input(s): LABPROT, INR in the last 72 hours. ABG No results for input(s): PHART, HCO3 in the last 72 hours.  Invalid input(s): PCO2, PO2  Studies/Results: DG Chest 2 View  Result Date: 09/12/2021 CLINICAL DATA:  51 year old female with cough. EXAM: CHEST - 2 VIEW COMPARISON:  Chest radiographs 08/04/2021. FINDINGS: Stable right chest power port. Lower lung volumes. Mediastinal contours remain within normal limits. Both lungs remain clear. No pneumothorax or pleural effusion. Visualized tracheal air column is within normal limits. No acute osseous abnormality identified. Negative visible bowel gas. IMPRESSION: Lower lung volumes.  No acute cardiopulmonary abnormality. Electronically Signed   By: Genevie Ann M.D.   On: 09/12/2021 11:01   CT Head Wo Contrast  Result Date: 09/12/2021 CLINICAL DATA:  Headache. EXAM: CT HEAD WITHOUT CONTRAST TECHNIQUE: Contiguous axial images were obtained from the base of the skull through the vertex without intravenous contrast. RADIATION  DOSE REDUCTION: This exam was performed according to the departmental dose-optimization program which includes automated exposure control, adjustment of the mA and/or kV according to patient size and/or use of iterative reconstruction technique. COMPARISON:  None. FINDINGS: Brain: No evidence of acute infarction, hemorrhage, hydrocephalus, extra-axial collection or mass lesion/mass effect. Vascular: No hyperdense vessel or unexpected calcification. Skull: Normal. Negative for fracture or focal lesion. Sinuses/Orbits: No acute finding. Other: None. IMPRESSION: No acute intracranial abnormality seen. Electronically Signed   By: Marijo Conception M.D.   On: 09/12/2021 12:35   CT Angio Chest PE W and/or Wo Contrast  Result Date: 09/12/2021 CLINICAL DATA:  Cough, shortness of breath, tachycardia. EXAM: CT ANGIOGRAPHY CHEST WITH CONTRAST TECHNIQUE: Multidetector CT imaging of the chest was performed using the standard protocol during bolus administration of intravenous contrast. Multiplanar CT image reconstructions and MIPs were obtained to evaluate the vascular anatomy. RADIATION DOSE REDUCTION: This exam was performed according to the departmental dose-optimization program which includes automated exposure control, adjustment of the mA and/or kV according to patient size and/or use of iterative reconstruction technique. CONTRAST:  160mL OMNIPAQUE IOHEXOL 350 MG/ML SOLN COMPARISON:  None. FINDINGS: Cardiovascular: Preferential opacification of the thoracic aorta. No evidence of central pulmonary embolism. Evaluation of segmental and subsegmental pulmonary arteries is limited. No evidence of thoracic aortic aneurysm or dissection. Normal heart size. No pericardial effusion. Mediastinum/Nodes: No enlarged mediastinal, hilar, or axillary lymph nodes. Thyroid gland, trachea, and esophagus demonstrate no significant findings. Lungs/Pleura: Lungs are clear. No pleural effusion or pneumothorax.  Upper Abdomen: No acute  abnormality. Musculoskeletal: There are multiple fluid collections in the left axilla/anterior chest wall in the surgical bed with small focus of gas. The largest collection in the left axilla measures at least 6.8 x 5.4 x 8.6 cm. There is also skin thickening. Review of the MIP images confirms the above findings. IMPRESSION: 1. No evidence of central pulmonary embolism, evaluation of segmental and subsegmental pulmonary arteries is limited due to phase of contrast enhancement. 2. Lungs are clear without evidence of focal consolidation or pleural effusion. 3. Postsurgical changes in the left axilla/left anterior chest wall with multiple fluid collections, the largest measuring at least 6.8 x 5.4 x 8.6 cm. These likely postsurgical seroma/hematoma. Surgical consultation for further management would be helpful. Electronically Signed   By: Keane Police D.O.   On: 09/12/2021 12:48   CT ABDOMEN PELVIS W CONTRAST  Addendum Date: 09/12/2021   ADDENDUM REPORT: 09/12/2021 13:07 ADDENDUM: These results were called by telephone at the time of interpretation on 09/12/2021 at 1:07 pm to provider [facility] , who verbally acknowledged these results. Electronically Signed   By: Lorin Picket M.D.   On: 09/12/2021 13:07   Result Date: 09/12/2021 CLINICAL DATA:  Fatigue, diarrhea and abdominal pain. EXAM: CT ABDOMEN AND PELVIS WITH CONTRAST TECHNIQUE: Multidetector CT imaging of the abdomen and pelvis was performed using the standard protocol following bolus administration of intravenous contrast. RADIATION DOSE REDUCTION: This exam was performed according to the departmental dose-optimization program which includes automated exposure control, adjustment of the mA and/or kV according to patient size and/or use of iterative reconstruction technique. CONTRAST:  17mL OMNIPAQUE IOHEXOL 350 MG/ML SOLN COMPARISON:  01/29/2014. FINDINGS: Lower chest: Mild subsegmental atelectasis in the lower lobes. Heart is enlarged. No  pericardial or pleural effusion. Distal esophagus is grossly unremarkable. Hepatobiliary: Liver is slightly decreased in attenuation diffusely and is enlarged, measuring 21.8 cm. Cholecystectomy. No biliary ductal dilatation. Pancreas: Mildly hyperdense lesion in the uncinate process of the pancreas measures 1.6 x 1.7 cm, not definitely seen on 01/29/2014. No ductal dilatation or gland atrophy. Spleen: Negative. Adrenals/Urinary Tract: Adrenal glands and kidneys are unremarkable. Ureters are decompressed. Bladder is grossly unremarkable. Stomach/Bowel: Small hiatal hernia. Stomach, small bowel, appendix and colon are unremarkable. Vascular/Lymphatic: Atherosclerotic calcification of the aorta. No pathologically enlarged lymph nodes. Reproductive: Uterus is enlarged and contains heterogeneous masses, the largest of which measures 6.3 cm and contains calcification. No adnexal mass. Other: No free fluid.  Mesenteries and peritoneum are unremarkable. Musculoskeletal: There are 2 fluid collections in the left breast. The lateral most collection is partially visualized, has a peripherally hyperattenuating margin, measuring at least 5.3 x 8.3 cm. A localization clip is seen along the anteromedial margin. A second fluid collection with a small amount of air is seen centrally within the upper left breast, measuring 6.9 x 8.1 cm. Overlying skin thickening. No worrisome lytic or sclerotic lesions. IMPRESSION: 1. Two fluid collections in the left breast, status post radioactive seed localization 08/22/2021. The lateral collection is suspicious for an abscess. The more central collection may represent a seroma or an abscess. 2. Mildly hypodense lesion in the uncinate process of the pancreas, not definitely seen on 01/29/2014. Non emergent MR abdomen without and with contrast is recommended in further evaluation, as malignancy cannot be excluded. 3. Steatotic enlarged liver. 4. Enlarged fibroid uterus. Electronically Signed: By:  Lorin Picket M.D. On: 09/12/2021 12:49   [place] FNA SOFT TISSUE  Result Date: 09/13/2021 INDICATION: 51 year old with history of  left breast lumpectomy and sentinel node biopsy. Patient has fluid collections in the left breast and left upper axilla. Concern for breast abscesses. EXAM: Ultrasound-guided aspiration of fluid collections in the left axilla and left breast MEDICATIONS: Local anesthetic ANESTHESIA/SEDATION: 1% lidocaine COMPLICATIONS: None immediate. PROCEDURE: Informed written consent was obtained from the patient after a thorough discussion of the procedural risks, benefits and alternatives. All questions were addressed. A timeout was performed prior to the initiation of the procedure. The left axilla and left breast were evaluated with ultrasound. These areas were prepped with chlorhexidine and sterile field was created. Skin was anesthetized in the left upper axilla. Using ultrasound guidance, Yueh catheter was directed into the fluid collection and initially clear yellow fluid was aspirated. The fluid became more thick and opaque as additional fluid was removed. Approximately 200 mL of fluid was removed from the left axillary fluid collection. Attention was directed to the complex collections in the anterior left breast. This area was anesthetized with 1% lidocaine. Using ultrasound guidance, a new Yueh catheter was directed into these complex fluid collections and approximately 100 mL of opaque yellow fluid was removed. Majority of fluid was removed from these collections. FINDINGS: Large simple fluid collection in the left upper axilla. 200 mL of fluid was removed from this collection. Minimal residual fluid after aspiration. Slightly complex connecting fluid collections in the anterior left breast. 100 mL of yellow fluid was removed from these collections and majority of the fluid was removed at the end of the procedure. IMPRESSION: Ultrasound-guided aspiration of the large fluid collections in  the left breast and left axilla. 300 mL of yellow opaque fluid was removed. Fluid was sent for culture. Electronically Signed   By: Markus Daft M.D.   On: 09/13/2021 17:28    Anti-infectives: Anti-infectives (From admission, onward)    Start     Dose/Rate Route Frequency Ordered Stop   09/13/21 1400  vancomycin (VANCOREADY) IVPB 1500 mg/300 mL        1,500 mg 150 mL/hr over 120 Minutes Intravenous Every 24 hours 09/12/21 1345     09/13/21 1300  cefTRIAXone (ROCEPHIN) 2 g in sodium chloride 0.9 % 100 mL IVPB        2 g 200 mL/hr over 30 Minutes Intravenous Every 24 hours 09/12/21 2102     09/12/21 1500  vancomycin (VANCOCIN) IVPB 1000 mg/200 mL premix        1,000 mg 200 mL/hr over 60 Minutes Intravenous  Once 09/12/21 1345 09/12/21 1719   09/12/21 1345  vancomycin (VANCOCIN) IVPB 1000 mg/200 mL premix        1,000 mg 200 mL/hr over 60 Minutes Intravenous  Once 09/12/21 1341 09/12/21 1617   09/12/21 1345  cefTRIAXone (ROCEPHIN) 2 g in sodium chloride 0.9 % 100 mL IVPB        2 g 200 mL/hr over 30 Minutes Intravenous  Once 09/12/21 1341 09/12/21 1502       Assessment/Plan: S/p left breast lumpectomy   Ok for discharge  Recommend doxycycline 100 mg po bid for 7 days  Office to reach out to patient this week   LOS: 2 days    Turner Daniels MD  09/14/2021

## 2021-09-15 ENCOUNTER — Encounter: Payer: Self-pay | Admitting: Physical Therapy

## 2021-09-15 ENCOUNTER — Other Ambulatory Visit: Payer: Self-pay

## 2021-09-15 ENCOUNTER — Ambulatory Visit: Payer: Federal, State, Local not specified - PPO | Attending: General Surgery | Admitting: Physical Therapy

## 2021-09-15 DIAGNOSIS — Z170 Estrogen receptor positive status [ER+]: Secondary | ICD-10-CM | POA: Insufficient documentation

## 2021-09-15 DIAGNOSIS — R293 Abnormal posture: Secondary | ICD-10-CM | POA: Diagnosis not present

## 2021-09-15 DIAGNOSIS — C50412 Malignant neoplasm of upper-outer quadrant of left female breast: Secondary | ICD-10-CM | POA: Diagnosis not present

## 2021-09-15 DIAGNOSIS — Z483 Aftercare following surgery for neoplasm: Secondary | ICD-10-CM | POA: Insufficient documentation

## 2021-09-15 NOTE — Therapy (Signed)
[facility] [place], [place], [address] Phone: [phone]   Fax:  [phone]  Physical Therapy Treatment  Patient Details  Name: Joanna Osborn MRN: [id number] Date of Birth: [birth year] Referring Provider (PT): Dr. Rolm Bookbinder   Encounter Date: 09/15/2021   PT End of Session - 09/15/21 1350     Visit Number 2    Number of Visits 10    Date for PT Re-Evaluation 10/13/21    PT Start Time 1300    PT Stop Time 1343    PT Time Calculation (min) 43 min    Activity Tolerance Patient tolerated treatment well    Behavior During Therapy [facility] for tasks assessed/performed             Past Medical History:  Diagnosis Date   Anxiety    Breast cancer ([place])    Depression    Family history of colon cancer 03/19/2021   History of kidney stones    Hypertension     Past Surgical History:  Procedure Laterality Date   BREAST LUMPECTOMY WITH RADIOACTIVE SEED AND SENTINEL LYMPH NODE BIOPSY Left 08/25/2021   Procedure: LEFT BREAST LUMPECTOMY WITH RADIOACTIVE SEED AND AXILLARY SENTINEL LYMPH NODE BIOPSY;  Surgeon: Rolm Bookbinder, MD;  Location: [facility];  Service: General;  Laterality: Left;   CHOLECYSTECTOMY  2002   INSERTION OF MESH N/A 10/28/2016   Procedure: INSERTION OF MESH;  Surgeon: Clovis [name], MD;  Location: [facility];  Service: General;  Laterality: N/A;   PORTACATH PLACEMENT Right 04/02/2021   Procedure: INSERTION PORT-A-CATH;  Surgeon: Rolm Bookbinder, MD;  Location: [facility];  Service: General;  Laterality: Right;   [place] N/A 10/28/2016   Procedure: LAPAROSCOPIC VENTRAL HERNIA REPAIR;  Surgeon: Clovis [name], MD;  Location: [facility];  Service: General;  Laterality: N/A;   WRIST SURGERY      There were no vitals filed for this visit.   Subjective Assessment - 09/15/21 1258     Subjective Patient reports she underwent  neoadjuvant chemotherapy from 04/03/2021-07/24/2021. She had a left lumpectomy and sentinel node biopsy (2 negative nodes) on 08/25/2021. She will continue Herceptin and will begin radiation followed by anti-estrogen therapy. She reports her left breast became very swollen and painful and she had no energy so she went to the ED on 09/12/2021. She said they ran a bunch of tests and found nothing except they aspirated her breast. She was given an antibiotic and is currently taking that. She was kept overnight and discharged on 09/14/2021.    Pertinent History Patient was diagnosed on 03/05/2021 with left grade III invasive ductal carcinoma breast cancer. She underwent neoadjuvant chemotherapy from 04/03/2021-07/24/2021. She had a left lumpectomy and sentinel node biopsy (2 negative nodes) on 08/25/2021. It is triple positive with a Ki67 of 40%.    Patient Stated Goals See how my arm is doing    Currently in Pain? Yes    Pain Score 2     Pain Location Breast    Pain Orientation Left    Pain Descriptors / Indicators Tightness    Pain Type Surgical pain    Pain Onset 1 to 4 weeks ago    Pain Frequency Intermittent    Aggravating Factors  Nothing    Pain Relieving Factors Nothing                [facility] PT Assessment -  09/15/21 0001       Assessment   Medical Diagnosis s/p left lumpectomy and SLNB    Referring Provider (PT) Dr. Rolm Bookbinder    Onset Date/Surgical Date 08/25/21    Hand Dominance Right    Prior Therapy None      Precautions   Precautions Other (comment)    Precaution Comments active cancer      Restrictions   Weight Bearing Restrictions No      Balance Screen   Has the patient fallen in the past 6 months No    Has the patient had a decrease in activity level because of a fear of falling?  No    Is the patient reluctant to leave their home because of a fear of falling?  No      Home Environment   Living Environment Private residence    Living Arrangements  Spouse/significant other;Children   50 y.o. son   Available Help at Discharge Family      Prior Function   Level of Independence Independent    Vocation Full time employment   [place]; not working full time currently   Leisure She is not exercising      Cognition   Overall Cognitive Status Within Functional Limits for tasks assessed      Observation/Other Assessments   Observations redness and warmth present just superior to left areola. Hard seroma noted in left axilla around incision site.      Posture/Postural Control   Posture/Postural Control Postural limitations    Postural Limitations Rounded Shoulders;Forward head      ROM / Strength   AROM / PROM / Strength AROM      AROM   AROM Assessment Site Shoulder    Right/Left Shoulder Left    Left Shoulder Extension 43 Degrees    Left Shoulder Flexion 134 Degrees    Left Shoulder ABduction 138 Degrees    Left Shoulder Internal Rotation 64 Degrees    Left Shoulder External Rotation 89 Degrees               LYMPHEDEMA/ONCOLOGY QUESTIONNAIRE - 09/15/21 0001       Type   Cancer Type Left breast      Surgeries   Lumpectomy Date 08/25/21    Sentinel Lymph Node Biopsy Date 08/25/21    Number Lymph Nodes Removed 2      Treatment   Active Chemotherapy Treatment No    Past Chemotherapy Treatment Yes    Date 07/24/22    Active Radiation Treatment No    Past Radiation Treatment No    Current Hormone Treatment No    Past Hormone Therapy No      What other symptoms do you have   Are you Having Heaviness or Tightness Yes    Are you having Pain Yes    Are you having pitting edema No    Is it Hard or Difficult finding clothes that fit No    Do you have infections Yes    Comments Currently on antibiotics for left breast possible infection    Is there Decreased scar mobility Yes    [place] Sign No      Lymphedema Assessments   Lymphedema Assessments Upper extremities      Right Upper Extremity Lymphedema   10  cm Proximal to Olecranon Process 33.5 cm    Olecranon Process 27 cm    10 cm Proximal to Ulnar Styloid Process 23.2 cm    Just Proximal to Ulnar  Styloid Process 16 cm    Across Hand at [employer] 19.9 cm    At [place] of 2nd Digit 6.5 cm      Left Upper Extremity Lymphedema   10 cm Proximal to Olecranon Process 35 cm    Olecranon Process 26.5 cm    10 cm Proximal to Ulnar Styloid Process 21.9 cm    Just Proximal to Ulnar Styloid Process 15.5 cm    Across Hand at [employer] 19.2 cm    At [place] of 2nd Digit 6.1 cm                Quick Dash - 09/15/21 0001     Open a tight or new jar No difficulty    Do heavy household chores (wash walls, wash floors) No difficulty    Carry a shopping bag or briefcase No difficulty    Wash your back Mild difficulty    Use a knife to cut food No difficulty    Recreational activities in which you take some force or impact through your arm, shoulder, or hand (golf, hammering, tennis) No difficulty    During the past week, to what extent has your arm, shoulder or hand problem interfered with your normal social activities with family, friends, neighbors, or groups? Not at all    During the past week, to what extent has your arm, shoulder or hand problem limited your work or other regular daily activities Not at all    Arm, shoulder, or hand pain. Mild    Tingling (pins and needles) in your arm, shoulder, or hand None    Difficulty Sleeping No difficulty    DASH Score 4.55 %                             PT Education - 09/15/21 1341     Education Details Aftercare; HEP; walking program    Person(s) Educated Patient    Methods Explanation;Demonstration;Handout    Comprehension Returned demonstration;Verbalized understanding                 PT Long Term Goals - 09/15/21 1346       PT LONG TERM GOAL #1   Title Patient will demonstrate she has regained full shoulder ROM and function post operatively compared to  baselines.    Time 4    Period Weeks    Status On-going    Target Date 10/13/21      PT LONG TERM GOAL #2   Title Patient will increase left shoulder active flexion to >/= 150 degrees for increased ease reaching overhead.    Baseline 134 post op; 151 pre-op    Time 4    Period Weeks    Status New    Target Date 10/13/21      PT LONG TERM GOAL #3   Title Patient wil increase left shoulder active abduction to >/= 160 degrees to easily obtain radiation positioning.    Baseline 138 post op and 161 pre-op    Time 4    Period Weeks    Status New    Target Date 10/13/21      PT LONG TERM GOAL #4   Title Patient will improve her DASH score to be o (zero) for overall improved UE function.    Baseline 4.55 post op; 0 pre-op    Time 4    Period Weeks    Status New  Target Date 10/13/21      PT LONG TERM GOAL #5   Title Patient will report >/= 50% less pain and swelling in her left breast and axilla to tolerate wearing a bra.    Time 4    Period Weeks    Status New    Target Date 10/13/21                   Plan - 09/15/21 1351     Clinical Impression Statement Patient completed neoadjuvant chemotherapy on 07/24/2021 and had a left lumpectomy and sentinel node biopsy (2 negative nodes) on 08/25/2021. She was in the hospital for the past 3 days due to symptoms of feeling unwell and significant pain and swelling at her surgery site. She reports her breast was aspirated (300cc yelow fluid per her report) and was given antibiotics. She will benefit from PT to address edema, regain shoulder ROM, and improve scar tissue mobility. She is scheduled to begin radiation treatment soon.    Stability/Clinical Decision Making Stable/Uncomplicated    PT Frequency 2x / week    PT Duration 4 weeks    PT Treatment/Interventions ADLs/Self Care Home Management;Therapeutic exercise;Manual lymph drainage;Manual techniques;Patient/family education;Scar mobilization;Passive range of motion    PT  Next Visit Plan Begin PROM and ROM exercises; begin manual therapy after completion of antibiotics to reduce swelling and improve scar tissue mobility    PT Home Exercise Plan Post op HEP    Consulted and Agree with Plan of Care Patient             Patient will benefit from skilled therapeutic intervention in order to improve the following deficits and impairments:  Postural dysfunction, Decreased range of motion, Decreased knowledge of precautions, Increased edema, Increased fascial restricitons, Impaired UE functional use, Pain  Visit Diagnosis: Malignant neoplasm of upper-outer quadrant of left breast in female, estrogen receptor positive ([place])  Abnormal posture  Aftercare following surgery for neoplasm     Problem List Patient Active Problem List   Diagnosis Date Noted   Left breast abscess 09/12/2021   Essential hypertension 09/12/2021   Lesion of pancreas 09/12/2021   Hypokalemia 09/12/2021   Genetic testing 04/08/2021   Port-A-Cath in place 04/03/2021   Family history of colon cancer 03/19/2021   Malignant neoplasm of upper-outer quadrant of left breast in female, estrogen receptor positive ([place]) 03/13/2021   Suicide ideation 07/07/2013   Major depressive disorder, recurrent severe without psychotic features ([place]) 07/06/2013   Annia Friendly, PT 09/15/21 1:55 PM   [facility] @ [place], [place], [address] Phone: [phone]   Fax:  [phone]  Name: Joanna Osborn MRN: [id number] Date of Birth: August 25, 1970

## 2021-09-15 NOTE — Patient Instructions (Addendum)
° ° °   [facility]  [address]  [place] [address]  [phone]  After Breast Cancer Class It is recommended you attend the ABC class to be educated on lymphedema risk reduction. This class is free of charge and lasts for 1 hour. It is a 1-time class. You will need to download the Webex app either on your phone or computer. We will send you a link the night before or the morning of the class. You should be able to click on that link to join the class. This is not a confidential class. You don't have to turn your camera on, but other participants may be able to see your email address. You are scheduled for September 29, 2021 at 11:00.  Scar massage You can begin gentle scar massage to you incision sites. Gently place one hand on the incision and move the skin (without sliding on the skin) in various directions. Do this for a few minutes and then you can gently massage either coconut oil or vitamin E cream into the scars. YOU ARE NOT READY TO DO THIS YET BUT WE WILL WORK ON IT IN PT.  Compression garment You should continue wearing your compression bra until you feel like you no longer have swelling. You are encouraged to get the Hugger Prima to help with swelling.  Home exercise Program Continue doing the exercises you were given until you feel like you can do them without feeling any tightness at the end.   Walking Program Studies show that 30 minutes of walking per day (fast enough to elevate your heart rate) can significantly reduce the risk of a cancer recurrence. If you can't walk due to other medical reasons, we encourage you to find another activity you could do (like a stationary bike or water exercise).  Posture After breast cancer surgery, people frequently sit with rounded shoulders posture because it puts their incisions on slack and feels better. If you sit like this and scar tissue forms in that position, you can become very tight and have pain  sitting or standing with good posture. Try to be aware of your posture and sit and stand up tall to heal properly.  Follow up PT: It is recommended you return every 3 months for the first 3 years following surgery to be assessed on the SOZO machine for an L-Dex score. This helps prevent clinically significant lymphedema in 95% of patients. These follow up screens are 10 minute appointments that you are not billed for. You are scheduled for April 24th at 8:30.

## 2021-09-16 ENCOUNTER — Encounter: Payer: Federal, State, Local not specified - PPO | Admitting: Physical Therapy

## 2021-09-17 LAB — CULTURE, BLOOD (ROUTINE X 2)
Culture: NO GROWTH
Culture: NO GROWTH
Special Requests: ADEQUATE
Special Requests: ADEQUATE

## 2021-09-18 LAB — AEROBIC/ANAEROBIC CULTURE W GRAM STAIN (SURGICAL/DEEP WOUND)

## 2021-09-22 ENCOUNTER — Telehealth: Payer: Self-pay

## 2021-09-22 DIAGNOSIS — Z0000 Encounter for general adult medical examination without abnormal findings: Secondary | ICD-10-CM | POA: Diagnosis not present

## 2021-09-22 DIAGNOSIS — Z13220 Encounter for screening for lipoid disorders: Secondary | ICD-10-CM | POA: Diagnosis not present

## 2021-09-22 DIAGNOSIS — Z23 Encounter for immunization: Secondary | ICD-10-CM | POA: Diagnosis not present

## 2021-09-22 DIAGNOSIS — C50919 Malignant neoplasm of unspecified site of unspecified female breast: Secondary | ICD-10-CM | POA: Diagnosis not present

## 2021-09-22 DIAGNOSIS — I10 Essential (primary) hypertension: Secondary | ICD-10-CM | POA: Diagnosis not present

## 2021-09-22 NOTE — Telephone Encounter (Signed)
Spoke w/ patient's spouse "[name]", verified identity, and reminded him of patient's "Joanna Osborn's" 7:30am-09/25/21 in-person appointment w/ Shona Simpson PA-C. I left my extension [phone] in case patient needs anything. I advised patient to arrive 24min early (7:15am) for check-in. Mr. Ortloff verbalized understanding of information given.

## 2021-09-23 ENCOUNTER — Encounter: Payer: Self-pay | Admitting: Physical Therapy

## 2021-09-23 ENCOUNTER — Other Ambulatory Visit: Payer: Self-pay

## 2021-09-23 ENCOUNTER — Ambulatory Visit: Payer: Federal, State, Local not specified - PPO | Admitting: Physical Therapy

## 2021-09-23 DIAGNOSIS — C50412 Malignant neoplasm of upper-outer quadrant of left female breast: Secondary | ICD-10-CM

## 2021-09-23 DIAGNOSIS — R293 Abnormal posture: Secondary | ICD-10-CM | POA: Diagnosis not present

## 2021-09-23 DIAGNOSIS — Z483 Aftercare following surgery for neoplasm: Secondary | ICD-10-CM | POA: Diagnosis not present

## 2021-09-23 DIAGNOSIS — Z170 Estrogen receptor positive status [ER+]: Secondary | ICD-10-CM | POA: Diagnosis not present

## 2021-09-23 DIAGNOSIS — C50912 Malignant neoplasm of unspecified site of left female breast: Secondary | ICD-10-CM | POA: Diagnosis not present

## 2021-09-23 NOTE — Therapy (Signed)
[facility] @ [place], [place], [address] Phone: [phone]   Fax:  [phone]  Physical Therapy Treatment  Patient Details  Name: Joanna Osborn MRN: [id number] Date of Birth: 15-Jan-1971 Referring Provider (PT): Dr. Rolm Bookbinder   Encounter Date: 09/23/2021   PT End of Session - 09/23/21 0857     Visit Number 3    Number of Visits 10    Date for PT Re-Evaluation 10/13/21    PT Start Time 0804    PT Stop Time 3491    PT Time Calculation (min) 51 min    Activity Tolerance Patient tolerated treatment well    Behavior During Therapy [facility]  for tasks assessed/performed             Past Medical History:  Diagnosis Date   Anxiety    Breast cancer ([place])    Depression    Family history of colon cancer 03/19/2021   History of kidney stones    Hypertension     Past Surgical History:  Procedure Laterality Date   BREAST LUMPECTOMY WITH RADIOACTIVE SEED AND SENTINEL LYMPH NODE BIOPSY Left 08/25/2021   Procedure: LEFT BREAST LUMPECTOMY WITH RADIOACTIVE SEED AND AXILLARY SENTINEL LYMPH NODE BIOPSY;  Surgeon: Rolm Bookbinder, MD;  Location: [facility];  Service: General;  Laterality: Left;   CHOLECYSTECTOMY  2002   INSERTION OF MESH N/A 10/28/2016   Procedure: INSERTION OF MESH;  Surgeon: Clovis [name], MD;  Location: [facility];  Service: General;  Laterality: N/A;   PORTACATH PLACEMENT Right 04/02/2021   Procedure: INSERTION PORT-A-CATH;  Surgeon: Rolm Bookbinder, MD;  Location: [facility];  Service: General;  Laterality: Right;   [place] N/A 10/28/2016   Procedure: LAPAROSCOPIC VENTRAL HERNIA REPAIR;  Surgeon: Clovis [name], MD;  Location: [facility];  Service: General;  Laterality: N/A;   WRIST SURGERY      There were no vitals filed for this visit.   Subjective Assessment - 09/23/21 0805     Subjective I still have some swelling. I have  finished antiobiotics.I feel like my shoulder ROM is doing pretty good.    Pertinent History Patient was diagnosed on 03/05/2021 with left grade III invasive ductal carcinoma breast cancer. She underwent neoadjuvant chemotherapy from 04/03/2021-07/24/2021. She had a left lumpectomy and sentinel node biopsy (2 negative nodes) on 08/25/2021. It is triple positive with a Ki67 of 40%.    Patient Stated Goals See how my arm is doing    Currently in Pain? No/denies    Pain Score 0-No pain                [facility] PT Assessment - 09/23/21 0001       AROM   Left Shoulder Flexion 170 Degrees    Left Shoulder ABduction 176 Degrees                           [facility] Adult PT Treatment/Exercise - 09/23/21 0001       Manual Therapy   Manual Therapy Manual Lymphatic Drainage (MLD)    Manual Lymphatic Drainage (MLD) In supine: short neck, 5 diaphragmatic breaths, right axillary nodes and establishment of interaxillary pathway, left inguinal nodes and establishment of axillo inguinal pathway, L breast and axilla moving fluid towards pathways while instructing pt in anatomy and physiology of the lymphatic system and basic principles of MLD  PT Education - 09/23/21 0857     Education Details anatomy and physiology of the lymphatic system and basic principles of MLD    Person(s) Educated Patient    Methods Explanation    Comprehension Verbalized understanding                 PT Long Term Goals - 09/23/21 0807       PT LONG TERM GOAL #1   Title Patient will demonstrate she has regained full shoulder ROM and function post operatively compared to baselines.    Time 4    Period Weeks    Status Achieved      PT LONG TERM GOAL #2   Title Patient will increase left shoulder active flexion to >/= 150 degrees for increased ease reaching overhead.    Baseline 134 post op; 151 pre-op; 09/23/21- 170    Time 4    Period Weeks    Status Achieved      PT  LONG TERM GOAL #3   Title Patient wil increase left shoulder active abduction to >/= 160 degrees to easily obtain radiation positioning.    Baseline 138 post op and 161 pre-op; 09/23/21- 176    Time 4    Period Weeks    Status Achieved      PT LONG TERM GOAL #4   Title Patient will improve her DASH score to be o (zero) for overall improved UE function.    Baseline 4.55 post op; 0 pre-op    Time 4    Period Weeks    Status On-going      PT LONG TERM GOAL #5   Title Patient will report >/= 50% less pain and swelling in her left breast and axilla to tolerate wearing a bra.    Time 4    Period Weeks    Status On-going                   Plan - 09/23/21 1275     Clinical Impression Statement Pt returns to PT. She has completed antibiotics for her L breast after having it aspirated. There is still a palpable seroma in her left breast and axilla. Focused on MLD today to L breast and axilla in area of seromas with pt reporting improvements in discomfort and improved softening at end of session. She is going to pick up a compression bra today. Therapist sent inbasket message to Dr. Donne Hazel regarding redness just superior to incision. PIcture is under media section of her chart. Pt reports this has been there even during the antibiotics. Measured UE ROM and pt has returned to baseline with no limitations or tightness so she has met her ROM goals.    PT Frequency 2x / week    PT Duration 4 weeks    PT Treatment/Interventions ADLs/Self Care Home Management;Therapeutic exercise;Manual lymph drainage;Manual techniques;Patient/family education;Scar mobilization;Passive range of motion    PT Next Visit Plan MLD and scar massage to L breast and axilla reduce swelling and improve scar tissue mobility, create chip pack for compression bra, instruct in self MLD    PT Home Exercise Plan Post op HEP    Consulted and Agree with Plan of Care Patient             Patient will benefit from  skilled therapeutic intervention in order to improve the following deficits and impairments:  Postural dysfunction, Decreased range of motion, Decreased knowledge of precautions, Increased edema, Increased fascial restricitons, Impaired UE functional  use, Pain  Visit Diagnosis: Aftercare following surgery for neoplasm  Abnormal posture  Malignant neoplasm of upper-outer quadrant of left breast in female, estrogen receptor positive ([place])     Problem List Patient Active Problem List   Diagnosis Date Noted   Left breast abscess 09/12/2021   Essential hypertension 09/12/2021   Lesion of pancreas 09/12/2021   Hypokalemia 09/12/2021   Genetic testing 04/08/2021   Port-A-Cath in place 04/03/2021   Family history of colon cancer 03/19/2021   Malignant neoplasm of upper-outer quadrant of left breast in female, estrogen receptor positive ([place]) 03/13/2021   Suicide ideation 07/07/2013   Major depressive disorder, recurrent severe without psychotic features ([place]) 07/06/2013    Allyson Sabal [place], PT 09/23/2021, 9:03 AM  [facility] @ [place], [place], [address] Phone: [phone]   Fax:  [phone]  Name: Joanna Osborn MRN: [id number] Date of Birth: 1971/01/11

## 2021-09-25 ENCOUNTER — Inpatient Hospital Stay: Payer: Federal, State, Local not specified - PPO | Admitting: Hematology

## 2021-09-25 ENCOUNTER — Other Ambulatory Visit: Payer: Self-pay

## 2021-09-25 ENCOUNTER — Ambulatory Visit
Admission: RE | Admit: 2021-09-25 | Discharge: 2021-09-25 | Disposition: A | Discharge status: Home / Self Care | Payer: Federal, State, Local not specified - PPO | Source: Ambulatory Visit | Attending: Radiation Oncology | Admitting: Radiation Oncology

## 2021-09-25 ENCOUNTER — Inpatient Hospital Stay: Payer: Federal, State, Local not specified - PPO

## 2021-09-25 ENCOUNTER — Encounter: Payer: Self-pay | Admitting: Radiation Oncology

## 2021-09-25 VITALS — BP 132/103 | HR 93 | Temp 98.3°F | Resp 20 | Ht 65.0 in | Wt 209.0 lb

## 2021-09-25 VITALS — BP 132/100 | HR 95 | Temp 98.5°F | Resp 20

## 2021-09-25 DIAGNOSIS — R0602 Shortness of breath: Secondary | ICD-10-CM | POA: Diagnosis not present

## 2021-09-25 DIAGNOSIS — Z5112 Encounter for antineoplastic immunotherapy: Secondary | ICD-10-CM | POA: Insufficient documentation

## 2021-09-25 DIAGNOSIS — Z170 Estrogen receptor positive status [ER+]: Secondary | ICD-10-CM | POA: Insufficient documentation

## 2021-09-25 DIAGNOSIS — I119 Hypertensive heart disease without heart failure: Secondary | ICD-10-CM | POA: Diagnosis not present

## 2021-09-25 DIAGNOSIS — C50412 Malignant neoplasm of upper-outer quadrant of left female breast: Secondary | ICD-10-CM | POA: Insufficient documentation

## 2021-09-25 DIAGNOSIS — Z510 Encounter for antineoplastic radiation therapy: Secondary | ICD-10-CM | POA: Diagnosis not present

## 2021-09-25 DIAGNOSIS — R059 Cough, unspecified: Secondary | ICD-10-CM | POA: Insufficient documentation

## 2021-09-25 DIAGNOSIS — K449 Diaphragmatic hernia without obstruction or gangrene: Secondary | ICD-10-CM | POA: Insufficient documentation

## 2021-09-25 DIAGNOSIS — D259 Leiomyoma of uterus, unspecified: Secondary | ICD-10-CM | POA: Diagnosis not present

## 2021-09-25 DIAGNOSIS — Z87442 Personal history of urinary calculi: Secondary | ICD-10-CM | POA: Diagnosis not present

## 2021-09-25 DIAGNOSIS — Z79899 Other long term (current) drug therapy: Secondary | ICD-10-CM | POA: Diagnosis not present

## 2021-09-25 DIAGNOSIS — Z800 Family history of malignant neoplasm of digestive organs: Secondary | ICD-10-CM | POA: Diagnosis not present

## 2021-09-25 DIAGNOSIS — I10 Essential (primary) hypertension: Secondary | ICD-10-CM | POA: Diagnosis not present

## 2021-09-25 DIAGNOSIS — R000 Tachycardia, unspecified: Secondary | ICD-10-CM | POA: Diagnosis not present

## 2021-09-25 MED ORDER — ACETAMINOPHEN 325 MG PO TABS
650.0000 mg | ORAL_TABLET | Freq: Once | ORAL | Status: AC
  Administered 2021-09-25: 650 mg via ORAL
  Filled 2021-09-25: qty 2

## 2021-09-25 MED ORDER — TRASTUZUMAB-HYALURONIDASE-OYSK 600-10000 MG-UNT/5ML ~~LOC~~ SOLN
600.0000 mg | Freq: Once | SUBCUTANEOUS | Status: AC
  Administered 2021-09-25: 600 mg via SUBCUTANEOUS
  Filled 2021-09-25: qty 5

## 2021-09-25 MED ORDER — DIPHENHYDRAMINE HCL 25 MG PO CAPS
50.0000 mg | ORAL_CAPSULE | Freq: Once | ORAL | Status: AC
  Administered 2021-09-25: 50 mg via ORAL
  Filled 2021-09-25: qty 2

## 2021-09-25 NOTE — Progress Notes (Signed)
Pt observed for 30 minutes post Herceptin SQ injection. VSS. No complications. Injection site normal without redness or swelling upon discharge.  ?

## 2021-09-25 NOTE — Progress Notes (Addendum)
Spoke w/ patient, verified identity, and began nursing interview. Patient is doing well. Repots no issues at this time. ? ?Meaningful use complete. ?Tubal ligation- NO chances of pregnancy. ? ?BP (!) 132/103 (BP Location: Right Arm, Patient Position: Sitting, Cuff Size: Large)   Pulse 93   Temp 98.3 ?F (36.8 ?C) (Oral)   Resp 20   Ht 5\' 5"  (1.651 m)   Wt 209 lb (94.8 kg)   SpO2 100%   BMI 34.78 kg/m?  ? ?

## 2021-09-25 NOTE — Progress Notes (Signed)
Radiation Oncology         [phone] ________________________________  Name: Joanna Osborn        MRN: [id number]  Date of Service: 09/25/2021 DOB: 03-24-1971  GB:[id number], [place]ks, [place]  Nicholas Lose, MD     REFERRING PHYSICIAN: Nicholas Lose, MD   DIAGNOSIS: The encounter diagnosis was Malignant neoplasm of upper-outer quadrant of left breast in female, estrogen receptor positive ([place]).   HISTORY OF PRESENT ILLNESS: Joanna Osborn is a 51 y.o. female originally seen in the multidisciplinary breast clinic for a new diagnosis of left breast cancer. The patient was noted to have a palapable mass for one week in the left breast. Diagnostic imaging revealed a 2.1 cm mass in the 12:00 position. By ultrasound this measured 2.1 cm as well. The axilla was negative for adenopathy. 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   Since her last visit, she had an MRI on 03/26/2021 that showed the known biopsy-proven area in the left upper breast, 1 abnormal lymph node was seen in the axilla, and an indeterminate 8 mm breast mass in the right breast was noted. She underwent an ultrasound that showed no evidence of thickening of a 3 mm axillary lymph node, and no sonographic correlate to the breast mass in the right.  She ultimately underwent a biopsy of the right breast finding on MRI scan that showed a complex sclerosing lesion.  She then went on to receive systemic chemotherapy between 04/04/2021 and 07/24/2021.  Posttreatment imaging with MRI on 07/25/2021 showed the known area in the right breast at 6:00 measuring 8 mm, but complete imaging response to the left breast and no evidence of adenopathy.  A second biopsy in the right breast at the 6:30 position was consistent with fibroadenoma.  She underwent left lumpectomy on 08/25/2021 and no evidence of residual disease was seen, additional margins were also negative for disease, and 2 sampled  lymph nodes were negative.  She continues with Herceptin, and she was treated for a postoperative seroma with aspiration and antibiotics for cellulitis.  She has been working with physical therapy on range of motion.  She is seen today to review adjuvant radiotherapy for the left breast.  PREVIOUS RADIATION THERAPY: No   PAST MEDICAL HISTORY:  Past Medical History:  Diagnosis Date   Anxiety    Breast cancer ([place])    Depression    Family history of colon cancer 03/19/2021   History of kidney stones    Hypertension        PAST SURGICAL HISTORY: Past Surgical History:  Procedure Laterality Date   BREAST LUMPECTOMY WITH RADIOACTIVE SEED AND SENTINEL LYMPH NODE BIOPSY Left 08/25/2021   Procedure: LEFT BREAST LUMPECTOMY WITH RADIOACTIVE SEED AND AXILLARY SENTINEL LYMPH NODE BIOPSY;  Surgeon: Rolm Bookbinder, MD;  Location: [facility];  Service: General;  Laterality: Left;   CHOLECYSTECTOMY  2002   INSERTION OF MESH N/A 10/28/2016   Procedure: INSERTION OF MESH;  Surgeon: Clovis [name], MD;  Location: [facility];  Service: General;  Laterality: N/A;   PORTACATH PLACEMENT Right 04/02/2021   Procedure: INSERTION PORT-A-CATH;  Surgeon: Rolm Bookbinder, MD;  Location: [facility];  Service: General;  Laterality: Right;   [place] N/A 10/28/2016   Procedure: LAPAROSCOPIC VENTRAL HERNIA REPAIR;  Surgeon: Clovis [name], MD;  Location: [facility];  Service:  General;  Laterality: N/A;   WRIST SURGERY       FAMILY HISTORY:  Family History  Problem Relation Age of Onset   Kidney disease Mother    Cancer Paternal Aunt        unknown type; dx after 20   Colon cancer Paternal Grandmother        dx unknown age   Colon cancer Paternal Grandfather        dx after 63   Hypertension Other    Cancer Other    Cancer Cousin        unknown type; dx before 56     SOCIAL HISTORY:  reports that she has never smoked. She has never used  smokeless tobacco. She reports current alcohol use of about 1.0 standard drink per week. She reports that she does not use drugs.The patient is married and lives in [place]. She has a 18 year old grandson she sees quite often. 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   ALLERGIES: Endocet [oxycodone-acetaminophen], No known allergies, and Tape   MEDICATIONS:  Current Outpatient Medications  Medication Sig Dispense Refill   acetaminophen (TYLENOL) 325 MG tablet Take 650 mg by mouth every 6 (six) hours as needed for moderate pain.     benzonatate (TESSALON) 200 MG capsule Take 1 capsule (200 mg total) by mouth 3 (three) times daily. 20 capsule 0   ergocalciferol (VITAMIN D2) 1.25 MG (50000 UT) capsule Take 1 capsule by mouth every Tuesday.     HYDROcodone-acetaminophen (NORCO) 10-325 MG tablet Take 1 tablet by mouth every 6 (six) hours as needed. 10 tablet 0   polyethylene glycol (MIRALAX) 17 g packet Take 17 g by mouth daily as needed. 14 each 0   potassium chloride SA (KLOR-CON M) 20 MEQ tablet Take 2 tablets (40 mEq total) by mouth daily for 5 days. 10 tablet 0   traMADol (ULTRAM) 50 MG tablet Take 100 mg by mouth every 6 (six) hours as needed for pain.     No current facility-administered medications for this encounter.     REVIEW OF SYSTEMS: On review of systems, the patient reports that she is doing well overall. She denies any breast pain now but had fullness and pain in her left axillary incision site with her prior seroma. No other complaints are noted.      PHYSICAL EXAM:  Wt Readings from Last 3 Encounters:  09/05/21 211 lb 8 oz (95.9 kg)  08/25/21 213 lb 6.5 oz (96.8 kg)  08/14/21 210 lb 11.2 oz (95.6 kg)   Temp Readings from Last 3 Encounters:  09/14/21 98 F (36.7 C) (Oral)  09/12/21 (!) 101.2 F (38.4 C) (Oral)  09/05/21 98.7 F (37.1 C) (Oral)   BP Readings from Last 3 Encounters:  09/14/21 132/85  09/12/21 111/76  09/05/21 125/89    Pulse Readings from Last 3 Encounters:  09/14/21 90  09/12/21 (!) 112  09/05/21 86    In general this is a well appearing African American female in no acute distress. She's alert and oriented x4 and appropriate throughout the examination. Cardiopulmonary assessment is negative for acute distress and she exhibits normal effort.  Her left breast revealed a well-healed lumpectomy and axillary incision site without erythema separation or drainage.  No fluctuance is noted, but there is mild induration deep to both incision sites.     ECOG = 0  0 - Asymptomatic (Fully active, able to carry on all predisease activities  without restriction)  1 - Symptomatic but completely ambulatory (Restricted in physically strenuous activity but ambulatory and able to carry out work of a light or sedentary nature. For example, light housework, office work)  2 - Symptomatic, <50% in bed during the day (Ambulatory and capable of all self care but unable to carry out any work activities. Up and about more than 50% of waking hours)  3 - Symptomatic, >50% in bed, but not bedbound (Capable of only limited self-care, confined to bed or chair 50% or more of waking hours)  4 - Bedbound (Completely disabled. Cannot carry on any self-care. Totally confined to bed or chair)  5 - Death   Eustace Pen MM, Creech RH, Tormey DC, et al. [phone]). "Toxicity and response criteria of the [facility]". [place] Oncol. 5 (6): 649-55    LABORATORY DATA:  Lab Results  Component Value Date   WBC 11.4 (H) 09/14/2021   HGB 8.4 (L) 09/14/2021   HCT 26.8 (L) 09/14/2021   MCV 99.6 09/14/2021   PLT 334 09/14/2021   Lab Results  Component Value Date   NA 141 09/14/2021   K 3.2 (L) 09/14/2021   CL 108 09/14/2021   CO2 22 09/14/2021   Lab Results  Component Value Date   ALT 43 09/12/2021   AST 38 09/12/2021   ALKPHOS 62 09/12/2021   BILITOT 1.0 09/12/2021      RADIOGRAPHY: DG Chest 2 View  Result  Date: 09/12/2021 CLINICAL DATA:  51 year old female with cough. EXAM: CHEST - 2 VIEW COMPARISON:  Chest radiographs 08/04/2021. FINDINGS: Stable right chest power port. Lower lung volumes. Mediastinal contours remain within normal limits. Both lungs remain clear. No pneumothorax or pleural effusion. Visualized tracheal air column is within normal limits. No acute osseous abnormality identified. Negative visible bowel gas. IMPRESSION: Lower lung volumes.  No acute cardiopulmonary abnormality. Electronically Signed   By: Genevie Ann M.D.   On: 09/12/2021 11:01   CT Head Wo Contrast  Result Date: 09/12/2021 CLINICAL DATA:  Headache. EXAM: CT HEAD WITHOUT CONTRAST TECHNIQUE: Contiguous axial images were obtained from the base of the skull through the vertex without intravenous contrast. RADIATION DOSE REDUCTION: This exam was performed according to the departmental dose-optimization program which includes automated exposure control, adjustment of the mA and/or kV according to patient size and/or use of iterative reconstruction technique. COMPARISON:  None. FINDINGS: Brain: No evidence of acute infarction, hemorrhage, hydrocephalus, extra-axial collection or mass lesion/mass effect. Vascular: No hyperdense vessel or unexpected calcification. Skull: Normal. Negative for fracture or focal lesion. Sinuses/Orbits: No acute finding. Other: None. IMPRESSION: No acute intracranial abnormality seen. Electronically Signed   By: Marijo Conception M.D.   On: 09/12/2021 12:35   CT Angio Chest PE W and/or Wo Contrast  Result Date: 09/12/2021 CLINICAL DATA:  Cough, shortness of breath, tachycardia. EXAM: CT ANGIOGRAPHY CHEST WITH CONTRAST TECHNIQUE: Multidetector CT imaging of the chest was performed using the standard protocol during bolus administration of intravenous contrast. Multiplanar CT image reconstructions and MIPs were obtained to evaluate the vascular anatomy. RADIATION DOSE REDUCTION: This exam was performed according  to the departmental dose-optimization program which includes automated exposure control, adjustment of the mA and/or kV according to patient size and/or use of iterative reconstruction technique. CONTRAST:  146mL OMNIPAQUE IOHEXOL 350 MG/ML SOLN COMPARISON:  None. FINDINGS: Cardiovascular: Preferential opacification of the thoracic aorta. No evidence of central pulmonary embolism. Evaluation of segmental and subsegmental pulmonary arteries is limited. No evidence of thoracic  aortic aneurysm or dissection. Normal heart size. No pericardial effusion. Mediastinum/Nodes: No enlarged mediastinal, hilar, or axillary lymph nodes. Thyroid gland, trachea, and esophagus demonstrate no significant findings. Lungs/Pleura: Lungs are clear. No pleural effusion or pneumothorax. Upper Abdomen: No acute abnormality. Musculoskeletal: There are multiple fluid collections in the left axilla/anterior chest wall in the surgical bed with small focus of gas. The largest collection in the left axilla measures at least 6.8 x 5.4 x 8.6 cm. There is also skin thickening. Review of the MIP images confirms the above findings. IMPRESSION: 1. No evidence of central pulmonary embolism, evaluation of segmental and subsegmental pulmonary arteries is limited due to phase of contrast enhancement. 2. Lungs are clear without evidence of focal consolidation or pleural effusion. 3. Postsurgical changes in the left axilla/left anterior chest wall with multiple fluid collections, the largest measuring at least 6.8 x 5.4 x 8.6 cm. These likely postsurgical seroma/hematoma. Surgical consultation for further management would be helpful. Electronically Signed   By: Keane Police D.O.   On: 09/12/2021 12:48   CT ABDOMEN PELVIS W CONTRAST  Addendum Date: 09/12/2021   ADDENDUM REPORT: 09/12/2021 13:07 ADDENDUM: These results were called by telephone at the time of interpretation on 09/12/2021 at 1:07 pm to provider [facility] , who verbally acknowledged  these results. Electronically Signed   By: Lorin Picket M.D.   On: 09/12/2021 13:07   Result Date: 09/12/2021 CLINICAL DATA:  Fatigue, diarrhea and abdominal pain. EXAM: CT ABDOMEN AND PELVIS WITH CONTRAST TECHNIQUE: Multidetector CT imaging of the abdomen and pelvis was performed using the standard protocol following bolus administration of intravenous contrast. RADIATION DOSE REDUCTION: This exam was performed according to the departmental dose-optimization program which includes automated exposure control, adjustment of the mA and/or kV according to patient size and/or use of iterative reconstruction technique. CONTRAST:  173mL OMNIPAQUE IOHEXOL 350 MG/ML SOLN COMPARISON:  01/29/2014. FINDINGS: Lower chest: Mild subsegmental atelectasis in the lower lobes. Heart is enlarged. No pericardial or pleural effusion. Distal esophagus is grossly unremarkable. Hepatobiliary: Liver is slightly decreased in attenuation diffusely and is enlarged, measuring 21.8 cm. Cholecystectomy. No biliary ductal dilatation. Pancreas: Mildly hyperdense lesion in the uncinate process of the pancreas measures 1.6 x 1.7 cm, not definitely seen on 01/29/2014. No ductal dilatation or gland atrophy. Spleen: Negative. Adrenals/Urinary Tract: Adrenal glands and kidneys are unremarkable. Ureters are decompressed. Bladder is grossly unremarkable. Stomach/Bowel: Small hiatal hernia. Stomach, small bowel, appendix and colon are unremarkable. Vascular/Lymphatic: Atherosclerotic calcification of the aorta. No pathologically enlarged lymph nodes. Reproductive: Uterus is enlarged and contains heterogeneous masses, the largest of which measures 6.3 cm and contains calcification. No adnexal mass. Other: No free fluid.  Mesenteries and peritoneum are unremarkable. Musculoskeletal: There are 2 fluid collections in the left breast. The lateral most collection is partially visualized, has a peripherally hyperattenuating margin, measuring at least 5.3 x  8.3 cm. A localization clip is seen along the anteromedial margin. A second fluid collection with a small amount of air is seen centrally within the upper left breast, measuring 6.9 x 8.1 cm. Overlying skin thickening. No worrisome lytic or sclerotic lesions. IMPRESSION: 1. Two fluid collections in the left breast, status post radioactive seed localization 08/22/2021. The lateral collection is suspicious for an abscess. The more central collection may represent a seroma or an abscess. 2. Mildly hypodense lesion in the uncinate process of the pancreas, not definitely seen on 01/29/2014. Non emergent MR abdomen without and with contrast is recommended in further evaluation, as malignancy  cannot be excluded. 3. Steatotic enlarged liver. 4. Enlarged fibroid uterus. Electronically Signed: By: Lorin Picket M.D. On: 09/12/2021 12:49   ECHOCARDIOGRAM COMPLETE  Result Date: 09/03/2021    ECHOCARDIOGRAM REPORT   Patient Name:   CAELEIGH PROHASKA Date of Exam: 09/03/2021 Medical Rec #:  [id number]    Height:       65.0 in Accession #:    [id number]   Weight:       213.4 lb Date of Birth:  1971-02-26   BSA:          2.033 m Patient Age:    68 years     BP:           127/90 mmHg Patient Gender: F            HR:           96 bpm. Exam Location:  Outpatient Procedure: 2D Echo, 3D Echo, Cardiac Doppler, Color Doppler and Strain Analysis Indications:    Chemo Z09  History:        Patient has prior history of Echocardiogram examinations, most                 recent 04/02/2021. Risk Factors:Hypertension. Breast Cancer.  Sonographer:    Darlina Sicilian RDCS Referring Phys: [id number] [place]  1. Left ventricular ejection fraction, by estimation, is 60 to 65%. Left ventricular ejection fraction by 3D volume is 61 %. The left ventricle has normal function. The left ventricle has no regional wall motion abnormalities. There is mild left ventricular hypertrophy. Left ventricular diastolic parameters are consistent with Grade I  diastolic dysfunction (impaired relaxation). The average left ventricular global longitudinal strain is -19.1 %. The global longitudinal strain is normal.  2. Right ventricular systolic function is normal. The right ventricular size is normal. There is normal pulmonary artery systolic pressure.  3. The mitral valve is grossly normal. No evidence of mitral valve regurgitation.  4. The aortic valve is tricuspid. Aortic valve regurgitation is not visualized.  5. The inferior vena cava is normal in size with greater than 50% respiratory variability, suggesting right atrial pressure of 3 mmHg. Comparison(s): No significant change from prior study. 04/02/2021: LVEF 60-65%, grade 1 DD, GLS -21%. FINDINGS  Left Ventricle: Left ventricular ejection fraction, by estimation, is 60 to 65%. Left ventricular ejection fraction by 3D volume is 61 %. The left ventricle has normal function. The left ventricle has no regional wall motion abnormalities. The average left ventricular global longitudinal strain is -19.1 %. The global longitudinal strain is normal. The left ventricular internal cavity size was normal in size. There is mild left ventricular hypertrophy. Left ventricular diastolic parameters are consistent with Grade I diastolic dysfunction (impaired relaxation). Indeterminate filling pressures. Right Ventricle: The right ventricular size is normal. No increase in right ventricular wall thickness. Right ventricular systolic function is normal. There is normal pulmonary artery systolic pressure. The tricuspid regurgitant velocity is 1.48 m/s, and  with an assumed right atrial pressure of 3 mmHg, the estimated right ventricular systolic pressure is 25.3 mmHg. Left Atrium: Left atrial size was normal in size. Right Atrium: Right atrial size was normal in size. Pericardium: There is no evidence of pericardial effusion. Mitral Valve: The mitral valve is grossly normal. No evidence of mitral valve regurgitation. Tricuspid Valve: The  tricuspid valve is grossly normal. Tricuspid valve regurgitation is trivial. Aortic Valve: The aortic valve is tricuspid. Aortic valve regurgitation is not visualized. Pulmonic Valve: The pulmonic valve was  normal in structure. Pulmonic valve regurgitation is not visualized. Aorta: The aortic root and ascending aorta are structurally normal, with no evidence of dilitation. Venous: The inferior vena cava is normal in size with greater than 50% respiratory variability, suggesting right atrial pressure of 3 mmHg. IAS/Shunts: No atrial level shunt detected by color flow Doppler.  LEFT VENTRICLE PLAX 2D LVIDd:         3.60 cm         Diastology LVIDs:         2.70 cm         LV e' medial:    5.33 cm/s LV PW:         1.10 cm         LV E/e' medial:  9.7 LV IVS:        1.30 cm         LV e' lateral:   8.16 cm/s LVOT diam:     2.00 cm         LV E/e' lateral: 6.3 LV SV:         42 LV SV Index:   21              2D LVOT Area:     3.14 cm        Longitudinal                                Strain                                2D Strain GLS  -21.3 %                                (A2C):                                2D Strain GLS  -15.1 %                                (A3C):                                2D Strain GLS  -20.8 %                                (A4C):                                2D Strain GLS  -19.1 %                                Avg:                                 3D Volume EF  LV 3D EF:    Left                                             ventricul                                             ar                                             ejection                                             fraction                                             by 3D                                             volume is                                             61 %.                                 3D Volume EF:                                3D EF:        61 %                                 LV EDV:       120 ml                                LV ESV:       47 ml                                LV SV:        73 ml RIGHT VENTRICLE RV S prime:     12.00 cm/s TAPSE (M-mode): 1.8 cm LEFT ATRIUM             Index        RIGHT ATRIUM           Index LA diam:        3.10 cm 1.52 cm/m   RA Area:     10.50 cm  LA Vol (A2C):   30.3 ml 14.90 ml/m  RA Volume:   18.70 ml  9.20 ml/m LA Vol (A4C):   38.5 ml 18.93 ml/m LA Biplane Vol: 31.0 ml 15.25 ml/m  AORTIC VALVE LVOT Vmax:   84.20 cm/s LVOT Vmean:  57.600 cm/s LVOT VTI:    0.135 m  AORTA Ao Root diam: 3.20 cm Ao Asc diam:  3.40 cm MITRAL VALVE               TRICUSPID VALVE MV Area (PHT): 3.40 cm    TR Peak grad:   8.8 mmHg MV Decel Time: 223 msec    TR Vmax:        148.00 cm/s MV E velocity: 51.80 cm/s MV A velocity: 72.00 cm/s  SHUNTS MV E/A ratio:  0.72        Systemic VTI:  0.14 m                            Systemic Diam: 2.00 cm Lyman Bishop MD Electronically signed by Lyman Bishop MD Signature Date/Time: 09/03/2021/10:27:44 AM    Final    [place] FNA SOFT TISSUE  Result Date: 09/13/2021 INDICATION: 51 year old with history of left breast lumpectomy and sentinel node biopsy. Patient has fluid collections in the left breast and left upper axilla. Concern for breast abscesses. EXAM: Ultrasound-guided aspiration of fluid collections in the left axilla and left breast MEDICATIONS: Local anesthetic ANESTHESIA/SEDATION: 1% lidocaine COMPLICATIONS: None immediate. PROCEDURE: Informed written consent was obtained from the patient after a thorough discussion of the procedural risks, benefits and alternatives. All questions were addressed. A timeout was performed prior to the initiation of the procedure. The left axilla and left breast were evaluated with ultrasound. These areas were prepped with chlorhexidine and sterile field was created. Skin was anesthetized in the left upper axilla. Using ultrasound guidance, Yueh catheter was directed into the fluid collection  and initially clear yellow fluid was aspirated. The fluid became more thick and opaque as additional fluid was removed. Approximately 200 mL of fluid was removed from the left axillary fluid collection. Attention was directed to the complex collections in the anterior left breast. This area was anesthetized with 1% lidocaine. Using ultrasound guidance, a new Yueh catheter was directed into these complex fluid collections and approximately 100 mL of opaque yellow fluid was removed. Majority of fluid was removed from these collections. FINDINGS: Large simple fluid collection in the left upper axilla. 200 mL of fluid was removed from this collection. Minimal residual fluid after aspiration. Slightly complex connecting fluid collections in the anterior left breast. 100 mL of yellow fluid was removed from these collections and majority of the fluid was removed at the end of the procedure. IMPRESSION: Ultrasound-guided aspiration of the large fluid collections in the left breast and left axilla. 300 mL of yellow opaque fluid was removed. Fluid was sent for culture. Electronically Signed   By: Markus Daft M.D.   On: 09/13/2021 17:28       IMPRESSION/PLAN: 1. 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  Dr. Lisbeth Renshaw has reviewed her case and I reviewed her final pathology findings and  the nature of left breast disease.  She has completed chemotherapy and will continue Herceptin, she has done well since surgery and is now ready to proceed with radiotherapy.  We reviewed  the rationale for external radiotherapy to  the breast  to reduce risks of local recurrence followed by antiestrogen therapy. We discussed the risks, benefits, short, and long term effects of radiotherapy, as well as the curative intent, and the patient is interested in proceeding. Dr. Lisbeth Renshaw discusses I also reviewed the delivery and logistics of radiotherapy.  Dr.  Lisbeth Renshaw recommends 4-6 1/2 weeks of radiotherapy with deep inspiration breath hold technique. We reviewed the rationale for both regimens and after considering both options, she is motivated for the 6 1/2 week course with deep inspiration breath hold technique Written consent is obtained and placed in the chart, a copy was provided to the patient. She will simulate today  2.  Contraceptive Counseling. The patient has had a tubal ligation and does not need pregnancy testing prior to simulation or radiotherapy.  3. Right breast CSL. She plans to follow this closely with Dr. Donne Hazel. We will follow this expectantly.   In a visit lasting 45 minutes, greater than 50% of the time was spent face to face reviewing her case, as well as in preparation of, discussing, and coordinating the patient's care.     Carola Rhine, [facility]    **Disclaimer: This note was dictated with voice recognition software. Similar sounding words can inadvertently be transcribed and this note may contain transcription errors which may not have been corrected upon publication of note.**

## 2021-09-25 NOTE — Patient Instructions (Signed)
Trastuzumab; Hyaluronidase injection ?What is this medication? ?TRASTUZUMAB; HYALURONIDASE (tras TOO zoo mab / hye al ur ON i dase) is used to treat breast cancer and stomach cancer. Trastuzumab is a monoclonal antibody. Hyaluronidase is used to improve the effects of trastuzumab. ?This medicine may be used for other purposes; ask your health care provider or pharmacist if you have questions. ?COMMON BRAND NAME(S): HERCEPTIN HYLECTA ?What should I tell my care team before I take this medication? ?They need to know if you have any of these conditions: ?heart disease ?heart failure ?lung or breathing disease, like asthma ?an unusual or allergic reaction to trastuzumab, or other medications, foods, dyes, or preservatives ?pregnant or trying to get pregnant ?breast-feeding ?How should I use this medication? ?This medicine is for injection under the skin. It is given by a health care professional in a hospital or clinic setting. ?Talk to your pediatrician regarding the use of this medicine in children. This medicine is not approved for use in children. ?Overdosage: If you think you have taken too much of this medicine contact a poison control center or emergency room at once. ?NOTE: This medicine is only for you. Do not share this medicine with others. ?What if I miss a dose? ?It is important not to miss a dose. Call your doctor or health care professional if you are unable to keep an appointment. ?What may interact with this medication? ?This medicine may interact with the following medications: ?certain types of chemotherapy, such as daunorubicin, doxorubicin, epirubicin, and idarubicin ?This list may not describe all possible interactions. Give your health care provider a list of all the medicines, herbs, non-prescription drugs, or dietary supplements you use. Also tell them if you smoke, drink alcohol, or use illegal drugs. Some items may interact with your medicine. ?What should I watch for while using this  medication? ?Visit your doctor for checks on your progress. Report any side effects. Continue your course of treatment even though you feel ill unless your doctor tells you to stop. ?Call your doctor or health care professional for advice if you get a fever, chills or sore throat, or other symptoms of a cold or flu. Do not treat yourself. Try to avoid being around people who are sick. ?You may experience fever, chills and shaking during your first infusion. These effects are usually mild and can be treated with other medicines. Report any side effects during the infusion to your health care professional. Fever and chills usually do not happen with later infusions. ?Do not become pregnant while taking this medicine or for 7 months after stopping it. Women should inform their doctor if they wish to become pregnant or think they might be pregnant. Women of child-bearing potential will need to have a negative pregnancy test before starting this medicine. There is a potential for serious side effects to an unborn child. Talk to your health care professional or pharmacist for more information. Do not breast-feed an infant while taking this medicine or for 7 months after stopping it. ?What side effects may I notice from receiving this medication? ?Side effects that you should report to your doctor or health care professional as soon as possible: ?allergic reactions like skin rash, itching or hives, swelling of the face, lips, or tongue ?breathing problems ?chest pain or palpitations ?cough ?fever ?general ill feeling or flu-like symptoms ?signs of worsening heart failure like breathing problems; swelling in your legs and feet ?Side effects that usually do not require medical attention (report these to your doctor   or health care professional if they continue or are bothersome): ?bone pain ?changes in taste ?diarrhea ?joint pain ?nausea/vomiting ?unusually weak or tired ?weight loss ?This list may not describe all possible  side effects. Call your doctor for medical advice about side effects. You may report side effects to FDA at 1-800-FDA-1088. ?Where should I keep my medication? ?This drug is given in a hospital or clinic and will not be stored at home. ?NOTE: This sheet is a summary. It may not cover all possible information. If you have questions about this medicine, talk to your doctor, pharmacist, or health care provider. ?? 2022 Elsevier/Gold Standard (2017-11-29 00:00:00) ? ?

## 2021-09-26 ENCOUNTER — Ambulatory Visit: Payer: Federal, State, Local not specified - PPO | Attending: General Surgery

## 2021-09-26 DIAGNOSIS — C50412 Malignant neoplasm of upper-outer quadrant of left female breast: Secondary | ICD-10-CM | POA: Insufficient documentation

## 2021-09-26 DIAGNOSIS — R293 Abnormal posture: Secondary | ICD-10-CM | POA: Insufficient documentation

## 2021-09-26 DIAGNOSIS — Z170 Estrogen receptor positive status [ER+]: Secondary | ICD-10-CM | POA: Insufficient documentation

## 2021-09-26 DIAGNOSIS — Z483 Aftercare following surgery for neoplasm: Secondary | ICD-10-CM | POA: Insufficient documentation

## 2021-09-26 NOTE — Patient Instructions (Signed)
Pt educated in self MLD to left breast and given illustrated and written instructions ?

## 2021-09-26 NOTE — Therapy (Addendum)
[place] ?[facility] ?[place], [place], [address] ?Phone: [phone]   Fax:  [phone] ? ?Physical Therapy Treatment ? ?Patient Details  ?Name: Joanna Osborn ?MRN: [id number] ?Date of Birth: 10-22-1970 ?Referring Provider (PT): Dr. Rolm Bookbinder ? ? ?Encounter Date: 09/26/2021 ? ? PT End of Session - 09/26/21 0802   ? ? Visit Number 4   ? Number of Visits 10   ? Date for PT Re-Evaluation 10/13/21   ? PT Start Time [id number]   ? PT Stop Time [phone]   ? PT Time Calculation (min) 49 min   ? Activity Tolerance Patient tolerated treatment well   ? Behavior During Therapy [facility] for tasks assessed/performed   ? ?  ?  ? ?  ? ? ?Past Medical History:  ?Diagnosis Date  ? Anxiety   ? Breast cancer ([place])   ? Depression   ? Family history of colon cancer 03/19/2021  ? History of kidney stones   ? Hypertension   ? ? ?Past Surgical History:  ?Procedure Laterality Date  ? BREAST LUMPECTOMY WITH RADIOACTIVE SEED AND SENTINEL LYMPH NODE BIOPSY Left 08/25/2021  ? Procedure: LEFT BREAST LUMPECTOMY WITH RADIOACTIVE SEED AND AXILLARY SENTINEL LYMPH NODE BIOPSY;  Surgeon: Rolm Bookbinder, MD;  Location: [place];  Service: General;  Laterality: Left;  ? CHOLECYSTECTOMY  2002  ? INSERTION OF MESH N/A 10/28/2016  ? Procedure: INSERTION OF MESH;  Surgeon: Clovis [name], MD;  Location: [place];  Service: General;  Laterality: N/A;  ? PORTACATH PLACEMENT Right 04/02/2021  ? Procedure: INSERTION PORT-A-CATH;  Surgeon: Rolm Bookbinder, MD;  Location: [place];  Service: General;  Laterality: Right;  ? [place]  ? VENTRAL HERNIA REPAIR N/A 10/28/2016  ? Procedure: LAPAROSCOPIC VENTRAL HERNIA REPAIR;  Surgeon: Clovis [name], MD;  Location: [place];  Service: General;  Laterality: N/A;  ? WRIST SURGERY    ? ? ?There were no vitals filed for this visit. ? ? Subjective Assessment - 09/26/21 0802   ? ? Subjective I was able to get the compression bra.  It seems like it is helping. Firm areas are still present. breast is sore but no real pain   ? Pertinent History Patient was diagnosed on 03/05/2021 with left grade III invasive ductal carcinoma breast cancer. She underwent neoadjuvant chemotherapy from 04/03/2021-07/24/2021. She had a left lumpectomy and sentinel node biopsy (2 negative nodes) on 08/25/2021. It is triple positive with a Ki67 of 40%.   ? Currently in Pain? No/denies   ? Pain Score 0-No pain   ? ?  ?  ? ?  ? ? ? ? ? ? ? ? ? ? ? ? ? ? ? ? ? ? ? ? [place] Adult PT Treatment/Exercise - 09/26/21 0001   ? ?  ? Manual Therapy  ? Manual therapy comments chip packs made for areas of fibrosis in left breast ; pt also instructed in soft tissue mobilization to fibrosis prior to MLD  ? Manual Lymphatic Drainage (MLD) In supine with head of table elevated: short neck, 5 diaphragmatic breaths, right axillary nodes and establishment of interaxillary pathway, left inguinal nodes and establishment of axillo inguinal pathway, L breast and axilla moving fluid towards pathways  and ending with LN's while instructing pt proper technique and having pt practice. Pt given written instructions   ? ?  ?  ? ?  ? ? ? ? ? ? ? ? ? ?  PT Education - 09/26/21 0850   ? ? Education Details Self left breast MLD   ? Person(s) Educated Patient   ? Methods Explanation;Demonstration;Handout   ? Comprehension Returned demonstration;Need further instruction   ? ?  ?  ? ?  ? ? ? ? ? ? PT Long Term Goals - 09/23/21 0807   ? ?  ? PT LONG TERM GOAL #1  ? Title Patient will demonstrate she has regained full shoulder ROM and function post operatively compared to baselines.   ? Time 4   ? Period Weeks   ? Status Achieved   ?  ? PT LONG TERM GOAL #2  ? Title Patient will increase left shoulder active flexion to >/= 150 degrees for increased ease reaching overhead.   ? Baseline 134 post op; 151 pre-op; 09/23/21- 170   ? Time 4   ? Period Weeks   ? Status Achieved   ?  ? PT LONG TERM GOAL #3  ? Title Patient  wil increase left shoulder active abduction to >/= 160 degrees to easily obtain radiation positioning.   ? Baseline 138 post op and 161 pre-op; 09/23/21- 176   ? Time 4   ? Period Weeks   ? Status Achieved   ?  ? PT LONG TERM GOAL #4  ? Title Patient will improve her DASH score to be o (zero) for overall improved UE function.   ? Baseline 4.55 post op; 0 pre-op   ? Time 4   ? Period Weeks   ? Status On-going   ?  ? PT LONG TERM GOAL #5  ? Title Patient will report >/= 50% less pain and swelling in her left breast and axilla to tolerate wearing a bra.   ? Time 4   ? Period Weeks   ? Status On-going   ? ?  ?  ? ?  ? ? ? ? ? ? ? ? Plan - 09/26/21 0853   ? ? Clinical Impression Statement Chip packs were made for left breast area of fibrosis proximal to nipple and at axillary area. Pt was instructed in self MLD to the left breast after demonstration by therapist.  She did exceptionally well for the first visit and used good stretch and pressure. She was given an illustrated handout. she was also instructed in soft tissue mobilization to areas of firmness to do before MLD.  The left breast was much softer after treatment today.   ? Stability/Clinical Decision Making Stable/Uncomplicated   ? PT Frequency 2x / week   ? PT Duration 4 weeks   ? PT Treatment/Interventions ADLs/Self Care Home Management;Therapeutic exercise;Manual lymph drainage;Manual techniques;Patient/family education;Scar mobilization;Passive range of motion   ? PT Next Visit Plan MLD and scar massage to L breast and axilla reduce swelling and improve scar tissue mobility, create chip pack for compression bra, instruct in self MLD   ? PT Home Exercise Plan Post op HEP   ? Consulted and Agree with Plan of Care Patient   ? ?  ?  ? ?  ? ? ?Patient will benefit from skilled therapeutic intervention in order to improve the following deficits and impairments:  Postural dysfunction, Decreased range of motion, Decreased knowledge of precautions, Increased edema,  Increased fascial restricitons, Impaired UE functional use, Pain ? ?Visit Diagnosis: ?Aftercare following surgery for neoplasm ? ?Abnormal posture ? ?Malignant neoplasm of upper-outer quadrant of left breast in female, estrogen receptor positive ([place]) ? ? ? ? ?Problem List ?Patient Active Problem  List  ? Diagnosis Date Noted  ? Left breast abscess 09/12/2021  ? Essential hypertension 09/12/2021  ? Lesion of pancreas 09/12/2021  ? Hypokalemia 09/12/2021  ? Genetic testing 04/08/2021  ? Port-A-Cath in place 04/03/2021  ? Family history of colon cancer 03/19/2021  ? Malignant neoplasm of upper-outer quadrant of left breast in female, estrogen receptor positive ([place]) 03/13/2021  ? Suicide ideation 07/07/2013  ? Major depressive disorder, recurrent severe without psychotic features ([place]) 07/06/2013  ? ? ?Claris Pong, PT ?09/26/2021, 8:56 AM ? ?[place] ?[facility] [place] ?[place], [place], [address] ?Phone: [phone]   Fax:  [phone] ? ?Name: Joanna Osborn ?MRN: [id number] ?Date of Birth: 1971-07-12 ? ? ? ?

## 2021-10-01 ENCOUNTER — Encounter: Payer: Self-pay | Admitting: *Deleted

## 2021-10-01 ENCOUNTER — Other Ambulatory Visit: Payer: Self-pay

## 2021-10-01 ENCOUNTER — Ambulatory Visit: Payer: Federal, State, Local not specified - PPO

## 2021-10-01 DIAGNOSIS — R293 Abnormal posture: Secondary | ICD-10-CM | POA: Diagnosis not present

## 2021-10-01 DIAGNOSIS — C50412 Malignant neoplasm of upper-outer quadrant of left female breast: Secondary | ICD-10-CM

## 2021-10-01 DIAGNOSIS — Z5112 Encounter for antineoplastic immunotherapy: Secondary | ICD-10-CM | POA: Diagnosis not present

## 2021-10-01 DIAGNOSIS — Z483 Aftercare following surgery for neoplasm: Secondary | ICD-10-CM | POA: Diagnosis not present

## 2021-10-01 DIAGNOSIS — Z510 Encounter for antineoplastic radiation therapy: Secondary | ICD-10-CM | POA: Diagnosis not present

## 2021-10-01 DIAGNOSIS — Z170 Estrogen receptor positive status [ER+]: Secondary | ICD-10-CM | POA: Diagnosis not present

## 2021-10-01 NOTE — Therapy (Addendum)
[place] ?[facility] ?[place], [place], [address] ?Phone: [phone]   Fax:  [phone] ? ?Physical Therapy Treatment ? ?Patient Details  ?Name: Joanna Osborn ?MRN: [id number] ?Date of Birth: [birth year] ?Referring Provider (PT): Dr. Rolm Bookbinder ? ? ?Encounter Date: 10/01/2021 ? ? PT End of Session - 10/01/21 0758   ? ? Visit Number 5   ? Number of Visits 10   ? Date for PT Re-Evaluation 10/13/21   ? PT Start Time [phone]   ? PT Stop Time [phone]   ? PT Time Calculation (min) 53 min   ? Activity Tolerance Patient tolerated treatment well   ? Behavior During Therapy [facility] for tasks assessed/performed   ? ?  ?  ? ?  ? ? ?Past Medical History:  ?Diagnosis Date  ? Anxiety   ? Breast cancer ([place])   ? Depression   ? Family history of colon cancer 03/19/2021  ? History of kidney stones   ? Hypertension   ? ? ?Past Surgical History:  ?Procedure Laterality Date  ? BREAST LUMPECTOMY WITH RADIOACTIVE SEED AND SENTINEL LYMPH NODE BIOPSY Left 08/25/2021  ? Procedure: LEFT BREAST LUMPECTOMY WITH RADIOACTIVE SEED AND AXILLARY SENTINEL LYMPH NODE BIOPSY;  Surgeon: Rolm Bookbinder, MD;  Location: [place];  Service: General;  Laterality: Left;  ? CHOLECYSTECTOMY  2002  ? INSERTION OF MESH N/A 10/28/2016  ? Procedure: INSERTION OF MESH;  Surgeon: Clovis [name], MD;  Location: [place];  Service: General;  Laterality: N/A;  ? PORTACATH PLACEMENT Right 04/02/2021  ? Procedure: INSERTION PORT-A-CATH;  Surgeon: Rolm Bookbinder, MD;  Location: [place];  Service: General;  Laterality: Right;  ? [place]  ? VENTRAL HERNIA REPAIR N/A 10/28/2016  ? Procedure: LAPAROSCOPIC VENTRAL HERNIA REPAIR;  Surgeon: Clovis [name], MD;  Location: [place];  Service: General;  Laterality: N/A;  ? WRIST SURGERY    ? ? ?There were no vitals filed for this visit. ? ? Subjective Assessment - 10/01/21 0757   ? ? Subjective Pt reports wearing the chip packs and  she does feel that in one area on the breast it is getting smaller. The outside of the breast has been more resistant and when I raise my arm I feel it. Intermittent sharp pains in left breast   ? Pertinent History Patient was diagnosed on 03/05/2021 with left grade III invasive ductal carcinoma breast cancer. She underwent neoadjuvant chemotherapy from 04/03/2021-07/24/2021. She had a left lumpectomy and sentinel node biopsy (2 negative nodes) on 08/25/2021. It is triple positive with a Ki67 of 40%.   ? Patient Stated Goals See how my arm is doing   ? Currently in Pain? No/denies   ? Pain Score 0-No pain   ? ?  ?  ? ?  ? ? ? ? ? ? ? ? ? ? ? ? ? ? ? ? ? ? ? ? [place] Adult PT Treatment/Exercise - 10/01/21 0001   ? ?  ? Shoulder Exercises: Supine  ? Horizontal ABduction Strengthening;Both;5 reps   ? Theraband Level (Shoulder Horizontal ABduction) Level 1 (Yellow)   ? External Rotation Strengthening;Both;5 reps   ? Theraband Level (Shoulder External Rotation) Level 1 (Yellow)   ? Flexion Strengthening;Both;5 reps   ? Theraband Level (Shoulder Flexion) Level 1 (Yellow)   ? Diagonals Strengthening;Right;Left;5 reps   ? Theraband Level (Shoulder Diagonals) Level 1 (Yellow)   ?  ? Manual Therapy  ?  Manual therapy comments less firmness noted after using chip packs   ? Manual Lymphatic Drainage (MLD) In supine with head of table elevated: short neck, 5 diaphragmatic breaths, right axillary nodes and establishment of interaxillary pathway, left inguinal nodes and establishment of axillo inguinal pathway, L breast and axilla moving fluid towards pathways  and ending with LN's while instructing pt proper technique. PT perfomed first and then Pt performed all steps with occasional VC's  ? ?  ?  ? ?  ? ? ? ? ? ? ? ? ? ? PT Education - 10/01/21 0847   ? ? Education Details supie scapular series   ? Person(s) Educated Patient   ? Methods Demonstration;Handout   ? Comprehension Returned demonstration   ? ?  ?  ? ?  ? ? ? ? ? ? PT Long  Term Goals - 09/23/21 0807   ? ?  ? PT LONG TERM GOAL #1  ? Title Patient will demonstrate she has regained full shoulder ROM and function post operatively compared to baselines.   ? Time 4   ? Period Weeks   ? Status Achieved   ?  ? PT LONG TERM GOAL #2  ? Title Patient will increase left shoulder active flexion to >/= 150 degrees for increased ease reaching overhead.   ? Baseline 134 post op; 151 pre-op; 09/23/21- 170   ? Time 4   ? Period Weeks   ? Status Achieved   ?  ? PT LONG TERM GOAL #3  ? Title Patient wil increase left shoulder active abduction to >/= 160 degrees to easily obtain radiation positioning.   ? Baseline 138 post op and 161 pre-op; 09/23/21- 176   ? Time 4   ? Period Weeks   ? Status Achieved   ?  ? PT LONG TERM GOAL #4  ? Title Patient will improve her DASH score to be o (zero) for overall improved UE function.   ? Baseline 4.55 post op; 0 pre-op   ? Time 4   ? Period Weeks   ? Status On-going   ?  ? PT LONG TERM GOAL #5  ? Title Patient will report >/= 50% less pain and swelling in her left breast and axilla to tolerate wearing a bra.   ? Time 4   ? Period Weeks   ? Status On-going   ? ?  ?  ? ?  ? ? ? ? ? ? ? ? Plan - 10/01/21 0758   ? ? Clinical Impression Statement Pt is getting good benefit from chip packs with decreased fibrosis noted in left breast.  therapist performed MLD and then had pt perform MLD to the left breast.  SHe did exceptionally well overall with only occasional VC's.  Pt was educated in supine scapular series and was able to return demonstrate all with good form and no pain.   ? Stability/Clinical Decision Making Stable/Uncomplicated   ? PT Frequency 2x / week   ? PT Duration 4 weeks   ? PT Treatment/Interventions ADLs/Self Care Home Management;Therapeutic exercise;Manual lymph drainage;Manual techniques;Patient/family education;Scar mobilization;Passive range of motion   ? PT Next Visit Plan MLD and scar massage to L breast and axilla reduce swelling and improve scar  tissue mobility, create chip pack for compression bra, instruct in self MLD   ? PT Home Exercise Plan Post op HEP, supine scap series, self left breast MLD   ? Recommended Other Services flexitouch?   ? Consulted and Agree with  Plan of Care Patient   ? ?  ?  ? ?  ? ? ?Patient will benefit from skilled therapeutic intervention in order to improve the following deficits and impairments:  Postural dysfunction, Decreased range of motion, Decreased knowledge of precautions, Increased edema, Increased fascial restricitons, Impaired UE functional use, Pain ? ?Visit Diagnosis: ?Aftercare following surgery for neoplasm ? ?Abnormal posture ? ?Malignant neoplasm of upper-outer quadrant of left breast in female, estrogen receptor positive ([place]) ? ? ? ? ?Problem List ?Patient Active Problem List  ? Diagnosis Date Noted  ? Left breast abscess 09/12/2021  ? Essential hypertension 09/12/2021  ? Lesion of pancreas 09/12/2021  ? Hypokalemia 09/12/2021  ? Genetic testing 04/08/2021  ? Port-A-Cath in place 04/03/2021  ? Family history of colon cancer 03/19/2021  ? Malignant neoplasm of upper-outer quadrant of left breast in female, estrogen receptor positive ([place]) 03/13/2021  ? Suicide ideation 07/07/2013  ? Major depressive disorder, recurrent severe without psychotic features ([place]) 07/06/2013  ? ? ?Joanna Osborn, PT ?10/01/2021, 8:58 AM ? ?[place] ?[place] [facility] [place] ?[place], [place], [address] ?Phone: [phone]   Fax:  [phone] ? ?Name: Joanna Osborn ?MRN: [id number] ?Date of Birth: 13-Jul-1971 ? ? ? ?

## 2021-10-01 NOTE — Progress Notes (Signed)
Combined Insurance form completed for patient and sent to Dr. Burr Medico for signature. ?

## 2021-10-01 NOTE — Patient Instructions (Signed)

## 2021-10-02 ENCOUNTER — Telehealth: Payer: Self-pay

## 2021-10-02 ENCOUNTER — Encounter (HOSPITAL_COMMUNITY): Payer: Self-pay

## 2021-10-02 ENCOUNTER — Ambulatory Visit
Admission: RE | Admit: 2021-10-02 | Discharge: 2021-10-02 | Disposition: A | Discharge status: Home / Self Care | Payer: Federal, State, Local not specified - PPO | Source: Ambulatory Visit | Attending: Radiation Oncology | Admitting: Radiation Oncology

## 2021-10-02 ENCOUNTER — Encounter: Payer: Self-pay | Admitting: *Deleted

## 2021-10-02 DIAGNOSIS — Z510 Encounter for antineoplastic radiation therapy: Secondary | ICD-10-CM | POA: Diagnosis not present

## 2021-10-02 DIAGNOSIS — Z170 Estrogen receptor positive status [ER+]: Secondary | ICD-10-CM | POA: Diagnosis not present

## 2021-10-02 DIAGNOSIS — C50412 Malignant neoplasm of upper-outer quadrant of left female breast: Secondary | ICD-10-CM | POA: Diagnosis not present

## 2021-10-02 DIAGNOSIS — Z5112 Encounter for antineoplastic immunotherapy: Secondary | ICD-10-CM | POA: Diagnosis not present

## 2021-10-02 NOTE — Telephone Encounter (Signed)
Notified Patient of completion of Disability Forms. Fax transmission confirmation received. Copy mailed to Patient. ?

## 2021-10-03 ENCOUNTER — Other Ambulatory Visit: Payer: Self-pay

## 2021-10-03 ENCOUNTER — Ambulatory Visit
Admission: RE | Admit: 2021-10-03 | Discharge: 2021-10-03 | Disposition: A | Discharge status: Home / Self Care | Payer: Federal, State, Local not specified - PPO | Source: Ambulatory Visit | Attending: Radiation Oncology | Admitting: Radiation Oncology

## 2021-10-03 ENCOUNTER — Ambulatory Visit: Payer: Federal, State, Local not specified - PPO

## 2021-10-03 DIAGNOSIS — Z5112 Encounter for antineoplastic immunotherapy: Secondary | ICD-10-CM | POA: Diagnosis not present

## 2021-10-03 DIAGNOSIS — R293 Abnormal posture: Secondary | ICD-10-CM

## 2021-10-03 DIAGNOSIS — C50412 Malignant neoplasm of upper-outer quadrant of left female breast: Secondary | ICD-10-CM

## 2021-10-03 DIAGNOSIS — Z170 Estrogen receptor positive status [ER+]: Secondary | ICD-10-CM

## 2021-10-03 DIAGNOSIS — Z483 Aftercare following surgery for neoplasm: Secondary | ICD-10-CM

## 2021-10-03 DIAGNOSIS — Z510 Encounter for antineoplastic radiation therapy: Secondary | ICD-10-CM | POA: Diagnosis not present

## 2021-10-03 MED ORDER — RADIAPLEXRX EX GEL: Freq: Once | CUTANEOUS | Status: AC

## 2021-10-03 NOTE — Therapy (Signed)
[place] ?[facility] [place] ?[place], [place], [address] ?Phone: [phone]   Fax:  [phone] ? ?Physical Therapy Treatment ? ?Patient Details  ?Name: Joanna Osborn ?MRN: [id number] ?Date of Birth: 1970-10-14 ?Referring Provider (PT): Dr. Rolm Bookbinder ? ? ?Encounter Date: 10/03/2021 ? ? PT End of Session - 10/03/21 0805   ? ? Visit Number 6   ? Number of Visits 10   ? Date for PT Re-Evaluation 10/13/21   ? PT Start Time 0801   ? PT Stop Time 0850   ? PT Time Calculation (min) 49 min   ? Activity Tolerance Patient tolerated treatment well   ? Behavior During Therapy [facility] for tasks assessed/performed   ? ?  ?  ? ?  ? ? ?Past Medical History:  ?Diagnosis Date  ? Anxiety   ? Breast cancer ([place])   ? Depression   ? Family history of colon cancer 03/19/2021  ? History of kidney stones   ? Hypertension   ? ? ?Past Surgical History:  ?Procedure Laterality Date  ? BREAST LUMPECTOMY WITH RADIOACTIVE SEED AND SENTINEL LYMPH NODE BIOPSY Left 08/25/2021  ? Procedure: LEFT BREAST LUMPECTOMY WITH RADIOACTIVE SEED AND AXILLARY SENTINEL LYMPH NODE BIOPSY;  Surgeon: Rolm Bookbinder, MD;  Location: [facility];  Service: General;  Laterality: Left;  ? CHOLECYSTECTOMY  2002  ? INSERTION OF MESH N/A 10/28/2016  ? Procedure: INSERTION OF MESH;  Surgeon: Clovis [name], MD;  Location: [facility];  Service: General;  Laterality: N/A;  ? PORTACATH PLACEMENT Right 04/02/2021  ? Procedure: INSERTION PORT-A-CATH;  Surgeon: Rolm Bookbinder, MD;  Location: [facility];  Service: General;  Laterality: Right;  ? [place]  ? VENTRAL HERNIA REPAIR N/A 10/28/2016  ? Procedure: LAPAROSCOPIC VENTRAL HERNIA REPAIR;  Surgeon: Clovis [name], MD;  Location: [facility];  Service: General;  Laterality: N/A;  ? WRIST SURGERY    ? ? ?There were no vitals filed for this visit. ? ? Subjective Assessment - 10/03/21 0802   ? ? Subjective The breast is still doing better and  is softer. I think the hard area is getting smaller   ? Pertinent History Patient was diagnosed on 03/05/2021 with left grade III invasive ductal carcinoma breast cancer. She underwent neoadjuvant chemotherapy from 04/03/2021-07/24/2021. She had a left lumpectomy and sentinel node biopsy (2 negative nodes) on 08/25/2021. It is triple positive with a Ki67 of 40%.   ? Patient Stated Goals See how my arm is doing   ? Currently in Pain? No/denies   ? Pain Score 0-No pain   ? ?  ?  ? ?  ? ? ? ? ? ? ? ? ? ? ? ? ? ? ? ? ? ? ? ? [place] Adult PT Treatment/Exercise - 10/03/21 0001   ? ?  ? Shoulder Exercises: Supine  ? Horizontal ABduction Strengthening;Both;10 reps   ? Theraband Level (Shoulder Horizontal ABduction) Level 1 (Yellow)   ? External Rotation Strengthening;Both;10 reps   ? Theraband Level (Shoulder External Rotation) Level 1 (Yellow)   ? Flexion Strengthening;Both;10 reps   ? Theraband Level (Shoulder Flexion) Level 1 (Yellow)   ? Diagonals Strengthening;Right;Left;5 reps   ? Theraband Level (Shoulder Diagonals) Level 1 (Yellow)   ?  ? Manual Therapy  ? Manual therapy comments less firmness noted after using chip packs   peach foam added to inferior breast  ? Manual Lymphatic Drainage (MLD) In supine with  head of table elevated: short neck, 5 diaphragmatic breaths, right axillary nodes and establishment of interaxillary pathway, left inguinal nodes and establishment of axillo inguinal pathway, L breast and axilla moving fluid towards pathways  and ending with LN's while instructing pt proper techbique. PT perfomed first and then Pt performed all steps   ? ?  ?  ? ?  ? ? ? ? ? ? ? ? ? ? ? ? ? ? ? PT Long Term Goals - 09/23/21 0807   ? ?  ? PT LONG TERM GOAL #1  ? Title Patient will demonstrate she has regained full shoulder ROM and function post operatively compared to baselines.   ? Time 4   ? Period Weeks   ? Status Achieved   ?  ? PT LONG TERM GOAL #2  ? Title Patient will increase left shoulder active flexion to >/=  150 degrees for increased ease reaching overhead.   ? Baseline 134 post op; 151 pre-op; 09/23/21- 170   ? Time 4   ? Period Weeks   ? Status Achieved   ?  ? PT LONG TERM GOAL #3  ? Title Patient wil increase left shoulder active abduction to >/= 160 degrees to easily obtain radiation positioning.   ? Baseline 138 post op and 161 pre-op; 09/23/21- 176   ? Time 4   ? Period Weeks   ? Status Achieved   ?  ? PT LONG TERM GOAL #4  ? Title Patient will improve her DASH score to be o (zero) for overall improved UE function.   ? Baseline 4.55 post op; 0 pre-op   ? Time 4   ? Period Weeks   ? Status On-going   ?  ? PT LONG TERM GOAL #5  ? Title Patient will report >/= 50% less pain and swelling in her left breast and axilla to tolerate wearing a bra.   ? Time 4   ? Period Weeks   ? Status On-going   ? ?  ?  ? ?  ? ? ? ? ? ? ? ? Plan - 10/03/21 0805   ? ? Clinical Impression Statement Peach foam made for inferior breast and pt is still using chip packs and getting good results.  Reviewed self breast manual lymphatic drainage and pt did very well requiring very few verbal and tactile cues.  She also used excellent form with supine scapular series. She will benefit from another session of review and should be ready for discharge.   ? Stability/Clinical Decision Making Stable/Uncomplicated   ? PT Frequency 2x / week   ? PT Duration 4 weeks   ? PT Treatment/Interventions ADLs/Self Care Home Management;Therapeutic exercise;Manual lymph drainage;Manual techniques;Patient/family education;Scar mobilization;Passive range of motion   ? PT Next Visit Plan ? DC,MLD and scar massage to L breast and axilla reduce swelling and improve scar tissue mobility, check chip pack/peach foam benefit, instruct in self MLD   ? PT Home Exercise Plan Post op HEP, supine scap series, self left breast MLD   ? Recommended Other Services demo sent for flexitouch   ? Consulted and Agree with Plan of Care Patient   ? ?  ?  ? ?  ? ? ?Patient will benefit from  skilled therapeutic intervention in order to improve the following deficits and impairments:  Postural dysfunction, Decreased range of motion, Decreased knowledge of precautions, Increased edema, Increased fascial restricitons, Impaired UE functional use, Pain ? ?Visit Diagnosis: ?Aftercare following surgery for neoplasm ? ?  Abnormal posture ? ?Malignant neoplasm of upper-outer quadrant of left breast in female, estrogen receptor positive ([place]) ? ? ? ? ?Problem List ?Patient Active Problem List  ? Diagnosis Date Noted  ? Left breast abscess 09/12/2021  ? Essential hypertension 09/12/2021  ? Lesion of pancreas 09/12/2021  ? Hypokalemia 09/12/2021  ? Genetic testing 04/08/2021  ? Port-A-Cath in place 04/03/2021  ? Family history of colon cancer 03/19/2021  ? Malignant neoplasm of upper-outer quadrant of left breast in female, estrogen receptor positive ([place]) 03/13/2021  ? Suicide ideation 07/07/2013  ? Major depressive disorder, recurrent severe without psychotic features ([place]) 07/06/2013  ? ? ?Claris Pong, PT ?10/03/2021, 8:59 AM ? ? ?[facility] [place] ?[place], [place], [address] ?Phone: [phone]   Fax:  [phone] ? ?Name: Joanna Osborn ?MRN: [id number] ?Date of Birth: Jun 04, 1971 ? ? ? ?

## 2021-10-03 NOTE — Progress Notes (Signed)
Pt here for patient teaching.  Pt given Radiation and You booklet, skin care instructions, and Radiaplex.  Patient was advised to obtain over the counter aluminum free deodorant.  Reviewed areas of pertinence such as fatigue, hair loss, skin changes, breast tenderness, and breast swelling. Pt able to give teach back of to pat skin and use unscented/gentle soap,apply Radiaplex bid, avoid applying anything to skin within 4 hours of treatment, avoid wearing an under wire bra, and to use an electric razor if they must shave. Pt verbalizes understanding of information given and will contact nursing with any questions or concerns.   ? ?Gloriajean Dell. Leonie Green, BSN  ?

## 2021-10-06 ENCOUNTER — Ambulatory Visit
Admission: RE | Admit: 2021-10-06 | Discharge: 2021-10-06 | Disposition: A | Discharge status: Home / Self Care | Payer: Federal, State, Local not specified - PPO | Source: Ambulatory Visit | Attending: Radiation Oncology | Admitting: Radiation Oncology

## 2021-10-06 ENCOUNTER — Other Ambulatory Visit: Payer: Self-pay

## 2021-10-06 DIAGNOSIS — Z510 Encounter for antineoplastic radiation therapy: Secondary | ICD-10-CM | POA: Diagnosis not present

## 2021-10-06 DIAGNOSIS — Z170 Estrogen receptor positive status [ER+]: Secondary | ICD-10-CM | POA: Diagnosis not present

## 2021-10-06 DIAGNOSIS — Z5112 Encounter for antineoplastic immunotherapy: Secondary | ICD-10-CM | POA: Diagnosis not present

## 2021-10-06 DIAGNOSIS — C50412 Malignant neoplasm of upper-outer quadrant of left female breast: Secondary | ICD-10-CM | POA: Diagnosis not present

## 2021-10-07 ENCOUNTER — Ambulatory Visit
Admission: RE | Admit: 2021-10-07 | Discharge: 2021-10-07 | Disposition: A | Discharge status: Home / Self Care | Payer: Federal, State, Local not specified - PPO | Source: Ambulatory Visit | Attending: Radiation Oncology | Admitting: Radiation Oncology

## 2021-10-07 DIAGNOSIS — Z170 Estrogen receptor positive status [ER+]: Secondary | ICD-10-CM | POA: Diagnosis not present

## 2021-10-07 DIAGNOSIS — Z5112 Encounter for antineoplastic immunotherapy: Secondary | ICD-10-CM | POA: Diagnosis not present

## 2021-10-07 DIAGNOSIS — Z510 Encounter for antineoplastic radiation therapy: Secondary | ICD-10-CM | POA: Diagnosis not present

## 2021-10-07 DIAGNOSIS — C50412 Malignant neoplasm of upper-outer quadrant of left female breast: Secondary | ICD-10-CM | POA: Diagnosis not present

## 2021-10-08 ENCOUNTER — Other Ambulatory Visit: Payer: Self-pay

## 2021-10-08 ENCOUNTER — Ambulatory Visit
Admission: RE | Admit: 2021-10-08 | Discharge: 2021-10-08 | Disposition: A | Discharge status: Home / Self Care | Payer: Federal, State, Local not specified - PPO | Source: Ambulatory Visit | Attending: Radiation Oncology | Admitting: Radiation Oncology

## 2021-10-08 DIAGNOSIS — C50412 Malignant neoplasm of upper-outer quadrant of left female breast: Secondary | ICD-10-CM | POA: Diagnosis not present

## 2021-10-08 DIAGNOSIS — Z5112 Encounter for antineoplastic immunotherapy: Secondary | ICD-10-CM | POA: Diagnosis not present

## 2021-10-08 DIAGNOSIS — Z510 Encounter for antineoplastic radiation therapy: Secondary | ICD-10-CM | POA: Diagnosis not present

## 2021-10-08 DIAGNOSIS — Z170 Estrogen receptor positive status [ER+]: Secondary | ICD-10-CM | POA: Diagnosis not present

## 2021-10-09 ENCOUNTER — Ambulatory Visit
Admission: RE | Admit: 2021-10-09 | Discharge: 2021-10-09 | Disposition: A | Discharge status: Home / Self Care | Payer: Federal, State, Local not specified - PPO | Source: Ambulatory Visit | Attending: Radiation Oncology | Admitting: Radiation Oncology

## 2021-10-09 DIAGNOSIS — Z170 Estrogen receptor positive status [ER+]: Secondary | ICD-10-CM | POA: Diagnosis not present

## 2021-10-09 DIAGNOSIS — C50412 Malignant neoplasm of upper-outer quadrant of left female breast: Secondary | ICD-10-CM | POA: Diagnosis not present

## 2021-10-09 DIAGNOSIS — Z5112 Encounter for antineoplastic immunotherapy: Secondary | ICD-10-CM | POA: Diagnosis not present

## 2021-10-09 DIAGNOSIS — Z510 Encounter for antineoplastic radiation therapy: Secondary | ICD-10-CM | POA: Diagnosis not present

## 2021-10-10 ENCOUNTER — Ambulatory Visit
Admission: RE | Admit: 2021-10-10 | Discharge: 2021-10-10 | Disposition: A | Discharge status: Home / Self Care | Payer: Federal, State, Local not specified - PPO | Source: Ambulatory Visit | Attending: Radiation Oncology | Admitting: Radiation Oncology

## 2021-10-10 ENCOUNTER — Other Ambulatory Visit: Payer: Self-pay

## 2021-10-10 DIAGNOSIS — C50412 Malignant neoplasm of upper-outer quadrant of left female breast: Secondary | ICD-10-CM | POA: Diagnosis not present

## 2021-10-10 DIAGNOSIS — Z510 Encounter for antineoplastic radiation therapy: Secondary | ICD-10-CM | POA: Diagnosis not present

## 2021-10-10 DIAGNOSIS — Z170 Estrogen receptor positive status [ER+]: Secondary | ICD-10-CM | POA: Diagnosis not present

## 2021-10-10 DIAGNOSIS — Z5112 Encounter for antineoplastic immunotherapy: Secondary | ICD-10-CM | POA: Diagnosis not present

## 2021-10-13 ENCOUNTER — Other Ambulatory Visit: Payer: Self-pay

## 2021-10-13 ENCOUNTER — Ambulatory Visit
Admission: RE | Admit: 2021-10-13 | Discharge: 2021-10-13 | Disposition: A | Discharge status: Home / Self Care | Payer: Federal, State, Local not specified - PPO | Source: Ambulatory Visit | Attending: Radiation Oncology | Admitting: Radiation Oncology

## 2021-10-13 DIAGNOSIS — Z170 Estrogen receptor positive status [ER+]: Secondary | ICD-10-CM | POA: Diagnosis not present

## 2021-10-13 DIAGNOSIS — C50412 Malignant neoplasm of upper-outer quadrant of left female breast: Secondary | ICD-10-CM | POA: Diagnosis not present

## 2021-10-13 DIAGNOSIS — Z5112 Encounter for antineoplastic immunotherapy: Secondary | ICD-10-CM | POA: Diagnosis not present

## 2021-10-13 DIAGNOSIS — Z510 Encounter for antineoplastic radiation therapy: Secondary | ICD-10-CM | POA: Diagnosis not present

## 2021-10-14 ENCOUNTER — Ambulatory Visit
Admission: RE | Admit: 2021-10-14 | Discharge: 2021-10-14 | Disposition: A | Discharge status: Home / Self Care | Payer: Federal, State, Local not specified - PPO | Source: Ambulatory Visit | Attending: Radiation Oncology | Admitting: Radiation Oncology

## 2021-10-14 DIAGNOSIS — C50412 Malignant neoplasm of upper-outer quadrant of left female breast: Secondary | ICD-10-CM | POA: Diagnosis not present

## 2021-10-14 DIAGNOSIS — Z170 Estrogen receptor positive status [ER+]: Secondary | ICD-10-CM | POA: Diagnosis not present

## 2021-10-14 DIAGNOSIS — Z510 Encounter for antineoplastic radiation therapy: Secondary | ICD-10-CM | POA: Diagnosis not present

## 2021-10-14 DIAGNOSIS — Z5112 Encounter for antineoplastic immunotherapy: Secondary | ICD-10-CM | POA: Diagnosis not present

## 2021-10-15 ENCOUNTER — Ambulatory Visit: Payer: Federal, State, Local not specified - PPO

## 2021-10-15 ENCOUNTER — Other Ambulatory Visit: Payer: Self-pay

## 2021-10-15 ENCOUNTER — Ambulatory Visit
Admission: RE | Admit: 2021-10-15 | Discharge: 2021-10-15 | Disposition: A | Discharge status: Home / Self Care | Payer: Federal, State, Local not specified - PPO | Source: Ambulatory Visit | Attending: Radiation Oncology | Admitting: Radiation Oncology

## 2021-10-15 DIAGNOSIS — R293 Abnormal posture: Secondary | ICD-10-CM | POA: Diagnosis not present

## 2021-10-15 DIAGNOSIS — Z170 Estrogen receptor positive status [ER+]: Secondary | ICD-10-CM | POA: Diagnosis not present

## 2021-10-15 DIAGNOSIS — C50412 Malignant neoplasm of upper-outer quadrant of left female breast: Secondary | ICD-10-CM | POA: Diagnosis not present

## 2021-10-15 DIAGNOSIS — Z483 Aftercare following surgery for neoplasm: Secondary | ICD-10-CM

## 2021-10-15 DIAGNOSIS — Z510 Encounter for antineoplastic radiation therapy: Secondary | ICD-10-CM | POA: Diagnosis not present

## 2021-10-15 DIAGNOSIS — Z5112 Encounter for antineoplastic immunotherapy: Secondary | ICD-10-CM | POA: Diagnosis not present

## 2021-10-15 NOTE — Therapy (Signed)
[facility] [address] [place], [place], [address] Phone: [phone]   Fax:  [phone]  Physical Therapy Treatment  Patient Details  Name: Joanna Osborn MRN: [id number] Date of Birth: 13-Sep-1970 Referring Provider (PT): Dr. Emelia Loron   Encounter Date: 10/15/2021   PT End of Session - 10/15/21 1001     Visit Number 7    Number of Visits 10    Date for PT Re-Evaluation 10/15/21    PT Start Time 1002    PT Stop Time 1046    PT Time Calculation (min) 44 min    Activity Tolerance Patient tolerated treatment well    Behavior During Therapy [facility] for tasks assessed/performed             Past Medical History:  Diagnosis Date   Anxiety    Breast cancer ([facility])    Depression    Family history of colon cancer 03/19/2021   History of kidney stones    Hypertension     Past Surgical History:  Procedure Laterality Date   BREAST LUMPECTOMY WITH RADIOACTIVE SEED AND SENTINEL LYMPH NODE BIOPSY Left 08/25/2021   Procedure: LEFT BREAST LUMPECTOMY WITH RADIOACTIVE SEED AND AXILLARY SENTINEL LYMPH NODE BIOPSY;  Surgeon: Emelia Loron, MD;  Location: [facility];  Service: General;  Laterality: Left;   CHOLECYSTECTOMY  2002   INSERTION OF MESH N/A 10/28/2016   Procedure: INSERTION OF MESH;  Surgeon: Berna Bue, MD;  [facility];  Service: General;  Laterality: N/A;   PORTACATH PLACEMENT Right 04/02/2021   Procedure: INSERTION PORT-A-CATH;  Surgeon: Emelia Loron, MD;  Location: [facility];  Service: General;  Laterality: Right;   TUBAL LIGATION  1999   VENTRAL HERNIA REPAIR N/A 10/28/2016   Procedure: LAPAROSCOPIC VENTRAL HERNIA REPAIR;  Surgeon: Berna Bue, MD;  Location: [facility];  Service: General;  Laterality: N/A;   WRIST SURGERY      There were no vitals filed for this visit.   Subjective Assessment - 10/15/21 1000     Subjective I forgot the packs in my bra today  but I have been using them. There is still one place where it is a little firm.  I had my radiation today. On Monday I felt a little tight under my arm. My flexi touch came on Monday and they are coming to the house on the 27th to set it up for me.    Pertinent History Patient was diagnosed on 03/05/2021 with left grade III invasive ductal carcinoma breast cancer. She underwent neoadjuvant chemotherapy from 04/03/2021-07/24/2021. She had a left lumpectomy and sentinel node biopsy (2 negative nodes) on 08/25/2021. It is triple positive with a Ki67 of 40%.    Patient Stated Goals See how my arm is doing    Currently in Pain? No/denies    Pain Score 0-No pain                [facility] PT Assessment - 10/15/21 0001       Assessment   Medical Diagnosis s/p left lumpectomy and SLNB    Referring Provider (PT) Dr. Emelia Loron    Onset Date/Surgical Date 08/25/21    Hand Dominance Right      Prior Function   Level of Independence Independent      Observation/Other Assessments   Observations cording noted left axillary region with mild tenderness. Skin darkening noted at left axillary region from radiation  AROM   Left Shoulder Extension 70 Degrees    Left Shoulder Flexion 170 Degrees    Left Shoulder ABduction 175 Degrees    Left Shoulder External Rotation 104 Degrees               LYMPHEDEMA/ONCOLOGY QUESTIONNAIRE - 10/15/21 0001       Left Upper Extremity Lymphedema   10 cm Proximal to Olecranon Process 35.2 cm    Olecranon Process 26.5 cm    10 cm Proximal to Ulnar Styloid Process 22 cm    Just Proximal to Ulnar Styloid Process 15.5 cm    Across Hand at [facility] 1909 cm    At [place] of 2nd Digit 6 cm                Quick Dash - 10/15/21 0001     Open a tight or new jar No difficulty    Do heavy household chores (wash walls, wash floors) No difficulty    Carry a shopping bag or briefcase No difficulty    Wash your back Mild difficulty    Use a knife to  cut food No difficulty    Recreational activities in which you take some force or impact through your arm, shoulder, or hand (golf, hammering, tennis) No difficulty    During the past week, to what extent has your arm, shoulder or hand problem interfered with your normal social activities with family, friends, neighbors, or groups? Not at all    During the past week, to what extent has your arm, shoulder or hand problem limited your work or other regular daily activities Not at all    Arm, shoulder, or hand pain. Mild    Tingling (pins and needles) in your arm, shoulder, or hand None    Difficulty Sleeping No difficulty    DASH Score 4.55 %                    [facility] Adult PT Treatment/Exercise - 10/15/21 0001       Shoulder Exercises: Supine   Other Supine Exercises Wand exercises flexion, and scaption x 5      Manual Therapy   Manual Lymphatic Drainage (MLD) In supine with head of table elevated. Pt. demonstrated all steps without looking at paper: short neck, 5 diaphragmatic breaths, right axillary nodes and establishment of interaxillary pathway, left inguinal nodes and establishment of axillo inguinal pathway, L breast and axilla moving fluid towards pathways  and ending with LN's                          PT Long Term Goals - 10/15/21 1033       PT LONG TERM GOAL #1   Title Patient will demonstrate she has regained full shoulder ROM and function post operatively compared to baselines.    Time 4    Period Weeks    Status Achieved    Target Date 10/13/21      PT LONG TERM GOAL #2   Title Patient will increase left shoulder active flexion to >/= 150 degrees for increased ease reaching overhead.    Baseline 134 post op; 151 pre-op; 09/23/21- 170    Time 4    Period Weeks    Status Achieved    Target Date 10/13/21      PT LONG TERM GOAL #3   Title Patient wil increase left shoulder active abduction to >/= 160 degrees to easily  obtain radiation  positioning.    Baseline 138 post op and 161 pre-op; 09/23/21- 176    Time 4    Period Weeks    Status Achieved      PT LONG TERM GOAL #4   Title Patient will improve her DASH score to be o (zero) for overall improved UE function.    Baseline 4.55    Time 4    Period Weeks    Status Not Met    Target Date 10/15/21      PT LONG TERM GOAL #5   Title Patient will report >/= 50% less pain and swelling in her left breast and axilla to tolerate wearing a bra.    Baseline 95% better    Time 4    Period Weeks    Status Achieved    Target Date 10/15/21                   Plan - 10/15/21 1025     Clinical Impression Statement Pt remeasured for ROM and circumference of arm and MLD of left breast reviewed with pt. Pt used excellent technique and has a very good understanding of sequence for MLD.  There is still some scar tissue under breast incision and a very small area of fibrosis under the breast. She also has a Flexi tocuh that will be set up on Monday for her. She has achieved all goals established except for Quick dash goal is unchanged. She does have a new cord in the left axilla which is not limiting her motion and she knows how to stretch it. She was advised to contact us right away if she needs to have further PT to get rid of it.  She was given a referral for a prophylactic compression sleeve.  She is discharged from formal PT at this time.    Stability/Clinical Decision Making Stable/Uncomplicated    PT Frequency 2x / week    PT Duration 4 weeks    PT Treatment/Interventions ADLs/Self Care Home Management;Therapeutic exercise;Manual lymph drainage;Manual techniques;Patient/family education;Scar mobilization;Passive range of motion    PT Next Visit Plan Pt discharged to independent self management but advised to contact us with questions or concerns so we can address things quickly prn    PT Home Exercise Plan Post op HEP, supine scap series, self left breast MLD, flexi touch     Consulted and Agree with Plan of Care Patient             Patient will benefit from skilled therapeutic intervention in order to improve the following deficits and impairments:  Postural dysfunction, Decreased range of motion, Decreased knowledge of precautions, Increased edema, Increased fascial restricitons, Impaired UE functional use, Pain  Visit Diagnosis: Aftercare following surgery for neoplasm  Abnormal posture  Malignant neoplasm of upper-outer quadrant of left breast in female, estrogen receptor positive ([facility])     Problem List Patient Active Problem List   Diagnosis Date Noted   Left breast abscess 09/12/2021   Essential hypertension 09/12/2021   Lesion of pancreas 09/12/2021   Hypokalemia 09/12/2021   Genetic testing 04/08/2021   Port-A-Cath in place 04/03/2021   Family history of colon cancer 03/19/2021   Malignant neoplasm of upper-outer quadrant of left breast in female, 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   Suicide ideation 07/07/2013   Major depressive disorder, recurrent severe without psychotic features ([facility]) 07/06/2013  PHYSICAL THERAPY DISCHARGE SUMMARY  Visits from Start of Care: 7  Current functional level related to goals /  functional outcomes: Achieved all goals except quick dash remained the same at 4.55%   Remaining deficits: Some left breast fibrosis remains at breast incision and mildly at inferior breast but pt is independent in managment   Education / Equipment: Compression bra, Flexi touch, getting a prophylactic sleeve/gauntlet   Patient agrees to discharge. Patient goals were partially met. Patient is being discharged due to being pleased with the current functional level.   Waynette Buttery, PT 10/15/2021, 10:54 AM  [facility] [address] [place], [place], [address] Phone: [phone]   Fax:  [phone]  Name: Joanna Osborn MRN: [id number] Date of Birth:  [birth year]

## 2021-10-16 ENCOUNTER — Other Ambulatory Visit: Payer: Federal, State, Local not specified - PPO

## 2021-10-16 ENCOUNTER — Ambulatory Visit
Admission: RE | Admit: 2021-10-16 | Discharge: 2021-10-16 | Disposition: A | Discharge status: Home / Self Care | Payer: Federal, State, Local not specified - PPO | Source: Ambulatory Visit | Attending: Radiation Oncology | Admitting: Radiation Oncology

## 2021-10-16 ENCOUNTER — Inpatient Hospital Stay: Payer: Federal, State, Local not specified - PPO

## 2021-10-16 VITALS — BP 138/95 | HR 90 | Temp 98.9°F | Resp 18 | Wt 212.5 lb

## 2021-10-16 DIAGNOSIS — Z510 Encounter for antineoplastic radiation therapy: Secondary | ICD-10-CM | POA: Diagnosis not present

## 2021-10-16 DIAGNOSIS — Z170 Estrogen receptor positive status [ER+]: Secondary | ICD-10-CM | POA: Diagnosis not present

## 2021-10-16 DIAGNOSIS — Z5112 Encounter for antineoplastic immunotherapy: Secondary | ICD-10-CM | POA: Diagnosis not present

## 2021-10-16 DIAGNOSIS — C50412 Malignant neoplasm of upper-outer quadrant of left female breast: Secondary | ICD-10-CM

## 2021-10-16 MED ORDER — TRASTUZUMAB-HYALURONIDASE-OYSK 600-10000 MG-UNT/5ML ~~LOC~~ SOLN
600.0000 mg | Freq: Once | SUBCUTANEOUS | Status: AC
  Administered 2021-10-16: 600 mg via SUBCUTANEOUS
  Filled 2021-10-16: qty 5

## 2021-10-16 MED ORDER — DIPHENHYDRAMINE HCL 25 MG PO CAPS
50.0000 mg | ORAL_CAPSULE | Freq: Once | ORAL | Status: AC
  Administered 2021-10-16: 50 mg via ORAL
  Filled 2021-10-16: qty 2

## 2021-10-16 MED ORDER — ACETAMINOPHEN 325 MG PO TABS
650.0000 mg | ORAL_TABLET | Freq: Once | ORAL | Status: AC
  Administered 2021-10-16: 650 mg via ORAL
  Filled 2021-10-16: qty 2

## 2021-10-16 NOTE — Patient Instructions (Signed)
Goserelin injection °What is this medication? °GOSERELIN (GOE se rel in) is similar to a hormone found in the body. It lowers the amount of sex hormones that the body makes. Men will have lower testosterone levels and women will have lower estrogen levels while taking this medicine. In men, this medicine is used to treat prostate cancer; the injection is either given once per month or once every 12 weeks. A once per month injection (only) is used to treat women with endometriosis, dysfunctional uterine bleeding, or advanced breast cancer. °This medicine may be used for other purposes; ask your health care provider or pharmacist if you have questions. °COMMON BRAND NAME(S): Zoladex, Zoladex 3-Month °What should I tell my care team before I take this medication? °They need to know if you have any of these conditions: °bone problems °diabetes °heart disease °history of irregular heartbeat °an unusual or allergic reaction to goserelin, other medicines, foods, dyes, or preservatives °pregnant or trying to get pregnant °breast-feeding °How should I use this medication? °This medicine is for injection under the skin. It is given by a health care professional in a hospital or clinic setting. °Talk to your pediatrician regarding the use of this medicine in children. Special care may be needed. °Overdosage: If you think you have taken too much of this medicine contact a poison control center or emergency room at once. °NOTE: This medicine is only for you. Do not share this medicine with others. °What if I miss a dose? °It is important not to miss your dose. Call your doctor or health care professional if you are unable to keep an appointment. °What may interact with this medication? °Do not take this medicine with any of the following medications: °cisapride °dronedarone °pimozide °thioridazine °This medicine may also interact with the following medications: °other medicines that prolong the QT interval (an abnormal heart  rhythm) °This list may not describe all possible interactions. Give your health care provider a list of all the medicines, herbs, non-prescription drugs, or dietary supplements you use. Also tell them if you smoke, drink alcohol, or use illegal drugs. Some items may interact with your medicine. °What should I watch for while using this medication? °Visit your doctor or health care provider for regular checks on your progress. Your symptoms may appear to get worse during the first weeks of this therapy. Tell your doctor or healthcare provider if your symptoms do not start to get better or if they get worse after this time. °Your bones may get weaker if you take this medicine for a long time. If you smoke or frequently drink alcohol you may increase your risk of bone loss. A family history of osteoporosis, chronic use of drugs for seizures (convulsions), or corticosteroids can also increase your risk of bone loss. Talk to your doctor about how to keep your bones strong. °This medicine should stop regular monthly menstruation in women. Tell your doctor if you continue to menstruate. °Women should not become pregnant while taking this medicine or for 12 weeks after stopping this medicine. Women should inform their doctor if they wish to become pregnant or think they might be pregnant. There is a potential for serious side effects to an unborn child. Talk to your health care professional or pharmacist for more information. Do not breast-feed an infant while taking this medicine. °Men should inform their doctors if they wish to father a child. This medicine may lower sperm counts. Talk to your health care professional or pharmacist for more information. °This   medicine may increase blood sugar. Ask your healthcare provider if changes in diet or medicines are needed if you have diabetes. °What side effects may I notice from receiving this medication? °Side effects that you should report to your doctor or health care  professional as soon as possible: °allergic reactions like skin rash, itching or hives, swelling of the face, lips, or tongue °bone pain °breathing problems °changes in vision °chest pain °feeling faint or lightheaded, falls °fever, chills °pain, swelling, warmth in the leg °pain, tingling, numbness in the hands or feet °signs and symptoms of high blood sugar such as being more thirsty or hungry or having to urinate more than normal. You may also feel very tired or have blurry vision °signs and symptoms of low blood pressure like dizziness; feeling faint or lightheaded, falls; unusually weak or tired °stomach pain °swelling of the ankles, feet, hands °trouble passing urine or change in the amount of urine °unusually high or low blood pressure °unusually weak or tired °Side effects that usually do not require medical attention (report to your doctor or health care professional if they continue or are bothersome): °change in sex drive or performance °changes in breast size in both males and females °changes in emotions or moods °headache °hot flashes °irritation at site where injected °loss of appetite °skin problems like acne, dry skin °vaginal dryness °This list may not describe all possible side effects. Call your doctor for medical advice about side effects. You may report side effects to FDA at 1-800-FDA-1088. °Where should I keep my medication? °This drug is given in a hospital or clinic and will not be stored at home. °NOTE: This sheet is a summary. It may not cover all possible information. If you have questions about this medicine, talk to your doctor, pharmacist, or health care provider. °© 2022 [employer] (2018-11-11 00:00:00) ° °

## 2021-10-17 ENCOUNTER — Other Ambulatory Visit: Payer: Self-pay

## 2021-10-17 ENCOUNTER — Ambulatory Visit
Admission: RE | Admit: 2021-10-17 | Discharge: 2021-10-17 | Disposition: A | Discharge status: Home / Self Care | Payer: Federal, State, Local not specified - PPO | Source: Ambulatory Visit | Attending: Radiation Oncology | Admitting: Radiation Oncology

## 2021-10-17 DIAGNOSIS — Z510 Encounter for antineoplastic radiation therapy: Secondary | ICD-10-CM | POA: Diagnosis not present

## 2021-10-17 DIAGNOSIS — Z170 Estrogen receptor positive status [ER+]: Secondary | ICD-10-CM | POA: Diagnosis not present

## 2021-10-17 DIAGNOSIS — C50412 Malignant neoplasm of upper-outer quadrant of left female breast: Secondary | ICD-10-CM | POA: Diagnosis not present

## 2021-10-17 DIAGNOSIS — Z5112 Encounter for antineoplastic immunotherapy: Secondary | ICD-10-CM | POA: Diagnosis not present

## 2021-10-20 ENCOUNTER — Ambulatory Visit
Admission: RE | Admit: 2021-10-20 | Discharge: 2021-10-20 | Disposition: A | Discharge status: Home / Self Care | Payer: Federal, State, Local not specified - PPO | Source: Ambulatory Visit | Attending: Radiation Oncology | Admitting: Radiation Oncology

## 2021-10-20 DIAGNOSIS — Z510 Encounter for antineoplastic radiation therapy: Secondary | ICD-10-CM | POA: Diagnosis not present

## 2021-10-20 DIAGNOSIS — C50412 Malignant neoplasm of upper-outer quadrant of left female breast: Secondary | ICD-10-CM | POA: Diagnosis not present

## 2021-10-20 DIAGNOSIS — Z5112 Encounter for antineoplastic immunotherapy: Secondary | ICD-10-CM | POA: Diagnosis not present

## 2021-10-20 DIAGNOSIS — Z170 Estrogen receptor positive status [ER+]: Secondary | ICD-10-CM | POA: Diagnosis not present

## 2021-10-21 ENCOUNTER — Ambulatory Visit
Admission: RE | Admit: 2021-10-21 | Discharge: 2021-10-21 | Disposition: A | Discharge status: Home / Self Care | Payer: Federal, State, Local not specified - PPO | Source: Ambulatory Visit | Attending: Radiation Oncology | Admitting: Radiation Oncology

## 2021-10-21 ENCOUNTER — Other Ambulatory Visit: Payer: Self-pay

## 2021-10-21 DIAGNOSIS — Z5112 Encounter for antineoplastic immunotherapy: Secondary | ICD-10-CM | POA: Diagnosis not present

## 2021-10-21 DIAGNOSIS — Z170 Estrogen receptor positive status [ER+]: Secondary | ICD-10-CM | POA: Diagnosis not present

## 2021-10-21 DIAGNOSIS — Z510 Encounter for antineoplastic radiation therapy: Secondary | ICD-10-CM | POA: Diagnosis not present

## 2021-10-21 DIAGNOSIS — C50412 Malignant neoplasm of upper-outer quadrant of left female breast: Secondary | ICD-10-CM | POA: Diagnosis not present

## 2021-10-22 ENCOUNTER — Ambulatory Visit
Admission: RE | Admit: 2021-10-22 | Discharge: 2021-10-22 | Disposition: A | Discharge status: Home / Self Care | Payer: Federal, State, Local not specified - PPO | Source: Ambulatory Visit | Attending: Radiation Oncology | Admitting: Radiation Oncology

## 2021-10-22 DIAGNOSIS — Z5112 Encounter for antineoplastic immunotherapy: Secondary | ICD-10-CM | POA: Diagnosis not present

## 2021-10-22 DIAGNOSIS — Z170 Estrogen receptor positive status [ER+]: Secondary | ICD-10-CM | POA: Diagnosis not present

## 2021-10-22 DIAGNOSIS — Z510 Encounter for antineoplastic radiation therapy: Secondary | ICD-10-CM | POA: Diagnosis not present

## 2021-10-22 DIAGNOSIS — C50412 Malignant neoplasm of upper-outer quadrant of left female breast: Secondary | ICD-10-CM | POA: Diagnosis not present

## 2021-10-23 ENCOUNTER — Other Ambulatory Visit: Payer: Self-pay

## 2021-10-23 ENCOUNTER — Ambulatory Visit
Admission: RE | Admit: 2021-10-23 | Discharge: 2021-10-23 | Disposition: A | Discharge status: Home / Self Care | Payer: Federal, State, Local not specified - PPO | Source: Ambulatory Visit | Attending: Radiation Oncology | Admitting: Radiation Oncology

## 2021-10-23 DIAGNOSIS — C50412 Malignant neoplasm of upper-outer quadrant of left female breast: Secondary | ICD-10-CM | POA: Diagnosis not present

## 2021-10-23 DIAGNOSIS — Z170 Estrogen receptor positive status [ER+]: Secondary | ICD-10-CM | POA: Diagnosis not present

## 2021-10-23 DIAGNOSIS — Z5112 Encounter for antineoplastic immunotherapy: Secondary | ICD-10-CM | POA: Diagnosis not present

## 2021-10-23 DIAGNOSIS — Z510 Encounter for antineoplastic radiation therapy: Secondary | ICD-10-CM | POA: Diagnosis not present

## 2021-10-24 ENCOUNTER — Ambulatory Visit
Admission: RE | Admit: 2021-10-24 | Discharge: 2021-10-24 | Disposition: A | Discharge status: Home / Self Care | Payer: Federal, State, Local not specified - PPO | Source: Ambulatory Visit | Attending: Radiation Oncology | Admitting: Radiation Oncology

## 2021-10-24 DIAGNOSIS — Z5112 Encounter for antineoplastic immunotherapy: Secondary | ICD-10-CM | POA: Diagnosis not present

## 2021-10-24 DIAGNOSIS — Z170 Estrogen receptor positive status [ER+]: Secondary | ICD-10-CM | POA: Diagnosis not present

## 2021-10-24 DIAGNOSIS — C50412 Malignant neoplasm of upper-outer quadrant of left female breast: Secondary | ICD-10-CM | POA: Diagnosis not present

## 2021-10-24 DIAGNOSIS — Z510 Encounter for antineoplastic radiation therapy: Secondary | ICD-10-CM | POA: Diagnosis not present

## 2021-10-26 DIAGNOSIS — Z5112 Encounter for antineoplastic immunotherapy: Secondary | ICD-10-CM | POA: Insufficient documentation

## 2021-10-26 DIAGNOSIS — Z510 Encounter for antineoplastic radiation therapy: Secondary | ICD-10-CM | POA: Insufficient documentation

## 2021-10-26 DIAGNOSIS — C50412 Malignant neoplasm of upper-outer quadrant of left female breast: Secondary | ICD-10-CM | POA: Diagnosis not present

## 2021-10-26 DIAGNOSIS — Z170 Estrogen receptor positive status [ER+]: Secondary | ICD-10-CM | POA: Insufficient documentation

## 2021-10-27 ENCOUNTER — Other Ambulatory Visit: Payer: Self-pay

## 2021-10-27 ENCOUNTER — Ambulatory Visit
Admission: RE | Admit: 2021-10-27 | Discharge: 2021-10-27 | Disposition: A | Discharge status: Home / Self Care | Payer: Federal, State, Local not specified - PPO | Source: Ambulatory Visit | Attending: Radiation Oncology | Admitting: Radiation Oncology

## 2021-10-27 DIAGNOSIS — C50412 Malignant neoplasm of upper-outer quadrant of left female breast: Secondary | ICD-10-CM | POA: Diagnosis not present

## 2021-10-27 DIAGNOSIS — Z510 Encounter for antineoplastic radiation therapy: Secondary | ICD-10-CM | POA: Diagnosis not present

## 2021-10-27 DIAGNOSIS — Z5112 Encounter for antineoplastic immunotherapy: Secondary | ICD-10-CM | POA: Diagnosis not present

## 2021-10-27 DIAGNOSIS — Z170 Estrogen receptor positive status [ER+]: Secondary | ICD-10-CM | POA: Diagnosis not present

## 2021-10-28 ENCOUNTER — Ambulatory Visit
Admission: RE | Admit: 2021-10-28 | Discharge: 2021-10-28 | Disposition: A | Discharge status: Home / Self Care | Payer: Federal, State, Local not specified - PPO | Source: Ambulatory Visit | Attending: Radiation Oncology | Admitting: Radiation Oncology

## 2021-10-28 DIAGNOSIS — Z170 Estrogen receptor positive status [ER+]: Secondary | ICD-10-CM | POA: Diagnosis not present

## 2021-10-28 DIAGNOSIS — Z510 Encounter for antineoplastic radiation therapy: Secondary | ICD-10-CM | POA: Diagnosis not present

## 2021-10-28 DIAGNOSIS — C50412 Malignant neoplasm of upper-outer quadrant of left female breast: Secondary | ICD-10-CM | POA: Diagnosis not present

## 2021-10-28 DIAGNOSIS — Z5112 Encounter for antineoplastic immunotherapy: Secondary | ICD-10-CM | POA: Diagnosis not present

## 2021-10-29 ENCOUNTER — Ambulatory Visit
Admission: RE | Admit: 2021-10-29 | Discharge: 2021-10-29 | Disposition: A | Discharge status: Home / Self Care | Payer: Federal, State, Local not specified - PPO | Source: Ambulatory Visit | Attending: Radiation Oncology | Admitting: Radiation Oncology

## 2021-10-29 ENCOUNTER — Other Ambulatory Visit: Payer: Self-pay

## 2021-10-29 DIAGNOSIS — Z510 Encounter for antineoplastic radiation therapy: Secondary | ICD-10-CM | POA: Diagnosis not present

## 2021-10-29 DIAGNOSIS — Z5112 Encounter for antineoplastic immunotherapy: Secondary | ICD-10-CM | POA: Diagnosis not present

## 2021-10-29 DIAGNOSIS — C50412 Malignant neoplasm of upper-outer quadrant of left female breast: Secondary | ICD-10-CM | POA: Diagnosis not present

## 2021-10-29 DIAGNOSIS — Z170 Estrogen receptor positive status [ER+]: Secondary | ICD-10-CM | POA: Diagnosis not present

## 2021-10-30 ENCOUNTER — Ambulatory Visit
Admission: RE | Admit: 2021-10-30 | Discharge: 2021-10-30 | Disposition: A | Discharge status: Home / Self Care | Payer: Federal, State, Local not specified - PPO | Source: Ambulatory Visit | Attending: Radiation Oncology | Admitting: Radiation Oncology

## 2021-10-30 DIAGNOSIS — Z510 Encounter for antineoplastic radiation therapy: Secondary | ICD-10-CM | POA: Diagnosis not present

## 2021-10-30 DIAGNOSIS — Z170 Estrogen receptor positive status [ER+]: Secondary | ICD-10-CM | POA: Diagnosis not present

## 2021-10-30 DIAGNOSIS — Z5112 Encounter for antineoplastic immunotherapy: Secondary | ICD-10-CM | POA: Diagnosis not present

## 2021-10-30 DIAGNOSIS — C50412 Malignant neoplasm of upper-outer quadrant of left female breast: Secondary | ICD-10-CM | POA: Diagnosis not present

## 2021-10-31 ENCOUNTER — Ambulatory Visit
Admission: RE | Admit: 2021-10-31 | Discharge: 2021-10-31 | Disposition: A | Discharge status: Home / Self Care | Payer: Federal, State, Local not specified - PPO | Source: Ambulatory Visit | Attending: Radiation Oncology | Admitting: Radiation Oncology

## 2021-10-31 ENCOUNTER — Other Ambulatory Visit: Payer: Self-pay

## 2021-10-31 DIAGNOSIS — Z170 Estrogen receptor positive status [ER+]: Secondary | ICD-10-CM | POA: Diagnosis not present

## 2021-10-31 DIAGNOSIS — C50412 Malignant neoplasm of upper-outer quadrant of left female breast: Secondary | ICD-10-CM | POA: Diagnosis not present

## 2021-10-31 DIAGNOSIS — Z5112 Encounter for antineoplastic immunotherapy: Secondary | ICD-10-CM | POA: Diagnosis not present

## 2021-10-31 DIAGNOSIS — Z510 Encounter for antineoplastic radiation therapy: Secondary | ICD-10-CM | POA: Diagnosis not present

## 2021-11-03 ENCOUNTER — Other Ambulatory Visit: Payer: Self-pay

## 2021-11-03 ENCOUNTER — Ambulatory Visit
Admission: RE | Admit: 2021-11-03 | Discharge: 2021-11-03 | Disposition: A | Discharge status: Home / Self Care | Payer: Federal, State, Local not specified - PPO | Source: Ambulatory Visit | Attending: Radiation Oncology | Admitting: Radiation Oncology

## 2021-11-03 DIAGNOSIS — C50412 Malignant neoplasm of upper-outer quadrant of left female breast: Secondary | ICD-10-CM | POA: Diagnosis not present

## 2021-11-03 DIAGNOSIS — Z5112 Encounter for antineoplastic immunotherapy: Secondary | ICD-10-CM | POA: Diagnosis not present

## 2021-11-03 DIAGNOSIS — Z170 Estrogen receptor positive status [ER+]: Secondary | ICD-10-CM | POA: Diagnosis not present

## 2021-11-03 DIAGNOSIS — Z510 Encounter for antineoplastic radiation therapy: Secondary | ICD-10-CM | POA: Diagnosis not present

## 2021-11-04 ENCOUNTER — Ambulatory Visit
Admission: RE | Admit: 2021-11-04 | Discharge: 2021-11-04 | Disposition: A | Discharge status: Home / Self Care | Payer: Federal, State, Local not specified - PPO | Source: Ambulatory Visit | Attending: Radiation Oncology | Admitting: Radiation Oncology

## 2021-11-04 DIAGNOSIS — Z170 Estrogen receptor positive status [ER+]: Secondary | ICD-10-CM | POA: Diagnosis not present

## 2021-11-04 DIAGNOSIS — Z5112 Encounter for antineoplastic immunotherapy: Secondary | ICD-10-CM | POA: Diagnosis not present

## 2021-11-04 DIAGNOSIS — C50412 Malignant neoplasm of upper-outer quadrant of left female breast: Secondary | ICD-10-CM | POA: Diagnosis not present

## 2021-11-04 DIAGNOSIS — Z510 Encounter for antineoplastic radiation therapy: Secondary | ICD-10-CM | POA: Diagnosis not present

## 2021-11-05 ENCOUNTER — Ambulatory Visit
Admission: RE | Admit: 2021-11-05 | Discharge: 2021-11-05 | Disposition: A | Discharge status: Home / Self Care | Payer: Federal, State, Local not specified - PPO | Source: Ambulatory Visit | Attending: Radiation Oncology | Admitting: Radiation Oncology

## 2021-11-05 ENCOUNTER — Other Ambulatory Visit: Payer: Self-pay

## 2021-11-05 DIAGNOSIS — C50412 Malignant neoplasm of upper-outer quadrant of left female breast: Secondary | ICD-10-CM | POA: Diagnosis not present

## 2021-11-05 DIAGNOSIS — Z170 Estrogen receptor positive status [ER+]: Secondary | ICD-10-CM | POA: Diagnosis not present

## 2021-11-05 DIAGNOSIS — Z510 Encounter for antineoplastic radiation therapy: Secondary | ICD-10-CM | POA: Diagnosis not present

## 2021-11-05 DIAGNOSIS — Z5112 Encounter for antineoplastic immunotherapy: Secondary | ICD-10-CM | POA: Diagnosis not present

## 2021-11-06 ENCOUNTER — Ambulatory Visit
Admission: RE | Admit: 2021-11-06 | Discharge: 2021-11-06 | Disposition: A | Discharge status: Home / Self Care | Payer: Federal, State, Local not specified - PPO | Source: Ambulatory Visit | Attending: Radiation Oncology | Admitting: Radiation Oncology

## 2021-11-06 ENCOUNTER — Other Ambulatory Visit: Payer: Self-pay

## 2021-11-06 DIAGNOSIS — Z170 Estrogen receptor positive status [ER+]: Secondary | ICD-10-CM | POA: Diagnosis not present

## 2021-11-06 DIAGNOSIS — Z5112 Encounter for antineoplastic immunotherapy: Secondary | ICD-10-CM | POA: Diagnosis not present

## 2021-11-06 DIAGNOSIS — Z510 Encounter for antineoplastic radiation therapy: Secondary | ICD-10-CM | POA: Diagnosis not present

## 2021-11-06 DIAGNOSIS — C50412 Malignant neoplasm of upper-outer quadrant of left female breast: Secondary | ICD-10-CM | POA: Diagnosis not present

## 2021-11-07 ENCOUNTER — Other Ambulatory Visit: Payer: Self-pay

## 2021-11-07 ENCOUNTER — Inpatient Hospital Stay: Payer: Federal, State, Local not specified - PPO

## 2021-11-07 ENCOUNTER — Ambulatory Visit
Admission: RE | Admit: 2021-11-07 | Discharge: 2021-11-07 | Disposition: A | Discharge status: Home / Self Care | Payer: Federal, State, Local not specified - PPO | Source: Ambulatory Visit | Attending: Radiation Oncology | Admitting: Radiation Oncology

## 2021-11-07 ENCOUNTER — Inpatient Hospital Stay (HOSPITAL_BASED_OUTPATIENT_CLINIC_OR_DEPARTMENT_OTHER): Payer: Federal, State, Local not specified - PPO | Admitting: Hematology

## 2021-11-07 ENCOUNTER — Ambulatory Visit
Admission: RE | Admit: 2021-11-07 | Payer: Federal, State, Local not specified - PPO | Source: Ambulatory Visit | Admitting: Radiation Oncology

## 2021-11-07 VITALS — BP 108/69 | HR 91 | Temp 98.5°F | Resp 18 | Ht 65.0 in | Wt 214.2 lb

## 2021-11-07 DIAGNOSIS — Z510 Encounter for antineoplastic radiation therapy: Secondary | ICD-10-CM | POA: Diagnosis not present

## 2021-11-07 DIAGNOSIS — Z170 Estrogen receptor positive status [ER+]: Secondary | ICD-10-CM | POA: Diagnosis not present

## 2021-11-07 DIAGNOSIS — Z5112 Encounter for antineoplastic immunotherapy: Secondary | ICD-10-CM | POA: Insufficient documentation

## 2021-11-07 DIAGNOSIS — C50412 Malignant neoplasm of upper-outer quadrant of left female breast: Secondary | ICD-10-CM

## 2021-11-07 DIAGNOSIS — Z95828 Presence of other vascular implants and grafts: Secondary | ICD-10-CM

## 2021-11-07 DIAGNOSIS — Z5111 Encounter for antineoplastic chemotherapy: Secondary | ICD-10-CM | POA: Insufficient documentation

## 2021-11-07 LAB — CMP (CANCER CENTER ONLY)
ALT: 16 U/L (ref 0–44)
AST: 16 U/L (ref 15–41)
Albumin: 4.2 g/dL (ref 3.5–5.0)
Alkaline Phosphatase: 60 U/L (ref 38–126)
Anion gap: 6 (ref 5–15)
BUN: 19 mg/dL (ref 6–20)
CO2: 28 mmol/L (ref 22–32)
Calcium: 10 mg/dL (ref 8.9–10.3)
Chloride: 104 mmol/L (ref 98–111)
Creatinine: 0.77 mg/dL (ref 0.44–1.00)
GFR, Estimated: >60 mL/min (ref >60)
Glucose, Bld: 97 mg/dL (ref 70–99)
Potassium: 3.8 mmol/L (ref 3.5–5.1)
Sodium: 138 mmol/L (ref 135–145)
Total Bilirubin: 0.6 mg/dL (ref 0.3–1.2)
Total Protein: 7.4 g/dL (ref 6.5–8.1)

## 2021-11-07 LAB — CBC WITH DIFFERENTIAL (CANCER CENTER ONLY)
Abs Immature Granulocytes: 0.01 10*3/uL (ref 0.00–0.07)
Basophils Absolute: 0 10*3/uL (ref 0.0–0.1)
Basophils Relative: 1 %
Eosinophils Absolute: 0.1 10*3/uL (ref 0.0–0.5)
Eosinophils Relative: 2 %
HCT: 35.6 % — ABNORMAL LOW (ref 36.0–46.0)
Hemoglobin: 11.5 g/dL — ABNORMAL LOW (ref 12.0–15.0)
Immature Granulocytes: 0 %
Lymphocytes Relative: 28 %
Lymphs Abs: 1.2 10*3/uL (ref 0.7–4.0)
MCH: 28.8 pg (ref 26.0–34.0)
MCHC: 32.3 g/dL (ref 30.0–36.0)
MCV: 89 fL (ref 80.0–100.0)
Monocytes Absolute: 0.4 10*3/uL (ref 0.1–1.0)
Monocytes Relative: 8 %
Neutro Abs: 2.7 10*3/uL (ref 1.7–7.7)
Neutrophils Relative %: 61 %
Platelet Count: 317 10*3/uL (ref 150–400)
RBC: 4 MIL/uL (ref 3.87–5.11)
RDW: 14.4 % (ref 11.5–15.5)
WBC Count: 4.4 10*3/uL (ref 4.0–10.5)
nRBC: 0 % (ref 0.0–0.2)

## 2021-11-07 MED ORDER — GOSERELIN ACETATE 3.6 MG ~~LOC~~ IMPL
3.6000 mg | DRUG_IMPLANT | Freq: Once | SUBCUTANEOUS | Status: AC
  Administered 2021-11-07: 3.6 mg via SUBCUTANEOUS
  Filled 2021-11-07: qty 3.6

## 2021-11-07 MED ORDER — ACETAMINOPHEN 325 MG PO TABS
650.0000 mg | ORAL_TABLET | Freq: Once | ORAL | Status: AC
  Administered 2021-11-07: 650 mg via ORAL
  Filled 2021-11-07: qty 2

## 2021-11-07 MED ORDER — SODIUM CHLORIDE 0.9 % IV SOLN: Freq: Once | INTRAVENOUS | Status: AC

## 2021-11-07 MED ORDER — TRASTUZUMAB-DKST CHEMO 150 MG IV SOLR
6.0000 mg/kg | Freq: Once | INTRAVENOUS | Status: AC
  Administered 2021-11-07: 567 mg via INTRAVENOUS
  Filled 2021-11-07: qty 27

## 2021-11-07 MED ORDER — SODIUM CHLORIDE 0.9% FLUSH
10.0000 mL | Freq: Once | INTRAVENOUS | Status: AC
  Administered 2021-11-07: 10 mL

## 2021-11-07 MED ORDER — HEPARIN SOD (PORK) LOCK FLUSH 100 UNIT/ML IV SOLN
500.0000 [IU] | Freq: Once | INTRAVENOUS | Status: AC
  Administered 2021-11-07: 500 [IU]

## 2021-11-07 MED ORDER — DIPHENHYDRAMINE HCL 25 MG PO CAPS
50.0000 mg | ORAL_CAPSULE | Freq: Once | ORAL | Status: AC
  Administered 2021-11-07: 50 mg via ORAL
  Filled 2021-11-07: qty 2

## 2021-11-07 NOTE — Progress Notes (Signed)
?[facility]   ?[phone] [phone]   ?Clinic Follow up Note  ? ?Patient Care Team: ?Lennie Odor, PA as PCP - General (Physician Assistant) ?Rockwell Germany, RN as Oncology Nurse Navigator ?Mauro Kaufmann, RN as Oncology Nurse Navigator ?Rolm Bookbinder, MD as Consulting Physician (General Surgery) ?Truitt Merle, MD as Consulting Physician (Hematology) ?Kyung Rudd, MD as Consulting Physician (Radiation Oncology) ?Truitt Merle, MD as Consulting Physician (Hematology) ? ?Date of Service:  11/07/2021 ? ?CHIEF COMPLAINT: f/u of left breast cancer ? ?CURRENT THERAPY:  ?Maintenance herceptin with GCSF, starting 08/14/21 ? ?ASSESSMENT & PLAN:  ?Joanna Osborn is a 51 y.o. pre-menopausal female with  ? ?1. 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 Left mammogram on 03/05/21 showed 2.1 cm mass at 12 o'clock. Biopsy confirmed invasive ductal carcinoma, grade 3, weakly ER+ and Her2+ ?-Breast MRI 03/26/21 showed additional areas of concern-- a left breast/axilla lymph node, and a right breast mass. Right breast biopsy 04/23/21 showed complex sclerosing lesion. ?-echo from 04/02/21 reviewed, no concerns. Repeat 09/03/21 stable. ?-She received 6 cycles of neoadjuvant TCHP with G-CSF 9/8-12/29/22. Docetaxel dose reduced from C4 due to neuropathy. ?-post-treatment breast MRI on 07/25/21 showed new 8 mm right breast mass with overall benign features. Biopsy on 08/07/21 was benign. ?-she moved to maintenance HP on 08/14/21.  ?-she underwent lumpectomy on 08/25/21 with Dr. Donne Hazel. She had a complete response, no residual disease seen. Given her good response, she will continue maintenance with herceptin only. ?-she received herceptin injection for cycles 3 and 4, but she asks today about returning to infusion. She reports burning from the injection. ?-we reviewed the role of ovarian suppression,  she is interested in BSO.  I plan to start her on Zoladex today, and plan to start aromatase inhibitor after she finishes radiation. ?-Labs reviewed, adequate for treatment ? ?2. Pancreas lesion ?-noted on CT AP on 09/12/21 for work up of diarrhea and abdominal pain. ?-I will order MRI to further evaluate this. ?  ?3. Mild Anemia ?-her hgb has dropped recently secondary to chemo  ?-she still has periods, bleeding is light ?-She began oral iron ?-she received blood transfusion on 07/18/21. ?-hgb improved to 11.5 today (11/07/21) ?  ?4. Chemo induced peripheral neuropathy G1 ?-mild in her toes, from docetaxel ?-I previously recommended a B complex once daily ? ?5. Anxiety and depression, HTN ?-continue HCTZ and f/u with PCP ?  ?  ?PLAN: ?-proceed with trastuzumab infusion today and every 3 weeks ?-continue daily radiation through 4/24 ?-Zoladex injection today and every 4 weeks ?-abdomen MRI to be done in next few weeks ?-f/u in 4 weeks ? ? ?No problem-specific Assessment & Plan notes found for this encounter. ? ? ?SUMMARY OF ONCOLOGIC HISTORY: ?Oncology History Overview Note  ? Cancer Staging  ?Malignant neoplasm of upper-outer quadrant of left breast in female, estrogen receptor positive ([place]) ?Staging form: Breast, AJCC 8th Edition ?- Clinical stage from 03/07/2021: Stage IB (cT2, cN0, cM0, G3, ER+, PR+, HER2+) - Signed by Truitt Merle, MD on 03/17/2021 ?Stage prefix: Initial diagnosis ?Histologic grading system: 3 grade system ?- Pathologic stage from 08/25/2021: No Stage Recommended (ypT0, pN0, cM0, G3, ER+, PR+, HER2+) - Signed by Truitt Merle, MD on 09/05/2021 ?Stage prefix: Post-therapy ?Response to neoadjuvant therapy: Complete response ?Nuclear grade: G3 ?Histologic grading system: 3 grade system ? ? ?  ?Malignant neoplasm of upper-outer quadrant of left  breast in female, estrogen receptor positive ([place])  ?03/05/2021 Mammogram  ? Diagnostic Left Mammogram; Left Breast Ultrasound ? ?IMPRESSION: ?At the palpable site of  concern in the left breast at 12 o'clock ?there is a suspicious mass measuring 2.1 cm. ?  ?03/07/2021 Cancer Staging  ? Staging form: Breast, AJCC 8th Edition ?- Clinical stage from 03/07/2021: Stage IB (cT2, cN0, cM0, G3, ER+, PR+, HER2+) - Signed by Truitt Merle, MD on 03/17/2021 ?Stage prefix: Initial diagnosis ?Histologic grading system: 3 grade system ? ?  ?03/07/2021 Pathology Results  ? Diagnosis ?Breast, left, needle core biopsy, left breast 12 o' clock ?- INVASIVE DUCTAL CARCINOMA ?- SEE COMMENT ?Based on the biopsy, the carcinoma appears Nottingham grade 3 of 3 ? ?PROGNOSTIC INDICATORS ?Results: ?IMMUNOHISTOCHEMICAL AND MORPHOMETRIC ANALYSIS PERFORMED MANUALLY ?The tumor cells are POSITIVE for Her2 (3+). ?Estrogen Receptor: 30%, POSITIVE, WEAK STAINING INTENSITY ?Progesterone Receptor: 15%, POSITIVE, MODERATE STAINING INTENSITY ?Proliferation Marker Ki67: 40% ? ? ?  ?03/13/2021 Initial Diagnosis  ? Malignant neoplasm of upper-outer quadrant of left breast in female, estrogen receptor positive ([place]) ?  ?03/26/2021 Imaging  ? MRI Breast ? ?IMPRESSION: ?1. 2.5 cm biopsy-proven malignancy within the UPPER LEFT breast. ?2. Abnormal lymph node with focal cortical thickening in the ?posterior UPPER-OUTER LEFT breast/LOWER LEFT axilla. 2nd-look ultrasound with possible biopsy is recommended. ?3. Indeterminate 0.8 cm OUTER RIGHT breast mass. 2nd-look ultrasound with possible biopsy is recommended. ?  ?03/29/2021 Genetic Testing  ? Negative hereditary cancer genetic testing: no pathogenic variants detected in [facility] CustomNext-Cancer +RNAinsight Panel.  The report date is March 29, 2021.  ? ?The CustomNext-Cancer+RNAinsight panel offered by [facility] includes sequencing and rearrangement analysis for the following 47 genes:  APC, ATM, AXIN2, BARD1, BMPR1A, BRCA1, BRCA2, BRIP1, CDH1, CDK4, CDKN2A, CHEK2, DICER1, EPCAM, GREM1, HOXB13, MEN1, MLH1, MSH2, MSH3, MSH6, MUTYH, NBN, NF1, NF2, NTHL1, PALB2, PMS2, POLD1, POLE,  PTEN, RAD51C, RAD51D, RECQL, RET, SDHA, SDHAF2, SDHB, SDHC, SDHD, SMAD4, SMARCA4, STK11, TP53, TSC1, TSC2, and VHL.  RNA data is routinely analyzed for use in variant interpretation for all genes. ?  ?04/04/2021 - 07/26/2021 Chemotherapy  ? Patient is on Treatment Plan : BREAST  Docetaxel + Carboplatin + Trastuzumab + Pertuzumab  (TCHP) q21d   ?   ?04/14/2021 Imaging  ? [place] Bilateral Breast ? ?IMPRESSION: ?1. No sonographic correlate for the 8 mm enhancing mass in the outer right breast. Recommendation is to prior seed with MRI guided biopsy. ?2. An undulating, low lying lymph node along the 1 o'clock axis of ?the left breast demonstrates no areas of cortical thickening beyond ?3 mm. ?  ?08/14/2021 -  Chemotherapy  ? Patient is on Treatment Plan : BREAST Trastuzumab + Pertuzumab q21d  ?   ?08/25/2021 Cancer Staging  ? Staging form: Breast, AJCC 8th Edition ?- Pathologic stage from 08/25/2021: No Stage Recommended (ypT0, pN0, cM0, G3, ER+, PR+, HER2+) - Signed by Truitt Merle, MD on 09/05/2021 ?Stage prefix: Post-therapy ?Response to neoadjuvant therapy: Complete response ?Nuclear grade: G3 ?Histologic grading system: 3 grade system ? ?  ?08/25/2021 Definitive Surgery  ? FINAL MICROSCOPIC DIAGNOSIS:  ? ?A. BREAST, LEFT, LUMPECTOMY:  ?- No evidence of residual carcinoma - complete therapeutic response  ?- Therapy related changes, including hyalin fibrosis  ?- Fibrocystic change with urinary  ?- See comment  ? ?B. BREAST, LEFT ADDITIONAL MEDIAL MARGIN, EXCISION:  ?- Fibrocystic change  ?- Negative for carcinoma  ? ?C. BREAST, LEFT ADDITIONAL SUPERIOR MARGIN, EXCISION:  ?- Fibrocystic change  ?- Negative for carcinoma  ? ?  D. BREAST, LEFT ADDITIONAL POSTERIOR MARGIN, EXCISION:  ?- Benign breast parenchyma  ?- Negative for carcinoma  ? ?E. BREAST, LEFT ADDITIONAL ANTERIOR MARGIN, EXCISION:  ?- Benign breast parenchyma  ?- Negative for carcinoma  ? ?F. LYMPH NODE, LEFT AXILLARY, SENTINEL, EXCISION:  ?- Lymph node, negative for  carcinoma (0/1)  ? ?G. LYMPH NODE, LEFT AXILLARY, SENTINEL, EXCISION:  ?- Lymph node, negative for carcinoma (0/1)  ? ? ?COMMENT:  ?The appropriate pathologic stage (AJCC eighth edition) is ypT0 ypN0.  ?  ? ? ?

## 2021-11-07 NOTE — Patient Instructions (Addendum)
[facility]  Discharge Instructions: ?Thank you for choosing [facility] to provide your oncology and hematology care.  ? ?If you have a lab appointment with the [facility], please go directly to the [facility] and check in at the registration area. ?  ?Wear comfortable clothing and clothing appropriate for easy access to any Portacath or PICC line.  ? ?We strive to give you quality time with your provider. You may need to reschedule your appointment if you arrive late (15 or more minutes).  Arriving late affects you and other patients whose appointments are after yours.  Also, if you miss three or more appointments without notifying the office, you may be dismissed from the clinic at the provider?s discretion.    ?  ?For prescription refill requests, have your pharmacy contact our office and allow 72 hours for refills to be completed.   ? ?Today you received the following chemotherapy and/or immunotherapy agents: [place]    ?  ?To help prevent nausea and vomiting after your treatment, we encourage you to take your nausea medication as directed. ? ?BELOW ARE SYMPTOMS THAT SHOULD BE REPORTED IMMEDIATELY: ?*FEVER GREATER THAN 100.4 F (38 ?C) OR HIGHER ?*CHILLS OR SWEATING ?*NAUSEA AND VOMITING THAT IS NOT CONTROLLED WITH YOUR NAUSEA MEDICATION ?*UNUSUAL SHORTNESS OF BREATH ?*UNUSUAL BRUISING OR BLEEDING ?*URINARY PROBLEMS (pain or burning when urinating, or frequent urination) ?*BOWEL PROBLEMS (unusual diarrhea, constipation, pain near the anus) ?TENDERNESS IN MOUTH AND THROAT WITH OR WITHOUT PRESENCE OF ULCERS (sore throat, sores in mouth, or a toothache) ?UNUSUAL RASH, SWELLING OR PAIN  ?UNUSUAL VAGINAL DISCHARGE OR ITCHING  ? ?Items with * indicate a potential emergency and should be followed up as soon as possible or go to the Emergency Department if any problems should occur. ? ?Please show the CHEMOTHERAPY ALERT CARD or IMMUNOTHERAPY ALERT CARD at check-in to the  Emergency Department and triage nurse. ? ?Should you have questions after your visit or need to cancel or reschedule your appointment, please contact [facility]  Dept: [phone]  and follow the prompts.  Office hours are 8:00 a.m. to 4:30 p.m. Monday - Friday. Please note that voicemails left after 4:00 p.m. may not be returned until the following business day.  We are closed weekends and major holidays. You have access to a nurse at all times for urgent questions. Please call the main number to the clinic Dept: [phone] and follow the prompts. ? ? ?For any non-urgent questions, you may also contact your provider using MyChart. We now offer e-Visits for anyone 41 and older to request care online for non-urgent symptoms. For details visit [email or web address]. ?  ?Also download the MyChart app! Go to the app store, search "MyChart", open the app, select [facility], and log in with your MyChart username and password. ? ?Due to Covid, a mask is required upon entering the hospital/clinic. If you do not have a mask, one will be given to you upon arrival. For doctor visits, patients may have 1 support person aged 50 or older with them. For treatment visits, patients cannot have anyone with them due to current Covid guidelines and our immunocompromised population.  ? ?Goserelin injection ?What is this medication? ?GOSERELIN (GOE se rel in) is similar to a hormone found in the body. It lowers the amount of sex hormones that the body makes. Men will have lower testosterone levels and women will have lower estrogen levels while taking this medicine. In  men, this medicine is used to treat prostate cancer; the injection is either given once per month or once every 12 weeks. A once per month injection (only) is used to treat women with endometriosis, dysfunctional uterine bleeding, or advanced breast cancer. ?This medicine may be used for other purposes; ask your health care provider or  pharmacist if you have questions. ?COMMON BRAND NAME(S): Zoladex, Zoladex 32-Month ?What should I tell my care team before I take this medication? ?They need to know if you have any of these conditions: ?bone problems ?diabetes ?heart disease ?history of irregular heartbeat ?an unusual or allergic reaction to goserelin, other medicines, foods, dyes, or preservatives ?pregnant or trying to get pregnant ?breast-feeding ?How should I use this medication? ?This medicine is for injection under the skin. It is given by a health care professional in a hospital or clinic setting. ?Talk to your pediatrician regarding the use of this medicine in children. Special care may be needed. ?Overdosage: If you think you have taken too much of this medicine contact a poison control center or emergency room at once. ?NOTE: This medicine is only for you. Do not share this medicine with others. ?What if I miss a dose? ?It is important not to miss your dose. Call your doctor or health care professional if you are unable to keep an appointment. ?What may interact with this medication? ?Do not take this medicine with any of the following medications: ?cisapride ?dronedarone ?pimozide ?thioridazine ?This medicine may also interact with the following medications: ?other medicines that prolong the QT interval (an abnormal heart rhythm) ?This list may not describe all possible interactions. Give your health care provider a list of all the medicines, herbs, non-prescription drugs, or dietary supplements you use. Also tell them if you smoke, drink alcohol, or use illegal drugs. Some items may interact with your medicine. ?What should I watch for while using this medication? ?Visit your doctor or health care provider for regular checks on your progress. Your symptoms may appear to get worse during the first weeks of this therapy. Tell your doctor or healthcare provider if your symptoms do not start to get better or if they get worse after this  time. ?Your bones may get weaker if you take this medicine for a long time. If you smoke or frequently drink alcohol you may increase your risk of bone loss. A family history of osteoporosis, chronic use of drugs for seizures (convulsions), or corticosteroids can also increase your risk of bone loss. Talk to your doctor about how to keep your bones strong. ?This medicine should stop regular monthly menstruation in women. Tell your doctor if you continue to menstruate. ?Women should not become pregnant while taking this medicine or for 12 weeks after stopping this medicine. Women should inform their doctor if they wish to become pregnant or think they might be pregnant. There is a potential for serious side effects to an unborn child. Talk to your health care professional or pharmacist for more information. Do not breast-feed an infant while taking this medicine. ?Men should inform their doctors if they wish to father a child. This medicine may lower sperm counts. Talk to your health care professional or pharmacist for more information. ?This medicine may increase blood sugar. Ask your healthcare provider if changes in diet or medicines are needed if you have diabetes. ?What side effects may I notice from receiving this medication? ?Side effects that you should report to your doctor or health care professional as soon as possible: ?  allergic reactions like skin rash, itching or hives, swelling of the face, lips, or tongue ?bone pain ?breathing problems ?changes in vision ?chest pain ?feeling faint or lightheaded, falls ?fever, chills ?pain, swelling, warmth in the leg ?pain, tingling, numbness in the hands or feet ?signs and symptoms of high blood sugar such as being more thirsty or hungry or having to urinate more than normal. You may also feel very tired or have blurry vision ?signs and symptoms of low blood pressure like dizziness; feeling faint or lightheaded, falls; unusually weak or tired ?stomach pain ?swelling  of the ankles, feet, hands ?trouble passing urine or change in the amount of urine ?unusually high or low blood pressure ?unusually weak or tired ?Side effects that usually do not require medical attent

## 2021-11-07 NOTE — Progress Notes (Signed)
Confirmed today's dose trastuzumab at 6 mg/kg. ? ?Dr. Lavonda Jumbo, PharmD ?

## 2021-11-08 ENCOUNTER — Encounter: Payer: Self-pay | Admitting: Hematology

## 2021-11-10 ENCOUNTER — Other Ambulatory Visit: Payer: Self-pay

## 2021-11-10 ENCOUNTER — Ambulatory Visit
Admission: RE | Admit: 2021-11-10 | Discharge: 2021-11-10 | Disposition: A | Discharge status: Home / Self Care | Payer: Federal, State, Local not specified - PPO | Source: Ambulatory Visit | Attending: Radiation Oncology | Admitting: Radiation Oncology

## 2021-11-10 ENCOUNTER — Telehealth: Payer: Self-pay | Admitting: Hematology

## 2021-11-10 DIAGNOSIS — Z5112 Encounter for antineoplastic immunotherapy: Secondary | ICD-10-CM | POA: Diagnosis not present

## 2021-11-10 DIAGNOSIS — Z170 Estrogen receptor positive status [ER+]: Secondary | ICD-10-CM | POA: Diagnosis not present

## 2021-11-10 DIAGNOSIS — C50412 Malignant neoplasm of upper-outer quadrant of left female breast: Secondary | ICD-10-CM | POA: Diagnosis not present

## 2021-11-10 DIAGNOSIS — Z510 Encounter for antineoplastic radiation therapy: Secondary | ICD-10-CM | POA: Diagnosis not present

## 2021-11-10 NOTE — Telephone Encounter (Signed)
Scheduled follow-up appointments per 4/14 los. Patient is aware. ?

## 2021-11-11 ENCOUNTER — Other Ambulatory Visit: Payer: Self-pay

## 2021-11-11 ENCOUNTER — Ambulatory Visit
Admission: RE | Admit: 2021-11-11 | Discharge: 2021-11-11 | Disposition: A | Discharge status: Home / Self Care | Payer: Federal, State, Local not specified - PPO | Source: Ambulatory Visit | Attending: Radiation Oncology | Admitting: Radiation Oncology

## 2021-11-11 DIAGNOSIS — Z170 Estrogen receptor positive status [ER+]: Secondary | ICD-10-CM | POA: Diagnosis not present

## 2021-11-11 DIAGNOSIS — Z5112 Encounter for antineoplastic immunotherapy: Secondary | ICD-10-CM | POA: Diagnosis not present

## 2021-11-11 DIAGNOSIS — Z510 Encounter for antineoplastic radiation therapy: Secondary | ICD-10-CM | POA: Diagnosis not present

## 2021-11-11 DIAGNOSIS — C50412 Malignant neoplasm of upper-outer quadrant of left female breast: Secondary | ICD-10-CM | POA: Diagnosis not present

## 2021-11-11 LAB — RAD ONC ARIA SESSION SUMMARY
Course Elapsed Days: 40
Plan Fractions Treated to Date: 1
Plan Prescribed Dose Per Fraction: 2 Gy
Plan Total Fractions Prescribed: 5
Plan Total Prescribed Dose: 10 Gy
Reference Point Dosage Given to Date: 52.4 Gy
Reference Point Session Dosage Given: 2 Gy
Session Number: 29

## 2021-11-12 ENCOUNTER — Ambulatory Visit: Admission: RE | Admit: 2021-11-12 | Payer: Federal, State, Local not specified - PPO | Source: Ambulatory Visit

## 2021-11-12 ENCOUNTER — Other Ambulatory Visit: Payer: Self-pay

## 2021-11-13 ENCOUNTER — Encounter: Payer: Self-pay | Admitting: *Deleted

## 2021-11-13 ENCOUNTER — Ambulatory Visit
Admission: RE | Admit: 2021-11-13 | Discharge: 2021-11-13 | Disposition: A | Discharge status: Home / Self Care | Payer: Federal, State, Local not specified - PPO | Source: Ambulatory Visit | Attending: Radiation Oncology | Admitting: Radiation Oncology

## 2021-11-13 ENCOUNTER — Other Ambulatory Visit: Payer: Self-pay

## 2021-11-13 DIAGNOSIS — Z510 Encounter for antineoplastic radiation therapy: Secondary | ICD-10-CM | POA: Diagnosis not present

## 2021-11-13 DIAGNOSIS — Z5112 Encounter for antineoplastic immunotherapy: Secondary | ICD-10-CM | POA: Diagnosis not present

## 2021-11-13 DIAGNOSIS — C50412 Malignant neoplasm of upper-outer quadrant of left female breast: Secondary | ICD-10-CM | POA: Diagnosis not present

## 2021-11-13 DIAGNOSIS — Z170 Estrogen receptor positive status [ER+]: Secondary | ICD-10-CM | POA: Diagnosis not present

## 2021-11-13 LAB — RAD ONC ARIA SESSION SUMMARY
Course Elapsed Days: 42
Plan Fractions Treated to Date: 2
Plan Prescribed Dose Per Fraction: 2 Gy
Plan Total Fractions Prescribed: 5
Plan Total Prescribed Dose: 10 Gy
Reference Point Dosage Given to Date: 54.4 Gy
Reference Point Session Dosage Given: 2 Gy
Session Number: 30

## 2021-11-14 ENCOUNTER — Ambulatory Visit
Admission: RE | Admit: 2021-11-14 | Discharge: 2021-11-14 | Disposition: A | Discharge status: Home / Self Care | Payer: Federal, State, Local not specified - PPO | Source: Ambulatory Visit | Attending: Radiation Oncology | Admitting: Radiation Oncology

## 2021-11-14 ENCOUNTER — Other Ambulatory Visit: Payer: Self-pay

## 2021-11-14 DIAGNOSIS — Z5112 Encounter for antineoplastic immunotherapy: Secondary | ICD-10-CM | POA: Diagnosis not present

## 2021-11-14 DIAGNOSIS — Z170 Estrogen receptor positive status [ER+]: Secondary | ICD-10-CM | POA: Diagnosis not present

## 2021-11-14 DIAGNOSIS — Z510 Encounter for antineoplastic radiation therapy: Secondary | ICD-10-CM | POA: Diagnosis not present

## 2021-11-14 DIAGNOSIS — C50412 Malignant neoplasm of upper-outer quadrant of left female breast: Secondary | ICD-10-CM | POA: Diagnosis not present

## 2021-11-14 LAB — RAD ONC ARIA SESSION SUMMARY
Course Elapsed Days: 43
Plan Fractions Treated to Date: 3
Plan Prescribed Dose Per Fraction: 2 Gy
Plan Total Fractions Prescribed: 5
Plan Total Prescribed Dose: 10 Gy
Reference Point Dosage Given to Date: 56.4 Gy
Reference Point Session Dosage Given: 2 Gy
Session Number: 31

## 2021-11-17 ENCOUNTER — Other Ambulatory Visit: Payer: Self-pay

## 2021-11-17 ENCOUNTER — Ambulatory Visit: Payer: Federal, State, Local not specified - PPO | Attending: General Surgery

## 2021-11-17 ENCOUNTER — Ambulatory Visit
Admission: RE | Admit: 2021-11-17 | Discharge: 2021-11-17 | Disposition: A | Discharge status: Home / Self Care | Payer: Federal, State, Local not specified - PPO | Source: Ambulatory Visit | Attending: Radiation Oncology | Admitting: Radiation Oncology

## 2021-11-17 VITALS — Wt 215.5 lb

## 2021-11-17 DIAGNOSIS — Z170 Estrogen receptor positive status [ER+]: Secondary | ICD-10-CM | POA: Diagnosis not present

## 2021-11-17 DIAGNOSIS — C50412 Malignant neoplasm of upper-outer quadrant of left female breast: Secondary | ICD-10-CM | POA: Diagnosis not present

## 2021-11-17 DIAGNOSIS — Z5112 Encounter for antineoplastic immunotherapy: Secondary | ICD-10-CM | POA: Diagnosis not present

## 2021-11-17 DIAGNOSIS — Z483 Aftercare following surgery for neoplasm: Secondary | ICD-10-CM | POA: Insufficient documentation

## 2021-11-17 DIAGNOSIS — Z510 Encounter for antineoplastic radiation therapy: Secondary | ICD-10-CM | POA: Diagnosis not present

## 2021-11-17 LAB — RAD ONC ARIA SESSION SUMMARY
Course Elapsed Days: 46
Plan Fractions Treated to Date: 4
Plan Prescribed Dose Per Fraction: 2 Gy
Plan Total Fractions Prescribed: 5
Plan Total Prescribed Dose: 10 Gy
Reference Point Dosage Given to Date: 58.4 Gy
Reference Point Session Dosage Given: 2 Gy
Session Number: 32

## 2021-11-17 NOTE — Therapy (Signed)
?  OUTPATIENT PHYSICAL THERAPY SOZO SCREENING NOTE ? ? ?Patient Name: Joanna Osborn ?MRN: [id number] ?DOB:03/30/1971, 51 y.o., female ?Today's Date: 11/17/2021 ? ?PCP: Lennie Odor, PA ?REFERRING PROVIDER: Rolm Bookbinder, MD ? ? PT End of Session - 11/17/21 1046   ? ? Visit Number 7   # unchanged due to screen only  ? PT Start Time 1043   ? PT Stop Time 1048   ? PT Time Calculation (min) 5 min   ? Activity Tolerance Patient tolerated treatment well   ? Behavior During Therapy [facility] for tasks assessed/performed   ? ?  ?  ? ?  ? ? ?Past Medical History:  ?Diagnosis Date  ? Anxiety   ? Breast cancer ([place])   ? Depression   ? Family history of colon cancer 03/19/2021  ? History of kidney stones   ? Hypertension   ? ?Past Surgical History:  ?Procedure Laterality Date  ? BREAST LUMPECTOMY WITH RADIOACTIVE SEED AND SENTINEL LYMPH NODE BIOPSY Left 08/25/2021  ? Procedure: LEFT BREAST LUMPECTOMY WITH RADIOACTIVE SEED AND AXILLARY SENTINEL LYMPH NODE BIOPSY;  Surgeon: Rolm Bookbinder, MD;  Location: [facility];  Service: General;  Laterality: Left;  ? CHOLECYSTECTOMY  2002  ? INSERTION OF MESH N/A 10/28/2016  ? Procedure: INSERTION OF MESH;  Surgeon: Clovis [name], MD;  Location: [facility];  Service: General;  Laterality: N/A;  ? PORTACATH PLACEMENT Right 04/02/2021  ? Procedure: INSERTION PORT-A-CATH;  Surgeon: Rolm Bookbinder, MD;  Location: [facility];  Service: General;  Laterality: Right;  ? [place]  ? VENTRAL HERNIA REPAIR N/A 10/28/2016  ? Procedure: LAPAROSCOPIC VENTRAL HERNIA REPAIR;  Surgeon: Clovis [name], MD;  Location: [facility];  Service: General;  Laterality: N/A;  ? WRIST SURGERY    ? ?Patient Active Problem List  ? Diagnosis Date Noted  ? Left breast abscess 09/12/2021  ? Essential hypertension 09/12/2021  ? Lesion of pancreas 09/12/2021  ? Hypokalemia 09/12/2021  ? Genetic testing 04/08/2021  ? Port-A-Cath in place 04/03/2021  ? Family history of colon cancer 03/19/2021   ? Malignant neoplasm of upper-outer quadrant of left breast in female, estrogen receptor positive ([place]) 03/13/2021  ? Suicide ideation 07/07/2013  ? 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 ? ?REFERRING DIAG: left breast cancer at risk for lymphedema ? ?THERAPY DIAG:  ?Aftercare following surgery for neoplasm ? ?PERTINENT HISTORY: Patient was diagnosed on 03/05/2021 with left grade III invasive ductal carcinoma breast cancer. She underwent neoadjuvant chemotherapy from 04/03/2021-07/24/2021. She had a left lumpectomy and sentinel node biopsy (2 negative nodes) on 08/25/2021. It is triple positive with a Ki67 of 40%.  ? ?PRECAUTIONS: left UE Lymphedema risk, None ? ?SUBJECTIVE: Pt returns for her 3 month L-Dex screen.  ? ?PAIN:  ?Are you having pain? No ? ?SOZO SCREENING: ?Patient was assessed today using the SOZO machine to determine the lymphedema index score. This was compared to her baseline score. It was determined that she is within the recommended range when compared to her baseline and no further action is needed at this time. She will continue SOZO screenings. These are done every 3 months for 2 years post operatively followed by every 6 months for 2 years, and then annually. ? ? ? ?Otelia Limes, PTA ?11/17/2021, 10:50 AM ? ?  ? ?

## 2021-11-18 ENCOUNTER — Other Ambulatory Visit: Payer: Self-pay

## 2021-11-18 ENCOUNTER — Encounter: Payer: Self-pay | Admitting: Radiation Oncology

## 2021-11-18 ENCOUNTER — Ambulatory Visit
Admission: RE | Admit: 2021-11-18 | Discharge: 2021-11-18 | Disposition: A | Discharge status: Home / Self Care | Payer: Federal, State, Local not specified - PPO | Source: Ambulatory Visit | Attending: Radiation Oncology | Admitting: Radiation Oncology

## 2021-11-18 DIAGNOSIS — Z510 Encounter for antineoplastic radiation therapy: Secondary | ICD-10-CM | POA: Diagnosis not present

## 2021-11-18 DIAGNOSIS — Z5112 Encounter for antineoplastic immunotherapy: Secondary | ICD-10-CM | POA: Diagnosis not present

## 2021-11-18 DIAGNOSIS — Z170 Estrogen receptor positive status [ER+]: Secondary | ICD-10-CM | POA: Diagnosis not present

## 2021-11-18 DIAGNOSIS — C50412 Malignant neoplasm of upper-outer quadrant of left female breast: Secondary | ICD-10-CM | POA: Diagnosis not present

## 2021-11-18 LAB — RAD ONC ARIA SESSION SUMMARY
Course Elapsed Days: 47
Plan Fractions Treated to Date: 5
Plan Prescribed Dose Per Fraction: 2 Gy
Plan Total Fractions Prescribed: 5
Plan Total Prescribed Dose: 10 Gy
Reference Point Dosage Given to Date: 60.4 Gy
Reference Point Session Dosage Given: 2 Gy
Session Number: 33

## 2021-11-24 DIAGNOSIS — K869 Disease of pancreas, unspecified: Secondary | ICD-10-CM

## 2021-11-24 HISTORY — DX: Disease of pancreas, unspecified: K86.9

## 2021-11-25 ENCOUNTER — Other Ambulatory Visit: Payer: Self-pay | Admitting: Hematology

## 2021-11-25 ENCOUNTER — Ambulatory Visit (HOSPITAL_COMMUNITY)
Admission: RE | Admit: 2021-11-25 | Discharge: 2021-11-25 | Disposition: A | Discharge status: Home / Self Care | Payer: Federal, State, Local not specified - PPO | Source: Ambulatory Visit | Attending: Hematology | Admitting: Hematology

## 2021-11-25 DIAGNOSIS — C50412 Malignant neoplasm of upper-outer quadrant of left female breast: Secondary | ICD-10-CM | POA: Insufficient documentation

## 2021-11-25 DIAGNOSIS — Z170 Estrogen receptor positive status [ER+]: Secondary | ICD-10-CM | POA: Insufficient documentation

## 2021-11-25 DIAGNOSIS — R935 Abnormal findings on diagnostic imaging of other abdominal regions, including retroperitoneum: Secondary | ICD-10-CM | POA: Diagnosis not present

## 2021-11-25 DIAGNOSIS — K862 Cyst of pancreas: Secondary | ICD-10-CM | POA: Diagnosis not present

## 2021-11-25 DIAGNOSIS — R160 Hepatomegaly, not elsewhere classified: Secondary | ICD-10-CM | POA: Diagnosis not present

## 2021-11-25 DIAGNOSIS — K760 Fatty (change of) liver, not elsewhere classified: Secondary | ICD-10-CM | POA: Diagnosis not present

## 2021-11-25 MED ORDER — GADOBUTROL 1 MMOL/ML IV SOLN
9.0000 mL | Freq: Once | INTRAVENOUS | Status: AC | PRN
  Administered 2021-11-25: 9 mL via INTRAVENOUS

## 2021-11-28 ENCOUNTER — Other Ambulatory Visit: Payer: Self-pay

## 2021-11-28 ENCOUNTER — Inpatient Hospital Stay: Payer: Federal, State, Local not specified - PPO | Attending: Hematology

## 2021-11-28 VITALS — BP 102/85 | HR 95 | Temp 98.5°F | Resp 17 | Wt 215.0 lb

## 2021-11-28 DIAGNOSIS — Z5112 Encounter for antineoplastic immunotherapy: Secondary | ICD-10-CM | POA: Insufficient documentation

## 2021-11-28 DIAGNOSIS — Z170 Estrogen receptor positive status [ER+]: Secondary | ICD-10-CM | POA: Insufficient documentation

## 2021-11-28 DIAGNOSIS — C50412 Malignant neoplasm of upper-outer quadrant of left female breast: Secondary | ICD-10-CM | POA: Insufficient documentation

## 2021-11-28 DIAGNOSIS — Z5111 Encounter for antineoplastic chemotherapy: Secondary | ICD-10-CM | POA: Insufficient documentation

## 2021-11-28 DIAGNOSIS — Z95828 Presence of other vascular implants and grafts: Secondary | ICD-10-CM

## 2021-11-28 MED ORDER — ACETAMINOPHEN 325 MG PO TABS
650.0000 mg | ORAL_TABLET | Freq: Once | ORAL | Status: AC
  Administered 2021-11-28: 650 mg via ORAL
  Filled 2021-11-28: qty 2

## 2021-11-28 MED ORDER — SODIUM CHLORIDE 0.9 % IV SOLN: Freq: Once | INTRAVENOUS | Status: AC

## 2021-11-28 MED ORDER — TRASTUZUMAB-DKST CHEMO 150 MG IV SOLR
6.0000 mg/kg | Freq: Once | INTRAVENOUS | Status: AC
  Administered 2021-11-28: 567 mg via INTRAVENOUS
  Filled 2021-11-28: qty 27

## 2021-11-28 MED ORDER — HEPARIN SOD (PORK) LOCK FLUSH 100 UNIT/ML IV SOLN
500.0000 [IU] | Freq: Once | INTRAVENOUS | Status: AC
  Administered 2021-11-28: 500 [IU]

## 2021-11-28 MED ORDER — SODIUM CHLORIDE 0.9% FLUSH
10.0000 mL | Freq: Once | INTRAVENOUS | Status: AC
  Administered 2021-11-28: 10 mL

## 2021-11-28 MED ORDER — DIPHENHYDRAMINE HCL 25 MG PO CAPS
50.0000 mg | ORAL_CAPSULE | Freq: Once | ORAL | Status: AC
  Administered 2021-11-28: 50 mg via ORAL
  Filled 2021-11-28: qty 2

## 2021-11-28 NOTE — Patient Instructions (Signed)
[facility]  Discharge Instructions: ?Thank you for choosing [facility] to provide your oncology and hematology care.  ? ?If you have a lab appointment with the [facility], please go directly to the [facility] and check in at the registration area. ?  ?Wear comfortable clothing and clothing appropriate for easy access to any Portacath or PICC line.  ? ?We strive to give you quality time with your provider. You may need to reschedule your appointment if you arrive late (15 or more minutes).  Arriving late affects you and other patients whose appointments are after yours.  Also, if you miss three or more appointments without notifying the office, you may be dismissed from the clinic at the provider?s discretion.    ?  ?For prescription refill requests, have your pharmacy contact our office and allow 72 hours for refills to be completed.   ? ?Today you received the following chemotherapy and/or immunotherapy agents: [place]ri    ?  ?To help prevent nausea and vomiting after your treatment, we encourage you to take your nausea medication as directed. ? ?BELOW ARE SYMPTOMS THAT SHOULD BE REPORTED IMMEDIATELY: ?*FEVER GREATER THAN 100.4 F (38 ?C) OR HIGHER ?*CHILLS OR SWEATING ?*NAUSEA AND VOMITING THAT IS NOT CONTROLLED WITH YOUR NAUSEA MEDICATION ?*UNUSUAL SHORTNESS OF BREATH ?*UNUSUAL BRUISING OR BLEEDING ?*URINARY PROBLEMS (pain or burning when urinating, or frequent urination) ?*BOWEL PROBLEMS (unusual diarrhea, constipation, pain near the anus) ?TENDERNESS IN MOUTH AND THROAT WITH OR WITHOUT PRESENCE OF ULCERS (sore throat, sores in mouth, or a toothache) ?UNUSUAL RASH, SWELLING OR PAIN  ?UNUSUAL VAGINAL DISCHARGE OR ITCHING  ? ?Items with * indicate a potential emergency and should be followed up as soon as possible or go to the Emergency Department if any problems should occur. ? ?Please show the CHEMOTHERAPY ALERT CARD or IMMUNOTHERAPY ALERT CARD at check-in to the  Emergency Department and triage nurse. ? ?Should you have questions after your visit or need to cancel or reschedule your appointment, please contact [facility]  Dept: [phone]  and follow the prompts.  Office hours are 8:00 a.m. to 4:30 p.m. Monday - Friday. Please note that voicemails left after 4:00 p.m. may not be returned until the following business day.  We are closed weekends and major holidays. You have access to a nurse at all times for urgent questions. Please call the main number to the clinic Dept: [phone] and follow the prompts. ? ? ?For any non-urgent questions, you may also contact your provider using MyChart. We now offer e-Visits for anyone 19 and older to request care online for non-urgent symptoms. For details visit [email or web address]. ?  ?Also download the MyChart app! Go to the app store, search "MyChart", open the app, select [facility], and log in with your MyChart username and password. ? ?Due to Covid, a mask is required upon entering the hospital/clinic. If you do not have a mask, one will be given to you upon arrival. For doctor visits, patients may have 1 support person aged 24 or older with them. For treatment visits, patients cannot have anyone with them due to current Covid guidelines and our immunocompromised population.  ? ?Goserelin injection ?What is this medication? ?GOSERELIN (GOE se rel in) is similar to a hormone found in the body. It lowers the amount of sex hormones that the body makes. Men will have lower testosterone levels and women will have lower estrogen levels while taking this medicine. In  men, this medicine is used to treat prostate cancer; the injection is either given once per month or once every 12 weeks. A once per month injection (only) is used to treat women with endometriosis, dysfunctional uterine bleeding, or advanced breast cancer. ?This medicine may be used for other purposes; ask your health care provider or  pharmacist if you have questions. ?COMMON BRAND NAME(S): Zoladex, Zoladex 43-Month ?What should I tell my care team before I take this medication? ?They need to know if you have any of these conditions: ?bone problems ?diabetes ?heart disease ?history of irregular heartbeat ?an unusual or allergic reaction to goserelin, other medicines, foods, dyes, or preservatives ?pregnant or trying to get pregnant ?breast-feeding ?How should I use this medication? ?This medicine is for injection under the skin. It is given by a health care professional in a hospital or clinic setting. ?Talk to your pediatrician regarding the use of this medicine in children. Special care may be needed. ?Overdosage: If you think you have taken too much of this medicine contact a poison control center or emergency room at once. ?NOTE: This medicine is only for you. Do not share this medicine with others. ?What if I miss a dose? ?It is important not to miss your dose. Call your doctor or health care professional if you are unable to keep an appointment. ?What may interact with this medication? ?Do not take this medicine with any of the following medications: ?cisapride ?dronedarone ?pimozide ?thioridazine ?This medicine may also interact with the following medications: ?other medicines that prolong the QT interval (an abnormal heart rhythm) ?This list may not describe all possible interactions. Give your health care provider a list of all the medicines, herbs, non-prescription drugs, or dietary supplements you use. Also tell them if you smoke, drink alcohol, or use illegal drugs. Some items may interact with your medicine. ?What should I watch for while using this medication? ?Visit your doctor or health care provider for regular checks on your progress. Your symptoms may appear to get worse during the first weeks of this therapy. Tell your doctor or healthcare provider if your symptoms do not start to get better or if they get worse after this  time. ?Your bones may get weaker if you take this medicine for a long time. If you smoke or frequently drink alcohol you may increase your risk of bone loss. A family history of osteoporosis, chronic use of drugs for seizures (convulsions), or corticosteroids can also increase your risk of bone loss. Talk to your doctor about how to keep your bones strong. ?This medicine should stop regular monthly menstruation in women. Tell your doctor if you continue to menstruate. ?Women should not become pregnant while taking this medicine or for 12 weeks after stopping this medicine. Women should inform their doctor if they wish to become pregnant or think they might be pregnant. There is a potential for serious side effects to an unborn child. Talk to your health care professional or pharmacist for more information. Do not breast-feed an infant while taking this medicine. ?Men should inform their doctors if they wish to father a child. This medicine may lower sperm counts. Talk to your health care professional or pharmacist for more information. ?This medicine may increase blood sugar. Ask your healthcare provider if changes in diet or medicines are needed if you have diabetes. ?What side effects may I notice from receiving this medication? ?Side effects that you should report to your doctor or health care professional as soon as possible: ?  allergic reactions like skin rash, itching or hives, swelling of the face, lips, or tongue ?bone pain ?breathing problems ?changes in vision ?chest pain ?feeling faint or lightheaded, falls ?fever, chills ?pain, swelling, warmth in the leg ?pain, tingling, numbness in the hands or feet ?signs and symptoms of high blood sugar such as being more thirsty or hungry or having to urinate more than normal. You may also feel very tired or have blurry vision ?signs and symptoms of low blood pressure like dizziness; feeling faint or lightheaded, falls; unusually weak or tired ?stomach pain ?swelling  of the ankles, feet, hands ?trouble passing urine or change in the amount of urine ?unusually high or low blood pressure ?unusually weak or tired ?Side effects that usually do not require medical attent

## 2021-12-02 ENCOUNTER — Encounter: Payer: Self-pay | Admitting: Hematology

## 2021-12-02 NOTE — Progress Notes (Signed)
? ?                                                                                                                                                          ?  Patient Name: Joanna Osborn ?MRN: [id number] ?DOB: March 08, 1971 ?Referring Physician: Nicholas Lose (Profile Not Attached) ?Date of Service: 11/18/2021 ?[facility], [place] ? ?                                                      End Of Treatment Note ? ?Diagnoses: C50.412-Malignant neoplasm of upper-outer quadrant of left female breast ? 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 ? ?Intent: Curative ? ?Radiation Treatment Dates: 10/02/2021 through 11/18/2021 ?Site Technique Total Dose (Gy) Dose per Fx (Gy) Completed Fx Beam Energies  ?Breast, Left: Breast_L 3D 50.4/50.4 1.8 28/28 10X  ?Breast, Left: Breast_L_Bst 3D 10/10 2 5/5 6X, 10X  ? ?Narrative: The patient tolerated radiation therapy relatively well. She developed fatigue and anticipated skin changes in the treatment field.  ? ?Plan: The patient will receive a call in about one month from the radiation oncology department. She will continue follow up with Dr. Lindi Adie as well.  ?________________________________________________ ? ? ? ?Carola Rhine, PAC  ?

## 2021-12-05 ENCOUNTER — Inpatient Hospital Stay (HOSPITAL_BASED_OUTPATIENT_CLINIC_OR_DEPARTMENT_OTHER): Payer: Federal, State, Local not specified - PPO | Admitting: Hematology

## 2021-12-05 ENCOUNTER — Inpatient Hospital Stay: Payer: Federal, State, Local not specified - PPO

## 2021-12-05 ENCOUNTER — Other Ambulatory Visit: Payer: Self-pay

## 2021-12-05 ENCOUNTER — Encounter: Payer: Self-pay | Admitting: Hematology

## 2021-12-05 VITALS — BP 142/95 | HR 79 | Temp 98.5°F | Resp 17 | Ht 65.0 in | Wt 216.1 lb

## 2021-12-05 DIAGNOSIS — Z170 Estrogen receptor positive status [ER+]: Secondary | ICD-10-CM | POA: Diagnosis not present

## 2021-12-05 DIAGNOSIS — Z5112 Encounter for antineoplastic immunotherapy: Secondary | ICD-10-CM | POA: Diagnosis not present

## 2021-12-05 DIAGNOSIS — Z5111 Encounter for antineoplastic chemotherapy: Secondary | ICD-10-CM | POA: Diagnosis not present

## 2021-12-05 DIAGNOSIS — Z95828 Presence of other vascular implants and grafts: Secondary | ICD-10-CM

## 2021-12-05 DIAGNOSIS — C50412 Malignant neoplasm of upper-outer quadrant of left female breast: Secondary | ICD-10-CM

## 2021-12-05 MED ORDER — HYDROCHLOROTHIAZIDE 25 MG PO TABS: 25.0000 mg | ORAL_TABLET | Freq: Every day | ORAL | 0 refills | Status: AC

## 2021-12-05 MED ORDER — GOSERELIN ACETATE 3.6 MG ~~LOC~~ IMPL
3.6000 mg | DRUG_IMPLANT | Freq: Once | SUBCUTANEOUS | Status: AC
  Administered 2021-12-05: 3.6 mg via SUBCUTANEOUS
  Filled 2021-12-05: qty 3.6

## 2021-12-05 MED ORDER — LETROZOLE 2.5 MG PO TABS: 2.5000 mg | ORAL_TABLET | Freq: Every day | ORAL | 3 refills | Status: DC

## 2021-12-05 NOTE — Progress Notes (Signed)
?[facility]   ?[phone] [phone]   ?Clinic Follow up Note  ? ?Patient Care Team: ?Lennie Odor, PA as PCP - General (Physician Assistant) ?Rockwell Germany, RN as Oncology Nurse Navigator ?Mauro Kaufmann, RN as Oncology Nurse Navigator ?Rolm Bookbinder, MD as Consulting Physician (General Surgery) ?Truitt Merle, MD as Consulting Physician (Hematology) ?Kyung Rudd, MD as Consulting Physician (Radiation Oncology) ?Truitt Merle, MD as Consulting Physician (Hematology) ? ?Date of Service:  12/05/2021 ? ?CHIEF COMPLAINT: f/u of left breast cancer ? ?CURRENT THERAPY:  ?-Maintenance herceptin, starting 08/14/21 ?-Zoladex, q4weeks, started 11/07/21, until BSO ?-Letrozole, to start 11/2021 ? ?ASSESSMENT & PLAN:  ?Joanna Osborn is a 51 y.o. peri-menopausal female with  ? ?1. 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 Left mammogram on 03/05/21 showed 2.1 cm mass at 12 o'clock. Biopsy confirmed invasive ductal carcinoma, grade 3, weakly ER+ and Her2+ ?-Breast MRI 03/26/21 showed additional areas of concern-- a left breast/axilla lymph node, and a right breast mass. Right breast biopsy 04/23/21 showed complex sclerosing lesion. ?-echo from 04/02/21 reviewed, no concerns. Repeat 09/03/21 stable. ?-She received 6 cycles of neoadjuvant TCHP with G-CSF 04/03/21 - 07/24/21. Docetaxel dose reduced from C4 due to neuropathy. ?-she moved to maintenance HP on 08/14/21.  ?-she underwent lumpectomy on 08/25/21 with Dr. Donne Hazel. She had a complete response, no residual disease seen.  ?-given her good response, she continues maintenance herceptin alone.  ?-she completed adjuvant radiation 10/02/20 - 11/18/21 under Dr. Lisbeth Renshaw. ?-she was started on Zoladex on 11/07/21. She reports she is scheduled to see her GYN next week to discuss BSO. ?-I reviewed adjuvant letrozole again today.  Benefit, side effects and  management discussed with her, she agrees to start.  I called in, and she will start in the next few weeks. ?  ?2. Pancreas lesion ?-noted on CT AP on 09/12/21 for work up of diarrhea and abdominal pain. ?-abdomen MRI on 11/25/21 showed a 2 cm complex cystic mass, suggesting cystic neoplasm. Will review in GI conference to determine next step  ?  ?3. Mild Anemia ?-her hgb has dropped recently secondary to chemo  ?-She began oral iron ?-she received blood transfusion on 07/18/21. ?-improving ?  ?4. Chemo induced peripheral neuropathy G1 ?-mild in her toes, from docetaxel ?-I previously recommended a B complex once daily ?  ?5. Anxiety and depression, HTN ?-continue HCTZ and f/u with PCP ?  ?  ?PLAN: ?-Zoladex injection today and every 4 weeks ?-start letrozole in next few weeks, I called in today  ?-continue herceptin every 3 weeks, next 5/26 ?-f/u in 9 weeks ?-We will review her recent abdominal MRI in GI conference ? ? ?No problem-specific Assessment & Plan notes found for this encounter. ? ? ?SUMMARY OF ONCOLOGIC HISTORY: ?Oncology History Overview Note  ? Cancer Staging  ?Malignant neoplasm of upper-outer quadrant of left breast in female, estrogen receptor positive ([place]) ?Staging form: Breast, AJCC 8th Edition ?- Clinical stage from 03/07/2021: Stage IB (cT2, cN0, cM0, G3, ER+, PR+, HER2+) - Signed by Truitt Merle, MD on 03/17/2021 ?Stage prefix: Initial diagnosis ?Histologic grading system: 3 grade system ?- Pathologic stage from 08/25/2021: No Stage Recommended (ypT0, pN0, cM0, G3, ER+, PR+, HER2+) - Signed by Truitt Merle, MD on 09/05/2021 ?Stage prefix: Post-therapy ?Response to neoadjuvant therapy: Complete response ?Nuclear grade: G3 ?Histologic grading system: 3 grade system ? ? ?  ?Malignant neoplasm of  upper-outer quadrant of left breast in female, estrogen receptor positive ([place])  ?03/05/2021 Mammogram  ? Diagnostic Left Mammogram; Left Breast Ultrasound ? ?IMPRESSION: ?At the palpable site of concern in the left  breast at 12 o'clock ?there is a suspicious mass measuring 2.1 cm. ?  ?03/07/2021 Cancer Staging  ? Staging form: Breast, AJCC 8th Edition ?- Clinical stage from 03/07/2021: Stage IB (cT2, cN0, cM0, G3, ER+, PR+, HER2+) - Signed by Truitt Merle, MD on 03/17/2021 ?Stage prefix: Initial diagnosis ?Histologic grading system: 3 grade system ? ?  ?03/07/2021 Pathology Results  ? Diagnosis ?Breast, left, needle core biopsy, left breast 12 o' clock ?- INVASIVE DUCTAL CARCINOMA ?- SEE COMMENT ?Based on the biopsy, the carcinoma appears Nottingham grade 3 of 3 ? ?PROGNOSTIC INDICATORS ?Results: ?IMMUNOHISTOCHEMICAL AND MORPHOMETRIC ANALYSIS PERFORMED MANUALLY ?The tumor cells are POSITIVE for Her2 (3+). ?Estrogen Receptor: 30%, POSITIVE, WEAK STAINING INTENSITY ?Progesterone Receptor: 15%, POSITIVE, MODERATE STAINING INTENSITY ?Proliferation Marker Ki67: 40% ? ? ?  ?03/13/2021 Initial Diagnosis  ? Malignant neoplasm of upper-outer quadrant of left breast in female, estrogen receptor positive ([place]) ? ?  ?03/26/2021 Imaging  ? MRI Breast ? ?IMPRESSION: ?1. 2.5 cm biopsy-proven malignancy within the UPPER LEFT breast. ?2. Abnormal lymph node with focal cortical thickening in the ?posterior UPPER-OUTER LEFT breast/LOWER LEFT axilla. 2nd-look ultrasound with possible biopsy is recommended. ?3. Indeterminate 0.8 cm OUTER RIGHT breast mass. 2nd-look ultrasound with possible biopsy is recommended. ?  ?03/29/2021 Genetic Testing  ? Negative hereditary cancer genetic testing: no pathogenic variants detected in [facility] CustomNext-Cancer +RNAinsight Panel.  The report date is March 29, 2021.  ? ?The CustomNext-Cancer+RNAinsight panel offered by [facility] includes sequencing and rearrangement analysis for the following 47 genes:  APC, ATM, AXIN2, BARD1, BMPR1A, BRCA1, BRCA2, BRIP1, CDH1, CDK4, CDKN2A, CHEK2, DICER1, EPCAM, GREM1, HOXB13, MEN1, MLH1, MSH2, MSH3, MSH6, MUTYH, NBN, NF1, NF2, NTHL1, PALB2, PMS2, POLD1, POLE, PTEN, RAD51C, RAD51D,  RECQL, RET, SDHA, SDHAF2, SDHB, SDHC, SDHD, SMAD4, SMARCA4, STK11, TP53, TSC1, TSC2, and VHL.  RNA data is routinely analyzed for use in variant interpretation for all genes. ?  ?04/04/2021 - 07/26/2021 Chemotherapy  ? Patient is on Treatment Plan : BREAST  Docetaxel + Carboplatin + Trastuzumab + Pertuzumab  (TCHP) q21d   ? ?   ?04/14/2021 Imaging  ? [place] Bilateral Breast ? ?IMPRESSION: ?1. No sonographic correlate for the 8 mm enhancing mass in the outer right breast. Recommendation is to prior seed with MRI guided biopsy. ?2. An undulating, low lying lymph node along the 1 o'clock axis of ?the left breast demonstrates no areas of cortical thickening beyond ?3 mm. ?  ?08/14/2021 -  Chemotherapy  ? Patient is on Treatment Plan : BREAST Trastuzumab + Pertuzumab q21d  ? ?   ?08/25/2021 Cancer Staging  ? Staging form: Breast, AJCC 8th Edition ?- Pathologic stage from 08/25/2021: No Stage Recommended (ypT0, pN0, cM0, G3, ER+, PR+, HER2+) - Signed by Truitt Merle, MD on 09/05/2021 ?Stage prefix: Post-therapy ?Response to neoadjuvant therapy: Complete response ?Nuclear grade: G3 ?Histologic grading system: 3 grade system ? ?  ?08/25/2021 Definitive Surgery  ? FINAL MICROSCOPIC DIAGNOSIS:  ? ?A. BREAST, LEFT, LUMPECTOMY:  ?- No evidence of residual carcinoma - complete therapeutic response  ?- Therapy related changes, including hyalin fibrosis  ?- Fibrocystic change with urinary  ?- See comment  ? ?B. BREAST, LEFT ADDITIONAL MEDIAL MARGIN, EXCISION:  ?- Fibrocystic change  ?- Negative for carcinoma  ? ?C. BREAST, LEFT ADDITIONAL SUPERIOR MARGIN, EXCISION:  ?-  Fibrocystic change  ?- Negative for carcinoma  ? ?D. BREAST, LEFT ADDITIONAL POSTERIOR MARGIN, EXCISION:  ?- Benign breast parenchyma  ?- Negative for carcinoma  ? ?E. BREAST, LEFT ADDITIONAL ANTERIOR MARGIN, EXCISION:  ?- Benign breast parenchyma  ?- Negative for carcinoma  ? ?F. LYMPH NODE, LEFT AXILLARY, SENTINEL, EXCISION:  ?- Lymph node, negative for carcinoma (0/1)  ? ?G.  LYMPH NODE, LEFT AXILLARY, SENTINEL, EXCISION:  ?- Lymph node, negative for carcinoma (0/1)  ? ? ?COMMENT:  ?The appropriate pathologic stage (AJCC eighth edition) is ypT0 ypN0.  ?  ? ? ? ?INTERVAL HISTORY

## 2021-12-08 DIAGNOSIS — Z853 Personal history of malignant neoplasm of breast: Secondary | ICD-10-CM | POA: Diagnosis not present

## 2021-12-08 DIAGNOSIS — Z170 Estrogen receptor positive status [ER+]: Secondary | ICD-10-CM | POA: Diagnosis not present

## 2021-12-08 DIAGNOSIS — N760 Acute vaginitis: Secondary | ICD-10-CM | POA: Diagnosis not present

## 2021-12-08 DIAGNOSIS — Z113 Encounter for screening for infections with a predominantly sexual mode of transmission: Secondary | ICD-10-CM | POA: Diagnosis not present

## 2021-12-10 ENCOUNTER — Other Ambulatory Visit: Payer: Self-pay

## 2021-12-10 ENCOUNTER — Telehealth: Payer: Self-pay | Admitting: Hematology

## 2021-12-10 NOTE — Telephone Encounter (Signed)
Scheduled follow-up appointments per 5/12 los. Patient is aware. 

## 2021-12-10 NOTE — Progress Notes (Signed)
The proposed treatment discussed in conference is for discussion purpose only and is not a binding recommendation.  The patients have not been physically examined, or presented with their treatment options.  Therefore, final treatment plans cannot be decided.  

## 2021-12-12 ENCOUNTER — Telehealth: Payer: Self-pay | Admitting: Hematology

## 2021-12-12 ENCOUNTER — Other Ambulatory Visit: Payer: Self-pay | Admitting: Hematology

## 2021-12-12 ENCOUNTER — Telehealth: Payer: Self-pay

## 2021-12-12 DIAGNOSIS — C50412 Malignant neoplasm of upper-outer quadrant of left female breast: Secondary | ICD-10-CM

## 2021-12-12 NOTE — Telephone Encounter (Signed)
See alternate phone note dated 5/19

## 2021-12-12 NOTE — Telephone Encounter (Signed)
We reviewed her case in GI tumor conference this week.  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  I called the patient, she is agreeable.  I will refer her back to [facility] for EUS.  Referral placed.  Joanna Osborn  12/12/2021

## 2021-12-12 NOTE — Telephone Encounter (Signed)
-----   Message from Truitt Merle, MD sent at 12/12/2021  9:11 AM EDT ----- Chong Sicilian,  Could you schedule this lady to see Dr. Ardis Hughs to discuss EUS biopsy of her pancreatic cystic lesion? Non-urgent. We reviewed in GI conference this week.  Thanks   Krista Blue

## 2021-12-12 NOTE — Telephone Encounter (Signed)
-----   Message from Milus Banister, MD sent at 12/12/2021 16:38 AM EDT ----- Certainly, we'll contact her about it.  Basilio Meadow, She needs upper EUS, first available with myself or Gabe for incidental pancreatic cyst.  Thanks  Wynetta Fines   ----- Message ----- From: Truitt Merle, MD Sent: 12/12/2021   9:13 AM EDT To: Milus Banister, MD, Timothy Lasso, RN, #  Ledford Goodson,  Could you schedule this lady to see Dr. Ardis Hughs to discuss EUS biopsy of her pancreatic cystic lesion? Non-urgent. We reviewed in GI conference this week.  Thanks   Krista Blue

## 2021-12-14 ENCOUNTER — Encounter: Payer: Self-pay | Admitting: Hematology

## 2021-12-15 ENCOUNTER — Other Ambulatory Visit: Payer: Self-pay

## 2021-12-15 DIAGNOSIS — K869 Disease of pancreas, unspecified: Secondary | ICD-10-CM

## 2021-12-15 NOTE — Telephone Encounter (Signed)
EUS scheduled, pt instructed and medications reviewed.  Patient instructions mailed to home and sent to My Chart .  Patient to call with any questions or concerns.  

## 2021-12-15 NOTE — Telephone Encounter (Signed)
EUS has been scheduled for 02/19/22 at [facility] at 10 am with GM.    Left message on machine to call back

## 2021-12-16 ENCOUNTER — Ambulatory Visit: Payer: Federal, State, Local not specified - PPO

## 2021-12-19 ENCOUNTER — Other Ambulatory Visit: Payer: Self-pay

## 2021-12-19 ENCOUNTER — Encounter: Payer: Self-pay | Admitting: *Deleted

## 2021-12-19 ENCOUNTER — Inpatient Hospital Stay: Payer: Federal, State, Local not specified - PPO

## 2021-12-19 VITALS — BP 121/82 | HR 95 | Temp 98.9°F | Resp 18 | Wt 217.6 lb

## 2021-12-19 DIAGNOSIS — Z5111 Encounter for antineoplastic chemotherapy: Secondary | ICD-10-CM | POA: Diagnosis not present

## 2021-12-19 DIAGNOSIS — Z170 Estrogen receptor positive status [ER+]: Secondary | ICD-10-CM

## 2021-12-19 DIAGNOSIS — Z5112 Encounter for antineoplastic immunotherapy: Secondary | ICD-10-CM | POA: Diagnosis not present

## 2021-12-19 DIAGNOSIS — C50412 Malignant neoplasm of upper-outer quadrant of left female breast: Secondary | ICD-10-CM | POA: Diagnosis not present

## 2021-12-19 MED ORDER — DIPHENHYDRAMINE HCL 25 MG PO CAPS
50.0000 mg | ORAL_CAPSULE | Freq: Once | ORAL | Status: AC
  Administered 2021-12-19: 50 mg via ORAL
  Filled 2021-12-19: qty 2

## 2021-12-19 MED ORDER — ACETAMINOPHEN 325 MG PO TABS
650.0000 mg | ORAL_TABLET | Freq: Once | ORAL | Status: AC
  Administered 2021-12-19: 650 mg via ORAL
  Filled 2021-12-19: qty 2

## 2021-12-19 MED ORDER — TRASTUZUMAB-DKST CHEMO 150 MG IV SOLR
6.0000 mg/kg | Freq: Once | INTRAVENOUS | Status: AC
  Administered 2021-12-19: 567 mg via INTRAVENOUS
  Filled 2021-12-19: qty 27

## 2021-12-19 MED ORDER — SODIUM CHLORIDE 0.9 % IV SOLN: Freq: Once | INTRAVENOUS | Status: AC

## 2021-12-19 NOTE — Patient Instructions (Signed)
[facility] MEDICAL [facility]COLOGY  Discharge Instructions: Thank you for choosing [facility] to provide your oncology and hematology care.   If you have a lab appointment with the Cancer Center, please go directly to the Cancer Center and check in at the registration area.   Wear comfortable clothing and clothing appropriate for easy access to any Portacath or PICC line.   We strive to give you quality time with your provider. You may need to reschedule your appointment if you arrive late (15 or more minutes).  Arriving late affects you and other patients whose appointments are after yours.  Also, if you miss three or more appointments without notifying the office, you may be dismissed from the clinic at the provider's discretion.      For prescription refill requests, have your pharmacy contact our office and allow 72 hours for refills to be completed.    Today you received the following chemotherapy and/or immunotherapy agents herceptin      To help prevent nausea and vomiting after your treatment, we encourage you to take your nausea medication as directed.  BELOW ARE SYMPTOMS THAT SHOULD BE REPORTED IMMEDIATELY: *FEVER GREATER THAN 100.4 F (38 C) OR HIGHER *CHILLS OR SWEATING *NAUSEA AND VOMITING THAT IS NOT CONTROLLED WITH YOUR NAUSEA MEDICATION *UNUSUAL SHORTNESS OF BREATH *UNUSUAL BRUISING OR BLEEDING *URINARY PROBLEMS (pain or burning when urinating, or frequent urination) *BOWEL PROBLEMS (unusual diarrhea, constipation, pain near the anus) TENDERNESS IN MOUTH AND THROAT WITH OR WITHOUT PRESENCE OF ULCERS (sore throat, sores in mouth, or a toothache) UNUSUAL RASH, SWELLING OR PAIN  UNUSUAL VAGINAL DISCHARGE OR ITCHING   Items with * indicate a potential emergency and should be followed up as soon as possible or go to the Emergency Department if any problems should occur.  Please show the CHEMOTHERAPY ALERT CARD or IMMUNOTHERAPY ALERT CARD at check-in to  the Emergency Department and triage nurse.  Should you have questions after your visit or need to cancel or reschedule your appointment, please contact [facility]ICAL [facility]COLOG[facility]  Dept: [phone]  and follow the prompts.  Office hours are 8:00 a.m. to 4:30 p.m. Monday - Friday. Please note that voicemails left after 4:00 p.m. may not be returned until the following business day.  We are closed weekends and major holidays. You have access to a nurse at all times for urgent questions. Please call the main number to the clinic Dept: [phone] and follow the prompts.   For any non-urgent questions, you may also contact your provider using MyChart. We now offer e-Visits for anyone 18 and older to request care online for non-urgent symptoms. For details visit [email or web address].   Also download the MyChart app! Go to the app store, search "MyChart", open the app, select [facility], and log in with your MyChart username and password.  Due to Covid, a mask is required upon entering the hospital/clinic. If you do not have a mask, one will be given to you upon arrival. For doctor visits, patients may have 1 support person aged 18 or older with them. For treatment visits, patients cannot have anyone with them due to current Covid guidelines and our immunocompromised population.   

## 2022-01-01 ENCOUNTER — Other Ambulatory Visit: Payer: Self-pay

## 2022-01-02 ENCOUNTER — Inpatient Hospital Stay: Payer: Federal, State, Local not specified - PPO | Attending: Hematology

## 2022-01-02 ENCOUNTER — Other Ambulatory Visit: Payer: Self-pay

## 2022-01-02 VITALS — BP 128/86 | HR 96 | Temp 99.0°F | Resp 18

## 2022-01-02 DIAGNOSIS — Z95828 Presence of other vascular implants and grafts: Secondary | ICD-10-CM

## 2022-01-02 DIAGNOSIS — C50412 Malignant neoplasm of upper-outer quadrant of left female breast: Secondary | ICD-10-CM | POA: Insufficient documentation

## 2022-01-02 DIAGNOSIS — Z170 Estrogen receptor positive status [ER+]: Secondary | ICD-10-CM | POA: Diagnosis not present

## 2022-01-02 DIAGNOSIS — Z5111 Encounter for antineoplastic chemotherapy: Secondary | ICD-10-CM | POA: Diagnosis not present

## 2022-01-02 DIAGNOSIS — Z5112 Encounter for antineoplastic immunotherapy: Secondary | ICD-10-CM | POA: Insufficient documentation

## 2022-01-02 MED ORDER — GOSERELIN ACETATE 3.6 MG ~~LOC~~ IMPL
3.6000 mg | DRUG_IMPLANT | Freq: Once | SUBCUTANEOUS | Status: AC
  Administered 2022-01-02: 3.6 mg via SUBCUTANEOUS
  Filled 2022-01-02: qty 3.6

## 2022-01-09 ENCOUNTER — Inpatient Hospital Stay: Payer: Federal, State, Local not specified - PPO

## 2022-01-09 ENCOUNTER — Other Ambulatory Visit: Payer: Self-pay

## 2022-01-09 VITALS — BP 129/90 | HR 96 | Temp 98.1°F | Resp 16 | Ht 65.0 in | Wt 211.2 lb

## 2022-01-09 DIAGNOSIS — Z95828 Presence of other vascular implants and grafts: Secondary | ICD-10-CM

## 2022-01-09 DIAGNOSIS — Z5111 Encounter for antineoplastic chemotherapy: Secondary | ICD-10-CM | POA: Diagnosis not present

## 2022-01-09 DIAGNOSIS — Z170 Estrogen receptor positive status [ER+]: Secondary | ICD-10-CM

## 2022-01-09 DIAGNOSIS — Z5112 Encounter for antineoplastic immunotherapy: Secondary | ICD-10-CM | POA: Diagnosis not present

## 2022-01-09 DIAGNOSIS — C50412 Malignant neoplasm of upper-outer quadrant of left female breast: Secondary | ICD-10-CM | POA: Diagnosis not present

## 2022-01-09 LAB — CMP (CANCER CENTER ONLY)
ALT: 16 U/L (ref 0–44)
AST: 18 U/L (ref 15–41)
Albumin: 4.2 g/dL (ref 3.5–5.0)
Alkaline Phosphatase: 69 U/L (ref 38–126)
Anion gap: 9 (ref 5–15)
BUN: 16 mg/dL (ref 6–20)
CO2: 28 mmol/L (ref 22–32)
Calcium: 10.3 mg/dL (ref 8.9–10.3)
Chloride: 103 mmol/L (ref 98–111)
Creatinine: 0.78 mg/dL (ref 0.44–1.00)
GFR, Estimated: >60 mL/min (ref >60)
Glucose, Bld: 162 mg/dL — ABNORMAL HIGH (ref 70–99)
Potassium: 3.2 mmol/L — ABNORMAL LOW (ref 3.5–5.1)
Sodium: 140 mmol/L (ref 135–145)
Total Bilirubin: 0.6 mg/dL (ref 0.3–1.2)
Total Protein: 7.2 g/dL (ref 6.5–8.1)

## 2022-01-09 LAB — CBC WITH DIFFERENTIAL (CANCER CENTER ONLY)
Abs Immature Granulocytes: 0.01 10*3/uL (ref 0.00–0.07)
Basophils Absolute: 0 10*3/uL (ref 0.0–0.1)
Basophils Relative: 0 %
Eosinophils Absolute: 0.1 10*3/uL (ref 0.0–0.5)
Eosinophils Relative: 2 %
HCT: 33.2 % — ABNORMAL LOW (ref 36.0–46.0)
Hemoglobin: 11.3 g/dL — ABNORMAL LOW (ref 12.0–15.0)
Immature Granulocytes: 0 %
Lymphocytes Relative: 36 %
Lymphs Abs: 1.8 10*3/uL (ref 0.7–4.0)
MCH: 28.8 pg (ref 26.0–34.0)
MCHC: 34 g/dL (ref 30.0–36.0)
MCV: 84.7 fL (ref 80.0–100.0)
Monocytes Absolute: 0.2 10*3/uL (ref 0.1–1.0)
Monocytes Relative: 4 %
Neutro Abs: 3 10*3/uL (ref 1.7–7.7)
Neutrophils Relative %: 58 %
Platelet Count: 335 10*3/uL (ref 150–400)
RBC: 3.92 MIL/uL (ref 3.87–5.11)
RDW: 13.4 % (ref 11.5–15.5)
WBC Count: 5.2 10*3/uL (ref 4.0–10.5)
nRBC: 0 % (ref 0.0–0.2)

## 2022-01-09 MED ORDER — SODIUM CHLORIDE 0.9% FLUSH
10.0000 mL | Freq: Once | INTRAVENOUS | Status: AC
  Administered 2022-01-09: 10 mL

## 2022-01-09 MED ORDER — HEPARIN SOD (PORK) LOCK FLUSH 100 UNIT/ML IV SOLN
500.0000 [IU] | Freq: Once | INTRAVENOUS | Status: AC
  Administered 2022-01-09: 500 [IU]

## 2022-01-09 MED ORDER — ACETAMINOPHEN 325 MG PO TABS
650.0000 mg | ORAL_TABLET | Freq: Once | ORAL | Status: AC
  Administered 2022-01-09: 650 mg via ORAL
  Filled 2022-01-09: qty 2

## 2022-01-09 MED ORDER — DIPHENHYDRAMINE HCL 25 MG PO CAPS
50.0000 mg | ORAL_CAPSULE | Freq: Once | ORAL | Status: AC
  Administered 2022-01-09: 50 mg via ORAL
  Filled 2022-01-09: qty 2

## 2022-01-09 MED ORDER — TRASTUZUMAB-DKST CHEMO 150 MG IV SOLR
6.0000 mg/kg | Freq: Once | INTRAVENOUS | Status: AC
  Administered 2022-01-09: 567 mg via INTRAVENOUS
  Filled 2022-01-09: qty 27

## 2022-01-09 MED ORDER — SODIUM CHLORIDE 0.9 % IV SOLN: INTRAVENOUS | Status: DC

## 2022-01-09 NOTE — Patient Instructions (Signed)
[facility]   Discharge Instructions: Thank you for choosing [facility] to provide your oncology and hematology care.   If you have a lab appointment with the Cancer Center, please go directly to the Cancer Center and check in at the registration area.   Wear comfortable clothing and clothing appropriate for easy access to any Portacath or PICC line.   We strive to give you quality time with your provider. You may need to reschedule your appointment if you arrive late (15 or more minutes).  Arriving late affects you and other patients whose appointments are after yours.  Also, if you miss three or more appointments without notifying the office, you may be dismissed from the clinic at the provider's discretion.      For prescription refill requests, have your pharmacy contact our office and allow 72 hours for refills to be completed.    Today you received the following chemotherapy and/or immunotherapy agents: [id number]      To help prevent nausea and vomiting after your treatment, we encourage you to take your nausea medication as directed.  BELOW ARE SYMPTOMS THAT SHOULD BE REPORTED IMMEDIATELY: *FEVER GREATER THAN 100.4 F (38 C) OR HIGHER *CHILLS OR SWEATING *NAUSEA AND VOMITING THAT IS NOT CONTROLLED WITH YOUR NAUSEA MEDICATION *UNUSUAL SHORTNESS OF BREATH *UNUSUAL BRUISING OR BLEEDING *URINARY PROBLEMS (pain or burning when urinating, or frequent urination) *BOWEL PROBLEMS (unusual diarrhea, constipation, pain near the anus) TENDERNESS IN MOUTH AND THROAT WITH OR WITHOUT PRESENCE OF ULCERS (sore throat, sores in mouth, or a toothache) UNUSUAL RASH, SWELLING OR PAIN  UNUSUAL VAGINAL DISCHARGE OR ITCHING   Items with * indicate a potential emergency and should be followed up as soon as possible or go to the Emergency Department if any problems should occur.  Please show the CHEMOTHERAPY ALERT CARD or IMMUNOTHERAPY ALERT CARD at  check-in to the Emergency Department and triage nurse.  Should you have questions after your visit or need to cancel or reschedule your appointment, please contact [facility]  Dept: [phone]  and follow the prompts.  Office hours are 8:00 a.m. to 4:30 p.m. Monday - Friday. Please note that voicemails left after 4:00 p.m. may not be returned until the following business day.  We are closed weekends and major holidays. You have access to a nurse at all times for urgent questions. Please call the main number to the clinic Dept: [phone] and follow the prompts.   For any non-urgent questions, you may also contact your provider using MyChart. We now offer e-Visits for anyone 18 and older to request care online for non-urgent symptoms. For details visit [email or web address].   Also download the MyChart app! Go to the app store, search "MyChart", open the app, select [facility], and log in with your MyChart username and password.  Masks are optional in the cancer centers. If you would like for your care team to wear a mask while they are taking care of you, please let them know. For doctor visits, patients may have with them one support person who is at least 50 years old. At this time, visitors are not allowed in the infusion area. 

## 2022-01-12 ENCOUNTER — Telehealth: Payer: Self-pay | Admitting: *Deleted

## 2022-01-12 MED ORDER — POTASSIUM CHLORIDE CRYS ER 10 MEQ PO TBCR: 10.0000 meq | EXTENDED_RELEASE_TABLET | Freq: Every day | ORAL | 2 refills | Status: DC

## 2022-01-12 NOTE — Telephone Encounter (Signed)
-----   Message from Estella Husk, LPN sent at 2/57/5051 10:59 AM EDT -----  ----- Message ----- From: Truitt Merle, MD Sent: 01/10/2022   2:47 PM EDT To: Estella Husk, LPN; Evalee Jefferson, RN  Please let pt know her K is still low, and call in KCl 8mq daily X30 with 2 refills for her, this is probably related to her HCTZ.   YTruitt Merle

## 2022-01-12 NOTE — Telephone Encounter (Signed)
Notified of message below. Rx sent

## 2022-01-21 DIAGNOSIS — Z1502 Genetic susceptibility to malignant neoplasm of ovary: Secondary | ICD-10-CM | POA: Diagnosis not present

## 2022-01-22 ENCOUNTER — Encounter (HOSPITAL_BASED_OUTPATIENT_CLINIC_OR_DEPARTMENT_OTHER): Payer: Self-pay | Admitting: Obstetrics and Gynecology

## 2022-01-23 ENCOUNTER — Encounter (HOSPITAL_BASED_OUTPATIENT_CLINIC_OR_DEPARTMENT_OTHER): Payer: Self-pay | Admitting: Obstetrics and Gynecology

## 2022-01-23 NOTE — Progress Notes (Signed)
Spoke w/ via phone for pre-op interview--- pt Lab needs dos---- [facility]  (pt will be having chemo infusion on 01-30-2022)             Lab results------ current ekg in epic/ chart COVID test -----patient states asymptomatic no test needed Arrive at ------- 0530 on 02-02-2022 NPO after MN NO Solid Food.  Clear liquids from MN until--- 0430 Med rec completed Medications to take morning of surgery ----- letrozole Diabetic medication ----- n/a Patient instructed no nail polish to be worn day of surgery Patient instructed to bring photo id and insurance card day of surgery Patient aware to have Driver (ride ) / caregiver for 24 hours after surgery -- husband, rodney Patient Special Instructions ----- n/a Pre-Op special Istructions ----- n/a Patient verbalized understanding of instructions that were given at this phone interview. Patient denies shortness of breath, chest pain, fever, cough at this phone interview.

## 2022-01-23 NOTE — H&P (Signed)
Joanna Osborn, [facility] MEDICAL RECORD NO: [id number] ACCOUNT NO: [id number] DATE OF BIRTH: 04-May-1971 FACILITY: [facility] LOCATION: [facility] PHYSICIAN: Ralene Bathe. Matthew Saras, MD  History and Physical   DATE OF ADMISSION: 02/02/2022  Date of scheduled surgery [facility] 02/02/2022  CHIEF COMPLAINT:  Estrogen positive breast cancer.  HISTORY OF PRESENT ILLNESS:  This patient see Dr. Orvan Seen in our office.  She is a 51 year old G2 P2 has a 90 and 50 year old, has been treated for stage IB breast cancer, weak ER positive, HER-2 positive, treated with lumpectomy and now on antiestrogen  therapy.  After discussions with her oncologist, she is electing for laparoscopic BSO, so that her antiestrogen therapy can be better tailored.  This procedure including risks regarding bleeding, infection, adjacent organ injury, wound infection,  phlebitis, the possible need to complete the surgery by an open technique.  All reviewed with her, which she understands and accepts.  PAST MEDICAL HISTORY:   ALLERGIES:  None.  CURRENT MEDICATIONS:  Hydrochlorothiazide 25 mg daily, Klor-Con extended release once daily, Zoladex.  OBSTETRIC HISTORY:  She is a G2, P2, 2 vaginal deliveries.  FAMILY HISTORY:  Significant for father and mother.  No current problems.  Grandmother had malignant tumor of the colon.  SOCIAL HISTORY:  She is married.  She is a Theme park manager, never smoker.  Occasional alcohol use.  PAST SURGICAL HISTORY:  She has had a cholecystectomy, left upper quadrant hernia mesh repair, laparoscopic tubal ligation and breast lumpectomy.  PHYSICAL EXAMINATION:   VITAL SIGNS:  Temperature 98.2, blood pressure 108/78, weight 209. HEENT:  Unremarkable. NECK:  Supple, without masses.  Thyroid nonpalpable. HEART AND LUNGS:  Clear. BREASTS: Prior lumpectomy noted.  No other masses or abnormalities noted. ABDOMEN:  Soft, flat, nontender. PELVIC:  Vulva, vagina, cervix normal.  Uterus mid position, normal size.  Adnexa  unremarkable. EXTREMITIES:  Unremarkable. NEUROLOGIC:  Unremarkable.  ASSESSMENT:  Estrogen receptor positive breast cancer as noted above.  PLAN:  Laparoscopic BSO.  Procedure and risks reviewed as noted above.     SUJ D: 01/21/2022 3:15:11 pm T: 01/22/2022 1:43:00 am  JOB: [id number]/ [id number]

## 2022-01-30 ENCOUNTER — Other Ambulatory Visit: Payer: Self-pay

## 2022-01-30 ENCOUNTER — Inpatient Hospital Stay: Payer: Federal, State, Local not specified - PPO

## 2022-01-30 ENCOUNTER — Inpatient Hospital Stay: Payer: Federal, State, Local not specified - PPO | Attending: Hematology | Admitting: Hematology

## 2022-01-30 ENCOUNTER — Encounter: Payer: Self-pay | Admitting: *Deleted

## 2022-01-30 VITALS — BP 120/78 | HR 99 | Temp 99.1°F | Resp 18 | Ht 65.0 in | Wt 217.8 lb

## 2022-01-30 DIAGNOSIS — Z5112 Encounter for antineoplastic immunotherapy: Secondary | ICD-10-CM | POA: Diagnosis not present

## 2022-01-30 DIAGNOSIS — Z79899 Other long term (current) drug therapy: Secondary | ICD-10-CM | POA: Insufficient documentation

## 2022-01-30 DIAGNOSIS — C50412 Malignant neoplasm of upper-outer quadrant of left female breast: Secondary | ICD-10-CM | POA: Insufficient documentation

## 2022-01-30 DIAGNOSIS — Z170 Estrogen receptor positive status [ER+]: Secondary | ICD-10-CM | POA: Diagnosis not present

## 2022-01-30 DIAGNOSIS — Z95828 Presence of other vascular implants and grafts: Secondary | ICD-10-CM

## 2022-01-30 MED ORDER — DIPHENHYDRAMINE HCL 25 MG PO CAPS
50.0000 mg | ORAL_CAPSULE | Freq: Once | ORAL | Status: AC
  Administered 2022-01-30: 50 mg via ORAL
  Filled 2022-01-30: qty 2

## 2022-01-30 MED ORDER — HEPARIN SOD (PORK) LOCK FLUSH 100 UNIT/ML IV SOLN
500.0000 [IU] | Freq: Once | INTRAVENOUS | Status: AC
  Administered 2022-01-30: 500 [IU]

## 2022-01-30 MED ORDER — TRASTUZUMAB-DKST CHEMO 150 MG IV SOLR
6.0000 mg/kg | Freq: Once | INTRAVENOUS | Status: AC
  Administered 2022-01-30: 567 mg via INTRAVENOUS
  Filled 2022-01-30: qty 27

## 2022-01-30 MED ORDER — ACETAMINOPHEN 325 MG PO TABS
650.0000 mg | ORAL_TABLET | Freq: Once | ORAL | Status: AC
  Administered 2022-01-30: 650 mg via ORAL
  Filled 2022-01-30: qty 2

## 2022-01-30 MED ORDER — SODIUM CHLORIDE 0.9% FLUSH
10.0000 mL | Freq: Once | INTRAVENOUS | Status: AC
  Administered 2022-01-30: 10 mL

## 2022-01-30 NOTE — Patient Instructions (Signed)
[facility]   Discharge Instructions: Thank you for choosing [facility] to provide your oncology and hematology care.   If you have a lab appointment with the [facility], please go directly to the [facility] and check in at the registration area.   Wear comfortable clothing and clothing appropriate for easy access to any Portacath or PICC line.   We strive to give you quality time with your provider. You may need to reschedule your appointment if you arrive late (15 or more minutes).  Arriving late affects you and other patients whose appointments are after yours.  Also, if you miss three or more appointments without notifying the office, you may be dismissed from the clinic at the provider's discretion.      For prescription refill requests, have your pharmacy contact our office and allow 72 hours for refills to be completed.    Today you received the following chemotherapy and/or immunotherapy agents: [id number]      To help prevent nausea and vomiting after your treatment, we encourage you to take your nausea medication as directed.  BELOW ARE SYMPTOMS THAT SHOULD BE REPORTED IMMEDIATELY: *FEVER GREATER THAN 100.4 F (38 C) OR HIGHER *CHILLS OR SWEATING *NAUSEA AND VOMITING THAT IS NOT CONTROLLED WITH YOUR NAUSEA MEDICATION *UNUSUAL SHORTNESS OF BREATH *UNUSUAL BRUISING OR BLEEDING *URINARY PROBLEMS (pain or burning when urinating, or frequent urination) *BOWEL PROBLEMS (unusual diarrhea, constipation, pain near the anus) TENDERNESS IN MOUTH AND THROAT WITH OR WITHOUT PRESENCE OF ULCERS (sore throat, sores in mouth, or a toothache) UNUSUAL RASH, SWELLING OR PAIN  UNUSUAL VAGINAL DISCHARGE OR ITCHING   Items with * indicate a potential emergency and should be followed up as soon as possible or go to the Emergency Department if any problems should occur.  Please show the CHEMOTHERAPY ALERT CARD or IMMUNOTHERAPY ALERT CARD at  check-in to the Emergency Department and triage nurse.  Should you have questions after your visit or need to cancel or reschedule your appointment, please contact [facility]  Dept: [phone]  and follow the prompts.  Office hours are 8:00 a.m. to 4:30 p.m. Monday - Friday. Please note that voicemails left after 4:00 p.m. may not be returned until the following business day.  We are closed weekends and major holidays. You have access to a nurse at all times for urgent questions. Please call the main number to the clinic Dept: [phone] and follow the prompts.   For any non-urgent questions, you may also contact your provider using MyChart. We now offer e-Visits for anyone 6 and older to request care online for non-urgent symptoms. For details visit [email or web address].   Also download the MyChart app! Go to the app store, search "MyChart", open the app, select [facility], and log in with your MyChart username and password.  Masks are optional in the cancer centers. If you would like for your care team to wear a mask while they are taking care of you, please let them know. For doctor visits, patients may have with them one support person who is at least 50 years old. At this time, visitors are not allowed in the infusion area.

## 2022-01-30 NOTE — Progress Notes (Signed)
Joanna Osborn   [phone] [phone]   Clinic Follow up Note   Patient Care Team: Lennie Odor, [place] as PCP - General (Physician Assistant) Rockwell Germany, RN as Oncology Nurse Navigator Mauro Kaufmann, RN as Oncology Nurse Navigator Rolm Bookbinder, MD as Consulting Physician (General Surgery) Truitt Merle, MD as Consulting Physician (Hematology) Kyung Rudd, MD as Consulting Physician (Radiation Oncology) Truitt Merle, MD as Consulting Physician (Hematology)  Date of Service:  01/30/2022  CHIEF COMPLAINT: f/u of left breast cancer  CURRENT THERAPY:  -Maintenance herceptin, starting 08/14/21 -Letrozole, starting 11/2021  ASSESSMENT & PLAN:  Joanna Osborn is a 51 y.o.  female with   1. 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 Left mammogram on 03/05/21 showed 2.1 cm mass at 12 o'clock. Biopsy confirmed invasive ductal carcinoma, grade 3, weakly ER+ and Her2+ -Breast MRI 03/26/21 showed additional areas of concern-- a left breast/axilla lymph node, and a right breast mass. Right breast biopsy 04/23/21 showed complex sclerosing lesion. -echo from 04/02/21 reviewed, no concerns. Repeat 09/03/21 stable. -She received 6 cycles of neoadjuvant TCHP with G-CSF 04/03/21 - 07/24/21 (Docetaxel dose reduced from C4 due to neuropathy). -she moved to maintenance HP on 08/14/21.  -she underwent lumpectomy on 08/25/21 with Dr. Donne Hazel. She had a complete response, no residual disease seen in breast and nodes   -given her good response, she is on maintenance trastuzumab alone to complete 1 year therapy.  -s/p radiation 10/02/20 - 11/18/21 under Dr. Lisbeth Renshaw. -she was started on Zoladex on 11/07/21, BSO is scheduled for 02/02/22 with Dr. Matthew Saras -she started letrozole in 11/2021.  She is tolerating well, will continue for total of 5-7 years.  -We discussed small  benefit follow-up adjuvant neratinib.  Given her node-negative disease, and complete response after neoadjuvant chemotherapy, I think the benefit is very small,.  She voiced good understanding. -We will proceed with trastuzumab today, and continue for last 2 in next 3 to 6 weeks. -We will send a message to Dr. Donne Hazel for her port removal in late September   2. Pancreas lesion -noted on CT AP on 09/12/21 for work up of diarrhea and abdominal pain. -abdomen MRI on 11/25/21 showed a 2 cm complex cystic mass, suggesting cystic neoplasm.  -she is scheduled for endoscopy on 02/19/22 with Dr. Rush Landmark   3. Mild Anemia -her hgb has dropped recently secondary to chemo  -She began oral iron -she received blood transfusion on 07/18/21. -improving   4. 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 Anxiety and depression, HTN -continue HCTZ and f/u with PCP     PLAN: -proceed with herceptin infusion today and every 3 weeks for 2 more cycles  -continue letrozole -BSO scheduled for July 10, no need Zoladex injection today -Lab and follow-up in 3 months -Repeat echocardiogram before next Herceptin infusion   No problem-specific Assessment & Plan notes found for this encounter.   SUMMARY OF ONCOLOGIC HISTORY: Oncology History Overview Note   Cancer Staging  Malignant neoplasm of upper-outer quadrant of left breast in female, estrogen receptor positive ([name]) Staging form: Breast, AJCC 8th Edition - Clinical stage from 03/07/2021: Stage IB (cT2, cN0, cM0, G3, ER+, PR+, HER2+) - Signed by Truitt Merle, MD on 03/17/2021 Stage prefix: Initial diagnosis Histologic grading system: 3 grade system - Pathologic stage  from 08/25/2021: No Stage Recommended (ypT0, pN0, cM0, G3, ER+, PR+, HER2+) - Signed by Truitt Merle, MD on 09/05/2021 Stage prefix: Post-therapy Response to neoadjuvant therapy: Complete response Nuclear grade:  G3 Histologic grading system: 3 grade system     Malignant neoplasm of upper-outer quadrant of left breast in female, estrogen receptor positive ([name])  03/05/2021 Mammogram   Diagnostic Left Mammogram; Left Breast Ultrasound  IMPRESSION: At the palpable site of concern in the left breast at 12 o'clock there is a suspicious mass measuring 2.1 cm.   03/07/2021 Cancer Staging   Staging form: Breast, AJCC 8th Edition - Clinical stage from 03/07/2021: Stage IB (cT2, cN0, cM0, G3, ER+, PR+, HER2+) - Signed by Truitt Merle, MD on 03/17/2021 Stage prefix: Initial diagnosis Histologic grading system: 3 grade system   03/07/2021 Pathology Results   Diagnosis Breast, left, needle core biopsy, left breast 12 o' clock - INVASIVE DUCTAL CARCINOMA - SEE COMMENT Based on the biopsy, the carcinoma appears Nottingham grade 3 of 3  PROGNOSTIC INDICATORS Results: IMMUNOHISTOCHEMICAL AND MORPHOMETRIC ANALYSIS PERFORMED MANUALLY The tumor cells are POSITIVE for Her2 (3+). Estrogen Receptor: 30%, POSITIVE, WEAK STAINING INTENSITY Progesterone Receptor: 15%, POSITIVE, MODERATE STAINING INTENSITY Proliferation Marker Ki67: 40%     03/13/2021 Initial Diagnosis   Malignant neoplasm of upper-outer quadrant of left breast in female, estrogen receptor positive ([name])   03/26/2021 Imaging   MRI Breast  IMPRESSION: 1. 2.5 cm biopsy-proven malignancy within the UPPER LEFT breast. 2. Abnormal lymph node with focal cortical thickening in the posterior UPPER-OUTER LEFT breast/LOWER LEFT axilla. 2nd-look ultrasound with possible biopsy is recommended. 3. Indeterminate 0.8 cm OUTER RIGHT breast mass. 2nd-look ultrasound with possible biopsy is recommended.   03/29/2021 Genetic Testing   Negative hereditary cancer genetic testing: no pathogenic variants detected in [facility] CustomNext-Cancer +RNAinsight Panel.  The report date is March 29, 2021.   The CustomNext-Cancer+RNAinsight panel offered by [facility]  includes sequencing and rearrangement analysis for the following 47 genes:  APC, ATM, AXIN2, BARD1, BMPR1A, BRCA1, BRCA2, BRIP1, CDH1, CDK4, CDKN2A, CHEK2, DICER1, EPCAM, GREM1, HOXB13, MEN1, MLH1, MSH2, MSH3, MSH6, MUTYH, NBN, NF1, NF2, NTHL1, PALB2, PMS2, POLD1, POLE, PTEN, RAD51C, RAD51D, RECQL, RET, SDHA, SDHAF2, SDHB, SDHC, SDHD, SMAD4, SMARCA4, STK11, TP53, TSC1, TSC2, and VHL.  RNA data is routinely analyzed for use in variant interpretation for all genes.   04/04/2021 - 07/26/2021 Chemotherapy   Patient is on Treatment Plan : BREAST  Docetaxel + Carboplatin + Trastuzumab + Pertuzumab  (TCHP) q21d      04/14/2021 Imaging   [place] Bilateral Breast  IMPRESSION: 1. No sonographic correlate for the 8 mm enhancing mass in the outer right breast. Recommendation is to prior seed with MRI guided biopsy. 2. An undulating, low lying lymph node along the 1 o'clock axis of the left breast demonstrates no areas of cortical thickening beyond 3 mm.   08/14/2021 -  Chemotherapy   Patient is on Treatment Plan : BREAST Trastuzumab + Pertuzumab q21d     08/25/2021 Cancer Staging   Staging form: Breast, AJCC 8th Edition - Pathologic stage from 08/25/2021: No Stage Recommended (ypT0, pN0, cM0, G3, ER+, PR+, HER2+) - Signed by Truitt Merle, MD on 09/05/2021 Stage prefix: Post-therapy Response to neoadjuvant therapy: Complete response Nuclear grade: G3 Histologic grading system: 3 grade system   08/25/2021 Definitive Surgery   FINAL MICROSCOPIC DIAGNOSIS:   A. BREAST, LEFT, LUMPECTOMY:  - No evidence of residual carcinoma - complete therapeutic response  - Therapy related changes, including hyalin  fibrosis  - Fibrocystic change with urinary  - See comment   B. BREAST, LEFT ADDITIONAL MEDIAL MARGIN, EXCISION:  - Fibrocystic change  - Negative for carcinoma   C. BREAST, LEFT ADDITIONAL SUPERIOR MARGIN, EXCISION:  - Fibrocystic change  - Negative for carcinoma   D. BREAST, LEFT ADDITIONAL POSTERIOR MARGIN,  EXCISION:  - Benign breast parenchyma  - Negative for carcinoma   E. BREAST, LEFT ADDITIONAL ANTERIOR MARGIN, EXCISION:  - Benign breast parenchyma  - Negative for carcinoma   F. LYMPH NODE, LEFT AXILLARY, SENTINEL, EXCISION:  - Lymph node, negative for carcinoma (0/1)   G. LYMPH NODE, LEFT AXILLARY, SENTINEL, EXCISION:  - Lymph node, negative for carcinoma (0/1)    COMMENT:  The appropriate pathologic stage (AJCC eighth edition) is ypT0 ypN0.       INTERVAL HISTORY:  DILLYN JOAQUIN is here for a follow up of breast cancer. She was last seen by me on 12/05/21. She presents to the clinic alone.  She is doing well overall, denies any pain or other discomfort.  She has good appetite and energy level.  She is tolerating trastuzumab infusion well.  Her hair has grown, she has a mild neuropathy, impact on her functions.  No other complaints.   All other systems were reviewed with the patient and are negative.  MEDICAL HISTORY:  Past Medical History:  Diagnosis Date   Anemia    Anxiety    Chemotherapy-induced peripheral neuropathy ([place])    per pt toes   Depression    Family history of colon cancer 03/19/2021   History of chemotherapy    left breast cancer  04-04-2021  to 07-26-2021 and restarted 08-14-2021   History of external beam radiation therapy    left breast cancer  10-02-2021  to 11-18-2021   History of kidney stones    Hypertension    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   Malignant neoplasm of upper-outer quadrant of left breast in female, estrogen receptor positive ([place]) 03/13/2021   oncologist--- dr Burr Medico;  Stage IB, IDC, ER/ PR/ HER2 +;   chemo 04-04-2021  to 07-26-2021 and started 08-14-2021;  raditation completed 11-18-2021   Port-A-Cath in place 04/02/2021   Wears glasses     SURGICAL HISTORY: Past Surgical History:  Procedure Laterality Date   BREAST LUMPECTOMY WITH RADIOACTIVE SEED AND SENTINEL LYMPH NODE BIOPSY Left 08/25/2021   Procedure: LEFT BREAST LUMPECTOMY WITH  RADIOACTIVE SEED AND AXILLARY SENTINEL LYMPH NODE BIOPSY;  Surgeon: Rolm Bookbinder, MD;  Location: [facility];  Service: General;  Laterality: Left;   CARPAL TUNNEL RELEASE Right 08/17/2008   @[facility]  by dr Fredna Dow;   and excision wrist ganglian cyst   CHOLECYSTECTOMY, LAPAROSCOPIC  06/27/2002   @[facility]  by dr Bubba Camp   HYSTEROSCOPY WITH D & C  11/21/2007   @[facility]  by dr Julien Girt;  polypectomy   INSERTION OF MESH N/A 10/28/2016   Procedure: INSERTION OF MESH;  Surgeon: Clovis [name], MD;  Location: [facility];  Service: General;  Laterality: N/A;   PORTACATH PLACEMENT Right 04/02/2021   Procedure: INSERTION PORT-A-CATH;  Surgeon: Rolm Bookbinder, MD;  Location: [facility];  Service: General;  Laterality: Right;   [place] N/A 10/28/2016   Procedure: LAPAROSCOPIC VENTRAL HERNIA REPAIR;  Surgeon: Clovis [name], MD;  Location: [facility];  Service: General;  Laterality: N/A;    I have reviewed the social history and family history with the patient and they are unchanged  from previous note.  ALLERGIES:  is allergic to endocet [oxycodone-acetaminophen] and tape.  MEDICATIONS:  Current Outpatient Medications  Medication Sig Dispense Refill   acetaminophen (TYLENOL) 325 MG tablet Take 650 mg by mouth every 6 (six) hours as needed for moderate pain.     ergocalciferol (VITAMIN D2) 1.25 MG (50000 UT) capsule Take 1 capsule by mouth every Tuesday.     hydrochlorothiazide (HYDRODIURIL) 25 MG tablet Take 1 tablet (25 mg total) by mouth daily. (Patient taking differently: Take 25 mg by mouth daily.) 30 tablet 0   letrozole (FEMARA) 2.5 MG tablet Take 1 tablet (2.5 mg total) by mouth daily. (Patient taking differently: Take 2.5 mg by mouth daily.) 30 tablet 3   potassium chloride (KLOR-CON M) 10 MEQ tablet Take 1 tablet (10 mEq total) by mouth daily. 30 tablet 2   No current facility-administered medications for this visit.    PHYSICAL  EXAMINATION: ECOG PERFORMANCE STATUS: 0 - Asymptomatic  Vitals:   01/30/22 1424  BP: 120/78  Pulse: 99  Resp: 18  Temp: 99.1 F (37.3 C)  SpO2: 100%   Wt Readings from Last 3 Encounters:  01/30/22 217 lb 12.8 oz (98.8 kg)  01/09/22 211 lb 4 oz (95.8 kg)  12/19/21 217 lb 9 oz (98.7 kg)     GENERAL:alert, no distress and comfortable SKIN: skin color, texture, turgor are normal, no rashes or significant lesions EYES: normal, Conjunctiva are pink and non-injected, sclera clear NEURO: alert & oriented x 3 with fluent speech, no focal motor/sensory deficits   LABORATORY DATA:  I have reviewed the data as listed    Latest Ref Rng & Units 01/09/2022    2:02 PM 11/07/2021    2:18 PM 09/14/2021    6:43 AM  CBC  WBC 4.0 - 10.5 K/uL 5.2  4.4  11.4   Hemoglobin 12.0 - 15.0 g/dL 11.3  11.5  8.4   Hematocrit 36.0 - 46.0 % 33.2  35.6  26.8   Platelets 150 - 400 K/uL 335  317  334         Latest Ref Rng & Units 01/09/2022    2:02 PM 11/07/2021    2:18 PM 09/14/2021    6:43 AM  CMP  Glucose 70 - 99 mg/dL 162  97  127   BUN 6 - 20 mg/dL 16  19  10    Creatinine 0.44 - 1.00 mg/dL 0.78  0.77  0.72   Sodium 135 - 145 mmol/L 140  138  141   Potassium 3.5 - 5.1 mmol/L 3.2  3.8  3.2   Chloride 98 - 111 mmol/L 103  104  108   CO2 22 - 32 mmol/L 28  28  22    Calcium 8.9 - 10.3 mg/dL 10.3  10.0  9.0   Total Protein 6.5 - 8.1 g/dL 7.2  7.4    Total Bilirubin 0.3 - 1.2 mg/dL 0.6  0.6    Alkaline Phos 38 - 126 U/L 69  60    AST 15 - 41 U/L 18  16    ALT 0 - 44 U/L 16  16        RADIOGRAPHIC STUDIES: I have personally reviewed the radiological images as listed and agreed with the findings in the report. No results found.    Orders Placed This Encounter  Procedures   ECHOCARDIOGRAM COMPLETE    Standing Status:   Future    Standing Expiration Date:   01/31/2023    Order Specific Question:  Where should this test be performed    Answer:   [facility]    Order Specific Question:    Perflutren DEFINITY (image enhancing agent) should be administered unless hypersensitivity or allergy exist    Answer:   Administer Perflutren    Order Specific Question:   Is a special reader required? (athlete or structural heart)    Answer:   No    Order Specific Question:   Does this study need to be read by the Structural team/Level 3 readers?    Answer:   No    Order Specific Question:   Reason for exam-Echo    Answer:   Chemo  Z09   All questions were answered. The patient knows to call the clinic with any problems, questions or concerns. No barriers to learning was detected. The total time spent in the appointment was 30 minutes.     Truitt Merle, MD 01/30/2022   I, Wilburn Mylar, am acting as scribe for Truitt Merle, MD.   I have reviewed the above documentation for accuracy and completeness, and I agree with the above.

## 2022-02-01 NOTE — Anesthesia Preprocedure Evaluation (Signed)
Anesthesia Evaluation  Patient identified by MRN, date of birth, ID band Patient awake    Reviewed: Allergy & Precautions, NPO status , Patient's Chart, lab work & pertinent test results  Airway Mallampati: II  TM Distance: >3 FB Neck ROM: Full    Dental no notable dental hx. (+) Dental Advisory Given, Teeth Intact   Pulmonary neg pulmonary ROS,    Pulmonary exam normal breath sounds clear to auscultation       Cardiovascular hypertension, Pt. on medications Normal cardiovascular exam Rhythm:Regular Rate:Normal  Echo: 1. Left ventricular ejection fraction, by estimation, is 60 to 65%. The  left ventricle has normal function. The left ventricle has no regional  wall motion abnormalities. There is mild left ventricular hypertrophy of  the basal-septal segment. Left  ventricular diastolic parameters are consistent with Grade I diastolic  dysfunction (impaired relaxation). The average left ventricular global  longitudinal strain is -21.0 %. The global longitudinal strain is normal.  2. Right ventricular systolic function is normal. The right ventricular  size is mildly enlarged. There is normal pulmonary artery systolic  pressure.  3. The mitral valve is normal in structure. Trivial mitral valve  regurgitation. No evidence of mitral stenosis.  4. The aortic valve is tricuspid. Aortic valve regurgitation is not  visualized. Mild aortic valve sclerosis is present, with no evidence of  aortic valve stenosis.  5. The inferior vena cava is normal in size with greater than 50%  respiratory variability, suggesting right atrial pressure of 3 mmHg.    Neuro/Psych PSYCHIATRIC DISORDERS Anxiety Depression negative neurological ROS     GI/Hepatic negative GI ROS, Neg liver ROS,   Endo/Other  negative endocrine ROS  Renal/GU negative Renal ROS     Musculoskeletal negative musculoskeletal ROS (+)   Abdominal (+) + obese,    Peds  Hematology  (+) Blood dyscrasia, anemia ,   Anesthesia Other Findings   Reproductive/Obstetrics negative OB ROS                            Anesthesia Physical  Anesthesia Plan  ASA: 2  Anesthesia Plan: General   Post-op Pain Management: Tylenol PO (pre-op)*, Toradol IV (intra-op)* and Gabapentin PO (pre-op)*   Induction: Intravenous  PONV Risk Score and Plan: 4 or greater and Ondansetron, Dexamethasone, Midazolam, Scopolamine patch - Pre-op and Treatment may vary due to age or medical condition  Airway Management Planned: Oral ETT  Additional Equipment: None  Intra-op Plan:   Post-operative Plan: Extubation in OR  Informed Consent: I have reviewed the patients History and Physical, chart, labs and discussed the procedure including the risks, benefits and alternatives for the proposed anesthesia with the patient or authorized representative who has indicated his/her understanding and acceptance.     Dental advisory given  Plan Discussed with: CRNA  Anesthesia Plan Comments:        Anesthesia Quick Evaluation

## 2022-02-02 ENCOUNTER — Encounter (HOSPITAL_BASED_OUTPATIENT_CLINIC_OR_DEPARTMENT_OTHER)
Admission: RE | Disposition: A | Discharge status: Home / Self Care | Payer: Self-pay | Source: Home / Self Care | Attending: Obstetrics and Gynecology

## 2022-02-02 ENCOUNTER — Telehealth: Payer: Self-pay | Admitting: Hematology

## 2022-02-02 ENCOUNTER — Other Ambulatory Visit: Payer: Self-pay

## 2022-02-02 ENCOUNTER — Ambulatory Visit (HOSPITAL_BASED_OUTPATIENT_CLINIC_OR_DEPARTMENT_OTHER)
Admission: RE | Admit: 2022-02-02 | Discharge: 2022-02-02 | Disposition: A | Discharge status: Home / Self Care | Payer: Federal, State, Local not specified - PPO | Attending: Obstetrics and Gynecology | Admitting: Obstetrics and Gynecology

## 2022-02-02 ENCOUNTER — Ambulatory Visit (HOSPITAL_BASED_OUTPATIENT_CLINIC_OR_DEPARTMENT_OTHER): Payer: Federal, State, Local not specified - PPO | Admitting: Anesthesiology

## 2022-02-02 ENCOUNTER — Encounter (HOSPITAL_BASED_OUTPATIENT_CLINIC_OR_DEPARTMENT_OTHER): Payer: Self-pay | Admitting: Obstetrics and Gynecology

## 2022-02-02 DIAGNOSIS — C50919 Malignant neoplasm of unspecified site of unspecified female breast: Secondary | ICD-10-CM | POA: Diagnosis not present

## 2022-02-02 DIAGNOSIS — N838 Other noninflammatory disorders of ovary, fallopian tube and broad ligament: Secondary | ICD-10-CM | POA: Insufficient documentation

## 2022-02-02 DIAGNOSIS — Z79899 Other long term (current) drug therapy: Secondary | ICD-10-CM | POA: Diagnosis not present

## 2022-02-02 DIAGNOSIS — I10 Essential (primary) hypertension: Secondary | ICD-10-CM | POA: Diagnosis not present

## 2022-02-02 DIAGNOSIS — C50412 Malignant neoplasm of upper-outer quadrant of left female breast: Secondary | ICD-10-CM

## 2022-02-02 DIAGNOSIS — Z01818 Encounter for other preprocedural examination: Secondary | ICD-10-CM

## 2022-02-02 DIAGNOSIS — Z170 Estrogen receptor positive status [ER+]: Secondary | ICD-10-CM | POA: Diagnosis not present

## 2022-02-02 DIAGNOSIS — Z1502 Genetic susceptibility to malignant neoplasm of ovary: Secondary | ICD-10-CM | POA: Diagnosis not present

## 2022-02-02 DIAGNOSIS — K869 Disease of pancreas, unspecified: Secondary | ICD-10-CM

## 2022-02-02 DIAGNOSIS — Z4002 Encounter for prophylactic removal of ovary: Secondary | ICD-10-CM | POA: Diagnosis not present

## 2022-02-02 DIAGNOSIS — Z853 Personal history of malignant neoplasm of breast: Secondary | ICD-10-CM | POA: Diagnosis not present

## 2022-02-02 HISTORY — DX: Personal history of irradiation: Z92.3

## 2022-02-02 HISTORY — DX: Presence of spectacles and contact lenses: Z97.3

## 2022-02-02 HISTORY — DX: Anemia, unspecified: D64.9

## 2022-02-02 HISTORY — DX: Personal history of antineoplastic chemotherapy: Z92.21

## 2022-02-02 HISTORY — PX: LAPAROSCOPIC SALPINGO OOPHERECTOMY: SHX5927

## 2022-02-02 HISTORY — DX: Drug-induced polyneuropathy: G62.0

## 2022-02-02 LAB — POCT PREGNANCY, URINE: Preg Test, Ur: NEGATIVE

## 2022-02-02 LAB — TYPE AND SCREEN
ABO/RH(D): A POS
Antibody Screen: NEGATIVE

## 2022-02-02 SURGERY — SALPINGO-OOPHORECTOMY, LAPAROSCOPIC
Anesthesia: General | Site: Abdomen | Laterality: Bilateral

## 2022-02-02 MED ORDER — HYDROMORPHONE HCL 1 MG/ML IJ SOLN: 0.2500 mg | INTRAMUSCULAR | Status: DC | PRN

## 2022-02-02 MED ORDER — PROPOFOL 10 MG/ML IV BOLUS
INTRAVENOUS | Status: AC
  Filled 2022-02-02: qty 20

## 2022-02-02 MED ORDER — ROCURONIUM BROMIDE 10 MG/ML (PF) SYRINGE
PREFILLED_SYRINGE | INTRAVENOUS | Status: AC
  Filled 2022-02-02: qty 10

## 2022-02-02 MED ORDER — SODIUM CHLORIDE 0.9 % IV SOLN
2.0000 g | INTRAVENOUS | Status: AC
  Administered 2022-02-02: 2 g via INTRAVENOUS

## 2022-02-02 MED ORDER — ONDANSETRON HCL 4 MG/2ML IJ SOLN
INTRAMUSCULAR | Status: AC
  Filled 2022-02-02: qty 2

## 2022-02-02 MED ORDER — FENTANYL CITRATE (PF) 250 MCG/5ML IJ SOLN
INTRAMUSCULAR | Status: AC
  Filled 2022-02-02: qty 5

## 2022-02-02 MED ORDER — PHENYLEPHRINE 80 MCG/ML (10ML) SYRINGE FOR IV PUSH (FOR BLOOD PRESSURE SUPPORT)
PREFILLED_SYRINGE | INTRAVENOUS | Status: AC
  Filled 2022-02-02: qty 10

## 2022-02-02 MED ORDER — KETAMINE HCL 50 MG/5ML IJ SOSY
PREFILLED_SYRINGE | INTRAMUSCULAR | Status: AC
  Filled 2022-02-02: qty 5

## 2022-02-02 MED ORDER — SUGAMMADEX SODIUM 200 MG/2ML IV SOLN
INTRAVENOUS | Status: DC | PRN
  Administered 2022-02-02: 200 mg via INTRAVENOUS

## 2022-02-02 MED ORDER — ACETAMINOPHEN 500 MG PO TABS
ORAL_TABLET | ORAL | Status: AC
  Filled 2022-02-02: qty 2

## 2022-02-02 MED ORDER — GABAPENTIN 300 MG PO CAPS
300.0000 mg | ORAL_CAPSULE | Freq: Once | ORAL | Status: AC
  Administered 2022-02-02: 300 mg via ORAL

## 2022-02-02 MED ORDER — DEXAMETHASONE SODIUM PHOSPHATE 10 MG/ML IJ SOLN
INTRAMUSCULAR | Status: AC
  Filled 2022-02-02: qty 1

## 2022-02-02 MED ORDER — MIDAZOLAM HCL 2 MG/2ML IJ SOLN
INTRAMUSCULAR | Status: DC | PRN
  Administered 2022-02-02: 2 mg via INTRAVENOUS

## 2022-02-02 MED ORDER — KETAMINE HCL 10 MG/ML IJ SOLN
INTRAMUSCULAR | Status: DC | PRN
  Administered 2022-02-02: 30 mg via INTRAVENOUS

## 2022-02-02 MED ORDER — 0.9 % SODIUM CHLORIDE (POUR BTL) OPTIME
TOPICAL | Status: DC | PRN
  Administered 2022-02-02: 500 mL

## 2022-02-02 MED ORDER — OXYCODONE-ACETAMINOPHEN 5-325 MG PO TABS: 1.0000 | ORAL_TABLET | ORAL | 0 refills | Status: DC | PRN

## 2022-02-02 MED ORDER — ONDANSETRON HCL 4 MG/2ML IJ SOLN
INTRAMUSCULAR | Status: DC | PRN
  Administered 2022-02-02: 4 mg via INTRAVENOUS

## 2022-02-02 MED ORDER — MEPERIDINE HCL 25 MG/ML IJ SOLN: 6.2500 mg | INTRAMUSCULAR | Status: DC | PRN

## 2022-02-02 MED ORDER — AMISULPRIDE (ANTIEMETIC) 5 MG/2ML IV SOLN: 10.0000 mg | Freq: Once | INTRAVENOUS | Status: DC | PRN

## 2022-02-02 MED ORDER — IBUPROFEN 200 MG PO TABS: 600.0000 mg | ORAL_TABLET | Freq: Four times a day (QID) | ORAL | 0 refills | Status: DC | PRN

## 2022-02-02 MED ORDER — ACETAMINOPHEN 500 MG PO TABS
1000.0000 mg | ORAL_TABLET | Freq: Once | ORAL | Status: AC
  Administered 2022-02-02: 1000 mg via ORAL

## 2022-02-02 MED ORDER — LIDOCAINE HCL (PF) 2 % IJ SOLN
INTRAMUSCULAR | Status: AC
  Filled 2022-02-02: qty 5

## 2022-02-02 MED ORDER — ROCURONIUM BROMIDE 10 MG/ML (PF) SYRINGE
PREFILLED_SYRINGE | INTRAVENOUS | Status: DC | PRN
  Administered 2022-02-02: 60 mg via INTRAVENOUS

## 2022-02-02 MED ORDER — KETOROLAC TROMETHAMINE 30 MG/ML IJ SOLN
INTRAMUSCULAR | Status: AC
  Filled 2022-02-02: qty 1

## 2022-02-02 MED ORDER — SCOPOLAMINE 1 MG/3DAYS TD PT72
MEDICATED_PATCH | TRANSDERMAL | Status: DC | PRN
  Administered 2022-02-02: 1 via TRANSDERMAL

## 2022-02-02 MED ORDER — MIDAZOLAM HCL 2 MG/2ML IJ SOLN
INTRAMUSCULAR | Status: AC
  Filled 2022-02-02: qty 2

## 2022-02-02 MED ORDER — ARTIFICIAL TEARS OPHTHALMIC OINT
TOPICAL_OINTMENT | OPHTHALMIC | Status: AC
  Filled 2022-02-02: qty 3.5

## 2022-02-02 MED ORDER — PROPOFOL 10 MG/ML IV BOLUS
INTRAVENOUS | Status: DC | PRN
  Administered 2022-02-02: 150 mg via INTRAVENOUS

## 2022-02-02 MED ORDER — LACTATED RINGERS IV SOLN
INTRAVENOUS | Status: DC
  Administered 2022-02-02: 1000 mL via INTRAVENOUS

## 2022-02-02 MED ORDER — FENTANYL CITRATE (PF) 100 MCG/2ML IJ SOLN
INTRAMUSCULAR | Status: DC | PRN
  Administered 2022-02-02: 100 ug via INTRAVENOUS
  Administered 2022-02-02: 50 ug via INTRAVENOUS

## 2022-02-02 MED ORDER — GABAPENTIN 300 MG PO CAPS
ORAL_CAPSULE | ORAL | Status: AC
  Filled 2022-02-02: qty 1

## 2022-02-02 MED ORDER — KETOROLAC TROMETHAMINE 30 MG/ML IJ SOLN
INTRAMUSCULAR | Status: DC | PRN
  Administered 2022-02-02: 30 mg via INTRAVENOUS

## 2022-02-02 MED ORDER — LIDOCAINE 2% (20 MG/ML) 5 ML SYRINGE
INTRAMUSCULAR | Status: DC | PRN
  Administered 2022-02-02: 100 mg via INTRAVENOUS

## 2022-02-02 MED ORDER — SODIUM CHLORIDE 0.9 % IV SOLN
INTRAVENOUS | Status: AC
  Filled 2022-02-02: qty 2

## 2022-02-02 MED ORDER — PHENYLEPHRINE 80 MCG/ML (10ML) SYRINGE FOR IV PUSH (FOR BLOOD PRESSURE SUPPORT)
PREFILLED_SYRINGE | INTRAVENOUS | Status: DC | PRN
  Administered 2022-02-02 (×2): 160 ug via INTRAVENOUS

## 2022-02-02 MED ORDER — DEXAMETHASONE SODIUM PHOSPHATE 10 MG/ML IJ SOLN
INTRAMUSCULAR | Status: DC | PRN
  Administered 2022-02-02: 10 mg via INTRAVENOUS

## 2022-02-02 MED ORDER — BUPIVACAINE HCL (PF) 0.25 % IJ SOLN
INTRAMUSCULAR | Status: DC | PRN
  Administered 2022-02-02: 18 mL

## 2022-02-02 SURGICAL SUPPLY — 40 items
ADH SKN CLS APL DERMABOND .7 (GAUZE/BANDAGES/DRESSINGS) ×1
BLADE CLIPPER SENSICLIP SURGIC (BLADE) IMPLANT
BLADE SURG 15 STRL LF DISP TIS (BLADE) IMPLANT
BLADE SURG 15 STRL SS (BLADE) ×2
CATH ROBINSON RED A/P 16FR (CATHETERS) ×3 IMPLANT
COVER MAYO STAND STRL (DRAPES) ×3 IMPLANT
DERMABOND ADVANCED (GAUZE/BANDAGES/DRESSINGS) ×1
DERMABOND ADVANCED .7 DNX12 (GAUZE/BANDAGES/DRESSINGS) IMPLANT
DRSG COVADERM PLUS 2X2 (GAUZE/BANDAGES/DRESSINGS) ×1 IMPLANT
DRSG OPSITE POSTOP 3X4 (GAUZE/BANDAGES/DRESSINGS) IMPLANT
DURAPREP 26ML APPLICATOR (WOUND CARE) ×3 IMPLANT
ELECT REM PT RETURN 9FT ADLT (ELECTROSURGICAL) ×2
ELECTRODE REM PT RTRN 9FT ADLT (ELECTROSURGICAL) ×2 IMPLANT
GAUZE 4X4 16PLY ~~LOC~~+RFID DBL (SPONGE) ×4 IMPLANT
GLOVE BIO SURGEON STRL SZ7 (GLOVE) ×6 IMPLANT
GOWN STRL REUS W/TWL LRG LVL3 (GOWN DISPOSABLE) ×3 IMPLANT
KIT TURNOVER CYSTO (KITS) ×3 IMPLANT
NEEDLE HYPO 22GX1.5 SAFETY (NEEDLE) ×1 IMPLANT
NEEDLE INSUFFLATION 120MM (ENDOMECHANICALS) ×3 IMPLANT
NS IRRIG 500ML POUR BTL (IV SOLUTION) ×3 IMPLANT
PACK LAPAROSCOPY BASIN (CUSTOM PROCEDURE TRAY) ×3 IMPLANT
PACK TRENDGUARD 450 HYBRID PRO (MISCELLANEOUS) IMPLANT
PAD OB MATERNITY 4.3X12.25 (PERSONAL CARE ITEMS) ×3 IMPLANT
PAD PREP 24X48 CUFFED NSTRL (MISCELLANEOUS) ×3 IMPLANT
SCISSORS LAP 5X35 DISP (ENDOMECHANICALS) IMPLANT
SEALER TISSUE G2 CVD JAW 45CM (ENDOMECHANICALS) ×1 IMPLANT
SET SUCTION IRRIG HYDROSURG (IRRIGATION / IRRIGATOR) IMPLANT
SET TUBE SMOKE EVAC HIGH FLOW (TUBING) ×3 IMPLANT
SHEARS HARMONIC ACE PLUS 45CM (MISCELLANEOUS) IMPLANT
SUT MNCRL AB 4-0 PS2 18 (SUTURE) ×4 IMPLANT
SUT VICRYL 0 UR6 27IN ABS (SUTURE) ×1 IMPLANT
SYR 10ML LL (SYRINGE) ×3 IMPLANT
SYS BAG RETRIEVAL 10MM (BASKET)
SYSTEM BAG RETRIEVAL 10MM (BASKET) IMPLANT
TOWEL OR 17X26 10 PK STRL BLUE (TOWEL DISPOSABLE) ×5 IMPLANT
TRENDGUARD 450 HYBRID PRO PACK (MISCELLANEOUS) ×2
TROCAR XCEL BLUNT TIP 100MML (ENDOMECHANICALS) IMPLANT
TROCAR Z-THREAD BLADED 11X100M (TROCAR) ×2 IMPLANT
TROCAR Z-THREAD BLADED 5X100MM (TROCAR) ×3 IMPLANT
WARMER LAPAROSCOPE (MISCELLANEOUS) ×3 IMPLANT

## 2022-02-02 NOTE — Telephone Encounter (Signed)
Left message with follow-up appointments per 7/7 los. 

## 2022-02-02 NOTE — Discharge Instructions (Signed)
No acetaminophen/Tylenol until after 12:30pm today if needed for pain.   No ibuprofen, Advil, Aleve, Motrin, ketorolac, meloxicam, naproxen, or other NSAIDS until after 2:45pm today if needed for pain.     Post Anesthesia Home Care Instructions  Activity: Get plenty of rest for the remainder of the day. A responsible individual must stay with you for 24 hours following the procedure.  For the next 24 hours, DO NOT: -Drive a car -Paediatric nurse -Drink alcoholic beverages -Take any medication unless instructed by your physician -Make any legal decisions or sign important papers.  Meals: Start with liquid foods such as gelatin or soup. Progress to regular foods as tolerated. Avoid greasy, spicy, heavy foods. If nausea and/or vomiting occur, drink only clear liquids until the nausea and/or vomiting subsides. Call your physician if vomiting continues.  Special Instructions/Symptoms: Your throat may feel dry or sore from the anesthesia or the breathing tube placed in your throat during surgery. If this causes discomfort, gargle with warm salt water. The discomfort should disappear within 24 hours.  If you had a scopolamine patch placed behind your ear for the management of post- operative nausea and/or vomiting:  1. The medication in the patch is effective for 72 hours, after which it should be removed.  Wrap patch in a tissue and discard in the trash. Wash hands thoroughly with soap and water. 2. You may remove the patch earlier than 72 hours if you experience unpleasant side effects which may include dry mouth, dizziness or visual disturbances. 3. Avoid touching the patch. Wash your hands with soap and water after contact with the patch.

## 2022-02-02 NOTE — Transfer of Care (Signed)
Immediate Anesthesia Transfer of Care Note  Patient: Joanna Osborn  Procedure(s) Performed: Procedure(s) (LRB): LAPAROSCOPIC BILATERAL SALPINGO OOPHORECTOMY (Bilateral)  Patient Location: PACU  Anesthesia Type: General  Level of Consciousness:drowsy, follows commands.  Airway & Oxygen Therapy: Patient Spontanous Breathing and Patient connected to face mask oxygen, nasal airway remaining in right nare.  Post-op Assessment: Report given to PACU RN and Post -op Vital signs reviewed and stable  Post vital signs: Reviewed and stable  Complications: No apparent anesthesia complications Last Vitals:  Vitals Value Taken Time  BP 134/80 02/02/22 0915  Temp 37.1 C 02/02/22 0907  Pulse 95 02/02/22 0918  Resp 21 02/02/22 0918  SpO2 96 % 02/02/22 0918  Vitals shown include unvalidated device data.  Last Pain:  Vitals:   02/02/22 0907  TempSrc:   PainSc: Asleep      Patients Stated Pain Goal: 6 (16/83/72 9021)  Complications: No notable events documented.

## 2022-02-02 NOTE — Progress Notes (Signed)
The patient was re-examined with no change in status 

## 2022-02-02 NOTE — Op Note (Signed)
Preoperative diagnosis: History of estrogen positive breast cancer  Postoperative diagnosis: Same  Procedure: Laparoscopic BSO  Surgeon: Matthew Saras  Anesthesia: General  EBL: 10 cc  Procedure and findings:  Patient taken to the operating room after an adequate level of general anesthesia was obtained with the patient's legs in stirrups the abdomen perineum and vagina were prepped and draped after appropriate timeouts were taken.  The bladder was drained, Hulka tenaculum was position.  Attention directed to the abdomen where a 2 cm subumbilical incision was made, the varies needle was introduced without difficulty, its intra-abdominal position was verified by pressure and water testing.  After 2-1/2 L pneumoperitoneum was then created, laparoscopic trocar and sleeve were then introduced that difficulty.  There was no evidence of any bleeding or trauma.  3 fingerbreadths above the symphysis in the midline a 5 mm trocar was inserted under direct visualization and the patient placed in Trendelenburg.  The uterus was enlarged with irregular fibroids but both adnexa could be seen clearly.  Starting on the right atraumatic grasper used to grasp the right ovary placed on traction toward the midline, the course of the ureter was well below, the Enseal device was then used to coagulate and divide the right IP ligament down to and including the right tube and the suspensory ligament of the ovary.  This was all hemostatic.  Tube and ovary were temporarily temporarily placed in the anterior cul-de-sac.  The exact same repeated on the left side, again the course of the ureter well below the operative site.  Once this was accomplished, the lower incision was enlarged slightly at the fascia to allow retrieval of the ovaries readily.  Under direct visualization Kelly clamp was placed first 1 and the other ovary and included tube were removed intact.  Repeat laparoscopy at this point revealed the operative sites to be  hemostatic.  Instruments were removed gas allowed escape the lower incision fascia closed with a running 2-0 Vicryl suture, 2-0 Vicryl on the subcu tissue and 4-0 Monocryl on the subcuticular skin 4-0 Monocryl subcuticular at the umbilical incision and sterile dressing applied.  She tolerated this well went to recovery room in good condition.  Dictated with Dragon [employer] One  Nira Retort MD

## 2022-02-02 NOTE — Anesthesia Procedure Notes (Signed)
Procedure Name: Intubation Date/Time: 02/02/2022 7:42 AM  Performed by: Mechele Claude, CRNAPre-anesthesia Checklist: Patient identified, Emergency Drugs available, Suction available and Patient being monitored Patient Re-evaluated:Patient Re-evaluated prior to induction Oxygen Delivery Method: Circle system utilized Preoxygenation: Pre-oxygenation with 100% oxygen Induction Type: IV induction Ventilation: Mask ventilation without difficulty Laryngoscope Size: Mac and 3 Grade View: Grade II Tube type: Oral Tube size: 7.0 mm Number of attempts: 1 Airway Equipment and Method: Stylet and Oral airway Placement Confirmation: ETT inserted through vocal cords under direct vision, positive ETCO2 and breath sounds checked- equal and bilateral Secured at: 22 cm Tube secured with: Tape Dental Injury: Teeth and Oropharynx as per pre-operative assessment

## 2022-02-02 NOTE — Anesthesia Postprocedure Evaluation (Signed)
Anesthesia Post Note  Patient: Joanna Osborn  Procedure(s) Performed: LAPAROSCOPIC BILATERAL SALPINGO OOPHORECTOMY (Bilateral: Abdomen)     Patient location during evaluation: PACU Anesthesia Type: General Level of consciousness: sedated and patient cooperative Pain management: pain level controlled Vital Signs Assessment: post-procedure vital signs reviewed and stable Respiratory status: spontaneous breathing Cardiovascular status: stable Anesthetic complications: no   No notable events documented.  Last Vitals:  Vitals:   02/02/22 1000 02/02/22 1100  BP: 126/81 124/77  Pulse: 100 99  Resp: 20 16  Temp: 37 C 36.9 C  SpO2: 91% 97%    Last Pain:  Vitals:   02/02/22 1100  TempSrc:   PainSc: 0-No pain                 Nolon Nations

## 2022-02-03 ENCOUNTER — Encounter (HOSPITAL_BASED_OUTPATIENT_CLINIC_OR_DEPARTMENT_OTHER): Payer: Self-pay | Admitting: Obstetrics and Gynecology

## 2022-02-03 LAB — SURGICAL PATHOLOGY

## 2022-02-09 ENCOUNTER — Encounter: Payer: Self-pay | Admitting: *Deleted

## 2022-02-12 ENCOUNTER — Encounter (HOSPITAL_COMMUNITY): Payer: Self-pay | Admitting: Gastroenterology

## 2022-02-13 ENCOUNTER — Ambulatory Visit (HOSPITAL_COMMUNITY)
Admission: RE | Admit: 2022-02-13 | Discharge: 2022-02-13 | Disposition: A | Discharge status: Home / Self Care | Payer: Federal, State, Local not specified - PPO | Source: Ambulatory Visit | Attending: Hematology | Admitting: Hematology

## 2022-02-13 DIAGNOSIS — C50412 Malignant neoplasm of upper-outer quadrant of left female breast: Secondary | ICD-10-CM | POA: Diagnosis not present

## 2022-02-13 DIAGNOSIS — Z170 Estrogen receptor positive status [ER+]: Secondary | ICD-10-CM | POA: Diagnosis not present

## 2022-02-13 DIAGNOSIS — I10 Essential (primary) hypertension: Secondary | ICD-10-CM | POA: Diagnosis not present

## 2022-02-13 DIAGNOSIS — Z01818 Encounter for other preprocedural examination: Secondary | ICD-10-CM | POA: Insufficient documentation

## 2022-02-13 DIAGNOSIS — Z0189 Encounter for other specified special examinations: Secondary | ICD-10-CM | POA: Diagnosis not present

## 2022-02-13 LAB — ECHOCARDIOGRAM COMPLETE
AR max vel: 1.98 cm2
AV Peak grad: 7.5 mmHg
Ao pk vel: 1.37 m/s
Area-P 1/2: 4.57 cm2
Calc EF: 65 %
S' Lateral: 2.6 cm
Single Plane A2C EF: 66.2 %
Single Plane A4C EF: 63.1 %

## 2022-02-16 ENCOUNTER — Other Ambulatory Visit: Payer: Self-pay

## 2022-02-17 ENCOUNTER — Other Ambulatory Visit: Payer: Self-pay

## 2022-02-19 ENCOUNTER — Ambulatory Visit (HOSPITAL_COMMUNITY): Payer: Federal, State, Local not specified - PPO | Admitting: Certified Registered Nurse Anesthetist

## 2022-02-19 ENCOUNTER — Encounter (HOSPITAL_COMMUNITY)
Admission: RE | Disposition: A | Discharge status: Home / Self Care | Payer: Self-pay | Source: Home / Self Care | Attending: Gastroenterology

## 2022-02-19 ENCOUNTER — Other Ambulatory Visit: Payer: Self-pay

## 2022-02-19 ENCOUNTER — Encounter (HOSPITAL_COMMUNITY): Payer: Self-pay | Admitting: Gastroenterology

## 2022-02-19 ENCOUNTER — Ambulatory Visit (HOSPITAL_COMMUNITY)
Admission: RE | Admit: 2022-02-19 | Discharge: 2022-02-19 | Disposition: A | Discharge status: Home / Self Care | Payer: Federal, State, Local not specified - PPO | Attending: Gastroenterology | Admitting: Gastroenterology

## 2022-02-19 DIAGNOSIS — K2970 Gastritis, unspecified, without bleeding: Secondary | ICD-10-CM | POA: Diagnosis not present

## 2022-02-19 DIAGNOSIS — K869 Disease of pancreas, unspecified: Secondary | ICD-10-CM | POA: Diagnosis not present

## 2022-02-19 DIAGNOSIS — I10 Essential (primary) hypertension: Secondary | ICD-10-CM | POA: Diagnosis not present

## 2022-02-19 DIAGNOSIS — K3189 Other diseases of stomach and duodenum: Secondary | ICD-10-CM | POA: Diagnosis not present

## 2022-02-19 DIAGNOSIS — Z6835 Body mass index (BMI) 35.0-35.9, adult: Secondary | ICD-10-CM | POA: Diagnosis not present

## 2022-02-19 DIAGNOSIS — K862 Cyst of pancreas: Secondary | ICD-10-CM | POA: Diagnosis not present

## 2022-02-19 DIAGNOSIS — K449 Diaphragmatic hernia without obstruction or gangrene: Secondary | ICD-10-CM | POA: Diagnosis not present

## 2022-02-19 DIAGNOSIS — K222 Esophageal obstruction: Secondary | ICD-10-CM | POA: Diagnosis not present

## 2022-02-19 DIAGNOSIS — F418 Other specified anxiety disorders: Secondary | ICD-10-CM | POA: Diagnosis not present

## 2022-02-19 DIAGNOSIS — K259 Gastric ulcer, unspecified as acute or chronic, without hemorrhage or perforation: Secondary | ICD-10-CM | POA: Diagnosis not present

## 2022-02-19 HISTORY — PX: FINE NEEDLE ASPIRATION: SHX5430

## 2022-02-19 HISTORY — PX: ESOPHAGOGASTRODUODENOSCOPY (EGD) WITH PROPOFOL: SHX5813

## 2022-02-19 HISTORY — PX: BIOPSY: SHX5522

## 2022-02-19 HISTORY — PX: EUS: SHX5427

## 2022-02-19 SURGERY — UPPER ENDOSCOPIC ULTRASOUND (EUS) RADIAL
Anesthesia: Monitor Anesthesia Care

## 2022-02-19 MED ORDER — OMEPRAZOLE 40 MG PO CPDR: 40.0000 mg | DELAYED_RELEASE_CAPSULE | Freq: Two times a day (BID) | ORAL | 6 refills | Status: DC

## 2022-02-19 MED ORDER — PROPOFOL 10 MG/ML IV BOLUS
INTRAVENOUS | Status: DC | PRN
  Administered 2022-02-19: 80 mg via INTRAVENOUS

## 2022-02-19 MED ORDER — CIPROFLOXACIN HCL 500 MG PO TABS: 500.0000 mg | ORAL_TABLET | Freq: Two times a day (BID) | ORAL | 0 refills | Status: AC

## 2022-02-19 MED ORDER — PROPOFOL 500 MG/50ML IV EMUL
INTRAVENOUS | Status: DC | PRN
  Administered 2022-02-19: 100 ug/kg/min via INTRAVENOUS

## 2022-02-19 MED ORDER — CIPROFLOXACIN IN D5W 400 MG/200ML IV SOLN
INTRAVENOUS | Status: DC | PRN
  Administered 2022-02-19: 400 mg via INTRAVENOUS

## 2022-02-19 MED ORDER — SODIUM CHLORIDE 0.9 % IV SOLN: INTRAVENOUS | Status: DC

## 2022-02-19 MED ORDER — LIDOCAINE 2% (20 MG/ML) 5 ML SYRINGE
INTRAMUSCULAR | Status: DC | PRN
  Administered 2022-02-19: 100 mg via INTRAVENOUS

## 2022-02-19 MED ORDER — CIPROFLOXACIN IN D5W 400 MG/200ML IV SOLN
INTRAVENOUS | Status: AC
  Filled 2022-02-19: qty 200

## 2022-02-19 MED ORDER — GLYCOPYRROLATE 0.2 MG/ML IJ SOLN
INTRAMUSCULAR | Status: DC | PRN
  Administered 2022-02-19: .2 mg via INTRAVENOUS

## 2022-02-19 MED ORDER — LACTATED RINGERS IV SOLN
INTRAVENOUS | Status: AC | PRN
  Administered 2022-02-19: 10 mL/h via INTRAVENOUS

## 2022-02-19 NOTE — Anesthesia Preprocedure Evaluation (Signed)
Anesthesia Evaluation  Patient identified by MRN, date of birth, ID band Patient awake    Reviewed: Allergy & Precautions, NPO status , Patient's Chart, lab work & pertinent test results  History of Anesthesia Complications Negative for: history of anesthetic complications  Airway Mallampati: I  TM Distance: >3 FB Neck ROM: Full    Dental  (+) Teeth Intact, Dental Advisory Given   Pulmonary neg pulmonary ROS,    breath sounds clear to auscultation       Cardiovascular hypertension, Pt. on medications (-) angina Rhythm:Regular Rate:Normal     Neuro/Psych Anxiety Depression Peripheral neuropathy    GI/Hepatic Neg liver ROS, Pancreatic lesion   Endo/Other  Morbid obesity  Renal/GU negative Renal ROS     Musculoskeletal   Abdominal (+) + obese,   Peds  Hematology   Anesthesia Other Findings Breast cancer: chemo, surgery, XRT  Reproductive/Obstetrics                             Anesthesia Physical Anesthesia Plan  ASA: 3  Anesthesia Plan: MAC   Post-op Pain Management: Minimal or no pain anticipated   Induction:   PONV Risk Score and Plan: 2 and Ondansetron and Treatment may vary due to age or medical condition  Airway Management Planned: Natural Airway and Nasal Cannula  Additional Equipment: None  Intra-op Plan:   Post-operative Plan:   Informed Consent: I have reviewed the patients History and Physical, chart, labs and discussed the procedure including the risks, benefits and alternatives for the proposed anesthesia with the patient or authorized representative who has indicated his/her understanding and acceptance.     Dental advisory given  Plan Discussed with: CRNA and Surgeon  Anesthesia Plan Comments:         Anesthesia Quick Evaluation

## 2022-02-19 NOTE — H&P (Signed)
GASTROENTEROLOGY PROCEDURE H&P NOTE   Primary Care Physician: Lennie Odor, PA  HPI: Joanna Osborn is a 51 y.o. 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  Past Medical History:  Diagnosis Date   Anemia    Anxiety    Chemotherapy-induced peripheral neuropathy ([place])    per pt toes   Depression    Family history of colon cancer 03/19/2021   History of chemotherapy    left breast cancer  04-04-2021  to 07-26-2021 and restarted 08-14-2021   History of external beam radiation therapy    left breast cancer  10-02-2021  to 11-18-2021   History of kidney stones    Hypertension    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   Malignant neoplasm of upper-outer quadrant of left breast in female, estrogen receptor positive ([place]) 03/13/2021   oncologist--- dr Burr Medico;  Stage IB, IDC, ER/ PR/ HER2 +;   chemo 04-04-2021  to 07-26-2021 and started 08-14-2021;  raditation completed 11-18-2021   Port-A-Cath in place 04/02/2021   Wears glasses    Past Surgical History:  Procedure Laterality Date   BREAST LUMPECTOMY WITH RADIOACTIVE SEED AND SENTINEL LYMPH NODE BIOPSY Left 08/25/2021   Procedure: LEFT BREAST LUMPECTOMY WITH RADIOACTIVE SEED AND AXILLARY SENTINEL LYMPH NODE BIOPSY;  Surgeon: Rolm Bookbinder, MD;  Location: [place];  Service: General;  Laterality: Left;   CARPAL TUNNEL RELEASE Right 08/17/2008   @[facility]  by dr Fredna Dow;   and excision wrist ganglian cyst   CHOLECYSTECTOMY, LAPAROSCOPIC  06/27/2002   @[facility]  by dr Bubba Camp   HYSTEROSCOPY WITH D & C  11/21/2007   @[facility]  by dr Julien Girt;  polypectomy   INSERTION OF MESH N/A 10/28/2016   Procedure: INSERTION OF MESH;  Surgeon: Clovis [name], MD;  Location: [place];  Service: General;  Laterality: N/A;   LAPAROSCOPIC SALPINGO OOPHERECTOMY Bilateral 02/02/2022   Procedure: LAPAROSCOPIC BILATERAL SALPINGO OOPHORECTOMY;  Surgeon: Molli Posey, MD;  Location: [place];  Service:  Gynecology;  Laterality: Bilateral;   PORTACATH PLACEMENT Right 04/02/2021   Procedure: INSERTION PORT-A-CATH;  Surgeon: Rolm Bookbinder, MD;  Location: [place];  Service: General;  Laterality: Right;   [place] N/A 10/28/2016   Procedure: LAPAROSCOPIC VENTRAL HERNIA REPAIR;  Surgeon: Clovis [name], MD;  Location: [place];  Service: General;  Laterality: N/A;   No current facility-administered medications for this encounter.   No current facility-administered medications for this encounter. Allergies  Allergen Reactions   Endocet [Oxycodone-Acetaminophen] Itching   Tape Rash   Family History  Problem Relation Age of Onset   Kidney disease Mother    Cancer Paternal Aunt        unknown type; dx after 53   Colon cancer Paternal Grandmother        dx unknown age   Colon cancer Paternal Grandfather        dx after 39   Hypertension Other    Cancer Other    Cancer Cousin        unknown type; dx before 42   Social History   Socioeconomic History   Marital status: Married    Spouse name: Not on file   Number of children: 2   Years of education: Not on file   Highest education level: Not on file  Occupational History   Not on file  Tobacco Use   Smoking status: Never   Smokeless tobacco: Never  Vaping  Use   Vaping Use: Never used  Substance and Sexual Activity   Alcohol use: Not Currently    Comment: one glass wine twice monthly   Drug use: Never   Sexual activity: Yes    Birth control/protection: None  Other Topics Concern   Not on file  Social History Narrative   Not on file   Social Determinants of Health   Financial Resource Strain: Not on file  Food Insecurity: No Food Insecurity (03/19/2021)   Hunger Vital Sign    Worried About Running Out of Food in the Last Year: Never true    Ran Out of Food in the Last Year: Never true  Transportation Needs: No Transportation Needs (03/19/2021)   PRAPARE -  Hydrologist (Medical): No    Lack of Transportation (Non-Medical): No  Physical Activity: Not on file  Stress: Not on file  Social Connections: Not on file  Intimate Partner Violence: Not on file    Physical Exam: There were no vitals filed for this visit. There is no height or weight on file to calculate BMI. GEN: NAD EYE: Sclerae anicteric ENT: MMM CV: Non-tachycardic GI: Soft, NT/ND NEURO:  Alert & Oriented x 3  Lab Results: No results for input(s): "WBC", "HGB", "HCT", "PLT" in the last 72 hours. BMET No results for input(s): "NA", "K", "CL", "CO2", "GLUCOSE", "BUN", "CREATININE", "CALCIUM" in the last 72 hours. LFT No results for input(s): "PROT", "ALBUMIN", "AST", "ALT", "ALKPHOS", "BILITOT", "BILIDIR", "IBILI" in the last 72 hours. PT/INR No results for input(s): "LABPROT", "INR" in the last 72 hours.   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  The risks of an EUS including intestinal perforation, bleeding, infection, aspiration, and medication effects were discussed as was the possibility it may not give a definitive diagnosis if a biopsy is performed.  When a biopsy of the pancreas is done as part of the EUS, there is an additional risk of pancreatitis at the rate of about 1-2%.  It was explained that procedure related pancreatitis is typically mild, although it can be severe and even life threatening, which is why we do not perform random pancreatic biopsies and only biopsy a lesion/area we feel is concerning enough to warrant the risk.   The risks and benefits of endoscopic evaluation/treatment were discussed with the patient and/or family; these include but are not limited to the risk of perforation, infection, bleeding, missed lesions, lack of diagnosis, severe illness requiring hospitalization, as well as anesthesia and sedation related illnesses.  The  patient's history has been reviewed, patient examined, no change in status, and deemed stable for procedure.  The patient and/or family is agreeable to proceed.    Justice Britain, MD [facility] Office # [phone]

## 2022-02-19 NOTE — Anesthesia Postprocedure Evaluation (Signed)
Anesthesia Post Note  Patient: Joanna Osborn  Procedure(s) Performed: UPPER ENDOSCOPIC ULTRASOUND (EUS) RADIAL BIOPSY FINE NEEDLE ASPIRATION (FNA) LINEAR     Patient location during evaluation: PACU Anesthesia Type: MAC Level of consciousness: awake and alert, patient cooperative and oriented Pain management: pain level controlled Vital Signs Assessment: post-procedure vital signs reviewed and stable Respiratory status: spontaneous breathing, nonlabored ventilation and respiratory function stable Cardiovascular status: stable and blood pressure returned to baseline Postop Assessment: no apparent nausea or vomiting and able to ambulate Anesthetic complications: no   No notable events documented.  Last Vitals:  Vitals:   02/19/22 0806 02/19/22 1050  BP: (!) 141/100 134/73  Pulse: 80 93  Resp: 14 (!) 21  Temp: 36.6 C 37 C  SpO2: 97% 97%    Last Pain:  Vitals:   02/19/22 1050  TempSrc:   PainSc: 0-No pain                 Jara Feider,E. Shanti Eichel

## 2022-02-19 NOTE — Anesthesia Procedure Notes (Signed)
Procedure Name: MAC Date/Time: 02/19/2022 9:46 AM  Performed by: Lowella Dell, CRNAPre-anesthesia Checklist: Patient identified, Emergency Drugs available, Suction available, Patient being monitored and Timeout performed Patient Re-evaluated:Patient Re-evaluated prior to induction Oxygen Delivery Method: Nasal cannula Placement Confirmation: positive ETCO2 Dental Injury: Teeth and Oropharynx as per pre-operative assessment

## 2022-02-19 NOTE — Op Note (Signed)
[facility] Patient Name: Joanna Osborn Procedure Date : 02/19/2022 MRN: [id number] Attending MD: Justice Britain , MD Date of Birth: 03/11/1971 CSN: [id number] Age: 51 Admit Type: Outpatient Procedure:                Upper EUS Indications:              Pancreatic cyst on CT scan, Pancreatic cyst on MRCP Providers:                Justice Britain, MD, Ervin Knack, RN, [facility] Technician, Technician Referring MD:              Medicines:                Monitored Anesthesia Care, Cipro 992 mg IV Complications:            No immediate complications. Estimated Blood Loss:     Estimated blood loss was minimal. Procedure:                Pre-Anesthesia Assessment:                           - Prior to the procedure, a History and Physical                            was performed, and patient medications and                            allergies were reviewed. The patient's tolerance of                            previous anesthesia was also reviewed. The risks                            and benefits of the procedure and the sedation                            options and risks were discussed with the patient.                            All questions were answered, and informed consent                            was obtained. Prior Anticoagulants: The patient has                            taken no previous anticoagulant or antiplatelet                            agents. ASA Grade Assessment: II - A patient with                            mild systemic disease. After reviewing the risks  and benefits, the patient was deemed in                            satisfactory condition to undergo the procedure.                           After obtaining informed consent, the endoscope was                            passed under direct vision. Throughout the                            procedure, the patient's blood pressure, pulse,  and                            oxygen saturations were monitored continuously. The                            GIF-H190 ([id number]) Olympus gastroscope was                            introduced through the mouth, and advanced to the                            second part of duodenum. The [id number]-Q190V ([id number])                            Olympus duodenoscope was introduced through the                            mouth, and advanced to the area of papilla. The                            [id number] ([id number]) Olympus linear ultrasound scope                            was introduced through the mouth, and advanced to                            the duodenum for ultrasound examination from the                            stomach and duodenum. The upper EUS was                            accomplished without difficulty. The patient                            tolerated the procedure. Scope In: Scope Out: Findings:      ENDOSCOPIC FINDING: :      No gross lesions were noted in the entire esophagus.      A non-obstructing Schatzki ring was found at the gastroesophageal       junction.      The Z-line was regular and  was found 36 cm from the incisors.      A 3 cm hiatal hernia was present.      One non-bleeding linear gastric ulcer with a clean ulcer base (Forrest       Class III) was found in the gastric antrum. The lesion was 10 mm in       largest dimension.      Segmental moderate inflammation characterized by erosions, erythema and       granularity was found in the gastric antrum and in the prepyloric region       of the stomach.      No gross lesions were noted in the entire examined stomach. Biopsies       were taken with a cold forceps for histology and Helicobacter pylori       testing.      Patchy mildly erythematous mucosa without active bleeding was found in       the duodenal bulb, in the first portion of the duodenum and in the       second portion of the duodenum. Biopsies were taken with  a cold forceps       for histology.      The major papilla was normal and hidden under a hood - no evidence of       Fish mout deformity or mucin secretion.      ENDOSONOGRAPHIC FINDING: :      A rounded lesion was identified in the pancreatic head. The lesion was       mixed solid and cystic. It measured 20 mm by 24 mm in maximal       cross-sectional diameter. The outer margins were smooth. An intact       interface was seen between the lesion and the superior mesenteric       artery, celiac trunk and portal vein suggesting a lack of invasion. The       remainder of the pancreas was examined. The endosonographic appearance       of parenchyma was normal and the upstream pancreatic duct indicated no       duct dilation (PDH - 2.1 mm, PDN - 2.0 mm, PDB - 1.7 mm, PDT - 1.4 mm),       no ductal or parenchymal calcifications and no parenchymal atrophy. Fine       needle biopsy was performed. Color Doppler imaging was utilized prior to       needle puncture to confirm a lack of significant vascular structures       within the needle path. Five passes were made with the Expect X 22 gauge       ultrasound core biopsy needle using a transduodenal approach. A visible       core of tissue was obtained. Preliminary cytologic examination and touch       preps were performed. Final cytology results are pending.      There was no sign of significant endosonographic abnormality in the       common bile duct (3.4 mm) and in the common hepatic duct (5.0 mm). Ducts       of normal caliber were identified.      Endosonographic imaging of the ampulla showed no intramural       (subepithelial) lesion.      Endosonographic imaging in the visualized portion of the liver showed no       mass.      No malignant-appearing lymph nodes were visualized  in the celiac region       (level 20), peripancreatic region and porta hepatis region.      The celiac region was visualized. Impression:               EGD  Impression:                           - No gross lesions in esophagus. Non-obstructing                            Schatzki ring. Z-line regular, 36 cm from the                            incisors.                           - 3 cm hiatal hernia.                           - Non-bleeding gastric ulcer with a clean ulcer                            base (Forrest Class III) in antrum. Gastritis in                            antrum. No other gross lesions in the stomach.                            Biopsied.                           - Erythematous duodenopathy. Biopsied.                           - Normal major papilla hidden under a hood.                           EUS Impression:                           - A lesion that was mixed solid-cystic was                            identified in the pancreatic head. Cytology results                            are pending. However, the endosonographic                            appearance is suspicious for a serous cystadenoma                            vs solid pseudopapillary neoplasm. Fine needle                            biopsy performed with results pending. The concern  for IPMN seems much less likely at this point in                            time.                           - There was no sign of significant pathology in the                            common bile duct and in the common hepatic duct.                           - No malignant-appearing lymph nodes were                            visualized in the celiac region (level 20),                            peripancreatic region and porta hepatis region. Recommendation:           - The patient will be observed post-procedure,                            until all discharge criteria are met.                           - Discharge patient to home.                           - Patient has a contact number available for                            emergencies. The signs  and symptoms of potential                            delayed complications were discussed with the                            patient. Return to normal activities tomorrow.                            Written discharge instructions were provided to the                            patient.                           - Low fat diet for 1 week.                           - Await cytology results and await path results.                           - Start Omeprazole 40 mg twice dialy.                           -  Ciprofloxacin 500 mg twice daily for 3-days to                            decrease post-infectious complications.                           - Minimize NSAIDs as able.                           - Observe patient's clinical course.                           - Repeat EGD in 2-month to ensure healing of                            gastric ulcer and to hopefully start decreasing PPI                            therapy.                           - Surveillance/follow of pancreatic lesion after                            [facility]ology [facility]                           - The findings and recommendations were discussed                            with the patient.                           - The findings and recommendations were discussed                            with the patient's family. Procedure Code(s):        --- Professional ---                           [id number] Esophagogastroduodenoscopy, flexible,                            transoral; with transendoscopic ultrasound-guided                            intramural or transmural fine needle                            aspiration/biopsy(s), (includes endoscopic                            ultrasound examination limited to the esophagus,                            stomach or duodenum, and adjacent structures) Diagnosis Code(s):        ---  Professional ---                           K22.2, Esophageal obstruction                            K44.9, Diaphragmatic hernia without obstruction or                            gangrene                           K25.9, Gastric ulcer, unspecified as acute or                            chronic, without hemorrhage or perforation                           K29.70, Gastritis, unspecified, without bleeding                           K31.89, Other diseases of stomach and duodenum                           K86.89, Other specified diseases of pancreas                           I89.9, Noninfective disorder of lymphatic vessels                            and lymph nodes, unspecified                           K86.2, Cyst of pancreas CPT copyright 2019 [employer]. All rights reserved. The codes documented in this report are preliminary and upon coder review may  be revised to meet current compliance requirements. Justice Britain, MD 02/19/2022 11:04:34 AM Number of Addenda: 0

## 2022-02-19 NOTE — Transfer of Care (Signed)
Immediate Anesthesia Transfer of Care Note  Patient: Joanna Osborn  Procedure(s) Performed: UPPER ENDOSCOPIC ULTRASOUND (EUS) RADIAL BIOPSY FINE NEEDLE ASPIRATION (FNA) LINEAR  Patient Location: PACU  Anesthesia Type:MAC  Level of Consciousness: awake and patient cooperative  Airway & Oxygen Therapy: Patient Spontanous Breathing and Patient connected to nasal cannula oxygen  Post-op Assessment: Report given to RN and Post -op Vital signs reviewed and stable  Post vital signs: Reviewed and stable  Last Vitals:  Vitals Value Taken Time  BP 134/73 02/19/22 1048  Temp 37 C 02/19/22 1050  Pulse 87 02/19/22 1056  Resp 18 02/19/22 1056  SpO2 97 % 02/19/22 1056  Vitals shown include unvalidated device data.  Last Pain:  Vitals:   02/19/22 1050  TempSrc:   PainSc: 0-No pain         Complications: No notable events documented.

## 2022-02-20 ENCOUNTER — Encounter (HOSPITAL_COMMUNITY): Payer: Self-pay | Admitting: Gastroenterology

## 2022-02-20 ENCOUNTER — Inpatient Hospital Stay: Payer: Federal, State, Local not specified - PPO

## 2022-02-20 VITALS — BP 134/92 | HR 89 | Temp 98.5°F | Resp 18

## 2022-02-20 DIAGNOSIS — C50412 Malignant neoplasm of upper-outer quadrant of left female breast: Secondary | ICD-10-CM

## 2022-02-20 DIAGNOSIS — Z95828 Presence of other vascular implants and grafts: Secondary | ICD-10-CM

## 2022-02-20 DIAGNOSIS — Z5112 Encounter for antineoplastic immunotherapy: Secondary | ICD-10-CM | POA: Diagnosis not present

## 2022-02-20 DIAGNOSIS — Z170 Estrogen receptor positive status [ER+]: Secondary | ICD-10-CM | POA: Diagnosis not present

## 2022-02-20 DIAGNOSIS — Z79899 Other long term (current) drug therapy: Secondary | ICD-10-CM | POA: Diagnosis not present

## 2022-02-20 LAB — SURGICAL PATHOLOGY

## 2022-02-20 LAB — CYTOLOGY - NON PAP

## 2022-02-20 MED ORDER — SODIUM CHLORIDE 0.9% FLUSH
10.0000 mL | Freq: Once | INTRAVENOUS | Status: AC
  Administered 2022-02-20: 10 mL

## 2022-02-20 MED ORDER — TRASTUZUMAB-DKST CHEMO 150 MG IV SOLR
6.0000 mg/kg | Freq: Once | INTRAVENOUS | Status: AC
  Administered 2022-02-20: 567 mg via INTRAVENOUS
  Filled 2022-02-20: qty 27

## 2022-02-20 MED ORDER — SODIUM CHLORIDE 0.9 % IV SOLN: Freq: Once | INTRAVENOUS | Status: AC

## 2022-02-20 MED ORDER — ACETAMINOPHEN 325 MG PO TABS
650.0000 mg | ORAL_TABLET | Freq: Once | ORAL | Status: AC
  Administered 2022-02-20: 650 mg via ORAL
  Filled 2022-02-20: qty 2

## 2022-02-20 MED ORDER — DIPHENHYDRAMINE HCL 25 MG PO CAPS
50.0000 mg | ORAL_CAPSULE | Freq: Once | ORAL | Status: AC
  Administered 2022-02-20: 50 mg via ORAL
  Filled 2022-02-20: qty 2

## 2022-02-20 MED ORDER — HEPARIN SOD (PORK) LOCK FLUSH 100 UNIT/ML IV SOLN
500.0000 [IU] | Freq: Once | INTRAVENOUS | Status: AC
  Administered 2022-02-20: 500 [IU]

## 2022-02-20 NOTE — Patient Instructions (Signed)
[facility]  Discharge Instructions: Thank you for choosing [facility] to provide your oncology and hematology care.   If you have a lab appointment with the [facility], please go directly to the [facility] and check in at the registration area.   Wear comfortable clothing and clothing appropriate for easy access to any Portacath or PICC line.   We strive to give you quality time with your provider. You may need to reschedule your appointment if you arrive late (15 or more minutes).  Arriving late affects you and other patients whose appointments are after yours.  Also, if you miss three or more appointments without notifying the office, you may be dismissed from the clinic at the provider's discretion.      For prescription refill requests, have your pharmacy contact our office and allow 72 hours for refills to be completed.    Today you received the following chemotherapy and/or immunotherapy agents: trastuzumab-dkst Madalyn Rob)      To help prevent nausea and vomiting after your treatment, we encourage you to take your nausea medication as directed.  BELOW ARE SYMPTOMS THAT SHOULD BE REPORTED IMMEDIATELY: *FEVER GREATER THAN 100.4 F (38 C) OR HIGHER *CHILLS OR SWEATING *NAUSEA AND VOMITING THAT IS NOT CONTROLLED WITH YOUR NAUSEA MEDICATION *UNUSUAL SHORTNESS OF BREATH *UNUSUAL BRUISING OR BLEEDING *URINARY PROBLEMS (pain or burning when urinating, or frequent urination) *BOWEL PROBLEMS (unusual diarrhea, constipation, pain near the anus) TENDERNESS IN MOUTH AND THROAT WITH OR WITHOUT PRESENCE OF ULCERS (sore throat, sores in mouth, or a toothache) UNUSUAL RASH, SWELLING OR PAIN  UNUSUAL VAGINAL DISCHARGE OR ITCHING   Items with * indicate a potential emergency and should be followed up as soon as possible or go to the Emergency Department if any problems should occur.  Please show the CHEMOTHERAPY ALERT CARD or IMMUNOTHERAPY ALERT CARD  at check-in to the Emergency Department and triage nurse.  Should you have questions after your visit or need to cancel or reschedule your appointment, please contact [facility]  Dept: [phone]  and follow the prompts.  Office hours are 8:00 a.m. to 4:30 p.m. Monday - Friday. Please note that voicemails left after 4:00 p.m. may not be returned until the following business day.  We are closed weekends and major holidays. You have access to a nurse at all times for urgent questions. Please call the main number to the clinic Dept: [phone] and follow the prompts.   For any non-urgent questions, you may also contact your provider using MyChart. We now offer e-Visits for anyone 66 and older to request care online for non-urgent symptoms. For details visit [email or web address].   Also download the MyChart app! Go to the app store, search "MyChart", open the app, select [facility], and log in with your MyChart username and password.  Masks are optional in the cancer centers. If you would like for your care team to wear a mask while they are taking care of you, please let them know. For doctor visits, patients may have with them one support person who is at least 50 years old. At this time, visitors are not allowed in the infusion area.

## 2022-02-21 ENCOUNTER — Encounter: Payer: Self-pay | Admitting: Gastroenterology

## 2022-02-23 ENCOUNTER — Telehealth: Payer: Self-pay | Admitting: Gastroenterology

## 2022-02-23 NOTE — Telephone Encounter (Signed)
See results note dated 7/31

## 2022-02-23 NOTE — Telephone Encounter (Signed)
Patient called stating that since she had her pancreatic biopsy with Dr. Rush Landmark she has been experiencing severe pain under her breast area along with nausea and diarrhea.  She would like to know what she should do.  Please call and advise.  Thank you.

## 2022-02-24 ENCOUNTER — Other Ambulatory Visit (INDEPENDENT_AMBULATORY_CARE_PROVIDER_SITE_OTHER): Payer: Federal, State, Local not specified - PPO

## 2022-02-24 ENCOUNTER — Other Ambulatory Visit: Payer: Self-pay

## 2022-02-24 ENCOUNTER — Ambulatory Visit (INDEPENDENT_AMBULATORY_CARE_PROVIDER_SITE_OTHER)
Admission: RE | Admit: 2022-02-24 | Discharge: 2022-02-24 | Disposition: A | Discharge status: Home / Self Care | Payer: Federal, State, Local not specified - PPO | Source: Ambulatory Visit | Attending: Gastroenterology | Admitting: Gastroenterology

## 2022-02-24 DIAGNOSIS — R109 Unspecified abdominal pain: Secondary | ICD-10-CM

## 2022-02-24 DIAGNOSIS — R1011 Right upper quadrant pain: Secondary | ICD-10-CM | POA: Diagnosis not present

## 2022-02-24 LAB — CBC WITH DIFFERENTIAL/PLATELET
Basophils Absolute: 0 10*3/uL (ref 0.0–0.1)
Basophils Relative: 0.5 % (ref 0.0–3.0)
Eosinophils Absolute: 0.1 10*3/uL (ref 0.0–0.7)
Eosinophils Relative: 1.6 % (ref 0.0–5.0)
HCT: 36.4 % (ref 36.0–46.0)
Hemoglobin: 12 g/dL (ref 12.0–15.0)
Lymphocytes Relative: 28.1 % (ref 12.0–46.0)
Lymphs Abs: 1.5 10*3/uL (ref 0.7–4.0)
MCHC: 32.9 g/dL (ref 30.0–36.0)
MCV: 87.3 fl (ref 78.0–100.0)
Monocytes Absolute: 0.3 10*3/uL (ref 0.1–1.0)
Monocytes Relative: 5.7 % (ref 3.0–12.0)
Neutro Abs: 3.4 10*3/uL (ref 1.4–7.7)
Neutrophils Relative %: 64.1 % (ref 43.0–77.0)
Platelets: 366 10*3/uL (ref 150.0–400.0)
RBC: 4.17 Mil/uL (ref 3.87–5.11)
RDW: 14.9 % (ref 11.5–15.5)
WBC: 5.3 10*3/uL (ref 4.0–10.5)

## 2022-02-24 LAB — COMPREHENSIVE METABOLIC PANEL
ALT: 15 U/L (ref 0–35)
AST: 15 U/L (ref 0–37)
Albumin: 4.5 g/dL (ref 3.5–5.2)
Alkaline Phosphatase: 72 U/L (ref 39–117)
BUN: 18 mg/dL (ref 6–23)
CO2: 25 mEq/L (ref 19–32)
Calcium: 10.2 mg/dL (ref 8.4–10.5)
Chloride: 101 mEq/L (ref 96–112)
Creatinine, Ser: 0.82 mg/dL (ref 0.40–1.20)
GFR: 83.77 mL/min (ref >60.00)
Glucose, Bld: 117 mg/dL — ABNORMAL HIGH (ref 70–99)
Potassium: 3.7 mEq/L (ref 3.5–5.1)
Sodium: 138 mEq/L (ref 135–145)
Total Bilirubin: 0.8 mg/dL (ref 0.2–1.2)
Total Protein: 7.8 g/dL (ref 6.0–8.3)

## 2022-02-24 LAB — AMYLASE: Amylase: 30 U/L (ref 27–131)

## 2022-02-24 LAB — LIPASE: Lipase: 11 U/L (ref 11.0–59.0)

## 2022-02-26 ENCOUNTER — Telehealth: Payer: Self-pay | Admitting: Hematology

## 2022-02-26 NOTE — Telephone Encounter (Signed)
Per 8/3 phone line pt called to r/s appointment  r/s per pt request

## 2022-03-05 ENCOUNTER — Other Ambulatory Visit: Payer: Self-pay | Admitting: Hematology

## 2022-03-12 ENCOUNTER — Inpatient Hospital Stay: Payer: Federal, State, Local not specified - PPO | Attending: Hematology

## 2022-03-12 ENCOUNTER — Other Ambulatory Visit: Payer: Self-pay

## 2022-03-12 ENCOUNTER — Inpatient Hospital Stay: Payer: Federal, State, Local not specified - PPO

## 2022-03-12 ENCOUNTER — Encounter: Payer: Self-pay | Admitting: *Deleted

## 2022-03-12 VITALS — BP 146/98 | HR 80 | Temp 98.6°F | Resp 17 | Wt 218.2 lb

## 2022-03-12 DIAGNOSIS — Z95828 Presence of other vascular implants and grafts: Secondary | ICD-10-CM

## 2022-03-12 DIAGNOSIS — C50412 Malignant neoplasm of upper-outer quadrant of left female breast: Secondary | ICD-10-CM | POA: Diagnosis not present

## 2022-03-12 DIAGNOSIS — Z170 Estrogen receptor positive status [ER+]: Secondary | ICD-10-CM | POA: Insufficient documentation

## 2022-03-12 DIAGNOSIS — Z5112 Encounter for antineoplastic immunotherapy: Secondary | ICD-10-CM | POA: Diagnosis not present

## 2022-03-12 LAB — CMP (CANCER CENTER ONLY)
ALT: 17 U/L (ref 0–44)
AST: 18 U/L (ref 15–41)
Albumin: 4 g/dL (ref 3.5–5.0)
Alkaline Phosphatase: 65 U/L (ref 38–126)
Anion gap: 7 (ref 5–15)
BUN: 19 mg/dL (ref 6–20)
CO2: 25 mmol/L (ref 22–32)
Calcium: 9.7 mg/dL (ref 8.9–10.3)
Chloride: 110 mmol/L (ref 98–111)
Creatinine: 0.81 mg/dL (ref 0.44–1.00)
GFR, Estimated: >60 mL/min (ref >60)
Glucose, Bld: 95 mg/dL (ref 70–99)
Potassium: 3.5 mmol/L (ref 3.5–5.1)
Sodium: 142 mmol/L (ref 135–145)
Total Bilirubin: 0.7 mg/dL (ref 0.3–1.2)
Total Protein: 7.2 g/dL (ref 6.5–8.1)

## 2022-03-12 LAB — SAMPLE TO BLOOD BANK

## 2022-03-12 LAB — CBC WITH DIFFERENTIAL (CANCER CENTER ONLY)
Abs Immature Granulocytes: 0.01 10*3/uL (ref 0.00–0.07)
Basophils Absolute: 0 10*3/uL (ref 0.0–0.1)
Basophils Relative: 0 %
Eosinophils Absolute: 0.1 10*3/uL (ref 0.0–0.5)
Eosinophils Relative: 3 %
HCT: 31.9 % — ABNORMAL LOW (ref 36.0–46.0)
Hemoglobin: 10.6 g/dL — ABNORMAL LOW (ref 12.0–15.0)
Immature Granulocytes: 0 %
Lymphocytes Relative: 34 %
Lymphs Abs: 1.7 10*3/uL (ref 0.7–4.0)
MCH: 29.3 pg (ref 26.0–34.0)
MCHC: 33.2 g/dL (ref 30.0–36.0)
MCV: 88.1 fL (ref 80.0–100.0)
Monocytes Absolute: 0.3 10*3/uL (ref 0.1–1.0)
Monocytes Relative: 5 %
Neutro Abs: 2.9 10*3/uL (ref 1.7–7.7)
Neutrophils Relative %: 58 %
Platelet Count: 300 10*3/uL (ref 150–400)
RBC: 3.62 MIL/uL — ABNORMAL LOW (ref 3.87–5.11)
RDW: 13.4 % (ref 11.5–15.5)
WBC Count: 4.9 10*3/uL (ref 4.0–10.5)
nRBC: 0 % (ref 0.0–0.2)

## 2022-03-12 MED ORDER — TRASTUZUMAB-DKST CHEMO 150 MG IV SOLR
6.0000 mg/kg | Freq: Once | INTRAVENOUS | Status: AC
  Administered 2022-03-12: 567 mg via INTRAVENOUS
  Filled 2022-03-12: qty 27

## 2022-03-12 MED ORDER — SODIUM CHLORIDE 0.9 % IV SOLN: INTRAVENOUS | Status: DC

## 2022-03-12 MED ORDER — DIPHENHYDRAMINE HCL 25 MG PO CAPS
50.0000 mg | ORAL_CAPSULE | Freq: Once | ORAL | Status: AC
  Administered 2022-03-12: 50 mg via ORAL
  Filled 2022-03-12: qty 2

## 2022-03-12 MED ORDER — SODIUM CHLORIDE 0.9% FLUSH
10.0000 mL | INTRAVENOUS | Status: DC | PRN
  Administered 2022-03-12: 10 mL via INTRAVENOUS

## 2022-03-12 MED ORDER — HEPARIN SOD (PORK) LOCK FLUSH 100 UNIT/ML IV SOLN
500.0000 [IU] | Freq: Once | INTRAVENOUS | Status: AC
  Administered 2022-03-12: 500 [IU] via INTRAVENOUS

## 2022-03-12 MED ORDER — ACETAMINOPHEN 325 MG PO TABS
650.0000 mg | ORAL_TABLET | Freq: Once | ORAL | Status: AC
  Administered 2022-03-12: 650 mg via ORAL
  Filled 2022-03-12: qty 2

## 2022-03-12 MED ORDER — SODIUM CHLORIDE 0.9% FLUSH
10.0000 mL | Freq: Once | INTRAVENOUS | Status: AC
  Administered 2022-03-12: 10 mL

## 2022-03-13 ENCOUNTER — Other Ambulatory Visit: Payer: Federal, State, Local not specified - PPO

## 2022-03-13 ENCOUNTER — Ambulatory Visit: Payer: Federal, State, Local not specified - PPO

## 2022-03-23 ENCOUNTER — Ambulatory Visit: Payer: Federal, State, Local not specified - PPO | Attending: General Surgery

## 2022-03-23 VITALS — Wt 220.1 lb

## 2022-03-23 DIAGNOSIS — Z483 Aftercare following surgery for neoplasm: Secondary | ICD-10-CM | POA: Insufficient documentation

## 2022-03-23 DIAGNOSIS — Z853 Personal history of malignant neoplasm of breast: Secondary | ICD-10-CM | POA: Diagnosis not present

## 2022-03-23 DIAGNOSIS — I10 Essential (primary) hypertension: Secondary | ICD-10-CM | POA: Diagnosis not present

## 2022-03-23 NOTE — Therapy (Addendum)
OUTPATIENT PHYSICAL THERAPY SOZO SCREENING NOTE   Patient Name: Joanna Osborn MRN: [id number] DOB:[birth year], 51 y.o., female Today's Date: 03/23/2022  PCP: Lennie Odor, PA REFERRING PROVIDER: Rolm Bookbinder, MD   PT End of Session - 03/23/22 1620     Visit Number 7   # unchanged due to screen only   PT Start Time 1618    PT Stop Time 1625    PT Time Calculation (min) 7 min    Activity Tolerance Patient tolerated treatment well    Behavior During Therapy [facility] for tasks assessed/performed             Past Medical History:  Diagnosis Date   Anemia    Anxiety    Chemotherapy-induced peripheral neuropathy ([place])    per pt toes   Depression    Family history of colon cancer 03/19/2021   History of chemotherapy    left breast cancer  04-04-2021  to 07-26-2021 and restarted 08-14-2021   History of external beam radiation therapy    left breast cancer  10-02-2021  to 11-18-2021   History of kidney stones    Hypertension    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   Malignant neoplasm of upper-outer quadrant of left breast in female, estrogen receptor positive ([place]) 03/13/2021   oncologist--- dr Burr Medico;  Stage IB, IDC, ER/ PR/ HER2 +;   chemo 04-04-2021  to 07-26-2021 and started 08-14-2021;  raditation completed 11-18-2021   Port-A-Cath in place 04/02/2021   Wears glasses    Past Surgical History:  Procedure Laterality Date   BIOPSY  02/19/2022   Procedure: BIOPSY;  Surgeon: Rush Landmark Telford Nab., MD;  Location: [facility] ENDOSCOPY;  Service: Gastroenterology;;   BREAST LUMPECTOMY WITH RADIOACTIVE SEED AND SENTINEL LYMPH NODE BIOPSY Left 08/25/2021   Procedure: LEFT BREAST LUMPECTOMY WITH RADIOACTIVE SEED AND AXILLARY SENTINEL LYMPH NODE BIOPSY;  Surgeon: Rolm Bookbinder, MD;  Location: [facility];  Service: General;  Laterality: Left;   CARPAL TUNNEL RELEASE Right 08/17/2008   _0  by dr Fredna Dow;   and excision wrist ganglian cyst   CHOLECYSTECTOMY, LAPAROSCOPIC   06/27/2002   _1  by dr Bubba Camp   ESOPHAGOGASTRODUODENOSCOPY (EGD) WITH PROPOFOL N/A 02/19/2022   Procedure: ESOPHAGOGASTRODUODENOSCOPY (EGD) WITH PROPOFOL;  Surgeon: Irving Copas., MD;  Location: [facility];  Service: Gastroenterology;  Laterality: N/A;   EUS N/A 02/19/2022   Procedure: UPPER ENDOSCOPIC ULTRASOUND (EUS) RADIAL;  Surgeon: Irving Copas., MD;  Location: [facility];  Service: Gastroenterology;  Laterality: N/A;   FINE NEEDLE ASPIRATION  02/19/2022   Procedure: FINE NEEDLE ASPIRATION (FNA) LINEAR;  Surgeon: Irving Copas., MD;  Location: [facility];  Service: Gastroenterology;;   HYSTEROSCOPY WITH D & C  11/21/2007   _2  by dr Julien Girt;  polypectomy   INSERTION OF MESH N/A 10/28/2016   Procedure: INSERTION OF MESH;  Surgeon: Clovis [name], MD;  Location: [facility];  Service: General;  Laterality: N/A;   LAPAROSCOPIC SALPINGO OOPHERECTOMY Bilateral 02/02/2022   Procedure: LAPAROSCOPIC BILATERAL SALPINGO OOPHORECTOMY;  Surgeon: Molli Posey, MD;  Location[facility];  Service: Gynecology;  Laterality: Bilateral;   PORTACATH PLACEMENT Right 04/02/2021   Procedure: INSERTION PORT-A-CATH;  Surgeon: Rolm Bookbinder, MD;  Location: [facility];  Service: General;  Laterality: Right;   [place] N/A 10/28/2016   Procedure: LAPAROSCOPIC VENTRAL HERNIA REPAIR;  Surgeon: Clovis [name], MD;  Location: [facility];  Service: General;  Laterality: N/A;   Patient Active  Problem List   Diagnosis Date Noted   Left breast abscess 09/12/2021   Essential hypertension 09/12/2021   Lesion of pancreas 09/12/2021   Hypokalemia 09/12/2021   Genetic testing 04/08/2021   Port-A-Cath in place 04/03/2021   Family history of colon cancer 03/19/2021   Malignant neoplasm of upper-outer quadrant of left breast in female, estrogen receptor positive ([place]) 03/13/2021   Suicide ideation 07/07/2013   Major  depressive disorder, recurrent severe without psychotic features ([place]) 07/06/2013    REFERRING DIAG: left breast cancer at risk for lymphedema  THERAPY DIAG: Aftercare following surgery for neoplasm  PERTINENT HISTORY: Patient was diagnosed on 03/05/2021 with left grade III invasive ductal carcinoma breast cancer. She underwent neoadjuvant chemotherapy from 04/03/2021-07/24/2021. She had a left lumpectomy and sentinel node biopsy (2 negative nodes) on 08/25/2021. It is triple positive with a Ki67 of 40%.   PRECAUTIONS: left UE Lymphedema risk, None  SUBJECTIVE: Pt returns for her 3 month L-Dex screen.   PAIN:  Are you having pain? No  SOZO SCREENING:   Patient was assessed today using the SOZO machine to determine the lymphedema index score. This was compared to her baseline score. It was determined that she is NOT within the recommended range when compared to her baseline. She has a compression sleeve and gauntlet so will begin wearing this daily, 10 hrs/day for next 28 days. It is recommended she return in 1 month to be reassessed. If she continues to measure outside the recommended range, physical therapy treatment will be recommended at that time and a referral requested. Pt was also advised to be mindful of potential finger swelling since her change was so elevated and she only has a gauntlet. If she started to see her fingers increase she needed to stop wearing her compression garments and try to resume treatment with [place] ASAP. Pt was also advised to incorporate end AA/ROM stretching for her Lt shoulder as she reports she has started to notice a pulling into her upper arm. Educated her that this may be cording but wear of the compression sleeve may help to reduce this.    L-DEX FLOWSHEETS - 03/23/22 1600       L-DEX LYMPHEDEMA SCREENING   Measurement Type Unilateral    L-DEX MEASUREMENT EXTREMITY Upper Extremity    POSITION  Standing    DOMINANT SIDE Right    At Risk Side Left     BASELINE SCORE (UNILATERAL) -2.7    L-DEX SCORE (UNILATERAL) 12.4    VALUE CHANGE (UNILAT) 15.1              Otelia Limes, PTA 03/23/2022, 4:28 PM

## 2022-04-08 ENCOUNTER — Other Ambulatory Visit: Payer: Self-pay

## 2022-04-08 NOTE — Progress Notes (Signed)
The proposed treatment discussed in conference is for discussion purpose only and is not a binding recommendation.  The patients have not been physically examined, or presented with their treatment options.  Therefore, final treatment plans cannot be decided.  

## 2022-04-17 ENCOUNTER — Encounter: Payer: Self-pay | Admitting: Gastroenterology

## 2022-04-17 NOTE — Progress Notes (Signed)
This is for documentation purposes in the chart. This patient's case was discussed at [facility] recently. Radiology and oncology and hepatobiliary surgery and GI were involved. 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 Though unlikely, a neuroendocrine tumor could have this particular finding as well. Thus, the plan will be for follow-up imaging and chromogranin a level monitoring and urine 5-HIAA. The MRI/MRCP would be the next best imaging study and that can be done at a 22-monthinterval. Chromogranin A can be obtained at any time point as well as a urine 5-HIAA. If there becomes concern that imaging and/or analysis is concerning for potential neuroendocrine tumor, repeat endoscopic ultrasound can certainly be considered. Dr. FBurr Medico the patient's primary oncologist will be seeing the patient for cancer follow-up soon.  She will discuss these recommendations  I will be happy to be available for the patient in the future.   She looks to potentially be overdue for colon cancer screening unless she has had that done elsewhere. When patient follows up with oncology, if colonoscopy is due we can have a message sent to me and I will work on arranging that.   GJustice Britain MD [facility] Office # [phone]

## 2022-04-19 ENCOUNTER — Other Ambulatory Visit: Payer: Self-pay | Admitting: Hematology

## 2022-04-27 ENCOUNTER — Ambulatory Visit: Payer: Federal, State, Local not specified - PPO | Attending: General Surgery

## 2022-04-27 VITALS — Wt 220.1 lb

## 2022-04-27 DIAGNOSIS — Z483 Aftercare following surgery for neoplasm: Secondary | ICD-10-CM | POA: Insufficient documentation

## 2022-04-27 NOTE — Therapy (Signed)
OUTPATIENT PHYSICAL THERAPY SOZO SCREENING NOTE   Patient Name: Joanna Osborn MRN: [id number] DOB:02/07/1971, 51 y.o., female Today's Date: 04/27/2022  PCP: Lennie Odor, PA REFERRING PROVIDER: Rolm Bookbinder, MD   PT End of Session - 04/27/22 [phone]     Visit Number 7   # unchanged due to screen only   PT Start Time 0947    PT Stop Time 0952    PT Time Calculation (min) 5 min    Activity Tolerance Patient tolerated treatment well    Behavior During Therapy [facility] for tasks assessed/performed             Past Medical History:  Diagnosis Date   Anemia    Anxiety    Chemotherapy-induced peripheral neuropathy ([place])    per pt toes   Depression    Family history of colon cancer 03/19/2021   History of chemotherapy    left breast cancer  04-04-2021  to 07-26-2021 and restarted 08-14-2021   History of external beam radiation therapy    left breast cancer  10-02-2021  to 11-18-2021   History of kidney stones    Hypertension    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   Malignant neoplasm of upper-outer quadrant of left breast in female, estrogen receptor positive ([place]) 03/13/2021   oncologist--- dr Burr Medico;  Stage IB, IDC, ER/ PR/ HER2 +;   chemo 04-04-2021  to 07-26-2021 and started 08-14-2021;  raditation completed 11-18-2021   Port-A-Cath in place 04/02/2021   Wears glasses    Past Surgical History:  Procedure Laterality Date   BIOPSY  02/19/2022   Procedure: BIOPSY;  Surgeon: Rush Landmark Telford Nab., MD;  Location: [facility] ENDOSCOPY;  Service: Gastroenterology;;   BREAST LUMPECTOMY WITH RADIOACTIVE SEED AND SENTINEL LYMPH NODE BIOPSY Left 08/25/2021   Procedure: LEFT BREAST LUMPECTOMY WITH RADIOACTIVE SEED AND AXILLARY SENTINEL LYMPH NODE BIOPSY;  Surgeon: Rolm Bookbinder, MD;  Location: [facility];  Service: General;  Laterality: Left;   CARPAL TUNNEL RELEASE Right 08/17/2008   _0  by dr Fredna Dow;   and excision wrist ganglian cyst   CHOLECYSTECTOMY, LAPAROSCOPIC   06/27/2002   _1  by dr Bubba Camp   ESOPHAGOGASTRODUODENOSCOPY (EGD) WITH PROPOFOL N/A 02/19/2022   Procedure: ESOPHAGOGASTRODUODENOSCOPY (EGD) WITH PROPOFOL;  Surgeon: Irving Copas., MD;  Location: [facility] [place];  Service: Gastroenterology;  Laterality: N/A;   EUS N/A 02/19/2022   Procedure: UPPER ENDOSCOPIC ULTRASOUND (EUS) RADIAL;  Surgeon: Irving Copas., MD;  Location: [facility];  Service: Gastroenterology;  Laterality: N/A;   FINE NEEDLE ASPIRATION  02/19/2022   Procedure: FINE NEEDLE ASPIRATION (FNA) LINEAR;  Surgeon: Irving Copas., MD;  Location: [facility];  Service: Gastroenterology;;   HYSTEROSCOPY WITH D & C  11/21/2007   _2  by dr Julien Girt;  polypectomy   INSERTION OF MESH N/A 10/28/2016   Procedure: INSERTION OF MESH;  Surgeon: Clovis [name], MD;  Location: [facility];  Service: General;  Laterality: N/A;   LAPAROSCOPIC SALPINGO OOPHERECTOMY Bilateral 02/02/2022   Procedure: LAPAROSCOPIC BILATERAL SALPINGO OOPHORECTOMY;  Surgeon: Molli Posey, MD;  Location: [facility];  Service: Gynecology;  Laterality: Bilateral;   PORTACATH PLACEMENT Right 04/02/2021   Procedure: INSERTION PORT-A-CATH;  Surgeon: Rolm Bookbinder, MD;  Location: [facility];  Service: General;  Laterality: Right;   [place] N/A 10/28/2016   Procedure: LAPAROSCOPIC VENTRAL HERNIA REPAIR;  Surgeon: Clovis [name], MD;  Location: [facility];  Service: General;  Laterality: N/A;   Patient  Active Problem List   Diagnosis Date Noted   Left breast abscess 09/12/2021   Essential hypertension 09/12/2021   Lesion of pancreas 09/12/2021   Hypokalemia 09/12/2021   Genetic testing 04/08/2021   Port-A-Cath in place 04/03/2021   Family history of colon cancer 03/19/2021   Malignant neoplasm of upper-outer quadrant of left breast in female, estrogen receptor positive ([place]) 03/13/2021   Suicide ideation 07/07/2013   Major  depressive disorder, recurrent severe without psychotic features ([place]) 07/06/2013    REFERRING DIAG: left breast cancer at risk for lymphedema  THERAPY DIAG: Aftercare following surgery for neoplasm  PERTINENT HISTORY: Patient was diagnosed on 03/05/2021 with left grade III invasive ductal carcinoma breast cancer. She underwent neoadjuvant chemotherapy from 04/03/2021-07/24/2021. She had a left lumpectomy and sentinel node biopsy (2 negative nodes) on 08/25/2021. It is triple positive with a Ki67 of 40%.   PRECAUTIONS: left UE Lymphedema risk, None  SUBJECTIVE: Pt returns for her 1 month reassess after having a change of baseline higher than 6.5 which indicates subclinical lymphedema.  "I've worn my compression sleeve almost every day."  PAIN:  Are you having pain? No  SOZO SCREENING: Patient was assessed today using the SOZO machine to determine the lymphedema index score. This was compared to her baseline score. It was determined that she is within the recommended range when compared to her baseline and no further action is needed at this time. She will continue SOZO screenings. These are done every 3 months for 2 years post operatively followed by every 6 months for 2 years, and then annually.  Did encourage pt to cont wear of compression sleeve during periods of highly repetitive activities, when increasing her HR (I.e. walking) and/or flying. Pt verbalized good understanding and was agreeable to this.     L-DEX FLOWSHEETS - 04/27/22 0900       L-DEX LYMPHEDEMA SCREENING   Measurement Type Unilateral    L-DEX MEASUREMENT EXTREMITY Upper Extremity    POSITION  Standing    DOMINANT SIDE Right    At Risk Side Left    BASELINE SCORE (UNILATERAL) -2.7    L-DEX SCORE (UNILATERAL) 0.4    VALUE CHANGE (UNILAT) 3.1              Otelia Limes, PTA 04/27/2022, 9:53 AM

## 2022-04-28 ENCOUNTER — Telehealth: Payer: Self-pay | Admitting: Hematology

## 2022-04-28 ENCOUNTER — Other Ambulatory Visit: Payer: Self-pay

## 2022-04-28 NOTE — Telephone Encounter (Signed)
Contacted patient to scheduled appointments. Patient is aware of appointments that are scheduled.   

## 2022-05-04 ENCOUNTER — Ambulatory Visit: Payer: Federal, State, Local not specified - PPO | Admitting: Hematology

## 2022-05-04 ENCOUNTER — Other Ambulatory Visit: Payer: Federal, State, Local not specified - PPO

## 2022-05-18 ENCOUNTER — Ambulatory Visit
Admission: EM | Admit: 2022-05-18 | Discharge: 2022-05-18 | Disposition: A | Discharge status: Home / Self Care | Payer: Federal, State, Local not specified - PPO | Attending: Internal Medicine | Admitting: Internal Medicine

## 2022-05-18 ENCOUNTER — Ambulatory Visit (INDEPENDENT_AMBULATORY_CARE_PROVIDER_SITE_OTHER): Payer: Federal, State, Local not specified - PPO

## 2022-05-18 DIAGNOSIS — M25562 Pain in left knee: Secondary | ICD-10-CM

## 2022-05-18 MED ORDER — NAPROXEN 375 MG PO TABS: 375.0000 mg | ORAL_TABLET | Freq: Two times a day (BID) | ORAL | 0 refills | Status: DC | PRN

## 2022-05-18 NOTE — Discharge Instructions (Signed)
Please wear left knee brace whilst at work or when getting around. Rest and elevate the affected painful area.   Apply cold compresses intermittently as needed.   As pain recedes, begin normal activities slowly as tolerated.   Return to urgent care if left knee pain or swelling worsens.

## 2022-05-18 NOTE — ED Provider Notes (Signed)
[facility]    CSN: [id number] Arrival date & time: 05/18/22  1149      History   Chief Complaint Chief Complaint  Patient presents with   Knee Pain    HPI Joanna Osborn is a 51 y.o. female with a remote history of breast cancer comes to urgent care with 5-day history of left knee pain and swelling.  Patient denies any injury to the left knee.  No prior history of osteoarthritis in the left knee.  Patient describes the pain as throbbing and sharp and aggravated by prolonged standing.  No fever or chills.  No weight loss or night sweats.  No known relieving factors.  Patient wore the brace to work a couple of days ago and that did not really help with the swelling or pain.  No radiation of the pain.   HPI  Past Medical History:  Diagnosis Date   Anemia    Anxiety    Chemotherapy-induced peripheral neuropathy ([place])    per pt toes   Depression    Family history of colon cancer 03/19/2021   History of chemotherapy    left breast cancer  04-04-2021  to 07-26-2021 and restarted 08-14-2021   History of external beam radiation therapy    left breast cancer  10-02-2021  to 11-18-2021   History of kidney stones    Hypertension    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   Malignant neoplasm of upper-outer quadrant of left breast in female, estrogen receptor positive ([place]) 03/13/2021   oncologist--- dr Burr Medico;  Stage IB, IDC, ER/ PR/ HER2 +;   chemo 04-04-2021  to 07-26-2021 and started 08-14-2021;  raditation completed 11-18-2021   Port-A-Cath in place 04/02/2021   Wears glasses     Patient Active Problem List   Diagnosis Date Noted   Left breast abscess 09/12/2021   Essential hypertension 09/12/2021   Lesion of pancreas 09/12/2021   Hypokalemia 09/12/2021   Genetic testing 04/08/2021   Port-A-Cath in place 04/03/2021   Family history of colon cancer 03/19/2021   Malignant neoplasm of upper-outer quadrant of left breast in female, estrogen receptor positive ([place]) 03/13/2021    Suicide ideation 07/07/2013   Major depressive disorder, recurrent severe without psychotic features ([place]) 07/06/2013    Past Surgical History:  Procedure Laterality Date   BIOPSY  02/19/2022   Procedure: BIOPSY;  Surgeon: Irving Copas., MD;  Location: [place];  Service: Gastroenterology;;   BREAST LUMPECTOMY WITH RADIOACTIVE SEED AND SENTINEL LYMPH NODE BIOPSY Left 08/25/2021   Procedure: LEFT BREAST LUMPECTOMY WITH RADIOACTIVE SEED AND AXILLARY SENTINEL LYMPH NODE BIOPSY;  Surgeon: Rolm Bookbinder, MD;  Location: [place];  Service: General;  Laterality: Left;   CARPAL TUNNEL RELEASE Right 08/17/2008   _0  by dr Fredna Dow;   and excision wrist ganglian cyst   CHOLECYSTECTOMY, LAPAROSCOPIC  06/27/2002   _1  by dr Bubba Camp   ESOPHAGOGASTRODUODENOSCOPY (EGD) WITH PROPOFOL N/A 02/19/2022   Procedure: ESOPHAGOGASTRODUODENOSCOPY (EGD) WITH PROPOFOL;  Surgeon: Irving Copas., MD;  Location: [facility];  Service: Gastroenterology;  Laterality: N/A;   EUS N/A 02/19/2022   Procedure: UPPER ENDOSCOPIC ULTRASOUND (EUS) RADIAL;  Surgeon: Irving Copas., MD;  Location: [place];  Service: Gastroenterology;  Laterality: N/A;   FINE NEEDLE ASPIRATION  02/19/2022   Procedure: FINE NEEDLE ASPIRATION (FNA) LINEAR;  Surgeon: Irving Copas., MD;  Location: [place];  Service: Gastroenterology;;   HYSTEROSCOPY WITH D & C  11/21/2007   _2  by dr Julien Girt;  polypectomy  INSERTION OF MESH N/A 10/28/2016   Procedure: INSERTION OF MESH;  Surgeon: Clovis [name], MD;  Location: [place];  Service: General;  Laterality: N/A;   LAPAROSCOPIC SALPINGO OOPHERECTOMY Bilateral 02/02/2022   Procedure: LAPAROSCOPIC BILATERAL SALPINGO OOPHORECTOMY;  Surgeon: Molli Posey, MD;  Location: [facility];  Service: Gynecology;  Laterality: Bilateral;   PORTACATH PLACEMENT Right 04/02/2021   Procedure: INSERTION PORT-A-CATH;  Surgeon: Rolm Bookbinder, MD;  Location: [place];  Service: General;  Laterality: Right;   [place] N/A 10/28/2016   Procedure: LAPAROSCOPIC VENTRAL HERNIA REPAIR;  Surgeon: Clovis [name], MD;  Location: [place];  Service: General;  Laterality: N/A;    OB History     Gravida  2   Para  2   Term  2   Preterm      AB      Living  2      SAB      IAB      Ectopic      Multiple      Live Births               Home Medications    Prior to Admission medications   Medication Sig Start Date End Date Taking? Authorizing Provider  naproxen (NAPROSYN) 375 MG tablet Take 1 tablet (375 mg total) by mouth 2 (two) times daily as needed. 05/18/22  Yes Patric Vanpelt, Myrene Galas, MD  acetaminophen (TYLENOL) 325 MG tablet Take 650 mg by mouth every 6 (six) hours as needed for moderate pain.    [provider]  Cholecalciferol (VITAMIN D3 PO) Take 1 tablet by mouth daily.    [provider]  hydrochlorothiazide (HYDRODIURIL) 25 MG tablet Take 1 tablet (25 mg total) by mouth daily. Patient taking differently: Take 25 mg by mouth daily. 12/05/21   Truitt Merle, MD  KLOR-CON M10 10 MEQ tablet TAKE 1 TABLET BY MOUTH EVERY DAY 04/20/22   Truitt Merle, MD  letrozole [facility]) 2.5 MG tablet Take 1 tablet (2.5 mg total) by mouth daily. 03/05/22   Truitt Merle, MD  omeprazole (PRILOSEC) 40 MG capsule Take 1 capsule (40 mg total) by mouth 2 (two) times daily before a meal. 02/19/22   Mansouraty, Telford Nab., MD  OVER THE COUNTER MEDICATION Alkaseltzer sinus. 2 tabs as needed up to 3 tabs daily as needed.    [provider]  oxyCODONE-acetaminophen (PERCOCET) 5-325 MG tablet Take 1 tablet by mouth every 4 (four) hours as needed for severe pain. 02/02/22   Molli Posey, MD  prochlorperazine (COMPAZINE) 10 MG tablet Take 1 tablet (10 mg total) by mouth every 6 (six) hours as needed (Nausea or vomiting). 03/19/21 09/05/21  Truitt Merle, MD    Family  History Family History  Problem Relation Age of Onset   Kidney disease Mother    Cancer Paternal Aunt        unknown type; dx after 27   Colon cancer Paternal Grandmother        dx unknown age   Colon cancer Paternal Grandfather        dx after 71   Hypertension Other    Cancer Other    Cancer Cousin        unknown type; dx before 87    Social History Social History   Tobacco Use   Smoking status: Never   Smokeless tobacco: Never  Vaping Use   Vaping Use: Never used  Substance Use Topics   Alcohol use: Not Currently    Comment: one glass wine twice monthly   Drug use: Never     Allergies   Endocet [oxycodone-acetaminophen] and Tape   Review of Systems Review of Systems  Gastrointestinal: Negative.   Musculoskeletal:  Positive for arthralgias and joint swelling. Negative for myalgias and neck pain.  Skin: Negative.  Negative for color change and rash.  Neurological: Negative.      Physical Exam Triage Vital Signs ED Triage Vitals  Enc Vitals Group     BP 05/18/22 1326 131/88     Pulse Rate 05/18/22 1326 98     Resp 05/18/22 1326 17     Temp 05/18/22 1326 98 F (36.7 C)     Temp Source 05/18/22 1326 Oral     SpO2 05/18/22 1326 96 %     Weight --      Height --      Head Circumference --      Peak Flow --      Pain Score 05/18/22 1325 8     Pain Loc --      Pain Edu? --      Excl. in [place]? --    No data found.  Updated Vital Signs BP 131/88 (BP Location: Right Arm)   Pulse 98   Temp 98 F (36.7 C) (Oral)   Resp 17   SpO2 96%   Visual Acuity Right Eye Distance:   Left Eye Distance:   Bilateral Distance:    Right Eye Near:   Left Eye Near:    Bilateral Near:     Physical Exam Vitals and nursing note reviewed.  Constitutional:      General: She is not in acute distress.    Appearance: She is not ill-appearing.  Cardiovascular:     Rate and Rhythm: Normal rate and regular rhythm.     Pulses: Normal pulses.     Heart sounds: Normal  heart sounds.  Pulmonary:     Effort: Pulmonary effort is normal.     Breath sounds: Normal breath sounds.  Musculoskeletal:        General: Swelling and tenderness present. No deformity or signs of injury. Normal range of motion.  Skin:    General: Skin is warm.     Findings: No bruising, erythema or lesion.  Neurological:     Mental Status: She is alert.      UC Treatments / Results  Labs (all labs ordered are listed, but only abnormal results are displayed) Labs Reviewed - No data to display  EKG   Radiology DG Knee Complete 4 Views Left  Result Date: 05/18/2022 CLINICAL DATA:  Acute left knee pain and swelling without known injury. EXAM: LEFT KNEE - COMPLETE 4+ VIEW COMPARISON:  None Available. FINDINGS: No evidence of fracture or dislocation. Small suprapatellar joint effusion is noted. No evidence of arthropathy or other focal bone abnormality. Soft tissues are unremarkable. IMPRESSION: Small suprapatellar joint effusion. No fracture or dislocation is noted. Electronically Signed   By: Marijo Conception M.D.   On: 05/18/2022 13:56    Procedures Procedures (including critical care time)  Medications Ordered in UC Medications - No data to display  Initial Impression / Assessment and Plan / UC Course  I have reviewed the triage vital signs and the nursing notes.  Pertinent labs & imaging results that were available during my care of the patient were reviewed by me and considered in my medical decision  making (see chart for details).    1.  Left knee swelling: X-ray of the left knee is negative for arthritis or focal disease. This is likely arthritis of the left knee Left knee brace Naprosyn 375 mg twice daily as needed for pain Icing of the left knee Elevation and resting of the left knee after work Return precautions given.  Final Clinical Impressions(s) / UC Diagnoses   Final diagnoses:  Acute pain of left knee     Discharge Instructions      Please wear  left knee brace whilst at work or when getting around. Rest and elevate the affected painful area.   Apply cold compresses intermittently as needed.   As pain recedes, begin normal activities slowly as tolerated.   Return to urgent care if left knee pain or swelling worsens.     ED Prescriptions     Medication Sig Dispense Auth. Provider   naproxen (NAPROSYN) 375 MG tablet Take 1 tablet (375 mg total) by mouth 2 (two) times daily as needed. 20 tablet Devina Bezold, Myrene Galas, MD      PDMP not reviewed this encounter.   Chase Picket, MD 05/18/22 [phone]

## 2022-05-18 NOTE — ED Triage Notes (Signed)
Pt presents with left knee pain and swelling X 5 days with no known injury.

## 2022-05-25 ENCOUNTER — Inpatient Hospital Stay (HOSPITAL_BASED_OUTPATIENT_CLINIC_OR_DEPARTMENT_OTHER): Payer: Federal, State, Local not specified - PPO | Admitting: Hematology

## 2022-05-25 ENCOUNTER — Encounter: Payer: Self-pay | Admitting: Hematology

## 2022-05-25 ENCOUNTER — Inpatient Hospital Stay: Payer: Federal, State, Local not specified - PPO | Attending: Hematology

## 2022-05-25 VITALS — BP 136/93 | HR 84 | Temp 98.3°F | Resp 18 | Ht 65.0 in | Wt 221.1 lb

## 2022-05-25 DIAGNOSIS — Z9221 Personal history of antineoplastic chemotherapy: Secondary | ICD-10-CM | POA: Insufficient documentation

## 2022-05-25 DIAGNOSIS — Z170 Estrogen receptor positive status [ER+]: Secondary | ICD-10-CM

## 2022-05-25 DIAGNOSIS — D649 Anemia, unspecified: Secondary | ICD-10-CM | POA: Insufficient documentation

## 2022-05-25 DIAGNOSIS — K869 Disease of pancreas, unspecified: Secondary | ICD-10-CM | POA: Insufficient documentation

## 2022-05-25 DIAGNOSIS — Z79899 Other long term (current) drug therapy: Secondary | ICD-10-CM | POA: Insufficient documentation

## 2022-05-25 DIAGNOSIS — Z923 Personal history of irradiation: Secondary | ICD-10-CM | POA: Diagnosis not present

## 2022-05-25 DIAGNOSIS — C50412 Malignant neoplasm of upper-outer quadrant of left female breast: Secondary | ICD-10-CM | POA: Insufficient documentation

## 2022-05-25 DIAGNOSIS — M25562 Pain in left knee: Secondary | ICD-10-CM | POA: Insufficient documentation

## 2022-05-25 DIAGNOSIS — K862 Cyst of pancreas: Secondary | ICD-10-CM | POA: Diagnosis not present

## 2022-05-25 DIAGNOSIS — Z95828 Presence of other vascular implants and grafts: Secondary | ICD-10-CM

## 2022-05-25 LAB — CBC WITH DIFFERENTIAL (CANCER CENTER ONLY)
Abs Immature Granulocytes: 0.02 10*3/uL (ref 0.00–0.07)
Basophils Absolute: 0 10*3/uL (ref 0.0–0.1)
Basophils Relative: 0 %
Eosinophils Absolute: 0.2 10*3/uL (ref 0.0–0.5)
Eosinophils Relative: 3 %
HCT: 34.7 % — ABNORMAL LOW (ref 36.0–46.0)
Hemoglobin: 11.4 g/dL — ABNORMAL LOW (ref 12.0–15.0)
Immature Granulocytes: 0 %
Lymphocytes Relative: 28 %
Lymphs Abs: 1.3 10*3/uL (ref 0.7–4.0)
MCH: 28.1 pg (ref 26.0–34.0)
MCHC: 32.9 g/dL (ref 30.0–36.0)
MCV: 85.7 fL (ref 80.0–100.0)
Monocytes Absolute: 0.3 10*3/uL (ref 0.1–1.0)
Monocytes Relative: 6 %
Neutro Abs: 3 10*3/uL (ref 1.7–7.7)
Neutrophils Relative %: 63 %
Platelet Count: 364 10*3/uL (ref 150–400)
RBC: 4.05 MIL/uL (ref 3.87–5.11)
RDW: 13.4 % (ref 11.5–15.5)
WBC Count: 4.8 10*3/uL (ref 4.0–10.5)
nRBC: 0 % (ref 0.0–0.2)

## 2022-05-25 LAB — CMP (CANCER CENTER ONLY)
ALT: 21 U/L (ref 0–44)
AST: 20 U/L (ref 15–41)
Albumin: 4.2 g/dL (ref 3.5–5.0)
Alkaline Phosphatase: 86 U/L (ref 38–126)
Anion gap: 8 (ref 5–15)
BUN: 26 mg/dL — ABNORMAL HIGH (ref 6–20)
CO2: 28 mmol/L (ref 22–32)
Calcium: 9.9 mg/dL (ref 8.9–10.3)
Chloride: 104 mmol/L (ref 98–111)
Creatinine: 1.09 mg/dL — ABNORMAL HIGH (ref 0.44–1.00)
GFR, Estimated: >60 mL/min (ref >60)
Glucose, Bld: 140 mg/dL — ABNORMAL HIGH (ref 70–99)
Potassium: 3.6 mmol/L (ref 3.5–5.1)
Sodium: 140 mmol/L (ref 135–145)
Total Bilirubin: 0.7 mg/dL (ref 0.3–1.2)
Total Protein: 7.4 g/dL (ref 6.5–8.1)

## 2022-05-25 MED ORDER — SODIUM CHLORIDE 0.9% FLUSH
10.0000 mL | Freq: Once | INTRAVENOUS | Status: AC
  Administered 2022-05-25: 10 mL

## 2022-05-25 MED ORDER — LETROZOLE 2.5 MG PO TABS: 2.5000 mg | ORAL_TABLET | Freq: Every day | ORAL | 1 refills | Status: DC

## 2022-05-25 MED ORDER — HEPARIN SOD (PORK) LOCK FLUSH 100 UNIT/ML IV SOLN
500.0000 [IU] | Freq: Once | INTRAVENOUS | Status: AC
  Administered 2022-05-25: 500 [IU]

## 2022-05-25 NOTE — Progress Notes (Signed)
LVM and sent MyChart message instructing pt to contact our Central Scheduling Team to schedule the MRI of her abdomen that Dr. Burr Medico order.  Provided pt with the telephone number to Central Scheduling and requested if pt could please contact Dr. Ernestina Penna office once she has scheduled her appt.  Also, informed pt that a Mychart message was also sent with this information as well.

## 2022-05-25 NOTE — Progress Notes (Signed)
Joanna Osborn   [phone] Fax:([phone]) [phone]   Clinic Follow up Note   Patient Care Team: Lennie Odor, [place] as PCP - General (Physician Assistant) Rockwell Germany, RN as Oncology Nurse Navigator Mauro Kaufmann, RN as Oncology Nurse Navigator Rolm Bookbinder, MD as Consulting Physician (General Surgery) Truitt Merle, MD as Consulting Physician (Hematology) Kyung Rudd, MD as Consulting Physician (Radiation Oncology) Truitt Merle, MD as Consulting Physician (Hematology) Imaging, [place] as Consulting Physician (Diagnostic Radiology)  Date of Service:  05/25/2022  CHIEF COMPLAINT: f/u of left breast cancer  CURRENT THERAPY:  -Letrozole, starting 11/2021  ASSESSMENT & PLAN:  Joanna Osborn is a 51 y.o. post-BSO female with   1. 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 Left mammogram on 03/05/21 showed 2.1 cm mass at 12 o'clock. Biopsy confirmed invasive ductal carcinoma, grade 3, weakly ER+ and Her2+ -echo from 04/02/21 reviewed, no concerns.  -She received 6 cycles of neoadjuvant TCHP with G-CSF 04/03/21 - 07/24/21 (Docetaxel dose reduced from C4 due to neuropathy). -s/p lumpectomy on 08/25/21 with Dr. Donne Hazel, path showed complete response, no residual disease seen in breast and nodes   -s/p radiation 10/02/21 - 11/18/21 under Dr. Lisbeth Renshaw. -she was started on Zoladex on 11/07/21 and letrozole in 11/2021. She is tolerating well, will continue for total of 5-7 years.  -s/p BSO 02/02/22 with Dr. Matthew Saras, path benign. -repeat echo 02/13/22 showed stable EF but slightly worse GLS, increased to -19.1% from 16.5%. -she completed maintenance HP on 03/12/22.  -she is clinically doing well. Labs reviewed, mild anemia persists with hgb 11.4, CMP overall stable with creatinine slightly elevated at 1.09. I encouraged her to drink enough water.  Physical exam was unremarkable. There is no clinical concern for recurrence. -she is overdue for annual mammogram. I ordered today and asked her to call to schedule.   2. 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 -abdomen MRI on 11/25/21 showed a 2 cm complex cystic mass, suggesting cystic neoplasm.  -EGD/EUS on 02/19/22 with Dr. Rush Landmark, cytology from pancreatic head lesion was negative. -plan for surveillance MRI in next few weeks; I ordered today.   3. Left knee pain -began mid 04/2022 -x-ray on 05/18/22 showed a small suprapatellar joint effusion, no fracture or dislocation. -she tells me she is scheduled to see orthopedics this week.  4. 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 -improving     PLAN: -continue letrozole -mammogram overdue, I ordered today and she will call to schedule  -abdomen MRI to be done soon -Lab and follow-up with NP Lacie in 4 months   No problem-specific Assessment & Plan notes found for this encounter.   SUMMARY OF ONCOLOGIC HISTORY: Oncology History Overview Note   Cancer Staging  Malignant neoplasm of upper-outer quadrant of left breast in female, estrogen receptor positive ([place]) Staging form: Breast, AJCC 8th Edition - Clinical stage from 03/07/2021: Stage IB (cT2, cN0, cM0, G3, ER+, PR+, HER2+) - Signed by Truitt Merle, MD on 03/17/2021 Stage prefix: Initial diagnosis Histologic grading system: 3 grade system - Pathologic stage from 08/25/2021: No Stage Recommended (ypT0, pN0, cM0, G3, ER+, PR+, HER2+) - Signed by Truitt Merle, MD on 09/05/2021 Stage prefix: Post-therapy Response to neoadjuvant therapy: Complete response Nuclear grade: G3 Histologic  grading system: 3 grade system     Malignant neoplasm of upper-outer quadrant of left breast in female, estrogen receptor positive ([place])  03/05/2021 Mammogram   Diagnostic Left  Mammogram; Left Breast Ultrasound  IMPRESSION: At the palpable site of concern in the left breast at 12 o'clock there is a suspicious mass measuring 2.1 cm.   03/07/2021 Cancer Staging   Staging form: Breast, AJCC 8th Edition - Clinical stage from 03/07/2021: Stage IB (cT2, cN0, cM0, G3, ER+, PR+, HER2+) - Signed by Truitt Merle, MD on 03/17/2021 Stage prefix: Initial diagnosis Histologic grading system: 3 grade system   03/07/2021 Pathology Results   Diagnosis Breast, left, needle core biopsy, left breast 12 o' clock - INVASIVE DUCTAL CARCINOMA - SEE COMMENT Based on the biopsy, the carcinoma appears Nottingham grade 3 of 3  PROGNOSTIC INDICATORS Results: IMMUNOHISTOCHEMICAL AND MORPHOMETRIC ANALYSIS PERFORMED MANUALLY The tumor cells are POSITIVE for Her2 (3+). Estrogen Receptor: 30%, POSITIVE, WEAK STAINING INTENSITY Progesterone Receptor: 15%, POSITIVE, MODERATE STAINING INTENSITY Proliferation Marker Ki67: 40%     03/13/2021 Initial Diagnosis   Malignant neoplasm of upper-outer quadrant of left breast in female, estrogen receptor positive ([place])   03/26/2021 Imaging   MRI Breast  IMPRESSION: 1. 2.5 cm biopsy-proven malignancy within the UPPER LEFT breast. 2. Abnormal lymph node with focal cortical thickening in the posterior UPPER-OUTER LEFT breast/LOWER LEFT axilla. 2nd-look ultrasound with possible biopsy is recommended. 3. Indeterminate 0.8 cm OUTER RIGHT breast mass. 2nd-look ultrasound with possible biopsy is recommended.   03/29/2021 Genetic Testing   Negative hereditary cancer genetic testing: no pathogenic variants detected in [facility] CustomNext-Cancer +RNAinsight Panel.  The report date is March 29, 2021.   The CustomNext-Cancer+RNAinsight panel offered by [facility] includes sequencing and rearrangement analysis for the following 47 genes:  APC, ATM, AXIN2, BARD1, BMPR1A, BRCA1, BRCA2, BRIP1, CDH1, CDK4, CDKN2A, CHEK2, DICER1, EPCAM, GREM1, HOXB13, MEN1, MLH1,  MSH2, MSH3, MSH6, MUTYH, NBN, NF1, NF2, NTHL1, PALB2, PMS2, POLD1, POLE, PTEN, RAD51C, RAD51D, RECQL, RET, SDHA, SDHAF2, SDHB, SDHC, SDHD, SMAD4, SMARCA4, STK11, TP53, TSC1, TSC2, and VHL.  RNA data is routinely analyzed for use in variant interpretation for all genes.   04/04/2021 - 07/26/2021 Chemotherapy   Patient is on Treatment Plan : BREAST  Docetaxel + Carboplatin + Trastuzumab + Pertuzumab  (TCHP) q21d      04/14/2021 Imaging   [place] Bilateral Breast  IMPRESSION: 1. No sonographic correlate for the 8 mm enhancing mass in the outer right breast. Recommendation is to prior seed with MRI guided biopsy. 2. An undulating, low lying lymph node along the 1 o'clock axis of the left breast demonstrates no areas of cortical thickening beyond 3 mm.   08/14/2021 -  Chemotherapy   Patient is on Treatment Plan : BREAST Trastuzumab + Pertuzumab q21d     08/25/2021 Cancer Staging   Staging form: Breast, AJCC 8th Edition - Pathologic stage from 08/25/2021: No Stage Recommended (ypT0, pN0, cM0, G3, ER+, PR+, HER2+) - Signed by Truitt Merle, MD on 09/05/2021 Stage prefix: Post-therapy Response to neoadjuvant therapy: Complete response Nuclear grade: G3 Histologic grading system: 3 grade system   08/25/2021 Definitive Surgery   FINAL MICROSCOPIC DIAGNOSIS:   A. BREAST, LEFT, LUMPECTOMY:  - No evidence of residual carcinoma - complete therapeutic response  - Therapy related changes, including hyalin fibrosis  - Fibrocystic change with urinary  - See comment   B. BREAST, LEFT ADDITIONAL MEDIAL MARGIN, EXCISION:  - Fibrocystic change  - Negative for carcinoma   C. BREAST,  LEFT ADDITIONAL SUPERIOR MARGIN, EXCISION:  - Fibrocystic change  - Negative for carcinoma   D. BREAST, LEFT ADDITIONAL POSTERIOR MARGIN, EXCISION:  - Benign breast parenchyma  - Negative for carcinoma   E. BREAST, LEFT ADDITIONAL ANTERIOR MARGIN, EXCISION:  - Benign breast parenchyma  - Negative for carcinoma   F. LYMPH NODE,  LEFT AXILLARY, SENTINEL, EXCISION:  - Lymph node, negative for carcinoma (0/1)   G. LYMPH NODE, LEFT AXILLARY, SENTINEL, EXCISION:  - Lymph node, negative for carcinoma (0/1)    COMMENT:  The appropriate pathologic stage (AJCC eighth edition) is ypT0 ypN0.       INTERVAL HISTORY:  Joanna Osborn is here for a follow up of breast cancer. She was last seen by me on 01/30/22. She presents to the clinic alone. She reports she is doing well overall. She reports new left knee pain and swelling that started about 2 weeks ago. She tells me she is scheduled to see orthopedics. She denies any other joint pains but does note some stiffness in the mornings.   All other systems were reviewed with the patient and are negative.  MEDICAL HISTORY:  Past Medical History:  Diagnosis Date   Anemia    Anxiety    Chemotherapy-induced peripheral neuropathy ([place])    per pt toes   Depression    Family history of colon cancer 03/19/2021   History of chemotherapy    left breast cancer  04-04-2021  to 07-26-2021 and restarted 08-14-2021   History of external beam radiation therapy    left breast cancer  10-02-2021  to 11-18-2021   History of kidney stones    Hypertension    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   Malignant neoplasm of upper-outer quadrant of left breast in female, estrogen receptor positive ([place]) 03/13/2021   oncologist--- dr Burr Medico;  Stage IB, IDC, ER/ PR/ HER2 +;   chemo 04-04-2021  to 07-26-2021 and started 08-14-2021;  raditation completed 11-18-2021   Port-A-Cath in place 04/02/2021   Wears glasses     SURGICAL HISTORY: Past Surgical History:  Procedure Laterality Date   BIOPSY  02/19/2022   Procedure: BIOPSY;  Surgeon: Irving Copas., MD;  Location: [facility] ENDOSCOPY;  Service: Gastroenterology;;   BREAST LUMPECTOMY WITH RADIOACTIVE SEED AND SENTINEL LYMPH NODE BIOPSY Left 08/25/2021   Procedure: LEFT BREAST LUMPECTOMY WITH RADIOACTIVE SEED AND AXILLARY SENTINEL LYMPH NODE BIOPSY;   Surgeon: Rolm Bookbinder, MD;  Location: [place];  Service: General;  Laterality: Left;   CARPAL TUNNEL RELEASE Right 08/17/2008   _0  by dr Fredna Dow;   and excision wrist ganglian cyst   CHOLECYSTECTOMY, LAPAROSCOPIC  06/27/2002   _1  by dr Bubba Camp   ESOPHAGOGASTRODUODENOSCOPY (EGD) WITH PROPOFOL N/A 02/19/2022   Procedure: ESOPHAGOGASTRODUODENOSCOPY (EGD) WITH PROPOFOL;  Surgeon: Irving Copas., MD;  Location: [place];  Service: Gastroenterology;  Laterality: N/A;   EUS N/A 02/19/2022   Procedure: UPPER ENDOSCOPIC ULTRASOUND (EUS) RADIAL;  Surgeon: Irving Copas., MD;  Location: [place];  Service: Gastroenterology;  Laterality: N/A;   FINE NEEDLE ASPIRATION  02/19/2022   Procedure: FINE NEEDLE ASPIRATION (FNA) LINEAR;  Surgeon: Irving Copas., MD;  Location: [place];  Service: Gastroenterology;;   HYSTEROSCOPY WITH D & C  11/21/2007   _2  by dr Julien Girt;  polypectomy   INSERTION OF MESH N/A 10/28/2016   Procedure: INSERTION OF MESH;  Surgeon: Clovis [name], MD;  Location: [place];  Service: General;  Laterality: N/A;   LAPAROSCOPIC SALPINGO OOPHERECTOMY Bilateral 02/02/2022  Procedure: LAPAROSCOPIC BILATERAL SALPINGO OOPHORECTOMY;  Surgeon: Molli Posey, MD;  Location: [facility];  Service: Gynecology;  Laterality: Bilateral;   PORTACATH PLACEMENT Right 04/02/2021   Procedure: INSERTION PORT-A-CATH;  Surgeon: Rolm Bookbinder, MD;  Location: [place];  Service: General;  Laterality: Right;   [place] N/A 10/28/2016   Procedure: LAPAROSCOPIC VENTRAL HERNIA REPAIR;  Surgeon: Clovis [name], MD;  Location: [place];  Service: General;  Laterality: N/A;    I have reviewed the social history and family history with the patient and they are unchanged from previous note.  ALLERGIES:  is allergic to endocet [oxycodone-acetaminophen], propoxyphene, and  tape.  MEDICATIONS:  Current Outpatient Medications  Medication Sig Dispense Refill   acetaminophen (TYLENOL) 325 MG tablet Take 650 mg by mouth every 6 (six) hours as needed for moderate pain.     Cholecalciferol (VITAMIN D3 PO) Take 1 tablet by mouth daily.     hydrochlorothiazide (HYDRODIURIL) 25 MG tablet Take 1 tablet (25 mg total) by mouth daily. (Patient taking differently: Take 25 mg by mouth daily.) 30 tablet 0   KLOR-CON M10 10 MEQ tablet TAKE 1 TABLET BY MOUTH EVERY DAY 90 tablet 0   letrozole (FEMARA) 2.5 MG tablet Take 1 tablet (2.5 mg total) by mouth daily. 90 tablet 1   naproxen (NAPROSYN) 375 MG tablet Take 1 tablet (375 mg total) by mouth 2 (two) times daily as needed. 20 tablet 0   omeprazole (PRILOSEC) 40 MG capsule Take 1 capsule (40 mg total) by mouth 2 (two) times daily before a meal. 60 capsule 6   OVER THE COUNTER MEDICATION Alkaseltzer sinus. 2 tabs as needed up to 3 tabs daily as needed.     oxyCODONE-acetaminophen (PERCOCET) 5-325 MG tablet Take 1 tablet by mouth every 4 (four) hours as needed for severe pain. 30 tablet 0   No current facility-administered medications for this visit.    PHYSICAL EXAMINATION: ECOG PERFORMANCE STATUS: 0 - Asymptomatic  Vitals:   05/25/22 1017  BP: (!) 136/93  Pulse: 84  Resp: 18  Temp: 98.3 F (36.8 C)  SpO2: 100%   Wt Readings from Last 3 Encounters:  05/25/22 221 lb 1.6 oz (100.3 kg)  04/27/22 220 lb 2 oz (99.8 kg)  03/23/22 220 lb 2 oz (99.8 kg)     GENERAL:alert, no distress and comfortable SKIN: skin color, texture, turgor are normal, no rashes or significant lesions EYES: normal, Conjunctiva are pink and non-injected, sclera clear  NECK: supple, thyroid normal size, non-tender, without nodularity LYMPH:  no palpable lymphadenopathy in the cervical, axillary LUNGS: clear to auscultation and percussion with normal breathing effort HEART: regular rate & rhythm and no murmurs and no lower extremity  edema ABDOMEN:abdomen soft, non-tender and normal bowel sounds Musculoskeletal:no cyanosis of digits and no clubbing  NEURO: alert & oriented x 3 with fluent speech, no focal motor/sensory deficits BREAST: No palpable mass, nodules or adenopathy bilaterally. Breast exam benign.   LABORATORY DATA:  I have reviewed the data as listed    Latest Ref Rng & Units 05/25/2022    9:38 AM 03/12/2022    2:20 PM 02/24/2022   11:17 AM  CBC  WBC 4.0 - 10.5 K/uL 4.8  4.9  5.3   Hemoglobin 12.0 - 15.0 g/dL 11.4  10.6  12.0   Hematocrit 36.0 - 46.0 % 34.7  31.9  36.4   Platelets 150 - 400 K/uL 364  300  366.0  Latest Ref Rng & Units 05/25/2022    9:38 AM 03/12/2022    2:20 PM 02/24/2022   11:17 AM  CMP  Glucose 70 - 99 mg/dL 140  95  117   BUN 6 - 20 mg/dL _0 Creatinine 0.44 - 1.00 mg/dL 1.09  0.81  0.82   Sodium 135 - 145 mmol/L 140  142  138   Potassium 3.5 - 5.1 mmol/L 3.6  3.5  3.7   Chloride 98 - 111 mmol/L 104  110  101   CO2 22 - 32 mmol/L _1 Calcium 8.9 - 10.3 mg/dL 9.9  9.7  10.2   Total Protein 6.5 - 8.1 g/dL 7.4  7.2  7.8   Total Bilirubin 0.3 - 1.2 mg/dL 0.7  0.7  0.8   Alkaline Phos 38 - 126 U/L 86  65  72   AST 15 - 41 U/L _2 ALT 0 - 44 U/L _3 RADIOGRAPHIC STUDIES: I have personally reviewed the radiological images as listed and agreed with the findings in the report. No results found.    Orders Placed This Encounter  Procedures   MR Abdomen W Wo Contrast    Standing Status:   Future    Standing Expiration Date:   05/26/2023    Order Specific Question:   If indicated for the ordered procedure, I authorize the administration of contrast media per Radiology protocol    Answer:   Yes    Order Specific Question:   What is the patient's sedation requirement?    Answer:   No Sedation    Order Specific Question:   Does the patient have a pacemaker or implanted devices?    Answer:   No    Order Specific Question:    Preferred imaging location?    Answer:   [facility] (table limit - 550 lbs)   MM DIAG BREAST TOMO BILATERAL    Standing Status:   Future    Standing Expiration Date:   05/26/2023    Order Specific Question:   Reason for Exam (SYMPTOM  OR DIAGNOSIS REQUIRED)    Answer:   screening    Order Specific Question:   Preferred imaging location?    Answer:   [facility]    Order Specific Question:   Is the patient pregnant?    Answer:   No   All questions were answered. The patient knows to call the clinic with any problems, questions or concerns. No barriers to learning was detected. The total time spent in the appointment was 30 minutes.     Truitt Merle, MD 05/25/2022   I, Wilburn Mylar, am acting as scribe for Truitt Merle, MD.   I have reviewed the above documentation for accuracy and completeness, and I agree with the above.

## 2022-05-25 NOTE — Progress Notes (Unsigned)
Epic [facility]25[facility]2022.  PA request for order was sent to Ross Ludwig in [employer] on 05/25/2022 (awaiting financial clearance).  Will fax authorized order again to [place] when authorized by pt's insurance.  Epic fax confirmation received.

## 2022-05-26 ENCOUNTER — Telehealth: Payer: Self-pay | Admitting: Hematology

## 2022-05-26 NOTE — Telephone Encounter (Signed)
Per 10/30 los called and spoke to pt about appointment  

## 2022-05-27 ENCOUNTER — Other Ambulatory Visit: Payer: Self-pay

## 2022-05-28 DIAGNOSIS — M25562 Pain in left knee: Secondary | ICD-10-CM | POA: Diagnosis not present

## 2022-06-01 DIAGNOSIS — M25562 Pain in left knee: Secondary | ICD-10-CM | POA: Diagnosis not present

## 2022-06-08 ENCOUNTER — Other Ambulatory Visit: Payer: Self-pay | Admitting: Hematology

## 2022-06-08 ENCOUNTER — Ambulatory Visit (HOSPITAL_COMMUNITY)
Admission: RE | Admit: 2022-06-08 | Discharge: 2022-06-08 | Disposition: A | Discharge status: Home / Self Care | Payer: Federal, State, Local not specified - PPO | Source: Ambulatory Visit | Attending: Hematology | Admitting: Hematology

## 2022-06-08 DIAGNOSIS — K862 Cyst of pancreas: Secondary | ICD-10-CM | POA: Insufficient documentation

## 2022-06-08 DIAGNOSIS — R935 Abnormal findings on diagnostic imaging of other abdominal regions, including retroperitoneum: Secondary | ICD-10-CM | POA: Diagnosis not present

## 2022-06-08 DIAGNOSIS — D259 Leiomyoma of uterus, unspecified: Secondary | ICD-10-CM | POA: Diagnosis not present

## 2022-06-08 DIAGNOSIS — K760 Fatty (change of) liver, not elsewhere classified: Secondary | ICD-10-CM | POA: Diagnosis not present

## 2022-06-08 MED ORDER — GADOBUTROL 1 MMOL/ML IV SOLN
10.0000 mL | Freq: Once | INTRAVENOUS | Status: AC | PRN
  Administered 2022-06-08: 10 mL via INTRAVENOUS

## 2022-06-10 ENCOUNTER — Inpatient Hospital Stay: Payer: Federal, State, Local not specified - PPO | Attending: Hematology | Admitting: Hematology

## 2022-06-10 ENCOUNTER — Encounter: Payer: Self-pay | Admitting: Hematology

## 2022-06-10 DIAGNOSIS — C50412 Malignant neoplasm of upper-outer quadrant of left female breast: Secondary | ICD-10-CM

## 2022-06-10 DIAGNOSIS — Z170 Estrogen receptor positive status [ER+]: Secondary | ICD-10-CM

## 2022-06-10 NOTE — Progress Notes (Signed)
[place]   [phone] Fax:([phone]) [phone]   Clinic Follow up Note   Patient Care Team: Lennie Odor, [place] as PCP - General (Physician Assistant) Rockwell Germany, RN as Oncology Nurse Navigator Mauro Kaufmann, RN as Oncology Nurse Navigator Rolm Bookbinder, MD as Consulting Physician (General Surgery) Truitt Merle, MD as Consulting Physician (Hematology) Kyung Rudd, MD as Consulting Physician (Radiation Oncology) Truitt Merle, MD as Consulting Physician (Hematology) Imaging, [facility] as Consulting Physician (Diagnostic Radiology)  Date of Service:  06/10/2022  I connected with Joanna Osborn on 06/10/2022 at 10:40 AM EST by video enabled telemedicine visit and verified that I am speaking with the correct person using two identifiers.  I discussed the limitations, risks, security and privacy concerns of performing an evaluation and management service by telephone and the availability of in person appointments. I also discussed with the patient that there may be a patient responsible charge related to this service. The patient expressed understanding and agreed to proceed.   Other persons participating in the visit and their role in the encounter:  none  Patient's location:  work/her car Provider's location:  my office  CHIEF COMPLAINT: review recent scan results, f/u of left breast cancer  CURRENT THERAPY:  -Letrozole, starting 11/2021   ASSESSMENT & PLAN:  Joanna Osborn is a 51 y.o. post-BSO female with   1. 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 -abdomen MRI on 11/25/21 showed a 2 cm complex cystic mass, suggesting cystic neoplasm.  -EGD/EUS on 02/19/22 with Dr. Rush Landmark, cytology from pancreatic head lesion was negative. -surveillance MRI 06/08/22 showed stable lesion without discrete enhancing nodularity. I personally reviewed the results and images with her today.  Plan to repeat abdominal MRI in 6 months. I also reached out to Dr. Rush Landmark to see if he has additional thoughts  -She does also have some fatty liver; I encouraged her to eat healthy and loose some weight.  2. 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 Treated with 6 cycles of neoadjuvant TCHP with G-CSF 04/03/21 - 07/24/21  -s/p lumpectomy on 08/25/21 with Dr. Donne Hazel, path showed complete response, no residual disease -s/p radiation 10/02/21 - 11/18/21 under Dr. Lisbeth Renshaw. -she was started on Zoladex on 11/07/21 and letrozole in 11/2021. She is tolerating well, will continue for total of 5-7 years.  -s/p BSO 02/02/22 with Dr. Matthew Saras, path benign. -repeat echo 02/13/22 showed stable EF but slightly worse GLS, increased to -19.1% from 16.5%. -she completed maintenance HP on 03/12/22.  -mammogram scheduled 07/01/22     PLAN: -continue letrozole -mammogram 07/01/22 -Lab and follow-up with NP Lacie in 3 months   No problem-specific Assessment & Plan notes found for this encounter.   SUMMARY OF ONCOLOGIC HISTORY: Oncology History Overview Note   Cancer Staging  Malignant neoplasm of upper-outer quadrant of left breast in female, estrogen receptor positive ([place]) Staging form: Breast, AJCC 8th Edition - Clinical stage from 03/07/2021: Stage IB (cT2, cN0, cM0, G3, ER+, PR+, HER2+) - Signed by Truitt Merle, MD on 03/17/2021 Stage prefix: Initial diagnosis Histologic grading system: 3 grade system - Pathologic stage from 08/25/2021: No Stage Recommended (ypT0, pN0, cM0, G3, ER+, PR+, HER2+) - Signed by Truitt Merle, MD on 09/05/2021 Stage prefix: Post-therapy Response to neoadjuvant therapy: Complete response Nuclear grade: G3 Histologic  grading system: 3 grade system     Malignant neoplasm of upper-outer quadrant of left breast in female, estrogen receptor positive ([place])   03/05/2021 Mammogram   Diagnostic Left Mammogram; Left Breast Ultrasound  IMPRESSION: At the palpable site of concern in the left breast at 12 o'clock there is a suspicious mass measuring 2.1 cm.   03/07/2021 Cancer Staging   Staging form: Breast, AJCC 8th Edition - Clinical stage from 03/07/2021: Stage IB (cT2, cN0, cM0, G3, ER+, PR+, HER2+) - Signed by Truitt Merle, MD on 03/17/2021 Stage prefix: Initial diagnosis Histologic grading system: 3 grade system   03/07/2021 Pathology Results   Diagnosis Breast, left, needle core biopsy, left breast 12 o' clock - INVASIVE DUCTAL CARCINOMA - SEE COMMENT Based on the biopsy, the carcinoma appears Nottingham grade 3 of 3  PROGNOSTIC INDICATORS Results: IMMUNOHISTOCHEMICAL AND MORPHOMETRIC ANALYSIS PERFORMED MANUALLY The tumor cells are POSITIVE for Her2 (3+). Estrogen Receptor: 30%, POSITIVE, WEAK STAINING INTENSITY Progesterone Receptor: 15%, POSITIVE, MODERATE STAINING INTENSITY Proliferation Marker Ki67: 40%     03/13/2021 Initial Diagnosis   Malignant neoplasm of upper-outer quadrant of left breast in female, estrogen receptor positive ([place])   03/26/2021 Imaging   MRI Breast  IMPRESSION: 1. 2.5 cm biopsy-proven malignancy within the UPPER LEFT breast. 2. Abnormal lymph node with focal cortical thickening in the posterior UPPER-OUTER LEFT breast/LOWER LEFT axilla. 2nd-look ultrasound with possible biopsy is recommended. 3. Indeterminate 0.8 cm OUTER RIGHT breast mass. 2nd-look ultrasound with possible biopsy is recommended.   03/29/2021 Genetic Testing   Negative hereditary cancer genetic testing: no pathogenic variants detected in [facility] CustomNext-Cancer +RNAinsight Panel.  The report date is March 29, 2021.   The CustomNext-Cancer+RNAinsight panel offered by [facility] includes sequencing and rearrangement analysis for the following 47 genes:  APC, ATM, AXIN2, BARD1, BMPR1A, BRCA1, BRCA2, BRIP1, CDH1, CDK4, CDKN2A, CHEK2,  DICER1, EPCAM, GREM1, HOXB13, MEN1, MLH1, MSH2, MSH3, MSH6, MUTYH, NBN, NF1, NF2, NTHL1, PALB2, PMS2, POLD1, POLE, PTEN, RAD51C, RAD51D, RECQL, RET, SDHA, SDHAF2, SDHB, SDHC, SDHD, SMAD4, SMARCA4, STK11, TP53, TSC1, TSC2, and VHL.  RNA data is routinely analyzed for use in variant interpretation for all genes.   04/04/2021 - 07/26/2021 Chemotherapy   Patient is on Treatment Plan : BREAST  Docetaxel + Carboplatin + Trastuzumab + Pertuzumab  (TCHP) q21d      04/14/2021 Imaging   [place] Bilateral Breast  IMPRESSION: 1. No sonographic correlate for the 8 mm enhancing mass in the outer right breast. Recommendation is to prior seed with MRI guided biopsy. 2. An undulating, low lying lymph node along the 1 o'clock axis of the left breast demonstrates no areas of cortical thickening beyond 3 mm.   08/14/2021 - 03/12/2022 Chemotherapy   Patient is on Treatment Plan : BREAST Trastuzumab + Pertuzumab q21d     08/25/2021 Cancer Staging   Staging form: Breast, AJCC 8th Edition - Pathologic stage from 08/25/2021: No Stage Recommended (ypT0, pN0, cM0, G3, ER+, PR+, HER2+) - Signed by Truitt Merle, MD on 09/05/2021 Stage prefix: Post-therapy Response to neoadjuvant therapy: Complete response Nuclear grade: G3 Histologic grading system: 3 grade system   08/25/2021 Definitive Surgery   FINAL MICROSCOPIC DIAGNOSIS:   A. BREAST, LEFT, LUMPECTOMY:  - No evidence of residual carcinoma - complete therapeutic response  - Therapy related changes, including hyalin fibrosis  - Fibrocystic change with urinary  - See comment   B. BREAST, LEFT ADDITIONAL MEDIAL MARGIN, EXCISION:  - Fibrocystic change  - Negative for carcinoma   C. BREAST,  LEFT ADDITIONAL SUPERIOR MARGIN, EXCISION:  - Fibrocystic change  - Negative for carcinoma   D. BREAST, LEFT ADDITIONAL POSTERIOR MARGIN, EXCISION:  - Benign breast parenchyma  - Negative for carcinoma   E. BREAST, LEFT ADDITIONAL ANTERIOR MARGIN, EXCISION:  - Benign breast  parenchyma  - Negative for carcinoma   F. LYMPH NODE, LEFT AXILLARY, SENTINEL, EXCISION:  - Lymph node, negative for carcinoma (0/1)   G. LYMPH NODE, LEFT AXILLARY, SENTINEL, EXCISION:  - Lymph node, negative for carcinoma (0/1)    COMMENT:  The appropriate pathologic stage (AJCC eighth edition) is ypT0 ypN0.       INTERVAL HISTORY:  Joanna Osborn was contacted for a follow up of recent MRI. She was last seen by me on 05/25/22. She reports she is doing well overall, no new concerns.    All other systems were reviewed with the patient and are negative.  MEDICAL HISTORY:  Past Medical History:  Diagnosis Date   Anemia    Anxiety    Chemotherapy-induced peripheral neuropathy ([place])    per pt toes   Depression    Family history of colon cancer 03/19/2021   History of chemotherapy    left breast cancer  04-04-2021  to 07-26-2021 and restarted 08-14-2021   History of external beam radiation therapy    left breast cancer  10-02-2021  to 11-18-2021   History of kidney stones    Hypertension    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   Malignant neoplasm of upper-outer quadrant of left breast in female, estrogen receptor positive ([place]) 03/13/2021   oncologist--- dr Burr Medico;  Stage IB, IDC, ER/ PR/ HER2 +;   chemo 04-04-2021  to 07-26-2021 and started 08-14-2021;  raditation completed 11-18-2021   Port-A-Cath in place 04/02/2021   Wears glasses     SURGICAL HISTORY: Past Surgical History:  Procedure Laterality Date   BIOPSY  02/19/2022   Procedure: BIOPSY;  Surgeon: Irving Copas., MD;  Location[facility] ENDOSCOPY;  Service: Gastroenterology;;   BREAST LUMPECTOMY WITH RADIOACTIVE SEED AND SENTINEL LYMPH NODE BIOPSY Left 08/25/2021   Procedure: LEFT BREAST LUMPECTOMY WITH RADIOACTIVE SEED AND AXILLARY SENTINEL LYMPH NODE BIOPSY;  Surgeon: Rolm Bookbinder, MD;  Location: [place];  Service: General;  Laterality: Left;   CARPAL TUNNEL RELEASE Right 08/17/2008   _0  by dr  Fredna Dow;   and excision wrist ganglian cyst   CHOLECYSTECTOMY, LAPAROSCOPIC  06/27/2002   _1  by dr Bubba Camp   ESOPHAGOGASTRODUODENOSCOPY (EGD) WITH PROPOFOL N/A 02/19/2022   Procedure: ESOPHAGOGASTRODUODENOSCOPY (EGD) WITH PROPOFOL;  Surgeon: Irving Copas., MD;  Location: [place];  Service: Gastroenterology;  Laterality: N/A;   EUS N/A 02/19/2022   Procedure: UPPER ENDOSCOPIC ULTRASOUND (EUS) RADIAL;  Surgeon: Irving Copas., MD;  Location: [place];  Service: Gastroenterology;  Laterality: N/A;   FINE NEEDLE ASPIRATION  02/19/2022   Procedure: FINE NEEDLE ASPIRATION (FNA) LINEAR;  Surgeon: Irving Copas., MD;  Location: [place];  Service: Gastroenterology;;   HYSTEROSCOPY WITH D & C  11/21/2007   _2  by dr Julien Girt;  polypectomy   INSERTION OF MESH N/A 10/28/2016   Procedure: INSERTION OF MESH;  Surgeon: Clovis [name], MD;  Location: [place];  Service: General;  Laterality: N/A;   LAPAROSCOPIC SALPINGO OOPHERECTOMY Bilateral 02/02/2022   Procedure: LAPAROSCOPIC BILATERAL SALPINGO OOPHORECTOMY;  Surgeon: Molli Posey, MD;  Location[facility];  Service: Gynecology;  Laterality: Bilateral;   PORTACATH PLACEMENT Right 04/02/2021   Procedure: INSERTION PORT-A-CATH;  Surgeon: Rolm Bookbinder,  MD;  Location: [place];  Service: General;  Laterality: Right;   [place] N/A 10/28/2016   Procedure: LAPAROSCOPIC VENTRAL HERNIA REPAIR;  Surgeon: Clovis [name], MD;  Location: [place];  Service: General;  Laterality: N/A;    I have reviewed the social history and family history with the patient and they are unchanged from previous note.  ALLERGIES:  is allergic to endocet [oxycodone-acetaminophen], propoxyphene, and tape.  MEDICATIONS:  Current Outpatient Medications  Medication Sig Dispense Refill   acetaminophen (TYLENOL) 325 MG tablet Take 650 mg by mouth every 6 (six) hours as needed  for moderate pain.     Cholecalciferol (VITAMIN D3 PO) Take 1 tablet by mouth daily.     hydrochlorothiazide (HYDRODIURIL) 25 MG tablet Take 1 tablet (25 mg total) by mouth daily. (Patient taking differently: Take 25 mg by mouth daily.) 30 tablet 0   KLOR-CON M10 10 MEQ tablet TAKE 1 TABLET BY MOUTH EVERY DAY 90 tablet 0   letrozole (FEMARA) 2.5 MG tablet Take 1 tablet (2.5 mg total) by mouth daily. 90 tablet 1   naproxen (NAPROSYN) 375 MG tablet Take 1 tablet (375 mg total) by mouth 2 (two) times daily as needed. 20 tablet 0   omeprazole (PRILOSEC) 40 MG capsule Take 1 capsule (40 mg total) by mouth 2 (two) times daily before a meal. 60 capsule 6   OVER THE COUNTER MEDICATION Alkaseltzer sinus. 2 tabs as needed up to 3 tabs daily as needed.     oxyCODONE-acetaminophen (PERCOCET) 5-325 MG tablet Take 1 tablet by mouth every 4 (four) hours as needed for severe pain. 30 tablet 0   No current facility-administered medications for this visit.    PHYSICAL EXAMINATION: ECOG PERFORMANCE STATUS: 0 - Asymptomatic  There were no vitals filed for this visit. Wt Readings from Last 3 Encounters:  05/25/22 221 lb 1.6 oz (100.3 kg)  04/27/22 220 lb 2 oz (99.8 kg)  03/23/22 220 lb 2 oz (99.8 kg)     No vitals taken today, Exam not performed today  LABORATORY DATA:  I have reviewed the data as listed    Latest Ref Rng & Units 05/25/2022    9:38 AM 03/12/2022    2:20 PM 02/24/2022   11:17 AM  CBC  WBC 4.0 - 10.5 K/uL 4.8  4.9  5.3   Hemoglobin 12.0 - 15.0 g/dL 11.4  10.6  12.0   Hematocrit 36.0 - 46.0 % 34.7  31.9  36.4   Platelets 150 - 400 K/uL 364  300  366.0         Latest Ref Rng & Units 05/25/2022    9:38 AM 03/12/2022    2:20 PM 02/24/2022   11:17 AM  CMP  Glucose 70 - 99 mg/dL 140  95  117   BUN 6 - 20 mg/dL _0 Creatinine 0.44 - 1.00 mg/dL 1.09  0.81  0.82   Sodium 135 - 145 mmol/L 140  142  138   Potassium 3.5 - 5.1 mmol/L 3.6  3.5  3.7   Chloride 98 - 111 mmol/L 104   110  101   CO2 22 - 32 mmol/L _1 Calcium 8.9 - 10.3 mg/dL 9.9  9.7  10.2   Total Protein 6.5 - 8.1 g/dL 7.4  7.2  7.8   Total Bilirubin 0.3 - 1.2 mg/dL 0.7  0.7  0.8  Alkaline Phos 38 - 126 U/L 86  65  72   AST 15 - 41 U/L _0 ALT 0 - 44 U/L _1 RADIOGRAPHIC STUDIES: I have personally reviewed the radiological images as listed and agreed with the findings in the report. No results found.    No orders of the defined types were placed in this encounter.  All questions were answered. The patient knows to call the clinic with any problems, questions or concerns. No barriers to learning was detected. The total time spent in the appointment was 15 minutes.     Truitt Merle, MD 06/10/2022   I, Wilburn Mylar, am acting as scribe for Truitt Merle, MD.   I have reviewed the above documentation for accuracy and completeness, and I agree with the above.

## 2022-06-11 ENCOUNTER — Telehealth: Payer: Self-pay

## 2022-06-11 NOTE — Telephone Encounter (Signed)
-----   Message from Irving Copas., MD sent at 06/10/2022 11:39 PM EST ----- YF, Thanks for forwarding me this. Overall looks very stable compared to previous MRI and to my EUS.  Thus I do think it is reasonable to plan a 31-monthfollow-up MRI/MRCP.  I wanted to her to have a repeat EGD to ensure healing of her gastric ulcer and I will have my team work on getting that scheduled at her convenience. Thanks. GM  Roderick Sweezy, This patient needs an EGD for gastric ulcer healing/response to PPI.  Please schedule her as able.  Should she want to be seen in clinic then schedule her with me. Thanks. GM ----- Message ----- From: FTruitt Merle MD Sent: 06/09/2022  11:26 AM EST To: GIrving Copas, MD  Gabe,  When you get a chance, please review her MRI and let me know if you plan to repeat EUS or just f/u MRI in 6 (?) months.  YKrista Blue ----- Message ----- From: IBuel Ream Rad Results In Sent: 06/08/2022   1:46 PM EST To: YTruitt Merle MD

## 2022-06-12 ENCOUNTER — Other Ambulatory Visit: Payer: Self-pay

## 2022-06-12 DIAGNOSIS — K259 Gastric ulcer, unspecified as acute or chronic, without hemorrhage or perforation: Secondary | ICD-10-CM

## 2022-06-12 NOTE — Telephone Encounter (Signed)
The pt has been scheduled for EGD on 07/13/22 at 130 pm at [facility] with GM   Left message on machine to call back

## 2022-06-15 DIAGNOSIS — M25562 Pain in left knee: Secondary | ICD-10-CM | POA: Diagnosis not present

## 2022-06-15 NOTE — Telephone Encounter (Signed)
EGD scheduled, pt instructed and medications reviewed.  Patient instructions mailed to home.  Patient to call with any questions or concerns.  

## 2022-06-22 ENCOUNTER — Ambulatory Visit: Payer: Federal, State, Local not specified - PPO

## 2022-06-23 DIAGNOSIS — M25562 Pain in left knee: Secondary | ICD-10-CM | POA: Diagnosis not present

## 2022-07-01 ENCOUNTER — Ambulatory Visit
Admission: RE | Admit: 2022-07-01 | Discharge: 2022-07-01 | Disposition: A | Discharge status: Home / Self Care | Payer: Federal, State, Local not specified - PPO | Source: Ambulatory Visit | Attending: Hematology | Admitting: Hematology

## 2022-07-01 DIAGNOSIS — Z170 Estrogen receptor positive status [ER+]: Secondary | ICD-10-CM

## 2022-07-01 DIAGNOSIS — Z853 Personal history of malignant neoplasm of breast: Secondary | ICD-10-CM | POA: Diagnosis not present

## 2022-07-06 ENCOUNTER — Encounter (HOSPITAL_COMMUNITY): Payer: Self-pay | Admitting: Gastroenterology

## 2022-07-13 ENCOUNTER — Ambulatory Visit (HOSPITAL_COMMUNITY): Payer: Federal, State, Local not specified - PPO | Admitting: Certified Registered Nurse Anesthetist

## 2022-07-13 ENCOUNTER — Ambulatory Visit (HOSPITAL_COMMUNITY)
Admission: RE | Admit: 2022-07-13 | Discharge: 2022-07-13 | Disposition: A | Discharge status: Home / Self Care | Payer: Federal, State, Local not specified - PPO | Attending: Gastroenterology | Admitting: Gastroenterology

## 2022-07-13 ENCOUNTER — Encounter (HOSPITAL_COMMUNITY): Payer: Self-pay | Admitting: Gastroenterology

## 2022-07-13 ENCOUNTER — Encounter (HOSPITAL_COMMUNITY)
Admission: RE | Disposition: A | Discharge status: Home / Self Care | Payer: Self-pay | Source: Home / Self Care | Attending: Gastroenterology

## 2022-07-13 DIAGNOSIS — K449 Diaphragmatic hernia without obstruction or gangrene: Secondary | ICD-10-CM | POA: Diagnosis not present

## 2022-07-13 DIAGNOSIS — K259 Gastric ulcer, unspecified as acute or chronic, without hemorrhage or perforation: Secondary | ICD-10-CM | POA: Diagnosis not present

## 2022-07-13 DIAGNOSIS — K3189 Other diseases of stomach and duodenum: Secondary | ICD-10-CM | POA: Diagnosis not present

## 2022-07-13 DIAGNOSIS — I10 Essential (primary) hypertension: Secondary | ICD-10-CM | POA: Diagnosis not present

## 2022-07-13 DIAGNOSIS — F418 Other specified anxiety disorders: Secondary | ICD-10-CM | POA: Diagnosis not present

## 2022-07-13 HISTORY — PX: ESOPHAGOGASTRODUODENOSCOPY (EGD) WITH PROPOFOL: SHX5813

## 2022-07-13 SURGERY — ESOPHAGOGASTRODUODENOSCOPY (EGD) WITH PROPOFOL
Anesthesia: Monitor Anesthesia Care

## 2022-07-13 MED ORDER — LACTATED RINGERS IV SOLN: INTRAVENOUS | Status: DC | PRN

## 2022-07-13 MED ORDER — PROPOFOL 500 MG/50ML IV EMUL
INTRAVENOUS | Status: DC | PRN
  Administered 2022-07-13: 150 ug/kg/min via INTRAVENOUS

## 2022-07-13 MED ORDER — HEPARIN SOD (PORK) LOCK FLUSH 100 UNIT/ML IV SOLN
500.0000 [IU] | INTRAVENOUS | Status: AC | PRN
  Administered 2022-07-13: 500 [IU]
  Filled 2022-07-13: qty 5

## 2022-07-13 MED ORDER — SODIUM CHLORIDE 0.9 % IV SOLN: INTRAVENOUS | Status: DC

## 2022-07-13 MED ORDER — OMEPRAZOLE 40 MG PO CPDR: 40.0000 mg | DELAYED_RELEASE_CAPSULE | Freq: Every day | ORAL | 6 refills | Status: DC

## 2022-07-13 SURGICAL SUPPLY — 15 items

## 2022-07-13 NOTE — Op Note (Signed)
[facility] Patient Name: Joanna Osborn Procedure Date: 07/13/2022 MRN: [id number] Attending MD: Justice Britain , MD, [id number] Date of Birth: March 21, 1971 CSN: [id number] Age: 51 Admit Type: Outpatient Procedure:                Upper GI endoscopy Indications:              Follow-up of gastric ulcer Providers:                Justice Britain, MD, Burtis Junes, RN, Darliss Cheney,                            Technician, Herbie Drape, CRNA Referring MD:             Truitt Merle, Noelle Redmon Medicines:                Monitored Anesthesia Care Complications:            No immediate complications. Estimated Blood Loss:     Estimated blood loss: none. Procedure:                Pre-Anesthesia Assessment:                           - Prior to the procedure, a History and Physical                            was performed, and patient medications and                            allergies were reviewed. The patient's tolerance of                            previous anesthesia was also reviewed. The risks                            and benefits of the procedure and the sedation                            options and risks were discussed with the patient.                            All questions were answered, and informed consent                            was obtained. Prior Anticoagulants: The patient has                            taken no anticoagulant or antiplatelet agents. ASA                            Grade Assessment: II - A patient with mild systemic                            disease. After reviewing the risks and benefits,  the patient was deemed in satisfactory condition to                            undergo the procedure.                           After obtaining informed consent, the endoscope was                            passed under direct vision. Throughout the                            procedure, the patient's blood pressure, pulse, and                             oxygen saturations were monitored continuously. The                            [id number]-H190 ([id number]) Olympus endoscope was introduced                            through the mouth, and advanced to the second part                            of duodenum. The upper GI endoscopy was                            accomplished without difficulty. The patient                            tolerated the procedure. Scope In: Scope Out: Findings:      No gross lesions were noted in the entire esophagus.      The Z-line was regular and was found 35 cm from the incisors.      A 4 cm hiatal hernia was present.      A small healed ulcer was found in the gastric antrum.      No other gross lesions were noted in the entire examined stomach.      No gross lesions were noted in the duodenal bulb, in the first portion       of the duodenum and in the second portion of the duodenum. Impression:               - No gross lesions in the entire esophagus. Z-line                            regular, 35 cm from the incisors.                           - 4 cm hiatal hernia.                           - Scar in the gastric antrum from previous ulcer  that is healed. No other gross lesions in the                            entire stomach.                           - No gross lesions in the duodenal bulb, in the                            first portion of the duodenum and in the second                            portion of the duodenum. Moderate Sedation:      Not Applicable - Patient had care per Anesthesia. Recommendation:           - The patient will be observed post-procedure,                            until all discharge criteria are met.                           - Discharge patient to home.                           - Patient has a contact number available for                            emergencies. The signs and symptoms of potential                            delayed  complications were discussed with the                            patient. Return to normal activities tomorrow.                            Written discharge instructions were provided to the                            patient.                           - Resume previous diet.                           - May decrease PPI to once daily.                           - Minimize nonsteroidal use as able.                           - Observe patient's clinical course.                           - The findings and recommendations were discussed  with the patient.                           - The findings and recommendations were discussed                            with the designated responsible adult. Procedure Code(s):        --- Professional ---                           [phone], Esophagogastroduodenoscopy, flexible,                            transoral; diagnostic, including collection of                            specimen(s) by brushing or washing, when performed                            (separate procedure) Diagnosis Code(s):        --- Professional ---                           K44.9, Diaphragmatic hernia without obstruction or                            gangrene                           K31.89, Other diseases of stomach and duodenum                           K25.9, Gastric ulcer, unspecified as acute or                            chronic, without hemorrhage or perforation CPT copyright 2022 American Medical Association. All rights reserved. The codes documented in this report are preliminary and upon coder review may  be revised to meet current compliance requirements. Justice Britain, MD 07/13/2022 1:10:20 PM Number of Addenda: 0

## 2022-07-13 NOTE — Anesthesia Preprocedure Evaluation (Signed)
Anesthesia Evaluation  Patient identified by MRN, date of birth, ID band Patient awake    Reviewed: Allergy & Precautions, NPO status , Patient's Chart, lab work & pertinent test results  Airway Mallampati: II  TM Distance: >3 FB Neck ROM: Full    Dental no notable dental hx.    Pulmonary neg pulmonary ROS   Pulmonary exam normal        Cardiovascular hypertension, Pt. on medications  Rhythm:Regular Rate:Normal     Neuro/Psych   Anxiety Depression    negative neurological ROS     GI/Hepatic Neg liver ROS, PUD,,,  Endo/Other  negative endocrine ROS    Renal/GU negative Renal ROS  negative genitourinary   Musculoskeletal negative musculoskeletal ROS (+)    Abdominal Normal abdominal exam  (+)   Peds  Hematology  (+) Blood dyscrasia, anemia   Anesthesia Other Findings   Reproductive/Obstetrics                             Anesthesia Physical Anesthesia Plan  ASA: 2  Anesthesia Plan: MAC   Post-op Pain Management:    Induction: Intravenous  PONV Risk Score and Plan: 2 and Propofol infusion and Treatment may vary due to age or medical condition  Airway Management Planned: Simple Face Mask, Natural Airway and Nasal Cannula  Additional Equipment: None  Intra-op Plan:   Post-operative Plan:   Informed Consent: I have reviewed the patients History and Physical, chart, labs and discussed the procedure including the risks, benefits and alternatives for the proposed anesthesia with the patient or authorized representative who has indicated his/her understanding and acceptance.     Dental advisory given  Plan Discussed with:   Anesthesia Plan Comments:        Anesthesia Quick Evaluation

## 2022-07-13 NOTE — Anesthesia Postprocedure Evaluation (Signed)
Anesthesia Post Note  Patient: Joanna Osborn  Procedure(s) Performed: ESOPHAGOGASTRODUODENOSCOPY (EGD) WITH PROPOFOL     Patient location during evaluation: Endoscopy Anesthesia Type: MAC Level of consciousness: awake and alert Pain management: pain level controlled Vital Signs Assessment: post-procedure vital signs reviewed and stable Respiratory status: spontaneous breathing, nonlabored ventilation, respiratory function stable and patient connected to nasal cannula oxygen Cardiovascular status: stable and blood pressure returned to baseline Postop Assessment: no apparent nausea or vomiting Anesthetic complications: no   No notable events documented.  Last Vitals:  Vitals:   07/13/22 1322 07/13/22 1330  BP: 113/72 124/86  Pulse: 81 78  Resp: 17 16  Temp:    SpO2: 98% 96%    Last Pain:  Vitals:   07/13/22 1330  TempSrc:   PainSc: 0-No pain                 Belenda Cruise P Ioannis Schuh

## 2022-07-13 NOTE — Transfer of Care (Signed)
Immediate Anesthesia Transfer of Care Note  Patient: Joanna Osborn  Procedure(s) Performed: ESOPHAGOGASTRODUODENOSCOPY (EGD) WITH PROPOFOL  Patient Location: PACU  Anesthesia Type:MAC  Level of Consciousness: sedated, patient cooperative, and responds to stimulation  Airway & Oxygen Therapy: Patient Spontanous Breathing and Patient connected to face mask oxygen  Post-op Assessment: Report given to RN and Post -op Vital signs reviewed and stable  Post vital signs: Reviewed and stable  Last Vitals:  Vitals Value Taken Time  BP    Temp    Pulse    Resp    SpO2      Last Pain:  Vitals:   07/13/22 1212  TempSrc: Temporal  PainSc: 0-No pain         Complications: No notable events documented.

## 2022-07-13 NOTE — H&P (Signed)
GASTROENTEROLOGY PROCEDURE H&P NOTE   Primary Care Physician: Lennie Odor, PA  HPI: Joanna Osborn is a 51 y.o. female who presents for EGD for follow-up of gastric ulcer.  Past Medical History:  Diagnosis Date   Anemia    Anxiety    Chemotherapy-induced peripheral neuropathy ([place])    per pt toes   Depression    Family history of colon cancer 03/19/2021   History of chemotherapy    left breast cancer  04-04-2021  to 07-26-2021 and restarted 08-14-2021   History of external beam radiation therapy    left breast cancer  10-02-2021  to 11-18-2021   History of kidney stones    Hypertension    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   Malignant neoplasm of upper-outer quadrant of left breast in female, estrogen receptor positive ([place]) 03/13/2021   oncologist--- dr Burr Medico;  Stage IB, IDC, ER/ PR/ HER2 +;   chemo 04-04-2021  to 07-26-2021 and started 08-14-2021;  raditation completed 11-18-2021   Port-A-Cath in place 04/02/2021   Wears glasses    Past Surgical History:  Procedure Laterality Date   BIOPSY  02/19/2022   Procedure: BIOPSY;  Surgeon: Rush Landmark Telford Nab., MD;  Location: [facility] ENDOSCOPY;  Service: Gastroenterology;;   BREAST LUMPECTOMY WITH RADIOACTIVE SEED AND SENTINEL LYMPH NODE BIOPSY Left 08/25/2021   Procedure: LEFT BREAST LUMPECTOMY WITH RADIOACTIVE SEED AND AXILLARY SENTINEL LYMPH NODE BIOPSY;  Surgeon: Rolm Bookbinder, MD;  Location: [place];  Service: General;  Laterality: Left;   CARPAL TUNNEL RELEASE Right 08/17/2008   _0  by dr Fredna Dow;   and excision wrist ganglian cyst   CHOLECYSTECTOMY, LAPAROSCOPIC  06/27/2002   _1  by dr Bubba Camp   ESOPHAGOGASTRODUODENOSCOPY (EGD) WITH PROPOFOL N/A 02/19/2022   Procedure: ESOPHAGOGASTRODUODENOSCOPY (EGD) WITH PROPOFOL;  Surgeon: Irving Copas., MD;  Location: [place];  Service: Gastroenterology;  Laterality: N/A;   EUS N/A 02/19/2022   Procedure: UPPER ENDOSCOPIC ULTRASOUND (EUS) RADIAL;  Surgeon:  Irving Copas., MD;  Location: [place];  Service: Gastroenterology;  Laterality: N/A;   FINE NEEDLE ASPIRATION  02/19/2022   Procedure: FINE NEEDLE ASPIRATION (FNA) LINEAR;  Surgeon: Irving Copas., MD;  Location: [place];  Service: Gastroenterology;;   HYSTEROSCOPY WITH D & C  11/21/2007   _2  by dr Julien Girt;  polypectomy   INSERTION OF MESH N/A 10/28/2016   Procedure: INSERTION OF MESH;  Surgeon: Clovis [name], MD;  Location: [place];  Service: General;  Laterality: N/A;   LAPAROSCOPIC SALPINGO OOPHERECTOMY Bilateral 02/02/2022   Procedure: LAPAROSCOPIC BILATERAL SALPINGO OOPHORECTOMY;  Surgeon: Molli Posey, MD;  Location: [facility];  Service: Gynecology;  Laterality: Bilateral;   OOPHORECTOMY     PORTACATH PLACEMENT Right 04/02/2021   Procedure: INSERTION PORT-A-CATH;  Surgeon: Rolm Bookbinder, MD;  Location: [place];  Service: General;  Laterality: Right;   [place] N/A 10/28/2016   Procedure: LAPAROSCOPIC VENTRAL HERNIA REPAIR;  Surgeon: Clovis [name], MD;  Location: [place];  Service: General;  Laterality: N/A;   Current Facility-Administered Medications  Medication Dose Route Frequency Provider Last Rate Last Admin   0.9 %  sodium chloride infusion   Intravenous Continuous Mansouraty, Telford Nab., MD        Current Facility-Administered Medications:    0.9 %  sodium chloride infusion, , Intravenous, Continuous, Mansouraty, Telford Nab., MD Allergies  Allergen Reactions   Endocet [Oxycodone-Acetaminophen] Itching   Propoxyphene Itching and Other (See Comments)  Darvocet*   Tape Rash   Family History  Problem Relation Age of Onset   Kidney disease Mother    Colon cancer Paternal Aunt    Colon cancer Paternal Grandmother        dx unknown age   Colon cancer Paternal Grandfather        dx after 98   Breast cancer Cousin    Hypertension Other    Cancer Other     Social History   Socioeconomic History   Marital status: Married    Spouse name: Not on file   Number of children: 2   Years of education: Not on file   Highest education level: Not on file  Occupational History   Not on file  Tobacco Use   Smoking status: Never   Smokeless tobacco: Never  Vaping Use   Vaping Use: Never used  Substance and Sexual Activity   Alcohol use: Not Currently    Comment: one glass wine twice monthly   Drug use: Never   Sexual activity: Yes    Birth control/protection: None  Other Topics Concern   Not on file  Social History Narrative   Not on file   Social Determinants of Health   Financial Resource Strain: Not on file  Food Insecurity: No Food Insecurity (03/19/2021)   Hunger Vital Sign    Worried About Running Out of Food in the Last Year: Never true    Ran Out of Food in the Last Year: Never true  Transportation Needs: No Transportation Needs (03/19/2021)   PRAPARE - Hydrologist (Medical): No    Lack of Transportation (Non-Medical): No  Physical Activity: Not on file  Stress: Not on file  Social Connections: Not on file  Intimate Partner Violence: Not on file    Physical Exam: Today's Vitals   07/06/22 1619 07/13/22 1212  BP:  (!) 149/99  Pulse:  93  Resp:  20  Temp:  98.3 F (36.8 C)  TempSrc:  Temporal  SpO2:  96%  Weight: 102.1 kg 102.1 kg  Height:  _0  (1.651 m)  PainSc:  0-No pain   Body mass index is 37.44 kg/m. GEN: NAD EYE: Sclerae anicteric ENT: MMM CV: Non-tachycardic GI: Soft, NT/ND NEURO:  Alert & Oriented x 3  Lab Results: No results for input(s): "WBC", "HGB", "HCT", "PLT" in the last 72 hours. BMET No results for input(s): "NA", "K", "CL", "CO2", "GLUCOSE", "BUN", "CREATININE", "CALCIUM" in the last 72 hours. LFT No results for input(s): "PROT", "ALBUMIN", "AST", "ALT", "ALKPHOS", "BILITOT", "BILIDIR", "IBILI" in the last 72 hours. PT/INR No results for input(s):  "LABPROT", "INR" in the last 72 hours.   Impression / Plan: This is a 51 y.o.female who presents for EGD for follow-up of gastric ulcer.  The risks and benefits of endoscopic evaluation/treatment were discussed with the patient and/or family; these include but are not limited to the risk of perforation, infection, bleeding, missed lesions, lack of diagnosis, severe illness requiring hospitalization, as well as anesthesia and sedation related illnesses.  The patient's history has been reviewed, patient examined, no change in status, and deemed stable for procedure.  The patient and/or family is agreeable to proceed.    Justice Britain, MD [facility] Office # [phone]

## 2022-07-13 NOTE — Discharge Instructions (Signed)
YOU HAD AN ENDOSCOPIC PROCEDURE TODAY: Refer to the procedure report and other information in the discharge instructions given to you for any specific questions about what was found during the examination. If this information does not answer your questions, please call [place] office at [phone] to clarify.  ° °YOU SHOULD EXPECT: Some feelings of bloating in the abdomen. Passage of more gas than usual. Walking can help get rid of the air that was put into your GI tract during the procedure and reduce the bloating.  ° °DIET: Your first meal following the procedure should be a light meal and then it is ok to progress to your normal diet. A half-sandwich or bowl of soup is an example of a good first meal. Heavy or fried foods are harder to digest and may make you feel nauseous or bloated. Drink plenty of fluids but you should avoid alcoholic beverages for 24 hours. ° °ACTIVITY: Your care partner should take you home directly after the procedure. You should plan to take it easy, moving slowly for the rest of the day. You can resume normal activity the day after the procedure however YOU SHOULD NOT DRIVE, use power tools, machinery or perform tasks that involve climbing or major physical exertion for 24 hours (because of the sedation medicines used during the test).  ° °SYMPTOMS TO REPORT IMMEDIATELY: °A gastroenterologist can be reached at any hour. Please call [phone]  for any of the following symptoms:  ° °Following upper endoscopy (EGD, EUS, ERCP, esophageal dilation) °Vomiting of blood or coffee ground material  °New, significant abdominal pain  °New, significant chest pain or pain under the shoulder blades  °Painful or persistently difficult swallowing  °New shortness of breath  °Black, tarry-looking or red, bloody stools ° °FOLLOW UP:  °If any biopsies were taken you will be contacted by phone or by letter within the next 1-3 weeks. Call [phone]  if you have not heard about the biopsies in 3 weeks.    °Please also call with any specific questions about appointments or follow up tests. ° °

## 2022-07-20 ENCOUNTER — Encounter (HOSPITAL_COMMUNITY): Payer: Self-pay | Admitting: Gastroenterology

## 2022-07-31 ENCOUNTER — Telehealth: Payer: Self-pay | Admitting: *Deleted

## 2022-07-31 NOTE — Telephone Encounter (Signed)
Per Dr. Burr Medico port may be remove. Msg sent to Dr. Donne Hazel and his office for scheduling purposes.

## 2022-08-03 ENCOUNTER — Ambulatory Visit: Payer: Federal, State, Local not specified - PPO | Attending: General Surgery

## 2022-08-03 VITALS — Wt 226.2 lb

## 2022-08-03 DIAGNOSIS — Z483 Aftercare following surgery for neoplasm: Secondary | ICD-10-CM

## 2022-08-03 NOTE — Therapy (Signed)
OUTPATIENT PHYSICAL THERAPY SOZO SCREENING NOTE   Patient Name: Joanna Osborn MRN: [id number] DOB:1971-02-04, 52 y.o., female Today's Date: 08/03/2022  PCP: Lennie Odor, PA REFERRING PROVIDER: Rolm Bookbinder, MD   PT End of Session - 08/03/22 1027     Visit Number 7   # unchanged due to screen only   PT Start Time 1025    PT Stop Time 1030    PT Time Calculation (min) 5 min    Activity Tolerance Patient tolerated treatment well    Behavior During Therapy [facility] for tasks assessed/performed             Past Medical History:  Diagnosis Date   Anemia    Anxiety    Chemotherapy-induced peripheral neuropathy ([place])    per pt toes   Depression    Family history of colon cancer 03/19/2021   History of chemotherapy    left breast cancer  04-04-2021  to 07-26-2021 and restarted 08-14-2021   History of external beam radiation therapy    left breast cancer  10-02-2021  to 11-18-2021   History of kidney stones    Hypertension    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   Malignant neoplasm of upper-outer quadrant of left breast in female, estrogen receptor positive ([place]) 03/13/2021   oncologist--- dr Burr Medico;  Stage IB, IDC, ER/ PR/ HER2 +;   chemo 04-04-2021  to 07-26-2021 and started 08-14-2021;  raditation completed 11-18-2021   Port-A-Cath in place 04/02/2021   Wears glasses    Past Surgical History:  Procedure Laterality Date   BIOPSY  02/19/2022   Procedure: BIOPSY;  Surgeon: Rush Landmark Telford Nab., MD;  Location: [facility] ENDOSCOPY;  Service: Gastroenterology;;   BREAST LUMPECTOMY WITH RADIOACTIVE SEED AND SENTINEL LYMPH NODE BIOPSY Left 08/25/2021   Procedure: LEFT BREAST LUMPECTOMY WITH RADIOACTIVE SEED AND AXILLARY SENTINEL LYMPH NODE BIOPSY;  Surgeon: Rolm Bookbinder, MD;  Location: [place];  Service: General;  Laterality: Left;   CARPAL TUNNEL RELEASE Right 08/17/2008   '@[facility]'$  by dr Fredna Dow;   and excision wrist ganglian cyst   CHOLECYSTECTOMY, LAPAROSCOPIC   06/27/2002   '@[facility]'$  by dr Bubba Camp   ESOPHAGOGASTRODUODENOSCOPY (EGD) WITH PROPOFOL N/A 02/19/2022   Procedure: ESOPHAGOGASTRODUODENOSCOPY (EGD) WITH PROPOFOL;  Surgeon: Irving Copas., MD;  Location: [facility] ENDOSCOPY;  Service: Gastroenterology;  Laterality: N/A;   ESOPHAGOGASTRODUODENOSCOPY (EGD) WITH PROPOFOL N/A 07/13/2022   Procedure: ESOPHAGOGASTRODUODENOSCOPY (EGD) WITH PROPOFOL;  Surgeon: Rush Landmark Telford Nab., MD;  Location: [facility] ENDOSCOPY;  Service: Gastroenterology;  Laterality: N/A;   EUS N/A 02/19/2022   Procedure: UPPER ENDOSCOPIC ULTRASOUND (EUS) RADIAL;  Surgeon: Irving Copas., MD;  Location: [place];  Service: Gastroenterology;  Laterality: N/A;   FINE NEEDLE ASPIRATION  02/19/2022   Procedure: FINE NEEDLE ASPIRATION (FNA) LINEAR;  Surgeon: Irving Copas., MD;  Location: [place];  Service: Gastroenterology;;   HYSTEROSCOPY WITH D & C  11/21/2007   '@[facility]'$  by dr Julien Girt;  polypectomy   INSERTION OF MESH N/A 10/28/2016   Procedure: INSERTION OF MESH;  Surgeon: Clovis [name], MD;  Location: [place];  Service: General;  Laterality: N/A;   LAPAROSCOPIC SALPINGO OOPHERECTOMY Bilateral 02/02/2022   Procedure: LAPAROSCOPIC BILATERAL SALPINGO OOPHORECTOMY;  Surgeon: Molli Posey, MD;  Location: [facility];  Service: Gynecology;  Laterality: Bilateral;   OOPHORECTOMY     PORTACATH PLACEMENT Right 04/02/2021   Procedure: INSERTION PORT-A-CATH;  Surgeon: Rolm Bookbinder, MD;  Location: [place];  Service: General;  Laterality: Right;   TUBAL  LIGATION  1999   VENTRAL HERNIA REPAIR N/A 10/28/2016   Procedure: LAPAROSCOPIC VENTRAL HERNIA REPAIR;  Surgeon: Clovis [name], MD;  Location: [place];  Service: General;  Laterality: N/A;   Patient Active Problem List   Diagnosis Date Noted   Left breast abscess 09/12/2021   Essential hypertension 09/12/2021   Lesion of pancreas 09/12/2021   Hypokalemia 09/12/2021   Genetic  testing 04/08/2021   Port-A-Cath in place 04/03/2021   Family history of colon cancer 03/19/2021   Malignant neoplasm of upper-outer quadrant of left breast in female, estrogen receptor positive ([place]) 03/13/2021   Suicide ideation 07/07/2013   Major depressive disorder, recurrent severe without psychotic features ([place]) 07/06/2013    REFERRING DIAG: left breast cancer at risk for lymphedema  THERAPY DIAG: Aftercare following surgery for neoplasm  PERTINENT HISTORY: Patient was diagnosed on 03/05/2021 with left grade III invasive ductal carcinoma breast cancer. She underwent neoadjuvant chemotherapy from 04/03/2021-07/24/2021. She had a left lumpectomy and sentinel node biopsy (2 negative nodes) on 08/25/2021. It is triple positive with a Ki67 of 40%.   PRECAUTIONS: left UE Lymphedema risk, None  SUBJECTIVE: Pt returns for her 1 month reassess after having a change of baseline higher than 6.5 which indicates subclinical lymphedema.  "I've worn my compression sleeve almost every day."  PAIN:  Are you having pain? No  SOZO SCREENING: Patient was assessed today using the SOZO machine to determine the lymphedema index score. This was compared to her baseline score. It was determined that she is within the recommended range when compared to her baseline and no further action is needed at this time. She will continue SOZO screenings. These are done every 3 months for 2 years post operatively followed by every 6 months for 2 years, and then annually.  Did encourage pt to cont wear of compression sleeve during periods of highly repetitive activities, when increasing her HR (I.e. walking) and/or flying. Pt verbalized good understanding and was agreeable to this.     L-DEX FLOWSHEETS - 08/03/22 1000       L-DEX LYMPHEDEMA SCREENING   Measurement Type Unilateral    L-DEX MEASUREMENT EXTREMITY Upper Extremity    POSITION  Standing    DOMINANT SIDE Right    At Risk Side Left    BASELINE SCORE  (UNILATERAL) -2.7    L-DEX SCORE (UNILATERAL) -0.1    VALUE CHANGE (UNILAT) 2.6              Otelia Limes, PTA 08/03/2022, 10:30 AM

## 2022-08-11 ENCOUNTER — Other Ambulatory Visit: Payer: Self-pay | Admitting: General Surgery

## 2022-08-17 ENCOUNTER — Encounter (HOSPITAL_BASED_OUTPATIENT_CLINIC_OR_DEPARTMENT_OTHER): Payer: Self-pay | Admitting: General Surgery

## 2022-08-17 ENCOUNTER — Other Ambulatory Visit: Payer: Self-pay

## 2022-08-19 ENCOUNTER — Other Ambulatory Visit: Payer: Self-pay | Admitting: Gastroenterology

## 2022-08-24 ENCOUNTER — Ambulatory Visit (HOSPITAL_BASED_OUTPATIENT_CLINIC_OR_DEPARTMENT_OTHER): Payer: Federal, State, Local not specified - PPO | Admitting: Anesthesiology

## 2022-08-24 ENCOUNTER — Other Ambulatory Visit: Payer: Self-pay

## 2022-08-24 ENCOUNTER — Encounter (HOSPITAL_BASED_OUTPATIENT_CLINIC_OR_DEPARTMENT_OTHER)
Admission: RE | Disposition: A | Discharge status: Home / Self Care | Payer: Self-pay | Source: Home / Self Care | Attending: General Surgery

## 2022-08-24 ENCOUNTER — Encounter (HOSPITAL_BASED_OUTPATIENT_CLINIC_OR_DEPARTMENT_OTHER): Payer: Self-pay | Admitting: General Surgery

## 2022-08-24 ENCOUNTER — Ambulatory Visit (HOSPITAL_BASED_OUTPATIENT_CLINIC_OR_DEPARTMENT_OTHER)
Admission: RE | Admit: 2022-08-24 | Discharge: 2022-08-24 | Disposition: A | Discharge status: Home / Self Care | Payer: Federal, State, Local not specified - PPO | Attending: General Surgery | Admitting: General Surgery

## 2022-08-24 DIAGNOSIS — Z452 Encounter for adjustment and management of vascular access device: Secondary | ICD-10-CM | POA: Diagnosis not present

## 2022-08-24 DIAGNOSIS — C50919 Malignant neoplasm of unspecified site of unspecified female breast: Secondary | ICD-10-CM | POA: Diagnosis not present

## 2022-08-24 DIAGNOSIS — F32A Depression, unspecified: Secondary | ICD-10-CM | POA: Diagnosis not present

## 2022-08-24 DIAGNOSIS — Z01818 Encounter for other preprocedural examination: Secondary | ICD-10-CM

## 2022-08-24 DIAGNOSIS — F419 Anxiety disorder, unspecified: Secondary | ICD-10-CM | POA: Diagnosis not present

## 2022-08-24 DIAGNOSIS — Z9221 Personal history of antineoplastic chemotherapy: Secondary | ICD-10-CM | POA: Insufficient documentation

## 2022-08-24 DIAGNOSIS — C50412 Malignant neoplasm of upper-outer quadrant of left female breast: Secondary | ICD-10-CM | POA: Insufficient documentation

## 2022-08-24 DIAGNOSIS — I10 Essential (primary) hypertension: Secondary | ICD-10-CM | POA: Diagnosis not present

## 2022-08-24 DIAGNOSIS — K279 Peptic ulcer, site unspecified, unspecified as acute or chronic, without hemorrhage or perforation: Secondary | ICD-10-CM | POA: Diagnosis not present

## 2022-08-24 DIAGNOSIS — D649 Anemia, unspecified: Secondary | ICD-10-CM | POA: Diagnosis not present

## 2022-08-24 DIAGNOSIS — F418 Other specified anxiety disorders: Secondary | ICD-10-CM | POA: Diagnosis not present

## 2022-08-24 DIAGNOSIS — Z170 Estrogen receptor positive status [ER+]: Secondary | ICD-10-CM | POA: Insufficient documentation

## 2022-08-24 DIAGNOSIS — D630 Anemia in neoplastic disease: Secondary | ICD-10-CM | POA: Diagnosis not present

## 2022-08-24 HISTORY — PX: PORT-A-CATH REMOVAL: SHX5289

## 2022-08-24 SURGERY — REMOVAL PORT-A-CATH
Anesthesia: Monitor Anesthesia Care | Site: Chest | Laterality: Right

## 2022-08-24 MED ORDER — ENSURE PRE-SURGERY PO LIQD: 296.0000 mL | Freq: Once | ORAL | Status: DC

## 2022-08-24 MED ORDER — PROPOFOL 500 MG/50ML IV EMUL
INTRAVENOUS | Status: DC | PRN
  Administered 2022-08-24: 75 ug/kg/min via INTRAVENOUS

## 2022-08-24 MED ORDER — MEPERIDINE HCL 25 MG/ML IJ SOLN: 6.2500 mg | INTRAMUSCULAR | Status: DC | PRN

## 2022-08-24 MED ORDER — ACETAMINOPHEN 325 MG PO TABS: 325.0000 mg | ORAL_TABLET | ORAL | Status: DC | PRN

## 2022-08-24 MED ORDER — ONDANSETRON HCL 4 MG/2ML IJ SOLN
INTRAMUSCULAR | Status: DC | PRN
  Administered 2022-08-24: 4 mg via INTRAVENOUS

## 2022-08-24 MED ORDER — LIDOCAINE HCL (CARDIAC) PF 100 MG/5ML IV SOSY
PREFILLED_SYRINGE | INTRAVENOUS | Status: DC | PRN
  Administered 2022-08-24: 60 mg via INTRAVENOUS

## 2022-08-24 MED ORDER — FENTANYL CITRATE (PF) 100 MCG/2ML IJ SOLN: 25.0000 ug | INTRAMUSCULAR | Status: DC | PRN

## 2022-08-24 MED ORDER — ONDANSETRON HCL 4 MG/2ML IJ SOLN
INTRAMUSCULAR | Status: AC
  Filled 2022-08-24: qty 2

## 2022-08-24 MED ORDER — FENTANYL CITRATE (PF) 100 MCG/2ML IJ SOLN
INTRAMUSCULAR | Status: DC | PRN
  Administered 2022-08-24: 50 ug via INTRAVENOUS

## 2022-08-24 MED ORDER — MIDAZOLAM HCL 5 MG/5ML IJ SOLN
INTRAMUSCULAR | Status: DC | PRN
  Administered 2022-08-24: 2 mg via INTRAVENOUS

## 2022-08-24 MED ORDER — ONDANSETRON HCL 4 MG/2ML IJ SOLN: 4.0000 mg | Freq: Once | INTRAMUSCULAR | Status: DC | PRN

## 2022-08-24 MED ORDER — ACETAMINOPHEN 500 MG PO TABS
ORAL_TABLET | ORAL | Status: AC
  Filled 2022-08-24: qty 2

## 2022-08-24 MED ORDER — BUPIVACAINE HCL (PF) 0.25 % IJ SOLN
INTRAMUSCULAR | Status: DC | PRN
  Administered 2022-08-24: 10 mL

## 2022-08-24 MED ORDER — LACTATED RINGERS IV SOLN: INTRAVENOUS | Status: DC

## 2022-08-24 MED ORDER — CHLORHEXIDINE GLUCONATE CLOTH 2 % EX PADS: 6.0000 | MEDICATED_PAD | Freq: Once | CUTANEOUS | Status: DC

## 2022-08-24 MED ORDER — ACETAMINOPHEN 160 MG/5ML PO SOLN: 325.0000 mg | ORAL | Status: DC | PRN

## 2022-08-24 MED ORDER — OXYCODONE HCL 5 MG PO TABS: 5.0000 mg | ORAL_TABLET | Freq: Once | ORAL | Status: DC | PRN

## 2022-08-24 MED ORDER — MIDAZOLAM HCL 2 MG/2ML IJ SOLN
INTRAMUSCULAR | Status: AC
  Filled 2022-08-24: qty 2

## 2022-08-24 MED ORDER — PROPOFOL 10 MG/ML IV BOLUS
INTRAVENOUS | Status: DC | PRN
  Administered 2022-08-24: 20 mg via INTRAVENOUS

## 2022-08-24 MED ORDER — FENTANYL CITRATE (PF) 100 MCG/2ML IJ SOLN
INTRAMUSCULAR | Status: AC
  Filled 2022-08-24: qty 2

## 2022-08-24 MED ORDER — ACETAMINOPHEN 500 MG PO TABS
1000.0000 mg | ORAL_TABLET | ORAL | Status: AC
  Administered 2022-08-24: 1000 mg via ORAL

## 2022-08-24 MED ORDER — OXYCODONE HCL 5 MG/5ML PO SOLN: 5.0000 mg | Freq: Once | ORAL | Status: DC | PRN

## 2022-08-24 MED ORDER — PROPOFOL 10 MG/ML IV BOLUS
INTRAVENOUS | Status: AC
  Filled 2022-08-24: qty 20

## 2022-08-24 SURGICAL SUPPLY — 30 items
ADH SKN CLS APL DERMABOND .7 (GAUZE/BANDAGES/DRESSINGS) ×1
APL PRP STRL LF DISP 70% ISPRP (MISCELLANEOUS) ×1
BLADE SURG 15 STRL LF DISP TIS (BLADE) ×2 IMPLANT
BLADE SURG 15 STRL SS (BLADE) ×1
CHLORAPREP W/TINT 26 (MISCELLANEOUS) ×2 IMPLANT
COVER BACK TABLE 60X90IN (DRAPES) ×2 IMPLANT
COVER MAYO STAND STRL (DRAPES) ×2 IMPLANT
DERMABOND ADVANCED .7 DNX12 (GAUZE/BANDAGES/DRESSINGS) ×2 IMPLANT
DRAPE LAPAROTOMY 100X72 PEDS (DRAPES) ×2 IMPLANT
DRAPE UTILITY XL STRL (DRAPES) ×2 IMPLANT
ELECT COATED BLADE 2.86 ST (ELECTRODE) ×2 IMPLANT
ELECT REM PT RETURN 9FT ADLT (ELECTROSURGICAL) ×1
ELECTRODE REM PT RTRN 9FT ADLT (ELECTROSURGICAL) ×2 IMPLANT
GLOVE BIO SURGEON STRL SZ7 (GLOVE) ×2 IMPLANT
GLOVE BIOGEL PI IND STRL 7.5 (GLOVE) ×2 IMPLANT
GOWN STRL REUS W/ TWL LRG LVL3 (GOWN DISPOSABLE) ×4 IMPLANT
GOWN STRL REUS W/TWL LRG LVL3 (GOWN DISPOSABLE) ×2
NDL HYPO 25X1 1.5 SAFETY (NEEDLE) ×2 IMPLANT
NEEDLE HYPO 25X1 1.5 SAFETY (NEEDLE) ×1 IMPLANT
PACK BASIN DAY SURGERY FS (CUSTOM PROCEDURE TRAY) ×2 IMPLANT
PENCIL SMOKE EVACUATOR (MISCELLANEOUS) ×2 IMPLANT
SLEEVE SCD COMPRESS KNEE MED (STOCKING) IMPLANT
SPIKE FLUID TRANSFER (MISCELLANEOUS) ×2 IMPLANT
SPONGE T-LAP 4X18 ~~LOC~~+RFID (SPONGE) ×2 IMPLANT
STRIP CLOSURE SKIN 1/2X4 (GAUZE/BANDAGES/DRESSINGS) ×2 IMPLANT
SUT MNCRL AB 4-0 PS2 18 (SUTURE) ×2 IMPLANT
SUT VIC AB 3-0 SH 27 (SUTURE) ×1
SUT VIC AB 3-0 SH 27X BRD (SUTURE) ×2 IMPLANT
SYR CONTROL 10ML LL (SYRINGE) ×2 IMPLANT
TOWEL GREEN STERILE FF (TOWEL DISPOSABLE) ×2 IMPLANT

## 2022-08-24 NOTE — Op Note (Signed)
Preoperative diagnosis: Breast cancer no longer needs venous access Postoperative diagnosis: Same as above Procedure: Port removal Surgeon: Dr. Serita Grammes Anesthesia: IV sedation and local Specimens: None Drains: None Estimated blood loss: Minimal Complications: None Drains: None Sponge needle count was correct completion Disposition to recovery stable addition  Indications: This a 52 year old female who presents for port removal.  She is completed therapy for breast cancer and does not need venous access.  She chose to have this under monitored anesthesia care at the surgery center.  Procedure: After informed consent was obtained the patient was taken to the operative room.  She was placed under monitored anesthesia care.  She was prepped and draped in the standard sterile surgical fashion.  Surgical timeout was then performed.  Infiltrated Marcaine at the site of her prior incision.  I then reentered this.  I remove the port, line, and the suture material in their entirety.  I then obtained hemostasis.  I closed this with 3-0 Vicryl and 4-0 Monocryl.  Glue and a Steri-Strip were applied.  She tolerated this well and was transferred to recovery stable.

## 2022-08-24 NOTE — Anesthesia Postprocedure Evaluation (Signed)
Anesthesia Post Note  Patient: Joanna Osborn  Procedure(s) Performed: REMOVAL PORT-A-CATH (Right: Chest)     Patient location during evaluation: PACU Anesthesia Type: MAC Level of consciousness: awake and alert Pain management: pain level controlled Vital Signs Assessment: post-procedure vital signs reviewed and stable Respiratory status: spontaneous breathing, nonlabored ventilation, respiratory function stable and patient connected to nasal cannula oxygen Cardiovascular status: stable and blood pressure returned to baseline Postop Assessment: no apparent nausea or vomiting Anesthetic complications: no  No notable events documented.  Last Vitals:  Vitals:   08/24/22 1133 08/24/22 1149  BP: 128/86 (!) 121/95  Pulse: 87 86  Resp: 19   Temp:  36.6 C  SpO2: 96% 98%    Last Pain:  Vitals:   08/24/22 1149  TempSrc: Oral  PainSc:                  Aveena Bari

## 2022-08-24 NOTE — H&P (Signed)
Joanna Osborn is an 52 y.o. female.   Chief Complaint: breast cancer HPI: 40 yof who has completed therapy for breast cancer now desires port removal.   Past Medical History:  Diagnosis Date   Anemia    Anxiety    Chemotherapy-induced peripheral neuropathy ([place])    per pt toes   Depression    Family history of colon cancer 03/19/2021   History of chemotherapy    left breast cancer  04-04-2021  to 07-26-2021 and restarted 08-14-2021   History of external beam radiation therapy    left breast cancer  10-02-2021  to 11-18-2021   History of kidney stones    Hypertension    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   Malignant neoplasm of upper-outer quadrant of left breast in female, estrogen receptor positive ([place]) 03/13/2021   oncologist--- dr Burr Medico;  Stage IB, IDC, ER/ PR/ HER2 +;   chemo 04-04-2021  to 07-26-2021 and started 08-14-2021;  raditation completed 11-18-2021   Port-A-Cath in place 04/02/2021   Wears glasses     Past Surgical History:  Procedure Laterality Date   BIOPSY  02/19/2022   Procedure: BIOPSY;  Surgeon: Rush Landmark Telford Nab., MD;  Location: [place];  Service: Gastroenterology;;   BREAST LUMPECTOMY WITH RADIOACTIVE SEED AND SENTINEL LYMPH NODE BIOPSY Left 08/25/2021   Procedure: LEFT BREAST LUMPECTOMY WITH RADIOACTIVE SEED AND AXILLARY SENTINEL LYMPH NODE BIOPSY;  Surgeon: Rolm Bookbinder, MD;  Location: [place];  Service: General;  Laterality: Left;   CARPAL TUNNEL RELEASE Right 08/17/2008   '@[facility]'$  by dr Fredna Dow;   and excision wrist ganglian cyst   CHOLECYSTECTOMY, LAPAROSCOPIC  06/27/2002   '@[facility]'$  by dr Bubba Camp   ESOPHAGOGASTRODUODENOSCOPY (EGD) WITH PROPOFOL N/A 02/19/2022   Procedure: ESOPHAGOGASTRODUODENOSCOPY (EGD) WITH PROPOFOL;  Surgeon: Irving Copas., MD;  Location: [place];  Service: Gastroenterology;  Laterality: N/A;   ESOPHAGOGASTRODUODENOSCOPY (EGD) WITH PROPOFOL N/A 07/13/2022   Procedure: ESOPHAGOGASTRODUODENOSCOPY (EGD) WITH  PROPOFOL;  Surgeon: Rush Landmark Telford Nab., MD;  Location: [facility] ENDOSCOPY;  Service: Gastroenterology;  Laterality: N/A;   EUS N/A 02/19/2022   Procedure: UPPER ENDOSCOPIC ULTRASOUND (EUS) RADIAL;  Surgeon: Irving Copas., MD;  Location: [place];  Service: Gastroenterology;  Laterality: N/A;   FINE NEEDLE ASPIRATION  02/19/2022   Procedure: FINE NEEDLE ASPIRATION (FNA) LINEAR;  Surgeon: Irving Copas., MD;  Location: [place];  Service: Gastroenterology;;   HYSTEROSCOPY WITH D & C  11/21/2007   '@[facility]'$  by dr Julien Girt;  polypectomy   INSERTION OF MESH N/A 10/28/2016   Procedure: INSERTION OF MESH;  Surgeon: Clovis [name], MD;  Location: [place];  Service: General;  Laterality: N/A;   LAPAROSCOPIC SALPINGO OOPHERECTOMY Bilateral 02/02/2022   Procedure: LAPAROSCOPIC BILATERAL SALPINGO OOPHORECTOMY;  Surgeon: Molli Posey, MD;  Location: [facility];  Service: Gynecology;  Laterality: Bilateral;   OOPHORECTOMY     PORTACATH PLACEMENT Right 04/02/2021   Procedure: INSERTION PORT-A-CATH;  Surgeon: Rolm Bookbinder, MD;  Location: [place];  Service: General;  Laterality: Right;   [place] N/A 10/28/2016   Procedure: LAPAROSCOPIC VENTRAL HERNIA REPAIR;  Surgeon: Clovis [name], MD;  Location: [place];  Service: General;  Laterality: N/A;    Family History  Problem Relation Age of Onset   Kidney disease Mother    Colon cancer Paternal Aunt    Colon cancer Paternal Grandmother        dx unknown age   Colon cancer Paternal Grandfather  dx after 29   Breast cancer Cousin    Hypertension Other    Cancer Other    Social History:  reports that she has never smoked. She has never used smokeless tobacco. She reports that she does not currently use alcohol. She reports that she does not use drugs.  Allergies:  Allergies  Allergen Reactions   Endocet [Oxycodone-Acetaminophen] Itching   Propoxyphene  Itching and Other (See Comments)    Darvocet*   Tape Rash    Medications Prior to Admission  Medication Sig Dispense Refill   Cholecalciferol (VITAMIN D3 PO) Take 1 tablet by mouth daily.     hydrochlorothiazide (HYDRODIURIL) 25 MG tablet Take 1 tablet (25 mg total) by mouth daily. (Patient taking differently: Take 25 mg by mouth daily.) 30 tablet 0   letrozole (FEMARA) 2.5 MG tablet Take 1 tablet (2.5 mg total) by mouth daily. 90 tablet 1   omeprazole (PRILOSEC) 40 MG capsule TAKE 1 CAPSULE (40 MG TOTAL) BY MOUTH 2 (TWO) TIMES DAILY BEFORE A MEAL. 180 capsule 0    No results found for this or any previous visit (from the past 48 hour(s)). No results found.  Review of Systems  All other systems reviewed and are negative.   Blood pressure 131/88, pulse 87, temperature 98.3 F (36.8 C), temperature source Oral, resp. rate 16, height '5\' 5"'$  (1.651 m), weight 102.3 kg, SpO2 97 %. Physical Exam Vitals reviewed.  Constitutional:      Appearance: Normal appearance.  Cardiovascular:     Rate and Rhythm: Normal rate.  Pulmonary:     Effort: Pulmonary effort is normal.     Comments: Right chest port in place Neurological:     Mental Status: She is alert.      Assessment/Plan Breast cancer Port removal   Rolm Bookbinder, MD 08/24/2022, 10:46 AM

## 2022-08-24 NOTE — Anesthesia Preprocedure Evaluation (Addendum)
Anesthesia Evaluation  Patient identified by MRN, date of birth, ID band Patient awake    Reviewed: Allergy & Precautions, NPO status , Patient's Chart, lab work & pertinent test results  Airway Mallampati: II  TM Distance: >3 FB Neck ROM: Full    Dental no notable dental hx. (+) Teeth Intact   Pulmonary neg pulmonary ROS   Pulmonary exam normal        Cardiovascular hypertension, Pt. on medications  Rhythm:Regular Rate:Normal  ECHO 2/23 NL EF NL valves   Neuro/Psych  PSYCHIATRIC DISORDERS Anxiety Depression    negative neurological ROS     GI/Hepatic Neg liver ROS, PUD,,,  Endo/Other  negative endocrine ROS    Renal/GU negative Renal ROS  negative genitourinary   Musculoskeletal negative musculoskeletal ROS (+)    Abdominal Normal abdominal exam  (+)   Peds  Hematology  (+) Blood dyscrasia, anemia   Anesthesia Other Findings   Reproductive/Obstetrics                             Anesthesia Physical Anesthesia Plan  ASA: 2  Anesthesia Plan: MAC   Post-op Pain Management:    Induction: Intravenous  PONV Risk Score and Plan: 2 and Propofol infusion and Treatment may vary due to age or medical condition  Airway Management Planned: Simple Face Mask, Natural Airway and Nasal Cannula  Additional Equipment: None  Intra-op Plan:   Post-operative Plan:   Informed Consent: I have reviewed the patients History and Physical, chart, labs and discussed the procedure including the risks, benefits and alternatives for the proposed anesthesia with the patient or authorized representative who has indicated his/her understanding and acceptance.     Dental advisory given  Plan Discussed with: Anesthesiologist and CRNA  Anesthesia Plan Comments:        Anesthesia Quick Evaluation

## 2022-08-24 NOTE — Interval H&P Note (Signed)
History and Physical Interval Note:  08/24/2022 10:47 AM  Joanna Osborn  has presented today for surgery, with the diagnosis of BREAST CANCER.  The various methods of treatment have been discussed with the patient and family. After consideration of risks, benefits and other options for treatment, the patient has consented to  Procedure(s): REMOVAL PORT-A-CATH (N/A) as a surgical intervention.  The patient's history has been reviewed, patient examined, no change in status, stable for surgery.  I have reviewed the patient's chart and labs.  Questions were answered to the patient's satisfaction.     Rolm Bookbinder

## 2022-08-24 NOTE — Anesthesia Procedure Notes (Signed)
Procedure Name: MAC Date/Time: 08/24/2022 10:59 AM  Performed by: Signe Colt, CRNAPre-anesthesia Checklist: Patient identified, Emergency Drugs available, Suction available, Patient being monitored and Timeout performed Patient Re-evaluated:Patient Re-evaluated prior to induction Oxygen Delivery Method: Simple face mask

## 2022-08-24 NOTE — Transfer of Care (Signed)
Immediate Anesthesia Transfer of Care Note  Patient: Joanna Osborn  Procedure(s) Performed: REMOVAL PORT-A-CATH (Right: Chest)  Patient Location: PACU  Anesthesia Type:MAC  Level of Consciousness: drowsy, patient cooperative, and responds to stimulation  Airway & Oxygen Therapy: Patient Spontanous Breathing and Patient connected to face mask oxygen  Post-op Assessment: Report given to RN and Post -op Vital signs reviewed and stable  Post vital signs: Reviewed and stable  Last Vitals:  Vitals Value Taken Time  BP    Temp    Pulse 88 08/24/22 1118  Resp    SpO2 99 % 08/24/22 1118  Vitals shown include unvalidated device data.  Last Pain:  Vitals:   08/24/22 0858  TempSrc: Oral  PainSc: 0-No pain      Patients Stated Pain Goal: 4 (22/29/79 8921)  Complications: No notable events documented.

## 2022-08-24 NOTE — Discharge Instructions (Addendum)
[place] Office Phone Number [phone]  POST OP INSTRUCTIONS Take 400 mg of ibuprofen every 8 hours or 650 mg tylenol every 6 hours for next 72 hours then as needed. Use ice several times daily also.  Take your usually prescribed medications unless otherwise directed You should eat very light the first 24 hours after surgery, such as soup, crackers, pudding, etc.  Resume your normal diet the day after surgery. Most patients will experience some swelling and bruising .  Ice packs bra will help. Swelling and bruising can take several days to resolve.  It is common to experience some constipation if taking pain medication after surgery.  Increasing fluid intake and taking a stool softener will usually help or prevent this problem from occurring.  A mild laxative (Milk of Magnesia or Miralax) should be taken according to package directions if there are no bowel movements after 48 hours. I used skin glue on the incision, you may shower in 24 hours.  The glue will flake off over the next 2-3 weeks.  Any sutures or staples will be removed at the office during your follow-up visit. ACTIVITIES:  You may resume regular daily activities (gradually increasing) beginning the next day.  Wearing a good support bra or sports bra minimizes pain and swelling.  You may have sexual intercourse when it is comfortable. You may drive when you no longer are taking prescription pain medication, you can comfortably wear a seatbelt, and you can safely maneuver your car and apply brakes. RETURN TO WORK:  ______________________________________________________________________________________ Dennis Bast should see your doctor in the office for a follow-up appointment approximately two weeks after your surgery.  Your doctor's nurse will typically make your follow-up appointment when she calls you with your pathology report.  Expect your pathology report 3-4 business days after your surgery.  You may call to check if  you do not hear from [place] after three days. OTHER INSTRUCTIONS: _______________________________________________________________________________________________ _____________________________________________________________________________________________________________________________________ _____________________________________________________________________________________________________________________________________ _____________________________________________________________________________________________________________________________________  WHEN TO CALL DR WAKEFIELD: Fever over 101.0 Nausea and/or vomiting. Extreme swelling or bruising. Continued bleeding from incision. Increased pain, redness, or drainage from the incision.  The clinic staff is available to answer your questions during regular business hours.  Please don't hesitate to call and ask to speak to one of the nurses for clinical concerns.  If you have a medical emergency, go to the nearest emergency room or call 911.  A surgeon from [facility] is always on call at the hospital.  For further questions, please visit [email or web address]   Post Anesthesia Home Care Instructions  Activity: Get plenty of rest for the remainder of the day. A responsible individual must stay with you for 24 hours following the procedure.  For the next 24 hours, DO NOT: -Drive a car -Paediatric nurse -Drink alcoholic beverages -Take any medication unless instructed by your physician -Make any legal decisions or sign important papers.  Meals: Start with liquid foods such as gelatin or soup. Progress to regular foods as tolerated. Avoid greasy, spicy, heavy foods. If nausea and/or vomiting occur, drink only clear liquids until the nausea and/or vomiting subsides. Call your physician if vomiting continues.  Special Instructions/Symptoms: Your throat may feel dry or sore from the anesthesia or the breathing tube  placed in your throat during surgery. If this causes discomfort, gargle with warm salt water. The discomfort should disappear within 24 hours.  If you had a scopolamine patch placed behind your ear for the management of post- operative nausea and/or vomiting:  1. The medication in the patch is effective for 72 hours, after which it should be removed.  Wrap patch in a tissue and discard in the trash. Wash hands thoroughly with soap and water. 2. You may remove the patch earlier than 72 hours if you experience unpleasant side effects which may include dry mouth, dizziness or visual disturbances. 3. Avoid touching the patch. Wash your hands with soap and water after contact with the patch.  No tylenol until after 3pm today if needed

## 2022-08-25 ENCOUNTER — Encounter (HOSPITAL_BASED_OUTPATIENT_CLINIC_OR_DEPARTMENT_OTHER): Payer: Self-pay | Admitting: General Surgery

## 2022-09-07 IMAGING — MG MM PLC BREAST LOC DEV 1ST LESION INC MAMMO GUIDE*L*
8 series · 8 of 8 positions shown · non-contrast
Comparison: Previous exam(s).

CLINICAL DATA: Localization prior to surgery

EXAM:
MAMMOGRAPHIC GUIDED RADIOACTIVE SEED LOCALIZATION OF THE LEFT BREAST

[L ML (1 of 5)]
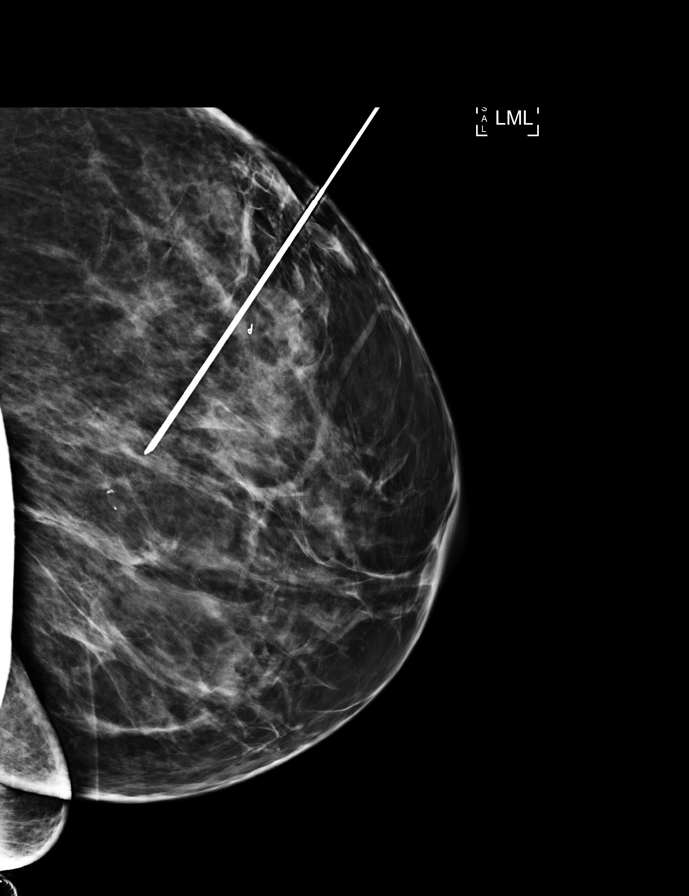

[L ML (2 of 5)]
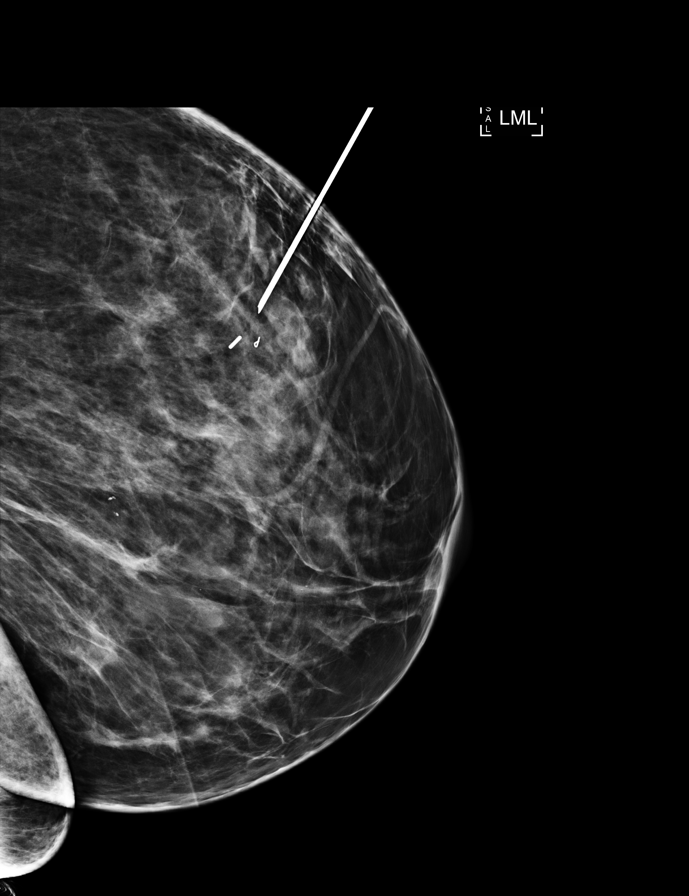

[L CC (1 of 3)]
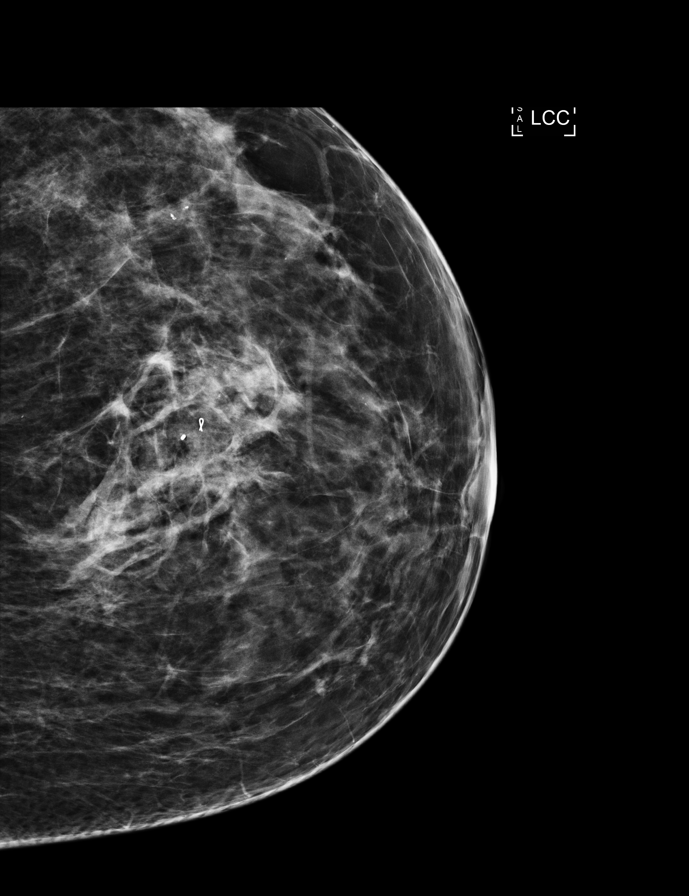

[L ML (3 of 5)]
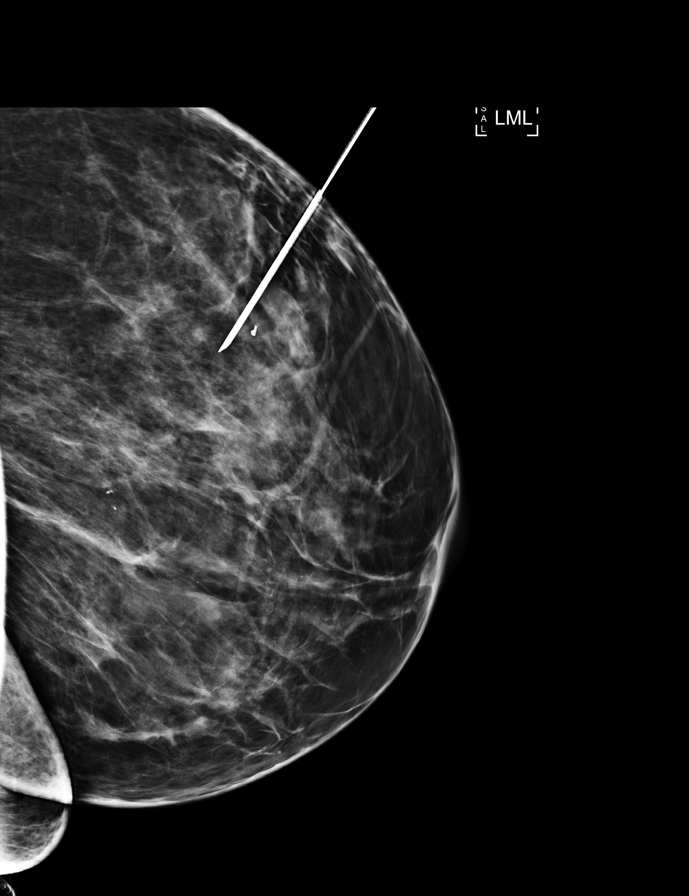

[L ML (4 of 5)]
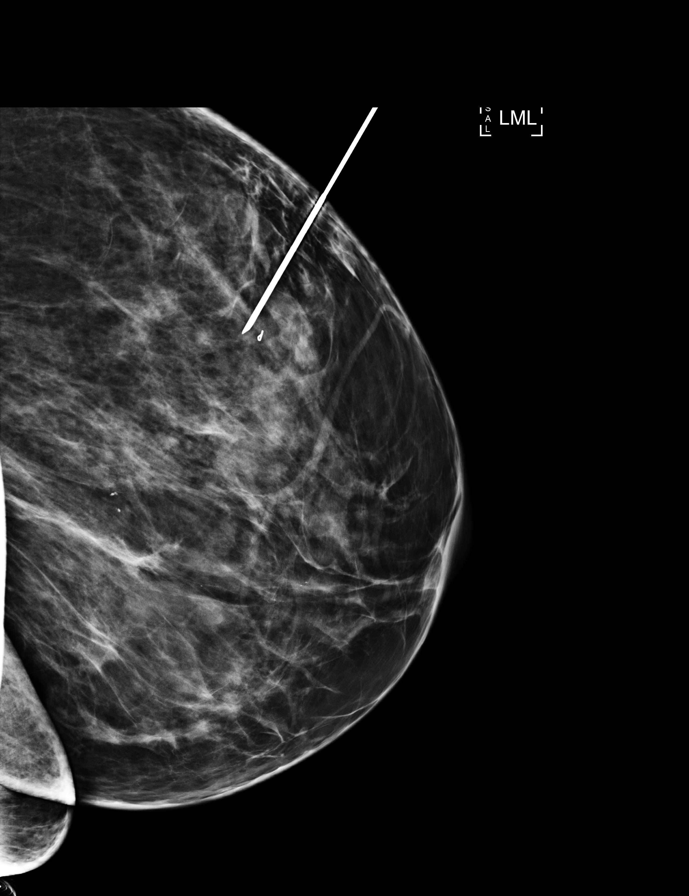

[L CC (2 of 3)]
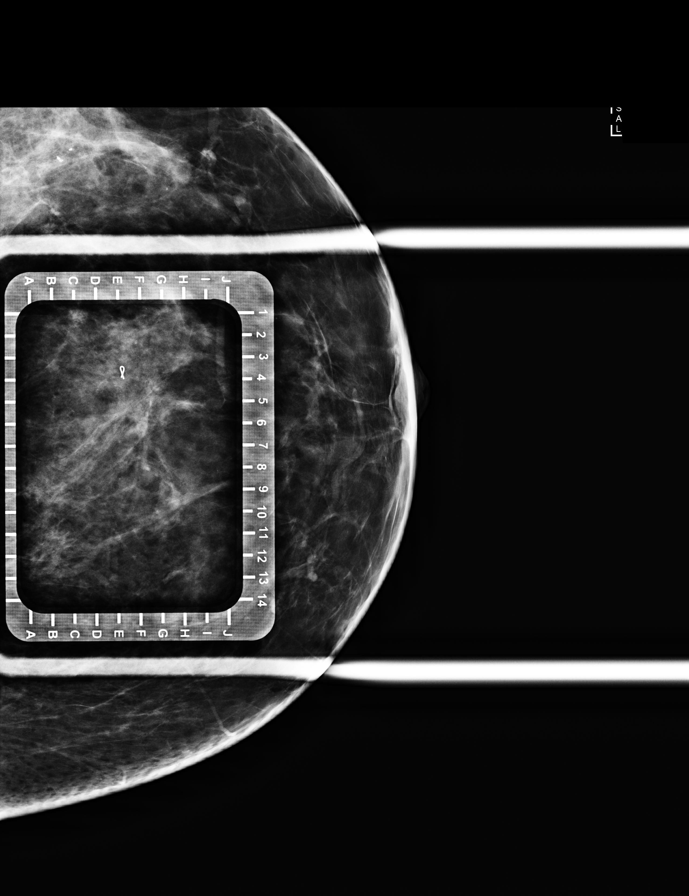

[L CC (3 of 3)]
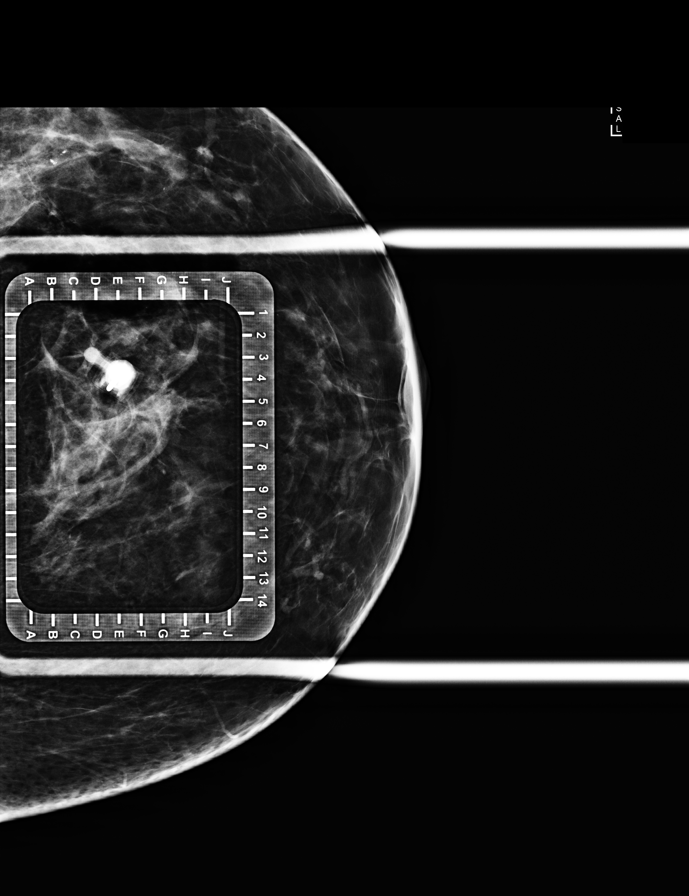

[L ML (5 of 5)]
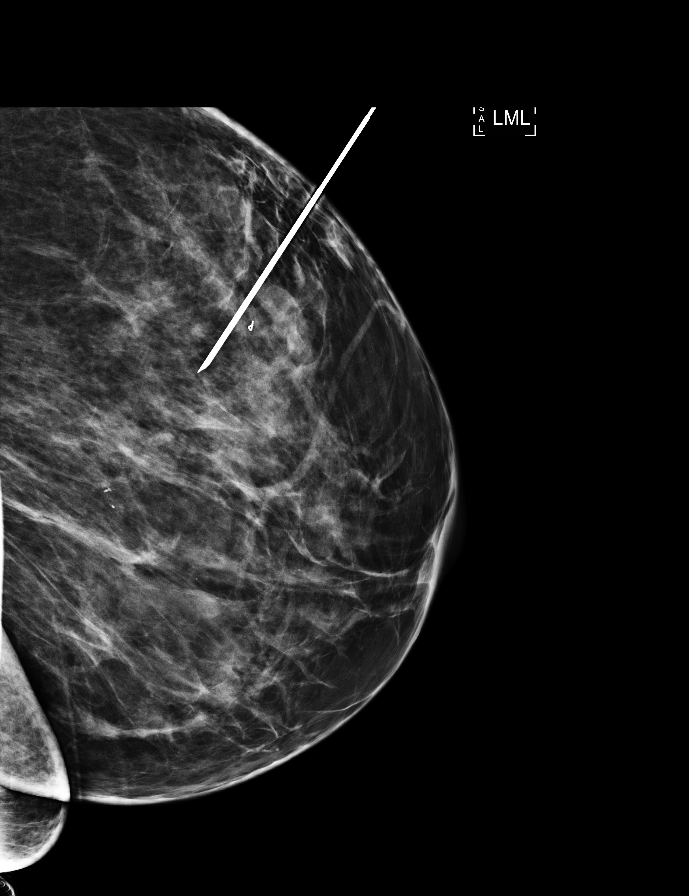

[8 of 8 positions shown; findings below may reference images not displayed]



The usual time-out protocol was performed immediately prior to the
procedure.

Using mammographic guidance, sterile technique, 1% lidocaine and an
[id number] radioactive seed, site of known malignancy was localized using
a superior approach. The follow-up mammogram images confirm the seed
in the expected location and were marked for the surgeon.

Follow-up survey of the patient confirms presence of the radioactive
seed.

Order number of [id number] seed:  [id number].

Total activity:  0.247 millicuries reference Date: August 11, 2021

The patient tolerated the procedure well and was released from the
[REDACTED]. She was given instructions regarding seed removal.
IMPRESSION: Radioactive seed localization left breast. No apparent
complications.

## 2022-09-10 IMAGING — MG MM BREAST SURGICAL SPECIMEN
1 series · 2 of 2 positions shown · non-contrast
Comparison: Previous exam(s).

CLINICAL DATA: Status post excisional biopsy of the left breast.

EXAM:
SPECIMEN RADIOGRAPH OF THE LEFT BREAST

[Series 1: L · left · 0.07mm/px · 2 of 2 slices shown]
[im 1/2]
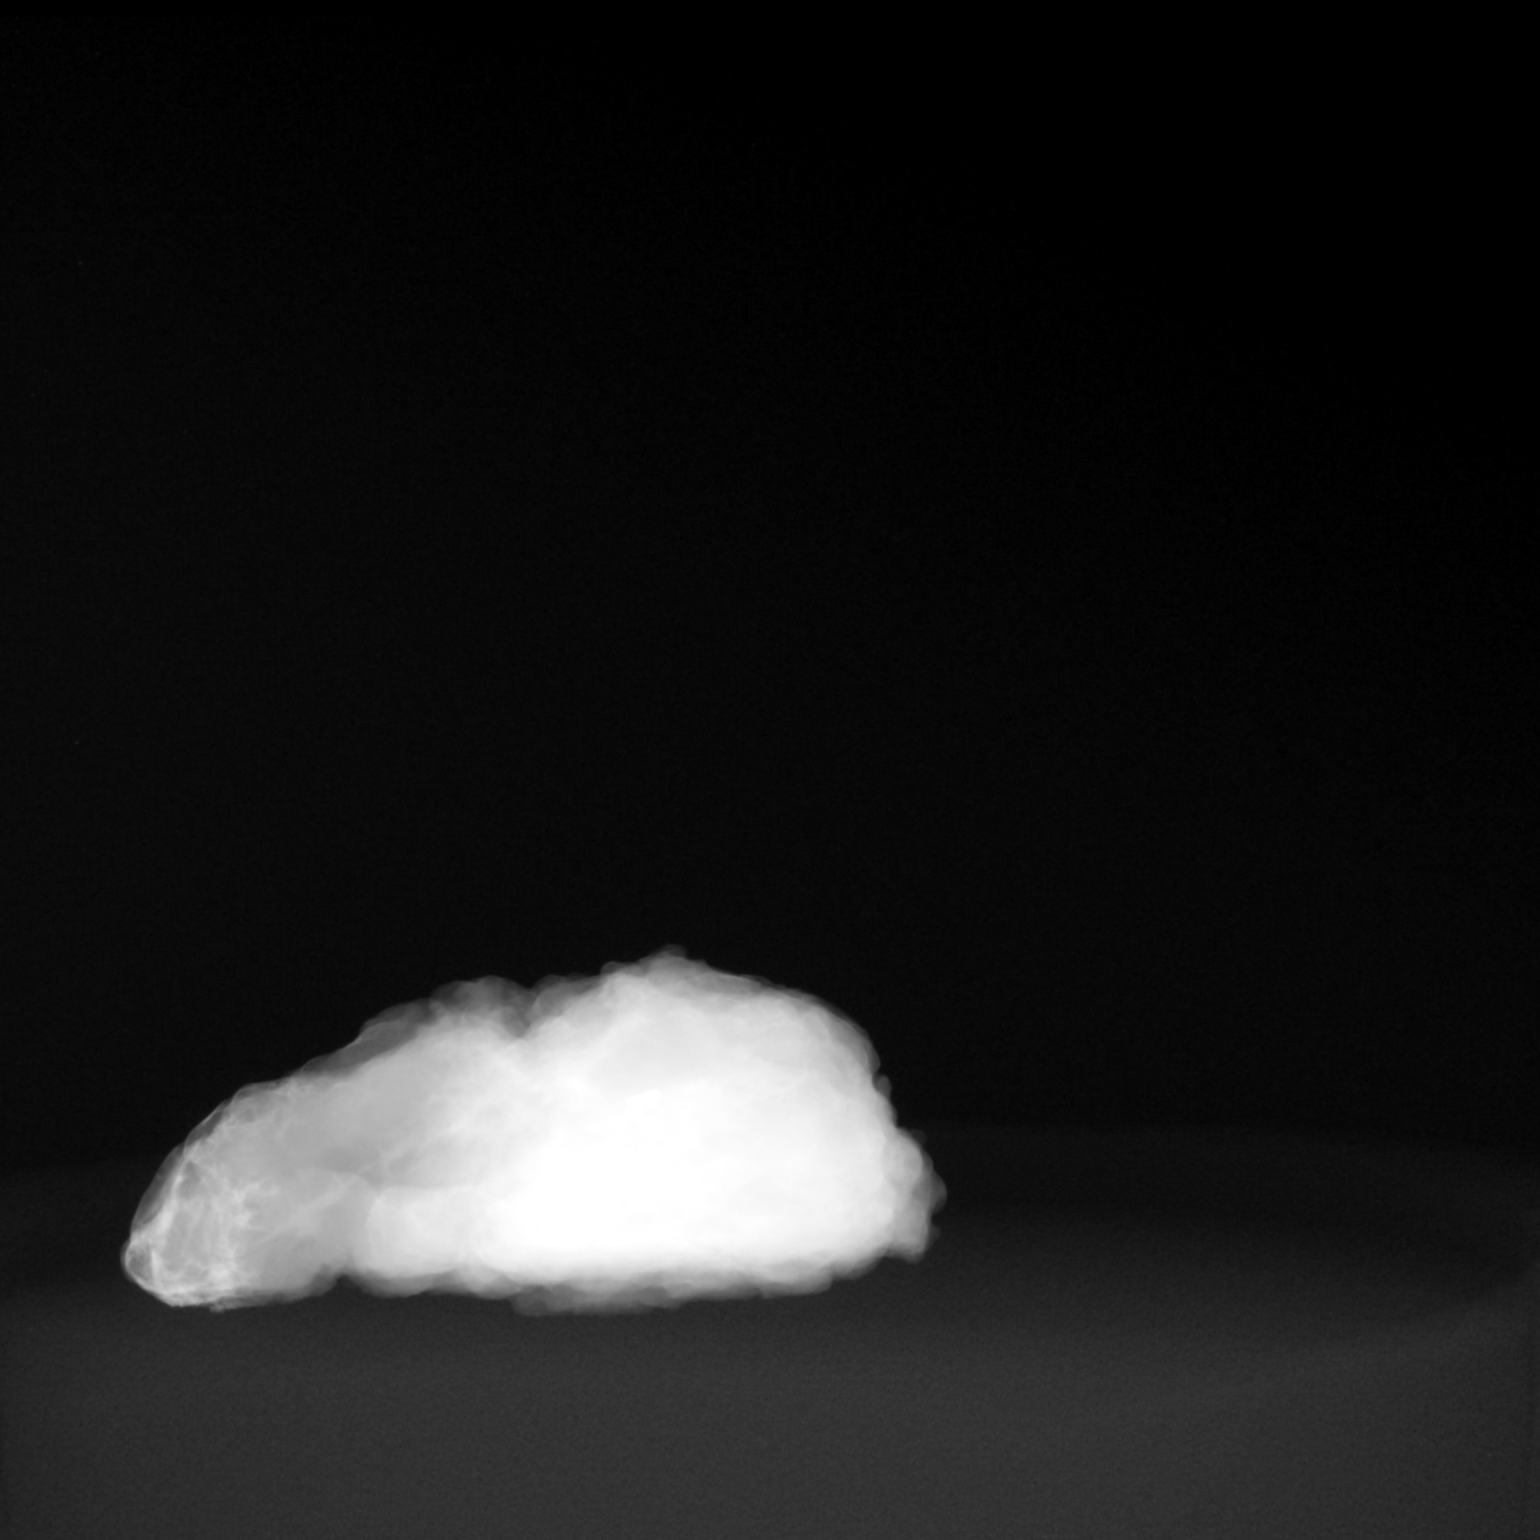
[im 2/2]
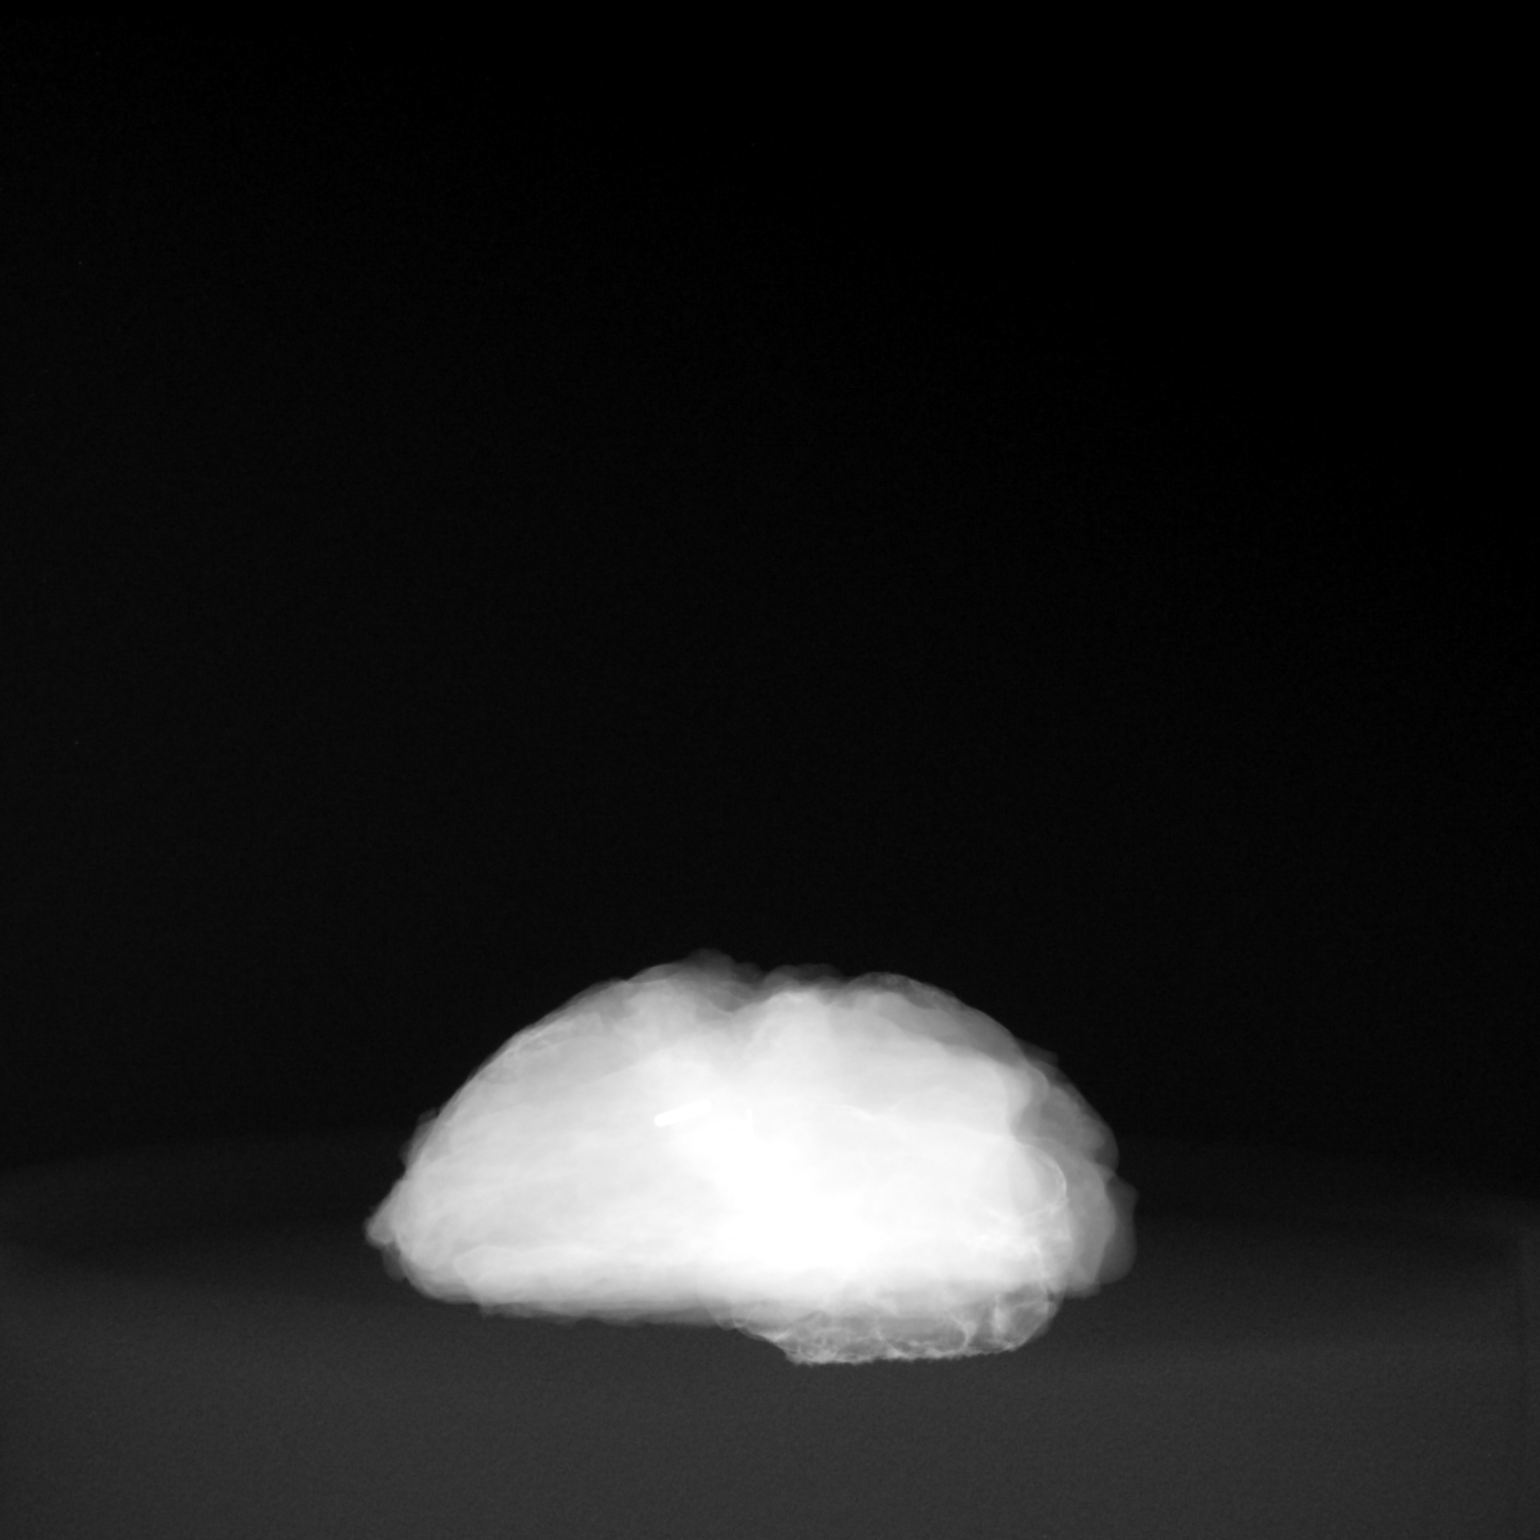

[2 of 2 positions shown; findings below may reference images not displayed]

FINDINGS: Status post excision of the left breast. The radioactive seed and
biopsy marker clip are present, completely intact, and were marked
for pathology.
IMPRESSION: Specimen radiograph of the left breast.

## 2022-09-23 ENCOUNTER — Other Ambulatory Visit: Payer: Self-pay

## 2022-09-23 DIAGNOSIS — C50412 Malignant neoplasm of upper-outer quadrant of left female breast: Secondary | ICD-10-CM

## 2022-09-24 ENCOUNTER — Inpatient Hospital Stay (HOSPITAL_BASED_OUTPATIENT_CLINIC_OR_DEPARTMENT_OTHER): Payer: Federal, State, Local not specified - PPO | Admitting: Nurse Practitioner

## 2022-09-24 ENCOUNTER — Inpatient Hospital Stay: Payer: Federal, State, Local not specified - PPO | Attending: Hematology

## 2022-09-24 ENCOUNTER — Encounter: Payer: Self-pay | Admitting: Nurse Practitioner

## 2022-09-24 VITALS — BP 136/91 | HR 86 | Temp 97.8°F | Resp 18 | Ht 65.0 in | Wt 231.5 lb

## 2022-09-24 DIAGNOSIS — Z170 Estrogen receptor positive status [ER+]: Secondary | ICD-10-CM | POA: Insufficient documentation

## 2022-09-24 DIAGNOSIS — C50412 Malignant neoplasm of upper-outer quadrant of left female breast: Secondary | ICD-10-CM | POA: Diagnosis not present

## 2022-09-24 DIAGNOSIS — I10 Essential (primary) hypertension: Secondary | ICD-10-CM | POA: Diagnosis not present

## 2022-09-24 DIAGNOSIS — Z79899 Other long term (current) drug therapy: Secondary | ICD-10-CM | POA: Diagnosis not present

## 2022-09-24 DIAGNOSIS — K869 Disease of pancreas, unspecified: Secondary | ICD-10-CM | POA: Diagnosis not present

## 2022-09-24 DIAGNOSIS — F419 Anxiety disorder, unspecified: Secondary | ICD-10-CM | POA: Insufficient documentation

## 2022-09-24 DIAGNOSIS — Z9221 Personal history of antineoplastic chemotherapy: Secondary | ICD-10-CM | POA: Insufficient documentation

## 2022-09-24 DIAGNOSIS — Z923 Personal history of irradiation: Secondary | ICD-10-CM | POA: Diagnosis not present

## 2022-09-24 DIAGNOSIS — I890 Lymphedema, not elsewhere classified: Secondary | ICD-10-CM | POA: Insufficient documentation

## 2022-09-24 DIAGNOSIS — K862 Cyst of pancreas: Secondary | ICD-10-CM | POA: Diagnosis not present

## 2022-09-24 DIAGNOSIS — F32A Depression, unspecified: Secondary | ICD-10-CM | POA: Insufficient documentation

## 2022-09-24 LAB — CBC WITH DIFFERENTIAL (CANCER CENTER ONLY)
Abs Immature Granulocytes: 0.02 10*3/uL (ref 0.00–0.07)
Basophils Absolute: 0 10*3/uL (ref 0.0–0.1)
Basophils Relative: 0 %
Eosinophils Absolute: 0.1 10*3/uL (ref 0.0–0.5)
Eosinophils Relative: 2 %
HCT: 36.9 % (ref 36.0–46.0)
Hemoglobin: 12.4 g/dL (ref 12.0–15.0)
Immature Granulocytes: 0 %
Lymphocytes Relative: 33 %
Lymphs Abs: 2 10*3/uL (ref 0.7–4.0)
MCH: 28.8 pg (ref 26.0–34.0)
MCHC: 33.6 g/dL (ref 30.0–36.0)
MCV: 85.8 fL (ref 80.0–100.0)
Monocytes Absolute: 0.3 10*3/uL (ref 0.1–1.0)
Monocytes Relative: 5 %
Neutro Abs: 3.6 10*3/uL (ref 1.7–7.7)
Neutrophils Relative %: 60 %
Platelet Count: 360 10*3/uL (ref 150–400)
RBC: 4.3 MIL/uL (ref 3.87–5.11)
RDW: 13.4 % (ref 11.5–15.5)
WBC Count: 6.1 10*3/uL (ref 4.0–10.5)
nRBC: 0 % (ref 0.0–0.2)

## 2022-09-24 LAB — CMP (CANCER CENTER ONLY)
ALT: 17 U/L (ref 0–44)
AST: 14 U/L — ABNORMAL LOW (ref 15–41)
Albumin: 4.3 g/dL (ref 3.5–5.0)
Alkaline Phosphatase: 98 U/L (ref 38–126)
Anion gap: 10 (ref 5–15)
BUN: 20 mg/dL (ref 6–20)
CO2: 23 mmol/L (ref 22–32)
Calcium: 9.6 mg/dL (ref 8.9–10.3)
Chloride: 107 mmol/L (ref 98–111)
Creatinine: 0.63 mg/dL (ref 0.44–1.00)
GFR, Estimated: >60 mL/min (ref >60)
Glucose, Bld: 118 mg/dL — ABNORMAL HIGH (ref 70–99)
Potassium: 4 mmol/L (ref 3.5–5.1)
Sodium: 140 mmol/L (ref 135–145)
Total Bilirubin: 0.5 mg/dL (ref 0.3–1.2)
Total Protein: 6.9 g/dL (ref 6.5–8.1)

## 2022-09-24 NOTE — Progress Notes (Signed)
Patient Care Team: Lennie Odor, [place] as PCP - General (Physician Assistant) Rockwell Germany, RN as Oncology Nurse Navigator Mauro Kaufmann, RN as Oncology Nurse Navigator Rolm Bookbinder, MD as Consulting Physician (General Surgery) Truitt Merle, MD as Consulting Physician (Hematology) Kyung Rudd, MD as Consulting Physician (Radiation Oncology) Truitt Merle, MD as Consulting Physician (Hematology) Imaging, [facility] as Consulting Physician (Diagnostic Radiology)   CHIEF COMPLAINT: Follow up left breast cancer  Oncology History Overview Note   Cancer Staging  Malignant neoplasm of upper-outer quadrant of left breast in female, estrogen receptor positive ([place]) Staging form: Breast, AJCC 8th Edition - Clinical stage from 03/07/2021: Stage IB (cT2, cN0, cM0, G3, ER+, PR+, HER2+) - Signed by Truitt Merle, MD on 03/17/2021 Stage prefix: Initial diagnosis Histologic grading system: 3 grade system - Pathologic stage from 08/25/2021: No Stage Recommended (ypT0, pN0, cM0, G3, ER+, PR+, HER2+) - Signed by Truitt Merle, MD on 09/05/2021 Stage prefix: Post-therapy Response to neoadjuvant therapy: Complete response Nuclear grade: G3 Histologic grading system: 3 grade system     Malignant neoplasm of upper-outer quadrant of left breast in female, estrogen receptor positive ([place])  03/05/2021 Mammogram   Diagnostic Left Mammogram; Left Breast Ultrasound  IMPRESSION: At the palpable site of concern in the left breast at 12 o'clock there is a suspicious mass measuring 2.1 cm.   03/07/2021 Cancer Staging   Staging form: Breast, AJCC 8th Edition - Clinical stage from 03/07/2021: Stage IB (cT2, cN0, cM0, G3, ER+, PR+, HER2+) - Signed by Truitt Merle, MD on 03/17/2021 Stage prefix: Initial diagnosis Histologic grading system: 3 grade system   03/07/2021 Pathology Results   Diagnosis Breast, left, needle core biopsy, left breast 12 o' clock - INVASIVE DUCTAL CARCINOMA - SEE  COMMENT Based on the biopsy, the carcinoma appears Nottingham grade 3 of 3  PROGNOSTIC INDICATORS Results: IMMUNOHISTOCHEMICAL AND MORPHOMETRIC ANALYSIS PERFORMED MANUALLY The tumor cells are POSITIVE for Her2 (3+). Estrogen Receptor: 30%, POSITIVE, WEAK STAINING INTENSITY Progesterone Receptor: 15%, POSITIVE, MODERATE STAINING INTENSITY Proliferation Marker Ki67: 40%     03/13/2021 Initial Diagnosis   Malignant neoplasm of upper-outer quadrant of left breast in female, estrogen receptor positive ([place])   03/26/2021 Imaging   MRI Breast  IMPRESSION: 1. 2.5 cm biopsy-proven malignancy within the UPPER LEFT breast. 2. Abnormal lymph node with focal cortical thickening in the posterior UPPER-OUTER LEFT breast/LOWER LEFT axilla. 2nd-look ultrasound with possible biopsy is recommended. 3. Indeterminate 0.8 cm OUTER RIGHT breast mass. 2nd-look ultrasound with possible biopsy is recommended.   03/29/2021 Genetic Testing   Negative hereditary cancer genetic testing: no pathogenic variants detected in [facility] CustomNext-Cancer +RNAinsight Panel.  The report date is March 29, 2021.   The CustomNext-Cancer+RNAinsight panel offered by [facility] includes sequencing and rearrangement analysis for the following 47 genes:  APC, ATM, AXIN2, BARD1, BMPR1A, BRCA1, BRCA2, BRIP1, CDH1, CDK4, CDKN2A, CHEK2, DICER1, EPCAM, GREM1, HOXB13, MEN1, MLH1, MSH2, MSH3, MSH6, MUTYH, NBN, NF1, NF2, NTHL1, PALB2, PMS2, POLD1, POLE, PTEN, RAD51C, RAD51D, RECQL, RET, SDHA, SDHAF2, SDHB, SDHC, SDHD, SMAD4, SMARCA4, STK11, TP53, TSC1, TSC2, and VHL.  RNA data is routinely analyzed for use in variant interpretation for all genes.   04/04/2021 - 07/26/2021 Chemotherapy   Patient is on Treatment Plan : BREAST  Docetaxel + Carboplatin + Trastuzumab + Pertuzumab  (TCHP) q21d      04/14/2021 Imaging   [place] Bilateral Breast  IMPRESSION: 1. No sonographic correlate for the 8 mm enhancing mass in the outer right  breast.  Recommendation is to prior seed with MRI guided biopsy. 2. An undulating, low lying lymph node along the 1 o'clock axis of the left breast demonstrates no areas of cortical thickening beyond 3 mm.   08/14/2021 - 03/12/2022 Chemotherapy   Patient is on Treatment Plan : BREAST Trastuzumab + Pertuzumab q21d     08/25/2021 Cancer Staging   Staging form: Breast, AJCC 8th Edition - Pathologic stage from 08/25/2021: No Stage Recommended (ypT0, pN0, cM0, G3, ER+, PR+, HER2+) - Signed by Truitt Merle, MD on 09/05/2021 Stage prefix: Post-therapy Response to neoadjuvant therapy: Complete response Nuclear grade: G3 Histologic grading system: 3 grade system   08/25/2021 Definitive Surgery   FINAL MICROSCOPIC DIAGNOSIS:   A. BREAST, LEFT, LUMPECTOMY:  - No evidence of residual carcinoma - complete therapeutic response  - Therapy related changes, including hyalin fibrosis  - Fibrocystic change with urinary  - See comment   B. BREAST, LEFT ADDITIONAL MEDIAL MARGIN, EXCISION:  - Fibrocystic change  - Negative for carcinoma   C. BREAST, LEFT ADDITIONAL SUPERIOR MARGIN, EXCISION:  - Fibrocystic change  - Negative for carcinoma   D. BREAST, LEFT ADDITIONAL POSTERIOR MARGIN, EXCISION:  - Benign breast parenchyma  - Negative for carcinoma   E. BREAST, LEFT ADDITIONAL ANTERIOR MARGIN, EXCISION:  - Benign breast parenchyma  - Negative for carcinoma   F. LYMPH NODE, LEFT AXILLARY, SENTINEL, EXCISION:  - Lymph node, negative for carcinoma (0/1)   G. LYMPH NODE, LEFT AXILLARY, SENTINEL, EXCISION:  - Lymph node, negative for carcinoma (0/1)    COMMENT:  The appropriate pathologic stage (AJCC eighth edition) is ypT0 ypN0.       CURRENT THERAPY: Letrozole, starting 11/2021  INTERVAL HISTORY Joanna Osborn returns for follow up as scheduled. Last seen by Dr. 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 Mammogram in December was benign. She has intermittent  breast pain which is stable since surgery. Denies new lump/mass, nipple discharge/inversion or skin change. She thinks she needs to get back on the LE machine and wear a sleeve. Continues letrozole, tolerating with moderate hot flashes and joint stiffness. Also noticed thin hair and brittle nails. She can deal with these. A few weeks ago was having intermittent abdominal discomfort but that has resolved.   ROS  All other systems reviewed and negative   Past Medical History:  Diagnosis Date   Anemia    Anxiety    Chemotherapy-induced peripheral neuropathy (HCC)    per pt toes   Depression    Family history of colon cancer 03/19/2021   History of chemotherapy    left breast cancer  04-04-2021  to 07-26-2021 and restarted 08-14-2021   History of external beam radiation therapy    left breast cancer  10-02-2021  to 11-18-2021   History of kidney stones    Hypertension    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   Malignant neoplasm of upper-outer quadrant of left breast in female, estrogen receptor positive ([place]) 03/13/2021   oncologist--- dr Burr Medico;  Stage IB, IDC, ER/ PR/ HER2 +;   chemo 04-04-2021  to 07-26-2021 and started 08-14-2021;  raditation completed 11-18-2021   Port-A-Cath in place 04/02/2021   Wears glasses      Past Surgical History:  Procedure Laterality Date   BIOPSY  02/19/2022   Procedure: BIOPSY;  Surgeon: Irving Copas., MD;  Location: [facility] ENDOSCOPY;  Service: Gastroenterology;;   BREAST LUMPECTOMY WITH RADIOACTIVE SEED AND SENTINEL LYMPH NODE BIOPSY Left 08/25/2021  Procedure: LEFT BREAST LUMPECTOMY WITH RADIOACTIVE SEED AND AXILLARY SENTINEL LYMPH NODE BIOPSY;  Surgeon: Rolm Bookbinder, MD;  Location: [place];  Service: General;  Laterality: Left;   CARPAL TUNNEL RELEASE Right 08/17/2008   '@[facility]'$  by dr Fredna Dow;   and excision wrist ganglian cyst   CHOLECYSTECTOMY, LAPAROSCOPIC  06/27/2002   '@[facility]'$  by dr Bubba Camp   ESOPHAGOGASTRODUODENOSCOPY (EGD) WITH  PROPOFOL N/A 02/19/2022   Procedure: ESOPHAGOGASTRODUODENOSCOPY (EGD) WITH PROPOFOL;  Surgeon: Irving Copas., MD;  Location: [place];  Service: Gastroenterology;  Laterality: N/A;   ESOPHAGOGASTRODUODENOSCOPY (EGD) WITH PROPOFOL N/A 07/13/2022   Procedure: ESOPHAGOGASTRODUODENOSCOPY (EGD) WITH PROPOFOL;  Surgeon: Rush Landmark Telford Nab., MD;  Location: [facility] ENDOSCOPY;  Service: Gastroenterology;  Laterality: N/A;   EUS N/A 02/19/2022   Procedure: UPPER ENDOSCOPIC ULTRASOUND (EUS) RADIAL;  Surgeon: Irving Copas., MD;  Location: [place];  Service: Gastroenterology;  Laterality: N/A;   FINE NEEDLE ASPIRATION  02/19/2022   Procedure: FINE NEEDLE ASPIRATION (FNA) LINEAR;  Surgeon: Irving Copas., MD;  Location: [place];  Service: Gastroenterology;;   HYSTEROSCOPY WITH D & C  11/21/2007   '@[facility]'$  by dr Julien Girt;  polypectomy   INSERTION OF MESH N/A 10/28/2016   Procedure: INSERTION OF MESH;  Surgeon: Clovis [name], MD;  Location: [place];  Service: General;  Laterality: N/A;   LAPAROSCOPIC SALPINGO OOPHERECTOMY Bilateral 02/02/2022   Procedure: LAPAROSCOPIC BILATERAL SALPINGO OOPHORECTOMY;  Surgeon: Molli Posey, MD;  Location: [facility];  Service: Gynecology;  Laterality: Bilateral;   OOPHORECTOMY     PORT-A-CATH REMOVAL Right 08/24/2022   Procedure: REMOVAL PORT-A-CATH;  Surgeon: Rolm Bookbinder, MD;  Location: [place];  Service: General;  Laterality: Right;   PORTACATH PLACEMENT Right 04/02/2021   Procedure: INSERTION PORT-A-CATH;  Surgeon: Rolm Bookbinder, MD;  Location: [place];  Service: General;  Laterality: Right;   [place] N/A 10/28/2016   Procedure: LAPAROSCOPIC VENTRAL HERNIA REPAIR;  Surgeon: Clovis [name], MD;  Location: [place];  Service: General;  Laterality: N/A;     Outpatient Encounter Medications as of 09/24/2022  Medication Sig    Cholecalciferol (VITAMIN D3 PO) Take 1 tablet by mouth daily.   hydrochlorothiazide (HYDRODIURIL) 25 MG tablet Take 1 tablet (25 mg total) by mouth daily. (Patient taking differently: Take 25 mg by mouth daily.)   letrozole (FEMARA) 2.5 MG tablet Take 1 tablet (2.5 mg total) by mouth daily.   omeprazole (PRILOSEC) 40 MG capsule TAKE 1 CAPSULE (40 MG TOTAL) BY MOUTH 2 (TWO) TIMES DAILY BEFORE A MEAL.   [DISCONTINUED] prochlorperazine (COMPAZINE) 10 MG tablet Take 1 tablet (10 mg total) by mouth every 6 (six) hours as needed (Nausea or vomiting).   No facility-administered encounter medications on file as of 09/24/2022.     Today's Vitals   09/24/22 0936 09/24/22 0942  BP: (!) 136/91   Pulse: 86   Resp: 18   Temp: 97.8 F (36.6 C)   SpO2: 100%   Weight: 231 lb 8 oz (105 kg)   Height: '5\' 5"'$  (1.651 m)   PainSc:  0-No pain   Body mass index is 38.52 kg/m.   PHYSICAL EXAM GENERAL:alert, no distress and comfortable SKIN: no rash  EYES: sclera clear NECK: without mass LYMPH:  no palpable cervical or supraclavicular lymphadenopathy  LUNGS: clear with normal breathing effort HEART: regular rate & rhythm, no lower extremity edema ABDOMEN: abdomen soft, non-tender and normal bowel sounds NEURO: alert &  oriented x 3 with fluent speech, no focal motor/sensory deficits Breast exam: Symmetrical without nipple discharge or inversion.  S/p left lumpectomy and radiation, incisions completely healed with some scar tissue.  Diffuse but mild hyperpigmentation.  Mild lymphedema in the neutral/lower inner quadrant.  No other palpable mass or nodularity in either breast or axilla that I could appreciate PAC removed  CBC    Component Value Date/Time   WBC 6.1 09/24/2022 0906   WBC 5.3 02/24/2022 1117   RBC 4.30 09/24/2022 0906   HGB 12.4 09/24/2022 0906   HCT 36.9 09/24/2022 0906   PLT 360 09/24/2022 0906   MCV 85.8 09/24/2022 0906   MCH 28.8 09/24/2022 0906   MCHC 33.6 09/24/2022 0906   RDW  13.4 09/24/2022 0906   LYMPHSABS 2.0 09/24/2022 0906   MONOABS 0.3 09/24/2022 0906   EOSABS 0.1 09/24/2022 0906   BASOSABS 0.0 09/24/2022 0906     CMP     Component Value Date/Time   NA 140 09/24/2022 0906   K 4.0 09/24/2022 0906   CL 107 09/24/2022 0906   CO2 23 09/24/2022 0906   GLUCOSE 118 (H) 09/24/2022 0906   BUN 20 09/24/2022 0906   CREATININE 0.63 09/24/2022 0906   CALCIUM 9.6 09/24/2022 0906   PROT 6.9 09/24/2022 0906   ALBUMIN 4.3 09/24/2022 0906   AST 14 (L) 09/24/2022 0906   ALT 17 09/24/2022 0906   ALKPHOS 98 09/24/2022 0906   BILITOT 0.5 09/24/2022 0906   GFRNONAA >60 09/24/2022 0906   GFRAA >60 04/29/2018 2023     ASSESSMENT & PLAN:Joanna Osborn is a 52 y.o. female with    1. Malignant neoplasm of upper-outer quadrant of left breast, Stage IB, 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  S/p neoadjuvant TCHP, left lumpectomy, adjuvant radiation, and a year of Herceptin/Perjeta -She began adjuvant Zoladex until BSO and letrozole in 11/2021  -Joanna Osborn is clinically doing well.  Tolerating letrozole with moderate hot flashes, joint stiffness, hair thinning and brittle nails. I recommend skin/hair/nail vitamin or biotin.  Side effects are manageable.   -We briefly discussed medical management for hot flashes including gabapentin versus SSRI, she will think about it -Breast exam shows mild left breast lymphedema, otherwise benign; she will restart home management. Labs are unremarkable. Recent mammogram 06/2022 is benign.  Overall no clinical concern for recurrence. -continue breast cancer surveillance and letrozole -F/up in 6 months    2.  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 -abdomen MRI on 11/25/21 showed a 2 cm complex cystic mass, suggesting cystic neoplasm.  -EGD/EUS on 02/19/22 with Dr. Rush Landmark, cytology from pancreatic head lesion was negative. -surveillance MRI 06/08/22 showed stable  lesion without discrete enhancing nodularity, and fatty liver -Plan to repeat MRI in 11/2022 (6 months from prior study) -Will do phone f/up after scan to review results    3. Anxiety and depression, HTN -f/u PCP -continue HCTZ -Reviewed SSRI as possible treatment for hot flashes, she will consider   4. Mild Anemia -Secondary to chemo -Resolved    PLAN: -Labs reviewed -Continue breast cancer surveillance and letrozole; pt will let me know if interested in medication for hot flashes. She will also start skin/hair/nail vitamin -Surveillance ABD MRI in 3 months, phone f/up after -In person f/up in 6 months, or sooner if needed   Orders Placed This Encounter  Procedures   MR Abdomen W Wo Contrast    Standing  Status:   Future    Standing Expiration Date:   09/24/2023    Order Specific Question:   If indicated for the ordered procedure, I authorize the administration of contrast media per Radiology protocol    Answer:   Yes    Order Specific Question:   What is the patient's sedation requirement?    Answer:   No Sedation    Order Specific Question:   Does the patient have a pacemaker or implanted devices?    Answer:   No    Order Specific Question:   Preferred imaging location?    Answer:   [facility] (table limit - 550 lbs)      All questions were answered. The patient knows to call the clinic with any problems, questions or concerns. No barriers to learning were detected. I spent 20 minutes counseling the patient face to face. The total time spent in the appointment was 30 minutes and more than 50% was on counseling, review of test results, and coordination of care.   Cira Rue, NP-C 09/24/2022

## 2022-09-28 DIAGNOSIS — I10 Essential (primary) hypertension: Secondary | ICD-10-CM | POA: Diagnosis not present

## 2022-09-28 DIAGNOSIS — E559 Vitamin D deficiency, unspecified: Secondary | ICD-10-CM | POA: Diagnosis not present

## 2022-09-28 DIAGNOSIS — F419 Anxiety disorder, unspecified: Secondary | ICD-10-CM | POA: Diagnosis not present

## 2022-09-28 DIAGNOSIS — C50919 Malignant neoplasm of unspecified site of unspecified female breast: Secondary | ICD-10-CM | POA: Diagnosis not present

## 2022-09-28 DIAGNOSIS — Z0000 Encounter for general adult medical examination without abnormal findings: Secondary | ICD-10-CM | POA: Diagnosis not present

## 2022-11-02 ENCOUNTER — Ambulatory Visit: Payer: Federal, State, Local not specified - PPO | Attending: General Surgery

## 2022-11-02 VITALS — Wt 232.1 lb

## 2022-11-02 DIAGNOSIS — Z483 Aftercare following surgery for neoplasm: Secondary | ICD-10-CM | POA: Insufficient documentation

## 2022-11-02 NOTE — Therapy (Signed)
OUTPATIENT PHYSICAL THERAPY [facility] SCREENING NOTE   Patient Name: Joanna Osborn MRN: [id number] DOB:26-Feb-1971, 51 y.o., female Today's Date: 11/02/2022  PCP: Milus Height, PA REFERRING PROVIDER: Emelia Loron, MD   PT End of Session - 11/02/22 [phone]     Visit Number 7   # unchanged due to screen only   PT Start Time 0847    PT Stop Time 0851    PT Time Calculation (min) 4 min    Activity Tolerance Patient tolerated treatment well    Behavior During Therapy [facility] for tasks assessed/performed             Past Medical History:  Diagnosis Date   Anemia    Anxiety    Chemotherapy-induced peripheral neuropathy (HCC)    per pt toes   Depression    Family history of colon cancer 03/19/2021   History of chemotherapy    left breast cancer  04-04-2021  to 07-26-2021 and restarted 08-14-2021   History of external beam radiation therapy    left breast cancer  10-02-2021  to 11-18-2021   History of kidney stones    Hypertension    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   Malignant neoplasm of upper-outer quadrant of left breast in female, 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   oncologist--- dr Mosetta Putt;  Stage IB, IDC, ER/ PR/ HER2 +;   chemo 04-04-2021  to 07-26-2021 and started 08-14-2021;  raditation completed 11-18-2021   Port-A-Cath in place 04/02/2021   Wears glasses    Past Surgical History:  Procedure Laterality Date   BIOPSY  02/19/2022   Procedure: BIOPSY;  Surgeon: Meridee Score Netty Starring., MD;  Location: [facility] ENDOSCOPY;  Service: Gastroenterology;;   BREAST LUMPECTOMY WITH RADIOACTIVE SEED AND SENTINEL LYMPH NODE BIOPSY Left 08/25/2021   Procedure: LEFT BREAST LUMPECTOMY WITH RADIOACTIVE SEED AND AXILLARY SENTINEL LYMPH NODE BIOPSY;  Surgeon: Emelia Loron, MD;  Location: [facility];  Service: General;  Laterality: Left;   CARPAL TUNNEL RELEASE Right 08/17/2008   @[facility]  by dr Merlyn Lot;   and excision wrist ganglian cyst   CHOLECYSTECTOMY, LAPAROSCOPIC   06/27/2002   @[facility]  by dr Lurene Shadow   ESOPHAGOGASTRODUODENOSCOPY (EGD) WITH PROPOFOL N/A 02/19/2022   Procedure: ESOPHAGOGASTRODUODENOSCOPY (EGD) WITH PROPOFOL;  Surgeon: Lemar Lofty., MD;  Location: [facility] ENDOSCOPY;  Service: Gastroenterology;  Laterality: N/A;   ESOPHAGOGASTRODUODENOSCOPY (EGD) WITH PROPOFOL N/A 07/13/2022   Procedure: ESOPHAGOGASTRODUODENOSCOPY (EGD) WITH PROPOFOL;  Surgeon: Meridee Score Netty Starring., MD;  Location: [facility] ENDOSCOPY;  Service: Gastroenterology;  Laterality: N/A;   EUS N/A 02/19/2022   Procedure: UPPER ENDOSCOPIC ULTRASOUND (EUS) RADIAL;  Surgeon: Lemar Lofty., MD;  Location: [facility] ENDOSCOPY;  Service: Gastroenterology;  Laterality: N/A;   FINE NEEDLE ASPIRATION  02/19/2022   Procedure: FINE NEEDLE ASPIRATION (FNA) LINEAR;  Surgeon: Lemar Lofty., MD;  Location: [facility] ENDOSCOPY;  Service: Gastroenterology;;   HYSTEROSCOPY WITH D & C  11/21/2007   @[facility]  by dr Renaldo Fiddler;  polypectomy   INSERTION OF MESH N/A 10/28/2016   Procedure: INSERTION OF MESH;  Surgeon: Berna Bue, MD;  Location[facility] OR;  Service: General;  Laterality: N/A;   LAPAROSCOPIC SALPINGO OOPHERECTOMY Bilateral 02/02/2022   Procedure: LAPAROSCOPIC BILATERAL SALPINGO OOPHORECTOMY;  Surgeon: Richarda Overlie, MD;  Location: [facility];  Service: Gynecology;  Laterality: Bilateral;   OOPHORECTOMY     PORT-A-CATH REMOVAL Right 08/24/2022   Procedure: REMOVAL PORT-A-CATH;  Surgeon: Emelia Loron, MD;  Location: [facility];  Service: General;  Laterality: Right;   PORTACATH  PLACEMENT Right 04/02/2021   Procedure: INSERTION PORT-A-CATH;  Surgeon: Emelia Loron, MD;  Location:  SURGERY CENTER;  Service: General;  Laterality: Right;   TUBAL LIGATION  1999   VENTRAL HERNIA REPAIR N/A 10/28/2016   Procedure: LAPAROSCOPIC VENTRAL HERNIA REPAIR;  Surgeon: Berna Bue, MD;  Location: [facility] OR;  Service: General;  Laterality: N/A;   Patient  Active Problem List   Diagnosis Date Noted   Left breast abscess 09/12/2021   Essential hypertension 09/12/2021   Lesion of pancreas 09/12/2021   Hypokalemia 09/12/2021   Genetic testing 04/08/2021   Port-A-Cath in place 04/03/2021   Family history of colon cancer 03/19/2021   Malignant neoplasm of upper-outer quadrant of left breast in female, estrogen receptor positive 03/13/2021   Suicide ideation 07/07/2013   Major depressive disorder, recurrent severe without psychotic features 07/06/2013    REFERRING DIAG: left breast cancer at risk for lymphedema  THERAPY DIAG: Aftercare following surgery for neoplasm  PERTINENT HISTORY: Patient was diagnosed on 03/05/2021 with left grade III invasive ductal carcinoma breast cancer. She underwent neoadjuvant chemotherapy from 04/03/2021-07/24/2021. She had a left lumpectomy and sentinel node biopsy (2 negative nodes) on 08/25/2021. It is triple positive with a Ki67 of 40%.   PRECAUTIONS: left UE Lymphedema risk, None  SUBJECTIVE: Pt returns for her 3 month L-Dex screen. "I just wear my sleeve when I can tell I ned to now."   PAIN:  Are you having pain? No  SOZO SCREENING: Patient was assessed today using the SOZO machine to determine the lymphedema index score. This was compared to her baseline score. It was determined that she is within the recommended range when compared to her baseline and no further action is needed at this time. She will continue SOZO screenings. These are done every 3 months for 2 years post operatively followed by every 6 months for 2 years, and then annually.  Did encourage pt to cont wear of compression sleeve during periods of highly repetitive activities, when increasing her HR (I.e. walking) and/or flying. Pt verbalized good understanding and was agreeable to this.     L-DEX FLOWSHEETS - 11/02/22 0800       L-DEX LYMPHEDEMA SCREENING   Measurement Type Unilateral    L-DEX MEASUREMENT EXTREMITY Upper Extremity     POSITION  Standing    DOMINANT SIDE Right    At Risk Side Left    BASELINE SCORE (UNILATERAL) -2.7    L-DEX SCORE (UNILATERAL) -1.5    VALUE CHANGE (UNILAT) 1.2              Hermenia Bers, PTA 11/02/2022, 8:50 AM

## 2022-12-02 ENCOUNTER — Other Ambulatory Visit: Payer: Self-pay | Admitting: Hematology

## 2022-12-14 ENCOUNTER — Encounter (HOSPITAL_COMMUNITY): Payer: Self-pay

## 2022-12-14 ENCOUNTER — Ambulatory Visit (HOSPITAL_COMMUNITY)
Admission: RE | Admit: 2022-12-14 | Discharge: 2022-12-14 | Disposition: A | Discharge status: Home / Self Care | Payer: Federal, State, Local not specified - PPO | Source: Ambulatory Visit | Attending: Nurse Practitioner | Admitting: Nurse Practitioner

## 2022-12-14 ENCOUNTER — Other Ambulatory Visit: Payer: Self-pay | Admitting: Nurse Practitioner

## 2022-12-14 ENCOUNTER — Encounter (HOSPITAL_COMMUNITY): Payer: Self-pay | Admitting: Nurse Practitioner

## 2022-12-14 DIAGNOSIS — K862 Cyst of pancreas: Secondary | ICD-10-CM

## 2022-12-14 DIAGNOSIS — K760 Fatty (change of) liver, not elsewhere classified: Secondary | ICD-10-CM | POA: Diagnosis not present

## 2022-12-14 DIAGNOSIS — K8689 Other specified diseases of pancreas: Secondary | ICD-10-CM | POA: Diagnosis not present

## 2022-12-14 DIAGNOSIS — R935 Abnormal findings on diagnostic imaging of other abdominal regions, including retroperitoneum: Secondary | ICD-10-CM | POA: Diagnosis not present

## 2022-12-14 DIAGNOSIS — R160 Hepatomegaly, not elsewhere classified: Secondary | ICD-10-CM | POA: Diagnosis not present

## 2022-12-14 MED ORDER — GADOBUTROL 1 MMOL/ML IV SOLN
10.0000 mL | Freq: Once | INTRAVENOUS | Status: AC | PRN
  Administered 2022-12-14: 10 mL via INTRAVENOUS

## 2022-12-23 ENCOUNTER — Inpatient Hospital Stay: Payer: Federal, State, Local not specified - PPO | Attending: Hematology

## 2022-12-24 NOTE — Progress Notes (Signed)
[facility]   [phone] Fax:([phone]   Clinic Follow up Note   Patient Care Team: Milus Height, [place] as PCP - General (Physician Assistant) Donnelly Angelica, RN as Oncology Nurse Navigator Pershing Proud, RN as Oncology Nurse Navigator Emelia Loron, MD as Consulting Physician (General Surgery) Malachy Mood, MD as Consulting Physician (Hematology) Dorothy Puffer, MD as Consulting Physician (Radiation Oncology) Malachy Mood, MD as Consulting Physician (Hematology) Imaging, [facility] as Consulting Physician (Diagnostic Radiology)  Date of Service:  12/25/2022  I connected with Joanna Osborn on 12/25/2022 at  8:00 AM EDT by video enabled telemedicine visit and verified that I am speaking with the correct person using two identifiers.  I discussed the limitations, risks, security and privacy concerns of performing an evaluation and management service by telephone and the availability of in person appointments. I also discussed with the patient that there may be a patient responsible charge related to this service. The patient expressed understanding and agreed to proceed.   Other persons participating in the visit and their role in the encounter:  no  Patient's location:   Car Provider's location:  [facility] Office  CHIEF COMPLAINT: f/u of left breast cancer   CURRENT THERAPY:  Letrozole, starting 11/2021  ASSESSMENT & PLAN:  Joanna Osborn is a 52 y.o. 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  S/p neoadjuvant TCHP, left lumpectomy, adjuvant radiation, and a year of Herceptin/Perjeta -She began adjuvant Zoladex until BSO and letrozole in 11/2021  -Joanna Osborn is clinically doing well.  Tolerating letrozole with moderate hot flashes, joint stiffness, hair thinning and brittle nails. I recommend  skin/hair/nail vitamin or biotin.  Side effects are manageable.    Lesion of pancreas -noted on CT AP on 09/12/21 for work up of diarrhea and abdominal pain. -abdomen MRI on 11/25/21 showed a 2 cm complex cystic mass, suggesting cystic neoplasm.  -EGD/EUS on 02/19/22 with Dr. Meridee Score, cytology from pancreatic head lesion was negative. 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 Given stable size, repeating EUS biopsy is probably not needed.  -will repeat MRI in 6-12 month, will copy Dr. Meridee Score   Obesity and liver steatosis -Her recent abdominal MRI showed hepatomegaly and severe hepatic steatosis -Per patient, her cholesterol has been normal -I encouraged her to lose weight.  She is willing to be referred to weight management clinic  PLAN: - Reviewed MRI on 5/20 -repeat MRI in 6-12 months -referral to weight management clinic -order Mammogram next visit -lab/flush and f/u in 3 months w/ NP Lacie -copy Dr. Meridee Score   SUMMARY OF ONCOLOGIC HISTORY: Oncology History Overview Note   Cancer Staging  Malignant neoplasm of upper-outer quadrant of left breast in female, estrogen receptor positive (HCC) Staging form: Breast, AJCC 8th Edition - Clinical stage from 03/07/2021: Stage IB (cT2, cN0, cM0, G3, ER+, PR+, HER2+) - Signed by Malachy Mood, MD on 03/17/2021 Stage prefix: Initial diagnosis Histologic grading system: 3 grade system - Pathologic stage from 08/25/2021: No Stage Recommended (ypT0, pN0, cM0, G3, ER+, PR+, HER2+) - Signed by Malachy Mood, MD on 09/05/2021 Stage prefix: Post-therapy Response  to neoadjuvant therapy: Complete response Nuclear grade: G3 Histologic grading system: 3 grade system      Malignant neoplasm of upper-outer quadrant of left breast in female, estrogen receptor positive (HCC)  03/05/2021 Mammogram   Diagnostic Left Mammogram; Left Breast Ultrasound  IMPRESSION: At the palpable site of concern in the left breast at 12 o'clock there is a suspicious mass measuring 2.1 cm.   03/07/2021 Cancer Staging   Staging form: Breast, AJCC 8th Edition - Clinical stage from 03/07/2021: Stage IB (cT2, cN0, cM0, G3, ER+, PR+, HER2+) - Signed by Malachy Mood, MD on 03/17/2021 Stage prefix: Initial diagnosis Histologic grading system: 3 grade system   03/07/2021 Pathology Results   Diagnosis Breast, left, needle core biopsy, left breast 12 o' clock - INVASIVE DUCTAL CARCINOMA - SEE COMMENT Based on the biopsy, the carcinoma appears Nottingham grade 3 of 3  PROGNOSTIC INDICATORS Results: IMMUNOHISTOCHEMICAL AND MORPHOMETRIC ANALYSIS PERFORMED MANUALLY The tumor cells are POSITIVE for Her2 (3+). Estrogen Receptor: 30%, POSITIVE, WEAK STAINING INTENSITY Progesterone Receptor: 15%, POSITIVE, MODERATE STAINING INTENSITY Proliferation Marker Ki67: 40%     03/13/2021 Initial Diagnosis   Malignant neoplasm of upper-outer quadrant of left breast in female, estrogen receptor positive (HCC)   03/26/2021 Imaging   MRI Breast  IMPRESSION: 1. 2.5 cm biopsy-proven malignancy within the UPPER LEFT breast. 2. Abnormal lymph node with focal cortical thickening in the posterior UPPER-OUTER LEFT breast/LOWER LEFT axilla. 2nd-look ultrasound with possible biopsy is recommended. 3. Indeterminate 0.8 cm OUTER RIGHT breast mass. 2nd-look ultrasound with possible biopsy is recommended.   03/29/2021 Genetic Testing   Negative hereditary cancer genetic testing: no pathogenic variants detected in [facility] CustomNext-Cancer +RNAinsight Panel.  The report date is March 29, 2021.   The CustomNext-Cancer+RNAinsight panel offered by [employer] includes sequencing and rearrangement analysis for the  following 47 genes:  APC, ATM, AXIN2, BARD1, BMPR1A, BRCA1, BRCA2, BRIP1, CDH1, CDK4, CDKN2A, CHEK2, DICER1, EPCAM, GREM1, HOXB13, MEN1, MLH1, MSH2, MSH3, MSH6, MUTYH, NBN, NF1, NF2, NTHL1, PALB2, PMS2, POLD1, POLE, PTEN, RAD51C, RAD51D, RECQL, RET, SDHA, SDHAF2, SDHB, SDHC, SDHD, SMAD4, SMARCA4, STK11, TP53, TSC1, TSC2, and VHL.  RNA data is routinely analyzed for use in variant interpretation for all genes.   04/04/2021 - 07/26/2021 Chemotherapy   Patient is on Treatment Plan : BREAST  Docetaxel + Carboplatin + Trastuzumab + Pertuzumab  (TCHP) q21d      04/14/2021 Imaging   [place] Bilateral Breast  IMPRESSION: 1. No sonographic correlate for the 8 mm enhancing mass in the outer right breast. Recommendation is to prior seed with MRI guided biopsy. 2. An undulating, low lying lymph node along the 1 o'clock axis of the left breast demonstrates no areas of cortical thickening beyond 3 mm.   08/14/2021 - 03/12/2022 Chemotherapy   Patient is on Treatment Plan : BREAST Trastuzumab + Pertuzumab q21d     08/25/2021 Cancer Staging   Staging form: Breast, AJCC 8th Edition - Pathologic stage from 08/25/2021: No Stage Recommended (ypT0, pN0, cM0, G3, ER+, PR+, HER2+) - Signed by Malachy Mood, MD on 09/05/2021 Stage prefix: Post-therapy Response to neoadjuvant therapy: Complete response Nuclear grade: G3 Histologic grading system: 3 grade system   08/25/2021 Definitive Surgery   FINAL MICROSCOPIC DIAGNOSIS:   A. BREAST, LEFT, LUMPECTOMY:  - No evidence of residual carcinoma - complete therapeutic response  - Therapy related changes, including hyalin fibrosis  - Fibrocystic change with urinary  - See comment   B. BREAST, LEFT ADDITIONAL MEDIAL MARGIN, EXCISION:  - Fibrocystic change  -  Negative for carcinoma   C. BREAST, LEFT ADDITIONAL SUPERIOR MARGIN, EXCISION:  - Fibrocystic change  - Negative for carcinoma   D. BREAST, LEFT ADDITIONAL POSTERIOR MARGIN, EXCISION:  - Benign breast parenchyma  -  Negative for carcinoma   E. BREAST, LEFT ADDITIONAL ANTERIOR MARGIN, EXCISION:  - Benign breast parenchyma  - Negative for carcinoma   F. LYMPH NODE, LEFT AXILLARY, SENTINEL, EXCISION:  - Lymph node, negative for carcinoma (0/1)   G. LYMPH NODE, LEFT AXILLARY, SENTINEL, EXCISION:  - Lymph node, negative for carcinoma (0/1)    COMMENT:  The appropriate pathologic stage (AJCC eighth edition) is ypT0 ypN0.       INTERVAL HISTORY:  Joanna Osborn was contacted for a follow up of left breast cancer . She was last seen by Np Lacie on 09/24/2022.  Pt state that she is doing well, but she has some sharp abdominal pains on her left side , like once a month and it will last for 2 days. Pt states that she changed her diet and adding more fruits and vegetables and more water.   All other systems were reviewed with the patient and are negative.  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   Depression    Family history of colon cancer 03/19/2021   History of chemotherapy    left breast cancer  04-04-2021  to 07-26-2021 and restarted 08-14-2021   History of external beam radiation therapy    left breast cancer  10-02-2021  to 11-18-2021   History of kidney stones    Hypertension    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   Malignant neoplasm of upper-outer quadrant of left breast in female, 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   oncologist--- dr Mosetta Putt;  Stage IB, IDC, ER/ PR/ HER2 +;   chemo 04-04-2021  to 07-26-2021 and started 08-14-2021;  raditation completed 11-18-2021   Port-A-Cath in place 04/02/2021   Wears glasses     SURGICAL HISTORY: Past Surgical History:  Procedure Laterality Date   BIOPSY  02/19/2022   Procedure: BIOPSY;  Surgeon: Lemar Lofty., MD;  Location[facility] ENDOSCOPY;  Service: Gastroenterology;;   BREAST LUMPECTOMY WITH RADIOACTIVE SEED AND SENTINEL LYMPH NODE  BIOPSY Left 08/25/2021   Procedure: LEFT BREAST LUMPECTOMY WITH RADIOACTIVE SEED AND AXILLARY SENTINEL LYMPH NODE BIOPSY;  Surgeon: Emelia Loron, MD;  Location: [facility];  Service: General;  Laterality: Left;   CARPAL TUNNEL RELEASE Right 08/17/2008   @[facility]  by dr Merlyn Lot;   and excision wrist ganglian cyst   CHOLECYSTECTOMY, LAPAROSCOPIC  06/27/2002   @[facility]  by dr Lurene Shadow   ESOPHAGOGASTRODUODENOSCOPY (EGD) WITH PROPOFOL N/A 02/19/2022   Procedure: ESOPHAGOGASTRODUODENOSCOPY (EGD) WITH PROPOFOL;  Surgeon: Lemar Lofty., MD;  Location: [facility] ENDOSCOPY;  Service: Gastroenterology;  Laterality: N/A;   ESOPHAGOGASTRODUODENOSCOPY (EGD) WITH PROPOFOL N/A 07/13/2022   Procedure: ESOPHAGOGASTRODUODENOSCOPY (EGD) WITH PROPOFOL;  Surgeon: Meridee Score Netty Starring., MD;  Location: [facility] ENDOSCOPY;  Service: Gastroenterology;  Laterality: N/A;   EUS N/A 02/19/2022   Procedure: UPPER ENDOSCOPIC ULTRASOUND (EUS) RADIAL;  Surgeon: Lemar Lofty., MD;  Location: [facility] ENDOSCOPY;  Service: Gastroenterology;  Laterality: N/A;   FINE NEEDLE ASPIRATION  02/19/2022   Procedure: FINE NEEDLE ASPIRATION (FNA) LINEAR;  Surgeon: Lemar Lofty., MD;  Location: [facility] ENDOSCOPY;  Service: Gastroenterology;;   HYSTEROSCOPY WITH D & C  11/21/2007   @[facility]  by dr Renaldo Fiddler;  polypectomy   INSERTION OF MESH N/A 10/28/2016   Procedure: INSERTION OF MESH;  Surgeon: Berna Bue, MD;  Location: [facility];  Service: General;  Laterality: N/A;   LAPAROSCOPIC SALPINGO OOPHERECTOMY Bilateral 02/02/2022   Procedure: LAPAROSCOPIC BILATERAL SALPINGO OOPHORECTOMY;  Surgeon: Richarda Overlie, MD;  Location: [facility];  Service: Gynecology;  Laterality: Bilateral;   OOPHORECTOMY     PORT-A-CATH REMOVAL Right 08/24/2022   Procedure: REMOVAL PORT-A-CATH;  Surgeon: Emelia Loron, MD;  Location: [facility];  Service: General;  Laterality: Right;   PORTACATH PLACEMENT Right 04/02/2021    Procedure: INSERTION PORT-A-CATH;  Surgeon: Emelia Loron, MD;  Location: [facility];  Service: General;  Laterality: Right;   TUBAL LIGATION  1999   VENTRAL HERNIA REPAIR N/A 10/28/2016   Procedure: LAPAROSCOPIC VENTRAL HERNIA REPAIR;  Surgeon: Berna Bue, MD;  Location: [facility];  Service: General;  Laterality: N/A;    I have reviewed the social history and family history with the patient and they are unchanged from previous note.  ALLERGIES:  is allergic to endocet [oxycodone-acetaminophen], propoxyphene, and tape.  MEDICATIONS:  Current Outpatient Medications  Medication Sig Dispense Refill   Cholecalciferol (VITAMIN D3 PO) Take 1 tablet by mouth daily.     hydrochlorothiazide (HYDRODIURIL) 25 MG tablet Take 1 tablet (25 mg total) by mouth daily. (Patient taking differently: Take 25 mg by mouth daily.) 30 tablet 0   letrozole (FEMARA) 2.5 MG tablet TAKE 1 TABLET BY MOUTH EVERY DAY 90 tablet 1   omeprazole (PRILOSEC) 40 MG capsule TAKE 1 CAPSULE (40 MG TOTAL) BY MOUTH 2 (TWO) TIMES DAILY BEFORE A MEAL. 180 capsule 0   No current facility-administered medications for this visit.    PHYSICAL EXAMINATION: ECOG PERFORMANCE STATUS: 0 - Asymptomatic  There were no vitals filed for this visit. Wt Readings from Last 3 Encounters:  11/02/22 232 lb 2 oz (105.3 kg)  09/24/22 231 lb 8 oz (105 kg)  08/24/22 225 lb 8.5 oz (102.3 kg)     No vitals taken today, Exam not performed today  LABORATORY DATA:  I have reviewed the data as listed    Latest Ref Rng & Units 09/24/2022    9:06 AM 05/25/2022    9:38 AM 03/12/2022    2:20 PM  CBC  WBC 4.0 - 10.5 K/uL 6.1  4.8  4.9   Hemoglobin 12.0 - 15.0 g/dL 16.1  09.6  04.5   Hematocrit 36.0 - 46.0 % 36.9  34.7  31.9   Platelets 150 - 400 K/uL 360  364  300         Latest Ref Rng & Units 09/24/2022    9:06 AM 05/25/2022    9:38 AM 03/12/2022    2:20 PM  CMP  Glucose 70 - 99 mg/dL 409  811  95   BUN 6 - 20 mg/dL  20  26  19    Creatinine 0.44 - 1.00 mg/dL 9.14  7.82  9.56   Sodium 135 - 145 mmol/L 140  140  142   Potassium 3.5 - 5.1 mmol/L 4.0  3.6  3.5   Chloride 98 - 111 mmol/L 107  104  110   CO2 22 - 32 mmol/L 23  28  25    Calcium 8.9 - 10.3 mg/dL 9.6  9.9  9.7   Total Protein 6.5 - 8.1 g/dL 6.9  7.4  7.2   Total Bilirubin 0.3 - 1.2 mg/dL 0.5  0.7  0.7  Alkaline Phos 38 - 126 U/L 98  86  65   AST 15 - 41 U/L 14  20  18    ALT 0 - 44 U/L 17  21  17        RADIOGRAPHIC STUDIES: I have personally reviewed the radiological images as listed and agreed with the findings in the report. No results found.    Orders Placed This Encounter  Procedures   Amb Ref to Medical Weight Management    Referral Priority:   Routine    Referral Type:   Consultation    Number of Visits Requested:   1   All questions were answered. The patient knows to call the clinic with any problems, questions or concerns. No barriers to learning was detected. The total time spent in the appointment was 22 minutes.     Malachy Mood, MD 12/25/2022   Carolin Coy am acting as scribe for Malachy Mood, MD.   I have reviewed the above documentation for accuracy and completeness, and I agree with the above.

## 2022-12-24 NOTE — Assessment & Plan Note (Signed)
-  noted on CT AP on 09/12/21 for work up of diarrhea and abdominal pain. -abdomen MRI on 11/25/21 showed a 2 cm complex cystic mass, suggesting cystic neoplasm.  -EGD/EUS on 02/19/22 with Dr. Meridee Score, cytology from pancreatic head lesion was negative. 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 Given stable size, repeating EUS biopsy is probably not needed.

## 2022-12-24 NOTE — Assessment & Plan Note (Signed)
Stage IB, 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  S/p neoadjuvant TCHP, left lumpectomy, adjuvant radiation, and a year of Herceptin/Perjeta -She began adjuvant Zoladex until BSO and letrozole in 11/2021  -Joanna Osborn is clinically doing well.  Tolerating letrozole with moderate hot flashes, joint stiffness, hair thinning and brittle nails. I recommend skin/hair/nail vitamin or biotin.  Side effects are manageable.

## 2022-12-25 ENCOUNTER — Inpatient Hospital Stay (HOSPITAL_BASED_OUTPATIENT_CLINIC_OR_DEPARTMENT_OTHER): Payer: Federal, State, Local not specified - PPO | Admitting: Hematology

## 2022-12-25 ENCOUNTER — Encounter: Payer: Self-pay | Admitting: Hematology

## 2022-12-25 DIAGNOSIS — K869 Disease of pancreas, unspecified: Secondary | ICD-10-CM | POA: Diagnosis not present

## 2022-12-25 DIAGNOSIS — Z170 Estrogen receptor positive status [ER+]: Secondary | ICD-10-CM

## 2022-12-25 DIAGNOSIS — E669 Obesity, unspecified: Secondary | ICD-10-CM | POA: Diagnosis not present

## 2022-12-25 DIAGNOSIS — C50412 Malignant neoplasm of upper-outer quadrant of left female breast: Secondary | ICD-10-CM | POA: Diagnosis not present

## 2022-12-28 ENCOUNTER — Telehealth: Payer: Self-pay

## 2022-12-28 NOTE — Telephone Encounter (Signed)
-----   Message from Lemar Lofty., MD sent at 12/28/2022  5:35 AM EDT ----- YF, Thanks for sending me this. I am okay with the weight continues to look.  If we do 1 more in 1 year and if stable then we can push out to 2 years and if stable then stop reviewing. Will you plan to repeat the MRI in 1 year? She also is overdue for colon cancer screening we will offer her direct colonoscopy.  Thanks. GM  Joanna Osborn, Please reach out to patient that Dr. Mosetta Putt and I have discussed her MRI and are happy Zehr stable.  She will have a 1 year follow-up MRI.  She needs colon cancer screening so please order this year. Thanks. GM ----- Message ----- From: Malachy Mood, MD Sent: 12/25/2022   1:11 PM EDT To: Lemar Lofty., MD  Gabe,   When you get a chance, please review her recent MRI, to see if you agree with surveillance, I plan to repeat MRI in 6 or 12 months, thanks.  Noelle, her abdominal MRI also showed severe hepatic steatosis, I have referred her to weight management clinic, please follow-up her also.  Malachy Mood   Malachy Mood

## 2022-12-31 ENCOUNTER — Other Ambulatory Visit: Payer: Self-pay

## 2022-12-31 DIAGNOSIS — Z1211 Encounter for screening for malignant neoplasm of colon: Secondary | ICD-10-CM

## 2022-12-31 MED ORDER — NA SULFATE-K SULFATE-MG SULF 17.5-3.13-1.6 GM/177ML PO SOLN: 1.0000 | Freq: Once | ORAL | 0 refills | Status: AC

## 2022-12-31 NOTE — Telephone Encounter (Signed)
Colon scheduled, pt instructed and medications reviewed.  Patient instructions mailed to home.  Patient to call with any questions or concerns.  

## 2022-12-31 NOTE — Telephone Encounter (Signed)
Colon scheduled for 03/15/23 at 945 am at [facility] with GM   Left message on machine to call back

## 2023-01-07 ENCOUNTER — Encounter: Payer: Self-pay | Admitting: Hematology

## 2023-01-16 DIAGNOSIS — M545 Low back pain, unspecified: Secondary | ICD-10-CM | POA: Diagnosis not present

## 2023-01-27 DIAGNOSIS — M545 Low back pain, unspecified: Secondary | ICD-10-CM | POA: Diagnosis not present

## 2023-02-01 ENCOUNTER — Ambulatory Visit: Payer: Federal, State, Local not specified - PPO

## 2023-02-04 ENCOUNTER — Ambulatory Visit: Payer: Federal, State, Local not specified - PPO | Attending: General Surgery

## 2023-02-04 VITALS — Wt 229.2 lb

## 2023-02-04 DIAGNOSIS — Z483 Aftercare following surgery for neoplasm: Secondary | ICD-10-CM | POA: Insufficient documentation

## 2023-02-04 NOTE — Therapy (Signed)
OUTPATIENT PHYSICAL THERAPY SOZO SCREENING NOTE   Patient Name: Joanna Osborn MRN: [id number] DOB:10-26-1970, 52 y.o., female Today's Date: 02/04/2023  PCP: Milus Height, PA REFERRING PROVIDER: Emelia Loron, MD   PT End of Session - 02/04/23 0802     Visit Number 7   # unchanged due to screen only   PT Start Time 0800    PT Stop Time 0804    PT Time Calculation (min) 4 min    Activity Tolerance Patient tolerated treatment well    Behavior During Therapy [facility] for tasks assessed/performed             Past Medical History:  Diagnosis Date   Anemia    Anxiety    Chemotherapy-induced peripheral neuropathy (HCC)    per pt toes   Depression    Family history of colon cancer 03/19/2021   History of chemotherapy    left breast cancer  04-04-2021  to 07-26-2021 and restarted 08-14-2021   History of external beam radiation therapy    left breast cancer  10-02-2021  to 11-18-2021   History of kidney stones    Hypertension    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   Malignant neoplasm of upper-outer quadrant of left breast in female, 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   oncologist--- dr Mosetta Putt;  Stage IB, IDC, ER/ PR/ HER2 +;   chemo 04-04-2021  to 07-26-2021 and started 08-14-2021;  raditation completed 11-18-2021   Port-A-Cath in place 04/02/2021   Wears glasses    Past Surgical History:  Procedure Laterality Date   BIOPSY  02/19/2022   Procedure: BIOPSY;  Surgeon: Meridee Score Netty Starring., MD;  Location: [facility] ENDOSCOPY;  Service: Gastroenterology;;   BREAST LUMPECTOMY WITH RADIOACTIVE SEED AND SENTINEL LYMPH NODE BIOPSY Left 08/25/2021   Procedure: LEFT BREAST LUMPECTOMY WITH RADIOACTIVE SEED AND AXILLARY SENTINEL LYMPH NODE BIOPSY;  Surgeon: Emelia Loron, MD;  Location: [facility];  Service: General;  Laterality: Left;   CARPAL TUNNEL RELEASE Right 08/17/2008   @[facility]  by dr Merlyn Lot;   and excision wrist ganglian cyst   CHOLECYSTECTOMY, LAPAROSCOPIC   06/27/2002   @[facility]  by dr Lurene Shadow   ESOPHAGOGASTRODUODENOSCOPY (EGD) WITH PROPOFOL N/A 02/19/2022   Procedure: ESOPHAGOGASTRODUODENOSCOPY (EGD) WITH PROPOFOL;  Surgeon: Lemar Lofty., MD;  Location: [facility] ENDOSCOPY;  Service: Gastroenterology;  Laterality: N/A;   ESOPHAGOGASTRODUODENOSCOPY (EGD) WITH PROPOFOL N/A 07/13/2022   Procedure: ESOPHAGOGASTRODUODENOSCOPY (EGD) WITH PROPOFOL;  Surgeon: Meridee Score Netty Starring., MD;  Location: [facility] ENDOSCOPY;  Service: Gastroenterology;  Laterality: N/A;   EUS N/A 02/19/2022   Procedure: UPPER ENDOSCOPIC ULTRASOUND (EUS) RADIAL;  Surgeon: Lemar Lofty., MD;  Location: [facility] ENDOSCOPY;  Service: Gastroenterology;  Laterality: N/A;   FINE NEEDLE ASPIRATION  02/19/2022   Procedure: FINE NEEDLE ASPIRATION (FNA) LINEAR;  Surgeon: Lemar Lofty., MD;  Location: [facility] ENDOSCOPY;  Service: Gastroenterology;;   HYSTEROSCOPY WITH D & C  11/21/2007   @[facility]  by dr Renaldo Fiddler;  polypectomy   INSERTION OF MESH N/A 10/28/2016   Procedure: INSERTION OF MESH;  Surgeon: Berna Bue, MD;  Location: [facility];  Service: General;  Laterality: N/A;   LAPAROSCOPIC SALPINGO OOPHERECTOMY Bilateral 02/02/2022   Procedure: LAPAROSCOPIC BILATERAL SALPINGO OOPHORECTOMY;  Surgeon: Richarda Overlie, MD;  Location: [facility];  Service: Gynecology;  Laterality: Bilateral;   OOPHORECTOMY     PORT-A-CATH REMOVAL Right 08/24/2022   Procedure: REMOVAL PORT-A-CATH;  Surgeon: Emelia Loron, MD;  Location: [facility];  Service: General;  Laterality: Right;   PORTACATH  PLACEMENT Right 04/02/2021   Procedure: INSERTION PORT-A-CATH;  Surgeon: Emelia Loron, MD;  Location: [facility];  Service: General;  Laterality: Right;   TUBAL LIGATION  1999   VENTRAL HERNIA REPAIR N/A 10/28/2016   Procedure: LAPAROSCOPIC VENTRAL HERNIA REPAIR;  Surgeon: Berna Bue, MD;  Location: [facility];  Service: General;  Laterality: N/A;   Patient  Active Problem List   Diagnosis Date Noted   Left breast abscess 09/12/2021   Essential hypertension 09/12/2021   Lesion of pancreas 09/12/2021   Hypokalemia 09/12/2021   Genetic testing 04/08/2021   Port-A-Cath in place 04/03/2021   Family history of colon cancer 03/19/2021   Malignant neoplasm of upper-outer quadrant of left breast in female, 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   Suicide ideation 07/07/2013   Major depressive disorder, recurrent severe without psychotic features (HCC) 07/06/2013    REFERRING DIAG: left breast cancer at risk for lymphedema  THERAPY DIAG: Aftercare following surgery for neoplasm  PERTINENT HISTORY: Patient was diagnosed on 03/05/2021 with left grade III invasive ductal carcinoma breast cancer. She underwent neoadjuvant chemotherapy from 04/03/2021-07/24/2021. She had a left lumpectomy and sentinel node biopsy (2 negative nodes) on 08/25/2021. It is triple positive with a Ki67 of 40%.   PRECAUTIONS: left UE Lymphedema risk, None  SUBJECTIVE: Pt returns for her 3 month L-Dex screen.   PAIN:  Are you having pain? No  SOZO SCREENING: Patient was assessed today using the SOZO machine to determine the lymphedema index score. This was compared to her baseline score. It was determined that she is within the recommended range when compared to her baseline and no further action is needed at this time. She will continue SOZO screenings. These are done every 3 months for 2 years post operatively followed by every 6 months for 2 years, and then annually.  Did encourage pt to cont wear of compression sleeve during periods of highly repetitive activities, when increasing her HR (I.e. walking) and/or flying. Pt verbalized good understanding and was agreeable to this.     L-DEX FLOWSHEETS - 02/04/23 0800       L-DEX LYMPHEDEMA SCREENING   Measurement Type Unilateral    L-DEX MEASUREMENT EXTREMITY Upper Extremity    POSITION  Standing    DOMINANT SIDE Right     At Risk Side Left    BASELINE SCORE (UNILATERAL) -2.7    L-DEX SCORE (UNILATERAL) -2.2    VALUE CHANGE (UNILAT) 0.5            P: Begin 6 month L-Dex screens around Jan 2025.   Hermenia Bers, PTA 02/04/2023, 8:03 AM

## 2023-02-09 DIAGNOSIS — M5416 Radiculopathy, lumbar region: Secondary | ICD-10-CM | POA: Diagnosis not present

## 2023-03-08 ENCOUNTER — Other Ambulatory Visit: Payer: Self-pay | Admitting: Hematology

## 2023-03-08 ENCOUNTER — Encounter (HOSPITAL_COMMUNITY): Payer: Self-pay | Admitting: Gastroenterology

## 2023-03-15 ENCOUNTER — Encounter (HOSPITAL_COMMUNITY): Payer: Self-pay | Admitting: Gastroenterology

## 2023-03-15 ENCOUNTER — Ambulatory Visit (HOSPITAL_COMMUNITY)
Admission: RE | Admit: 2023-03-15 | Discharge: 2023-03-15 | Disposition: A | Discharge status: Home / Self Care | Payer: Federal, State, Local not specified - PPO | Source: Ambulatory Visit | Attending: Gastroenterology | Admitting: Gastroenterology

## 2023-03-15 ENCOUNTER — Ambulatory Visit (HOSPITAL_COMMUNITY): Payer: Federal, State, Local not specified - PPO | Admitting: Anesthesiology

## 2023-03-15 ENCOUNTER — Telehealth: Payer: Self-pay

## 2023-03-15 ENCOUNTER — Encounter (HOSPITAL_COMMUNITY)
Admission: RE | Disposition: A | Discharge status: Home / Self Care | Payer: Self-pay | Source: Ambulatory Visit | Attending: Gastroenterology

## 2023-03-15 ENCOUNTER — Other Ambulatory Visit: Payer: Self-pay

## 2023-03-15 DIAGNOSIS — I10 Essential (primary) hypertension: Secondary | ICD-10-CM | POA: Diagnosis not present

## 2023-03-15 DIAGNOSIS — Z1211 Encounter for screening for malignant neoplasm of colon: Secondary | ICD-10-CM

## 2023-03-15 DIAGNOSIS — Z800 Family history of malignant neoplasm of digestive organs: Secondary | ICD-10-CM | POA: Diagnosis not present

## 2023-03-15 DIAGNOSIS — Z79899 Other long term (current) drug therapy: Secondary | ICD-10-CM | POA: Insufficient documentation

## 2023-03-15 DIAGNOSIS — K644 Residual hemorrhoidal skin tags: Secondary | ICD-10-CM | POA: Insufficient documentation

## 2023-03-15 DIAGNOSIS — K5730 Diverticulosis of large intestine without perforation or abscess without bleeding: Secondary | ICD-10-CM | POA: Diagnosis not present

## 2023-03-15 DIAGNOSIS — K641 Second degree hemorrhoids: Secondary | ICD-10-CM | POA: Diagnosis not present

## 2023-03-15 HISTORY — PX: COLONOSCOPY WITH PROPOFOL: SHX5780

## 2023-03-15 SURGERY — COLONOSCOPY WITH PROPOFOL
Anesthesia: Monitor Anesthesia Care

## 2023-03-15 MED ORDER — SODIUM CHLORIDE 0.9 % IV SOLN: INTRAVENOUS | Status: DC

## 2023-03-15 MED ORDER — PROPOFOL 500 MG/50ML IV EMUL
INTRAVENOUS | Status: DC | PRN
  Administered 2023-03-15: 130 ug/kg/min via INTRAVENOUS

## 2023-03-15 MED ORDER — PROPOFOL 10 MG/ML IV BOLUS
INTRAVENOUS | Status: DC | PRN
  Administered 2023-03-15 (×2): 20 mg via INTRAVENOUS
  Administered 2023-03-15: 10 mg via INTRAVENOUS
  Administered 2023-03-15: 20 mg via INTRAVENOUS

## 2023-03-15 MED ORDER — LACTATED RINGERS IV SOLN: INTRAVENOUS | Status: DC

## 2023-03-15 MED ORDER — PROPOFOL 1000 MG/100ML IV EMUL
INTRAVENOUS | Status: AC
  Filled 2023-03-15: qty 100

## 2023-03-15 SURGICAL SUPPLY — 22 items

## 2023-03-15 NOTE — H&P (Signed)
GASTROENTEROLOGY PROCEDURE H&P NOTE   Primary Care Physician: Milus Height, PA  HPI: Joanna Osborn is a 52 y.o. female who presents for Colonoscopy for screening.  Past Medical History:  Diagnosis Date   Anemia    Anxiety    Chemotherapy-induced peripheral neuropathy (HCC)    per pt toes   Depression    Family history of colon cancer 03/19/2021   History of chemotherapy    left breast cancer  04-04-2021  to 07-26-2021 and restarted 08-14-2021   History of external beam radiation therapy    left breast cancer  10-02-2021  to 11-18-2021   History of kidney stones    Hypertension    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   Malignant neoplasm of upper-outer quadrant of left breast in female, 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   oncologist--- dr Mosetta Putt;  Stage IB, IDC, ER/ PR/ HER2 +;   chemo 04-04-2021  to 07-26-2021 and started 08-14-2021;  raditation completed 11-18-2021   Port-A-Cath in place 04/02/2021   Wears glasses    Past Surgical History:  Procedure Laterality Date   BIOPSY  02/19/2022   Procedure: BIOPSY;  Surgeon: Meridee Score Netty Starring., MD;  Location: [facility] ENDOSCOPY;  Service: Gastroenterology;;   BREAST LUMPECTOMY WITH RADIOACTIVE SEED AND SENTINEL LYMPH NODE BIOPSY Left 08/25/2021   Procedure: LEFT BREAST LUMPECTOMY WITH RADIOACTIVE SEED AND AXILLARY SENTINEL LYMPH NODE BIOPSY;  Surgeon: Emelia Loron, MD;  Location: [facility];  Service: General;  Laterality: Left;   CARPAL TUNNEL RELEASE Right 08/17/2008   @[facility]  by dr Merlyn Lot;   and excision wrist ganglian cyst   CHOLECYSTECTOMY, LAPAROSCOPIC  06/27/2002   @[facility]  by dr Lurene Shadow   ESOPHAGOGASTRODUODENOSCOPY (EGD) WITH PROPOFOL N/A 02/19/2022   Procedure: ESOPHAGOGASTRODUODENOSCOPY (EGD) WITH PROPOFOL;  Surgeon: Lemar Lofty., MD;  Location: [facility] ENDOSCOPY;  Service: Gastroenterology;  Laterality: N/A;   ESOPHAGOGASTRODUODENOSCOPY (EGD) WITH PROPOFOL N/A 07/13/2022   Procedure:  ESOPHAGOGASTRODUODENOSCOPY (EGD) WITH PROPOFOL;  Surgeon: Meridee Score Netty Starring., MD;  Location: [facility] ENDOSCOPY;  Service: Gastroenterology;  Laterality: N/A;   EUS N/A 02/19/2022   Procedure: UPPER ENDOSCOPIC ULTRASOUND (EUS) RADIAL;  Surgeon: Lemar Lofty., MD;  Location: [facility] ENDOSCOPY;  Service: Gastroenterology;  Laterality: N/A;   FINE NEEDLE ASPIRATION  02/19/2022   Procedure: FINE NEEDLE ASPIRATION (FNA) LINEAR;  Surgeon: Lemar Lofty., MD;  Location: [facility] ENDOSCOPY;  Service: Gastroenterology;;   HYSTEROSCOPY WITH D & C  11/21/2007   @[facility]  by dr Renaldo Fiddler;  polypectomy   INSERTION OF MESH N/A 10/28/2016   Procedure: INSERTION OF MESH;  Surgeon: Berna Bue, MD;  Location: [facility] OR;  Service: General;  Laterality: N/A;   LAPAROSCOPIC SALPINGO OOPHERECTOMY Bilateral 02/02/2022   Procedure: LAPAROSCOPIC BILATERAL SALPINGO OOPHORECTOMY;  Surgeon: Richarda Overlie, MD;  Location: [facility];  Service: Gynecology;  Laterality: Bilateral;   OOPHORECTOMY     PORT-A-CATH REMOVAL Right 08/24/2022   Procedure: REMOVAL PORT-A-CATH;  Surgeon: Emelia Loron, MD;  Location: [facility];  Service: General;  Laterality: Right;   PORTACATH PLACEMENT Right 04/02/2021   Procedure: INSERTION PORT-A-CATH;  Surgeon: Emelia Loron, MD;  Location: [facility];  Service: General;  Laterality: Right;   TUBAL LIGATION  1999   VENTRAL HERNIA REPAIR N/A 10/28/2016   Procedure: LAPAROSCOPIC VENTRAL HERNIA REPAIR;  Surgeon: Berna Bue, MD;  Location: [facility] OR;  Service: General;  Laterality: N/A;   Current Facility-Administered Medications  Medication Dose Route Frequency Provider Last Rate Last Admin   0.9 %  sodium chloride infusion   Intravenous Continuous Mansouraty, Netty Starring., MD       lactated ringers infusion   Intravenous Continuous Mansouraty, Netty Starring., MD        Current Facility-Administered Medications:    0.9 %  sodium chloride  infusion, , Intravenous, Continuous, Mansouraty, Netty Starring., MD   lactated ringers infusion, , Intravenous, Continuous, Mansouraty, Netty Starring., MD Allergies  Allergen Reactions   Endocet [Oxycodone-Acetaminophen] Itching   Propoxyphene Itching and Other (See Comments)    Darvocet*   Tape Rash   Family History  Problem Relation Age of Onset   Kidney disease Mother    Colon cancer Paternal Aunt    Colon cancer Paternal Grandmother        dx unknown age   Colon cancer Paternal Grandfather        dx after 48   Breast cancer Cousin    Hypertension Other    Cancer Other    Social History   Socioeconomic History   Marital status: Married    Spouse name: Not on file   Number of children: 2   Years of education: Not on file   Highest education level: Not on file  Occupational History   Not on file  Tobacco Use   Smoking status: Never   Smokeless tobacco: Never  Vaping Use   Vaping status: Never Used  Substance and Sexual Activity   Alcohol use: Not Currently    Comment: one glass wine twice monthly   Drug use: Never   Sexual activity: Yes    Birth control/protection: None  Other Topics Concern   Not on file  Social History Narrative   Not on file   Social Determinants of Health   Financial Resource Strain: Not on file  Food Insecurity: No Food Insecurity (03/19/2021)   Hunger Vital Sign    Worried About Running Out of Food in the Last Year: Never true    Ran Out of Food in the Last Year: Never true  Transportation Needs: No Transportation Needs (03/19/2021)   PRAPARE - Administrator, Civil Service (Medical): No    Lack of Transportation (Non-Medical): No  Physical Activity: Not on file  Stress: Not on file  Social Connections: Not on file  Intimate Partner Violence: Not on file    Physical Exam: Today's Vitals   03/08/23 1412  Weight: 104 kg   Body mass index is 38.15 kg/m. GEN: NAD EYE: Sclerae anicteric ENT: MMM CV: Non-tachycardic GI:  Soft, NT/ND NEURO:  Alert & Oriented x 3  Lab Results: No results for input(s): "WBC", "HGB", "HCT", "PLT" in the last 72 hours. BMET No results for input(s): "NA", "K", "CL", "CO2", "GLUCOSE", "BUN", "CREATININE", "CALCIUM" in the last 72 hours. LFT No results for input(s): "PROT", "ALBUMIN", "AST", "ALT", "ALKPHOS", "BILITOT", "BILIDIR", "IBILI" in the last 72 hours. PT/INR No results for input(s): "LABPROT", "INR" in the last 72 hours.   Impression / Plan: This is a 52 y.o.female who presents for Colonoscopy for screening.  The risks and benefits of endoscopic evaluation/treatment were discussed with the patient and/or family; these include but are not limited to the risk of perforation, infection, bleeding, missed lesions, lack of diagnosis, severe illness requiring hospitalization, as well as anesthesia and sedation related illnesses.  The patient's history has been reviewed, patient examined, no change in status, and deemed stable for procedure.  The patient and/or family is agreeable to proceed.    Corliss Parish, MD [facility] Office # [phone]

## 2023-03-15 NOTE — Telephone Encounter (Signed)
-----   Message from [facility] sent at 03/15/2023 12:05 PM EDT ----- Regarding: Followup Aryel Edelen, This patient needs a 5-year colonoscopy recall due to family history. Thanks. GM

## 2023-03-15 NOTE — Op Note (Signed)
[facility] Patient Name: Joanna Osborn Procedure Date: 03/15/2023 MRN: [id number] Attending MD: Corliss Parish , MD, [id number] Date of Birth: 1971/05/08 CSN: [id number] Age: 52 Admit Type: Outpatient Procedure:                Colonoscopy Indications:              Screening for colorectal malignant neoplasm;                            multiple second-degree relatives with colon cancer Providers:                Corliss Parish, MD, Norman Clay, RN, Beryle Beams, Technician, Albertina Senegal. Alday CRNA, CRNA Referring MD:              Medicines:                Monitored Anesthesia Care Complications:            No immediate complications. Estimated Blood Loss:     Estimated blood loss was minimal. Estimated blood                            loss: none. Procedure:                Pre-Anesthesia Assessment:                           - Prior to the procedure, a History and Physical                            was performed, and patient medications and                            allergies were reviewed. The patient's tolerance of                            previous anesthesia was also reviewed. The risks                            and benefits of the procedure and the sedation                            options and risks were discussed with the patient.                            All questions were answered, and informed consent                            was obtained. Prior Anticoagulants: The patient has                            taken no anticoagulant or antiplatelet agents. ASA  Grade Assessment: III - A patient with severe                            systemic disease. After reviewing the risks and                            benefits, the patient was deemed in satisfactory                            condition to undergo the procedure.                           After obtaining informed consent, the colonoscope                             was passed under direct vision. Throughout the                            procedure, the patient's blood pressure, pulse, and                            oxygen saturations were monitored continuously. The                            CF-HQ190L ([id number]) Olympus colonoscope was                            introduced through the anus and advanced to the the                            cecum, identified by appendiceal orifice and                            ileocecal valve. The colonoscopy was performed                            without difficulty. The patient tolerated the                            procedure. The quality of the bowel preparation was                            adequate. The ileocecal valve, appendiceal orifice,                            and rectum were photographed. Scope In: 10:30:39 AM Scope Out: 10:46:16 AM Scope Withdrawal Time: 0 hours 9 minutes 7 seconds  Total Procedure Duration: 0 hours 15 minutes 37 seconds  Findings:      Skin tags were found on perianal exam.      The digital rectal exam findings include hemorrhoids. Pertinent       negatives include no palpable rectal lesions.      The colon (entire examined portion) revealed moderately excessive       looping.      A  few small-mouthed diverticula were found in the recto-sigmoid colon       and sigmoid colon.      Normal mucosa was found in the entire colon otherwise.      Non-bleeding non-thrombosed external and internal hemorrhoids were found       during retroflexion, during perianal exam and during digital exam. The       hemorrhoids were Grade II (internal hemorrhoids that prolapse but reduce       spontaneously). Impression:               - Perianal skin tags found on perianal exam.                           - Hemorrhoids found on digital rectal exam.                           - There was significant looping of the colon.                           - Diverticulosis in the recto-sigmoid colon  and in                            the sigmoid colon.                           - Normal mucosa in the entire examined colon.                           - Non-bleeding non-thrombosed external and internal                            hemorrhoids. Moderate Sedation:      Not Applicable - Patient had care per Anesthesia. Recommendation:           - The patient will be observed post-procedure,                            until all discharge criteria are met.                           - Discharge patient to home.                           - Patient has a contact number available for                            emergencies. The signs and symptoms of potential                            delayed complications were discussed with the                            patient. Return to normal activities tomorrow.                            Written discharge instructions were provided to the  patient.                           - High fiber diet.                           - Use FiberCon 1-2 tablets PO daily.                           - Continue present medications.                           - Repeat colonoscopy in 5 years for screening                            purposes due to multiple second-degree relatives                            with colon cancer. Consider abdominal binder for                            next procedure.                           - The findings and recommendations were discussed                            with the patient.                           - The findings and recommendations were discussed                            with the designated responsible adult. Procedure Code(s):        --- Professional ---                           Z6109, Colorectal cancer screening; colonoscopy on                            individual not meeting criteria for high risk Diagnosis Code(s):        --- Professional ---                           Z12.11, Encounter for screening  for malignant                            neoplasm of colon                           K64.1, Second degree hemorrhoids                           K64.4, Residual hemorrhoidal skin tags                           K57.30, Diverticulosis of large intestine without  perforation or abscess without bleeding CPT copyright 2022 American Medical Association. All rights reserved. The codes documented in this report are preliminary and upon coder review may  be revised to meet current compliance requirements. Corliss Parish, MD 03/15/2023 10:51:36 AM Number of Addenda: 0

## 2023-03-15 NOTE — Transfer of Care (Addendum)
Immediate Anesthesia Transfer of Care Note  Patient: Joanna Osborn  Procedure(s) Performed: COLONOSCOPY WITH PROPOFOL  Patient Location: PACU  Anesthesia Type:MAC  Level of Consciousness: sedated  Airway & Oxygen Therapy: Patient Spontanous Breathing and Patient connected to face mask oxygen  Post-op Assessment: Report given to RN and Post -op Vital signs reviewed and stable  Post vital signs: Reviewed and stable  Last Vitals:  Vitals Value Taken Time  BP    Temp    Pulse    Resp    SpO2      Last Pain: There were no vitals filed for this visit.       Complications: No notable events documented.

## 2023-03-15 NOTE — Anesthesia Preprocedure Evaluation (Addendum)
Anesthesia Evaluation  Patient identified by MRN, date of birth, ID band Patient awake    Reviewed: Allergy & Precautions, NPO status , Patient's Chart, lab work & pertinent test results  Airway Mallampati: II  TM Distance: >3 FB Neck ROM: Full    Dental  (+) Dental Advisory Given, Teeth Intact   Pulmonary neg pulmonary ROS   Pulmonary exam normal breath sounds clear to auscultation       Cardiovascular hypertension, Pt. on medications Normal cardiovascular exam Rhythm:Regular Rate:Normal  Echo 01/2022 1. Compared with the echo 08/2021, GLS is slightly worse. It has increased from -19.1% to -16.5%. Systolic function is unchanged. Left ventricular ejection fraction, by estimation, is 60 to 65%. The left ventricle has normal function. The left ventricle has no regional wall motion abnormalities. There is mild left ventricular hypertrophy of the septal segment. Left ventricular diastolic parameters are consistent with Grade I diastolic dysfunction (impaired relaxation). The average left ventricular global longitudinal strain is -16.5 %. The global longitudinal strain is abnormal.   2. Right ventricular systolic function is normal. The right ventricular size is normal.   3. The mitral valve is normal in structure. No evidence of mitral valve regurgitation. No evidence of mitral stenosis.   4. The aortic valve is tricuspid. Aortic valve regurgitation is not visualized. No aortic stenosis is present.   5. The inferior vena cava is normal in size with greater than 50% respiratory variability, suggesting right atrial pressure of 3 mmHg.     Neuro/Psych  PSYCHIATRIC DISORDERS Anxiety Depression    negative neurological ROS     GI/Hepatic Neg liver ROS, PUD,,,  Endo/Other  negative endocrine ROS    Renal/GU negative Renal ROS     Musculoskeletal negative musculoskeletal ROS (+)    Abdominal  (+) + obese  Peds  Hematology  (+) Blood  dyscrasia, anemia   Anesthesia Other Findings   Reproductive/Obstetrics                             Anesthesia Physical Anesthesia Plan  ASA: 3  Anesthesia Plan: MAC   Post-op Pain Management:    Induction: Intravenous  PONV Risk Score and Plan: 2 and Propofol infusion and Treatment may vary due to age or medical condition  Airway Management Planned: Simple Face Mask, Natural Airway and Nasal Cannula  Additional Equipment: None  Intra-op Plan:   Post-operative Plan:   Informed Consent: I have reviewed the patients History and Physical, chart, labs and discussed the procedure including the risks, benefits and alternatives for the proposed anesthesia with the patient or authorized representative who has indicated his/her understanding and acceptance.     Dental advisory given  Plan Discussed with: CRNA  Anesthesia Plan Comments:        Anesthesia Quick Evaluation

## 2023-03-15 NOTE — Telephone Encounter (Signed)
Recall has been entered as ordered  

## 2023-03-15 NOTE — Discharge Instructions (Signed)

## 2023-03-15 NOTE — Anesthesia Postprocedure Evaluation (Signed)
Anesthesia Post Note  Patient: Joanna Osborn  Procedure(s) Performed: COLONOSCOPY WITH PROPOFOL     Patient location during evaluation: PACU Anesthesia Type: MAC Level of consciousness: awake and alert Pain management: pain level controlled Vital Signs Assessment: post-procedure vital signs reviewed and stable Respiratory status: spontaneous breathing Cardiovascular status: stable Anesthetic complications: no   No notable events documented.  Last Vitals:  Vitals:   03/15/23 1112 03/15/23 1118  BP: 123/83 129/81  Pulse: 82 78  Resp: 15 20  Temp:    SpO2: 99% 96%    Last Pain:  Vitals:   03/15/23 1112  TempSrc:   PainSc: (P) 0-No pain                 Lewie Loron

## 2023-03-19 ENCOUNTER — Encounter (HOSPITAL_COMMUNITY): Payer: Self-pay | Admitting: Gastroenterology

## 2023-03-29 NOTE — Progress Notes (Unsigned)
Patient Care Team: Milus Height, [place] as PCP - General (Physician Assistant) Donnelly Angelica, RN as Oncology Nurse Navigator Pershing Proud, RN as Oncology Nurse Navigator Emelia Loron, MD as Consulting Physician (General Surgery) Malachy Mood, MD as Consulting Physician (Hematology) Dorothy Puffer, MD as Consulting Physician (Radiation Oncology) Malachy Mood, MD as Consulting Physician (Hematology) Imaging, [facility] as Consulting Physician (Diagnostic Radiology)   CHIEF COMPLAINT: Follow up left breast cancer   Oncology History Overview Note   Cancer Staging  Malignant neoplasm of upper-outer quadrant of left breast in female, estrogen receptor positive (HCC) Staging form: Breast, AJCC 8th Edition - Clinical stage from 03/07/2021: Stage IB (cT2, cN0, cM0, G3, ER+, PR+, HER2+) - Signed by Malachy Mood, MD on 03/17/2021 Stage prefix: Initial diagnosis Histologic grading system: 3 grade system - Pathologic stage from 08/25/2021: No Stage Recommended (ypT0, pN0, cM0, G3, ER+, PR+, HER2+) - Signed by Malachy Mood, MD on 09/05/2021 Stage prefix: Post-therapy Response to neoadjuvant therapy: Complete response Nuclear grade: G3 Histologic grading system: 3 grade system     Malignant neoplasm of upper-outer quadrant of left breast in female, estrogen receptor positive (HCC)  03/05/2021 Mammogram   Diagnostic Left Mammogram; Left Breast Ultrasound  IMPRESSION: At the palpable site of concern in the left breast at 12 o'clock there is a suspicious mass measuring 2.1 cm.   03/07/2021 Cancer Staging   Staging form: Breast, AJCC 8th Edition - Clinical stage from 03/07/2021: Stage IB (cT2, cN0, cM0, G3, ER+, PR+, HER2+) - Signed by Malachy Mood, MD on 03/17/2021 Stage prefix: Initial diagnosis Histologic grading system: 3 grade system   03/07/2021 Pathology Results   Diagnosis Breast, left, needle core biopsy, left breast 12 o' clock - INVASIVE DUCTAL CARCINOMA - SEE  COMMENT Based on the biopsy, the carcinoma appears Nottingham grade 3 of 3  PROGNOSTIC INDICATORS Results: IMMUNOHISTOCHEMICAL AND MORPHOMETRIC ANALYSIS PERFORMED MANUALLY The tumor cells are POSITIVE for Her2 (3+). Estrogen Receptor: 30%, POSITIVE, WEAK STAINING INTENSITY Progesterone Receptor: 15%, POSITIVE, MODERATE STAINING INTENSITY Proliferation Marker Ki67: 40%     03/13/2021 Initial Diagnosis   Malignant neoplasm of upper-outer quadrant of left breast in female, estrogen receptor positive (HCC)   03/26/2021 Imaging   MRI Breast  IMPRESSION: 1. 2.5 cm biopsy-proven malignancy within the UPPER LEFT breast. 2. Abnormal lymph node with focal cortical thickening in the posterior UPPER-OUTER LEFT breast/LOWER LEFT axilla. 2nd-look ultrasound with possible biopsy is recommended. 3. Indeterminate 0.8 cm OUTER RIGHT breast mass. 2nd-look ultrasound with possible biopsy is recommended.   03/29/2021 Genetic Testing   Negative hereditary cancer genetic testing: no pathogenic variants detected in [employer] CustomNext-Cancer +RNAinsight Panel.  The report date is March 29, 2021.   The CustomNext-Cancer+RNAinsight panel offered by [employer] [facility] includes sequencing and rearrangement analysis for the following 47 genes:  APC, ATM, AXIN2, BARD1, BMPR1A, BRCA1, BRCA2, BRIP1, CDH1, CDK4, CDKN2A, CHEK2, DICER1, EPCAM, GREM1, HOXB13, MEN1, MLH1, MSH2, MSH3, MSH6, MUTYH, NBN, NF1, NF2, NTHL1, PALB2, PMS2, POLD1, POLE, PTEN, RAD51C, RAD51D, RECQL, RET, SDHA, SDHAF2, SDHB, SDHC, SDHD, SMAD4, SMARCA4, STK11, TP53, TSC1, TSC2, and VHL.  RNA data is routinely analyzed for use in variant interpretation for all genes.   04/04/2021 - 07/26/2021 Chemotherapy   Patient is on Treatment Plan : BREAST  Docetaxel + Carboplatin + Trastuzumab + Pertuzumab  (TCHP) q21d      04/14/2021 Imaging   [place] Bilateral Breast  IMPRESSION: 1. No sonographic correlate for the 8 mm enhancing mass in the outer  right breast.  Recommendation is to prior seed with MRI guided biopsy. 2. An undulating, low lying lymph node along the 1 o'clock axis of the left breast demonstrates no areas of cortical thickening beyond 3 mm.   08/14/2021 - 03/12/2022 Chemotherapy   Patient is on Treatment Plan : BREAST Trastuzumab + Pertuzumab q21d     08/25/2021 Cancer Staging   Staging form: Breast, AJCC 8th Edition - Pathologic stage from 08/25/2021: No Stage Recommended (ypT0, pN0, cM0, G3, ER+, PR+, HER2+) - Signed by Malachy Mood, MD on 09/05/2021 Stage prefix: Post-therapy Response to neoadjuvant therapy: Complete response Nuclear grade: G3 Histologic grading system: 3 grade system   08/25/2021 Definitive Surgery   FINAL MICROSCOPIC DIAGNOSIS:   A. BREAST, LEFT, LUMPECTOMY:  - No evidence of residual carcinoma - complete therapeutic response  - Therapy related changes, including hyalin fibrosis  - Fibrocystic change with urinary  - See comment   B. BREAST, LEFT ADDITIONAL MEDIAL MARGIN, EXCISION:  - Fibrocystic change  - Negative for carcinoma   C. BREAST, LEFT ADDITIONAL SUPERIOR MARGIN, EXCISION:  - Fibrocystic change  - Negative for carcinoma   D. BREAST, LEFT ADDITIONAL POSTERIOR MARGIN, EXCISION:  - Benign breast parenchyma  - Negative for carcinoma   E. BREAST, LEFT ADDITIONAL ANTERIOR MARGIN, EXCISION:  - Benign breast parenchyma  - Negative for carcinoma   F. LYMPH NODE, LEFT AXILLARY, SENTINEL, EXCISION:  - Lymph node, negative for carcinoma (0/1)   G. LYMPH NODE, LEFT AXILLARY, SENTINEL, EXCISION:  - Lymph node, negative for carcinoma (0/1)    COMMENT:  The appropriate pathologic stage (AJCC eighth edition) is ypT0 ypN0.       CURRENT THERAPY: Letrozole, starting 11/2021  INTERVAL HISTORY Ms. Joanna Osborn returns for follow up as scheduled. Seen virtually by Dr. Mosetta Putt 12/25/22. Colonoscopy 03/15/23 showed hemorrhoids, 5 year recall due to family history.   ROS   Past Medical History:  Diagnosis Date    Anemia    Anxiety    Chemotherapy-induced peripheral neuropathy (HCC)    per pt toes   Depression    Family history of colon cancer 03/19/2021   History of chemotherapy    left breast cancer  04-04-2021  to 07-26-2021 and restarted 08-14-2021   History of external beam radiation therapy    left breast cancer  10-02-2021  to 11-18-2021   History of kidney stones    Hypertension    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   Malignant neoplasm of upper-outer quadrant of left breast in female, 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   oncologist--- dr Mosetta Putt;  Stage IB, IDC, ER/ PR/ HER2 +;   chemo 04-04-2021  to 07-26-2021 and started 08-14-2021;  raditation completed 11-18-2021   Port-A-Cath in place 04/02/2021   Wears glasses      Past Surgical History:  Procedure Laterality Date   BIOPSY  02/19/2022   Procedure: BIOPSY;  Surgeon: Meridee Score Netty Starring., MD;  Location: [facility] END[facility]COPY;  Service: Gastroenterology;;   BREAST LUMPECTOMY WITH RADIOACTIVE SEED AND SENTINEL LYMPH NODE BIOPSY Left 08/25/2021   Procedure: LEFT BREAST LUMPECTOMY WITH RADIOACTIVE SEED AND AXILLARY SENTINEL LYMPH NODE BIOPSY;  Surgeon: Emelia Loron, MD;  Location: [facility];  Service: General;  Laterality: Left;   CARPAL TUNNEL RELEASE Right 08/17/2008   @[facility]  by dr Merlyn Lot;   and excision wrist ganglian cyst   CHOLECYSTECTOMY, LAPAROSCOPIC  06/27/2002   @[facility]  by dr Lurene Shadow   COLONOSCOPY WITH PROPOFOL N/A 03/15/2023   Procedure: COLONOSCOPY WITH PROPOFOL;  Surgeon: Meridee Score,  Netty Starring., MD;  Location: [facility];  Service: Gastroenterology;  Laterality: N/A;   ESOPHAGOGASTRODUODENOSCOPY (EGD) WITH PROPOFOL N/A 02/19/2022   Procedure: ESOPHAGOGASTRODUODENOSCOPY (EGD) WITH PROPOFOL;  Surgeon: Meridee Score Netty Starring., MD;  Location: [facility] ENDOSCOPY;  Service: Gastroenterology;  Laterality: N/A;   ESOPHAGOGASTRODUODENOSCOPY (EGD) WITH PROPOFOL N/A 07/13/2022   Procedure: ESOPHAGOGASTRODUODENOSCOPY (EGD) WITH  PROPOFOL;  Surgeon: Meridee Score Netty Starring., MD;  Location: [facility] ENDOSCOPY;  Service: Gastroenterology;  Laterality: N/A;   EUS N/A 02/19/2022   Procedure: UPPER ENDOSCOPIC ULTRASOUND (EUS) RADIAL;  Surgeon: Lemar Lofty., MD;  Location: [facility] ENDOSCOPY;  Service: Gastroenterology;  Laterality: N/A;   FINE NEEDLE ASPIRATION  02/19/2022   Procedure: FINE NEEDLE ASPIRATION (FNA) LINEAR;  Surgeon: Lemar Lofty., MD;  Location: [facility] ENDOSCOPY;  Service: Gastroenterology;;   HYSTEROSCOPY WITH D & C  11/21/2007   @[facility]  by dr Renaldo Fiddler;  polypectomy   INSERTION OF MESH N/A 10/28/2016   Procedure: INSERTION OF MESH;  Surgeon: Berna Bue, MD;  Location: [facility];  Service: General;  Laterality: N/A;   LAPAROSCOPIC SALPINGO OOPHERECTOMY Bilateral 02/02/2022   Procedure: LAPAROSCOPIC BILATERAL SALPINGO OOPHORECTOMY;  Surgeon: Richarda Overlie, MD;  Location: [facility];  Service: Gynecology;  Laterality: Bilateral;   OOPHORECTOMY     PORT-A-CATH REMOVAL Right 08/24/2022   Procedure: REMOVAL PORT-A-CATH;  Surgeon: Emelia Loron, MD;  Location: [facility];  Service: General;  Laterality: Right;   PORTACATH PLACEMENT Right 04/02/2021   Procedure: INSERTION PORT-A-CATH;  Surgeon: Emelia Loron, MD;  Location: [facility];  Service: General;  Laterality: Right;   TUBAL LIGATION  1999   VENTRAL HERNIA REPAIR N/A 10/28/2016   Procedure: LAPAROSCOPIC VENTRAL HERNIA REPAIR;  Surgeon: Berna Bue, MD;  Location: [facility];  Service: General;  Laterality: N/A;     Outpatient Encounter Medications as of 04/01/2023  Medication Sig   Cholecalciferol (VITAMIN D3 PO) Take 1 tablet by mouth daily.   hydrochlorothiazide (HYDRODIURIL) 25 MG tablet Take 1 tablet (25 mg total) by mouth daily. (Patient taking differently: Take 25 mg by mouth daily.)   letrozole (FEMARA) 2.5 MG tablet TAKE 1 TABLET BY MOUTH EVERY DAY   omeprazole (PRILOSEC) 40 MG capsule TAKE 1  CAPSULE (40 MG TOTAL) BY MOUTH 2 (TWO) TIMES DAILY BEFORE A MEAL.   [DISCONTINUED] prochlorperazine (COMPAZINE) 10 MG tablet Take 1 tablet (10 mg total) by mouth every 6 (six) hours as needed (Nausea or vomiting).   No facility-administered encounter medications on file as of 04/01/2023.     There were no vitals filed for this visit. There is no height or weight on file to calculate BMI.   PHYSICAL EXAM GENERAL:alert, no distress and comfortable SKIN: no rash  EYES: sclera clear NECK: without mass LYMPH:  no palpable cervical or supraclavicular lymphadenopathy  LUNGS: clear with normal breathing effort HEART: regular rate & rhythm, no lower extremity edema ABDOMEN: abdomen soft, non-tender and normal bowel sounds NEURO: alert & oriented x 3 with fluent speech, no focal motor/sensory deficits Breast exam:  PAC without erythema    CBC    Component Value Date/Time   WBC 6.1 09/24/2022 0906   WBC 5.3 02/24/2022 1117   RBC 4.30 09/24/2022 0906   HGB 12.4 09/24/2022 0906   HCT 36.9 09/24/2022 0906   PLT 360 09/24/2022 0906   MCV 85.8 09/24/2022 0906   MCH 28.8 09/24/2022 0906   MCHC 33.6 09/24/2022 0906   RDW 13.4 09/24/2022 0906   LYMPHSABS 2.0 09/24/2022 0906  MONOABS 0.3 09/24/2022 0906   EOSABS 0.1 09/24/2022 0906   BASOSABS 0.0 09/24/2022 0906     CMP     Component Value Date/Time   NA 140 09/24/2022 0906   K 4.0 09/24/2022 0906   CL 107 09/24/2022 0906   CO2 23 09/24/2022 0906   GLUCOSE 118 (H) 09/24/2022 0906   BUN 20 09/24/2022 0906   CREATININE 0.63 09/24/2022 0906   CALCIUM 9.6 09/24/2022 0906   PROT 6.9 09/24/2022 0906   ALBUMIN 4.3 09/24/2022 0906   AST 14 (L) 09/24/2022 0906   ALT 17 09/24/2022 0906   ALKPHOS 98 09/24/2022 0906   BILITOT 0.5 09/24/2022 0906   GFRNONAA >60 09/24/2022 0906   GFRAA >60 04/29/2018 2023     ASSESSMENT & PLAN:Remas F Tullio is a 51 y.o. female with    1. Malignant neoplasm of upper-outer quadrant of left breast, Stage IB,  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  S/p neoadjuvant TCHP, left lumpectomy, adjuvant radiation, and a year of Herceptin/Perjeta -She began adjuvant Zoladex until BSO and letrozole in 11/2021  - Recent mammogram 06/2022 is benign.  Overall no clinical concern for recurrence.   2.  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 -abdomen MRI on 11/25/21 showed a 2 cm complex cystic mass, suggesting cystic neoplasm.  -EGD/EUS on 02/19/22 with Dr. Meridee Score, cytology from pancreatic head lesion was negative. -surveillance MRI 06/08/22 showed stable lesion without discrete enhancing nodularity, and fatty liver; MRI 12/14/22 was stable    3. Anxiety and depression, HTN -f/u PCP -continue HCTZ -Reviewed SSRI as possible treatment for hot flashes, she will consider   4. Mild Anemia -Secondary to chemo -Resolved  PLAN:  No orders of the defined types were placed in this encounter.     All questions were answered. The patient knows to call the clinic with any problems, questions or concerns. No barriers to learning were detected. I spent *** counseling the patient face to face. The total time spent in the appointment was *** and more than 50% was on counseling, review of test results, and coordination of care.   Santiago Glad, NP-C @DATE @

## 2023-04-01 ENCOUNTER — Encounter: Payer: Self-pay | Admitting: Nurse Practitioner

## 2023-04-01 ENCOUNTER — Inpatient Hospital Stay: Payer: Federal, State, Local not specified - PPO | Attending: Hematology | Admitting: Nurse Practitioner

## 2023-04-01 ENCOUNTER — Inpatient Hospital Stay: Payer: Federal, State, Local not specified - PPO

## 2023-04-01 VITALS — BP 120/86 | HR 81 | Temp 97.7°F | Resp 20 | Wt 228.9 lb

## 2023-04-01 DIAGNOSIS — G8929 Other chronic pain: Secondary | ICD-10-CM | POA: Diagnosis not present

## 2023-04-01 DIAGNOSIS — I890 Lymphedema, not elsewhere classified: Secondary | ICD-10-CM | POA: Diagnosis not present

## 2023-04-01 DIAGNOSIS — Z170 Estrogen receptor positive status [ER+]: Secondary | ICD-10-CM | POA: Diagnosis not present

## 2023-04-01 DIAGNOSIS — C50412 Malignant neoplasm of upper-outer quadrant of left female breast: Secondary | ICD-10-CM

## 2023-04-01 DIAGNOSIS — Z79811 Long term (current) use of aromatase inhibitors: Secondary | ICD-10-CM | POA: Diagnosis not present

## 2023-04-01 DIAGNOSIS — K869 Disease of pancreas, unspecified: Secondary | ICD-10-CM | POA: Insufficient documentation

## 2023-04-01 DIAGNOSIS — M25551 Pain in right hip: Secondary | ICD-10-CM | POA: Insufficient documentation

## 2023-04-01 DIAGNOSIS — M549 Dorsalgia, unspecified: Secondary | ICD-10-CM | POA: Diagnosis not present

## 2023-04-01 DIAGNOSIS — Z923 Personal history of irradiation: Secondary | ICD-10-CM | POA: Insufficient documentation

## 2023-04-01 DIAGNOSIS — Z9221 Personal history of antineoplastic chemotherapy: Secondary | ICD-10-CM | POA: Insufficient documentation

## 2023-04-01 LAB — CMP (CANCER CENTER ONLY)
ALT: 22 U/L (ref 0–44)
AST: 18 U/L (ref 15–41)
Albumin: 4.4 g/dL (ref 3.5–5.0)
Alkaline Phosphatase: 78 U/L (ref 38–126)
Anion gap: 10 (ref 5–15)
BUN: 21 mg/dL — ABNORMAL HIGH (ref 6–20)
CO2: 26 mmol/L (ref 22–32)
Calcium: 10.2 mg/dL (ref 8.9–10.3)
Chloride: 107 mmol/L (ref 98–111)
Creatinine: 0.73 mg/dL (ref 0.44–1.00)
GFR, Estimated: >60 mL/min (ref >60)
Glucose, Bld: 132 mg/dL — ABNORMAL HIGH (ref 70–99)
Potassium: 4 mmol/L (ref 3.5–5.1)
Sodium: 143 mmol/L (ref 135–145)
Total Bilirubin: 0.5 mg/dL (ref 0.3–1.2)
Total Protein: 7.3 g/dL (ref 6.5–8.1)

## 2023-04-01 LAB — CBC WITH DIFFERENTIAL (CANCER CENTER ONLY)
Abs Immature Granulocytes: 0.02 10*3/uL (ref 0.00–0.07)
Basophils Absolute: 0 10*3/uL (ref 0.0–0.1)
Basophils Relative: 1 %
Eosinophils Absolute: 0.2 10*3/uL (ref 0.0–0.5)
Eosinophils Relative: 2 %
HCT: 39.2 % (ref 36.0–46.0)
Hemoglobin: 12.7 g/dL (ref 12.0–15.0)
Immature Granulocytes: 0 %
Lymphocytes Relative: 38 %
Lymphs Abs: 2.4 10*3/uL (ref 0.7–4.0)
MCH: 28.7 pg (ref 26.0–34.0)
MCHC: 32.4 g/dL (ref 30.0–36.0)
MCV: 88.7 fL (ref 80.0–100.0)
Monocytes Absolute: 0.3 10*3/uL (ref 0.1–1.0)
Monocytes Relative: 5 %
Neutro Abs: 3.4 10*3/uL (ref 1.7–7.7)
Neutrophils Relative %: 54 %
Platelet Count: 369 10*3/uL (ref 150–400)
RBC: 4.42 MIL/uL (ref 3.87–5.11)
RDW: 13.3 % (ref 11.5–15.5)
WBC Count: 6.3 10*3/uL (ref 4.0–10.5)
nRBC: 0 % (ref 0.0–0.2)

## 2023-04-01 MED ORDER — GABAPENTIN 100 MG PO CAPS: 100.0000 mg | ORAL_CAPSULE | Freq: Every day | ORAL | 3 refills | Status: AC

## 2023-04-01 MED ORDER — LETROZOLE 2.5 MG PO TABS: 2.5000 mg | ORAL_TABLET | Freq: Every day | ORAL | 3 refills | Status: DC

## 2023-04-15 DIAGNOSIS — F4323 Adjustment disorder with mixed anxiety and depressed mood: Secondary | ICD-10-CM | POA: Diagnosis not present

## 2023-04-22 DIAGNOSIS — F4323 Adjustment disorder with mixed anxiety and depressed mood: Secondary | ICD-10-CM | POA: Diagnosis not present

## 2023-04-29 DIAGNOSIS — F4323 Adjustment disorder with mixed anxiety and depressed mood: Secondary | ICD-10-CM | POA: Diagnosis not present

## 2023-05-10 ENCOUNTER — Ambulatory Visit: Payer: Federal, State, Local not specified - PPO | Attending: General Surgery

## 2023-05-10 VITALS — Wt 228.4 lb

## 2023-05-10 DIAGNOSIS — Z483 Aftercare following surgery for neoplasm: Secondary | ICD-10-CM | POA: Insufficient documentation

## 2023-05-10 NOTE — Therapy (Signed)
OUTPATIENT PHYSICAL THERAPY SOZO SCREENING NOTE   Patient Name: Joanna Osborn MRN: [id number] DOB:[birth year], 52 y.o., female Today's Date: 05/10/2023  PCP: Milus Height, PA REFERRING PROVIDER: Emelia Loron, MD   PT End of Session - 05/10/23 0919     Visit Number 7   # unchanged due to screen only   PT Start Time 0917    PT Stop Time 0921    PT Time Calculation (min) 4 min    Activity Tolerance Patient tolerated treatment well    Behavior During Therapy [facility] for tasks assessed/performed             Past Medical History:  Diagnosis Date   Anemia    Anxiety    Chemotherapy-induced peripheral neuropathy (HCC)    per pt toes   Depression    Family history of colon cancer 03/19/2021   History of chemotherapy    left breast cancer  04-04-2021  to 07-26-2021 and restarted 08-14-2021   History of external beam radiation therapy    left breast cancer  10-02-2021  to 11-18-2021   History of kidney stones    Hypertension    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   Malignant neoplasm of upper-outer quadrant of left breast in female, 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   oncologist--- dr Mosetta Putt;  Stage IB, IDC, ER/ PR/ HER2 +;   chemo 04-04-2021  to 07-26-2021 and started 08-14-2021;  raditation completed 11-18-2021   Port-A-Cath in place 04/02/2021   Wears glasses    Past Surgical History:  Procedure Laterality Date   BIOPSY  02/19/2022   Procedure: BIOPSY;  Surgeon: Meridee Score Netty Starring., MD;  Location: [facility] ENDOSCOPY;  Service: Gastroenterology;;   BREAST LUMPECTOMY WITH RADIOACTIVE SEED AND SENTINEL LYMPH NODE BIOPSY Left 08/25/2021   Procedure: LEFT BREAST LUMPECTOMY WITH RADIOACTIVE SEED AND AXILLARY SENTINEL LYMPH NODE BIOPSY;  Surgeon: Emelia Loron, MD;  Location: [facility];  Service: General;  Laterality: Left;   CARPAL TUNNEL RELEASE Right 08/17/2008   @[facility]  by dr Merlyn Lot;   and excision wrist ganglian cyst   CHOLECYSTECTOMY, LAPAROSCOPIC   06/27/2002   @[facility]  by dr Lurene Shadow   COLONOSCOPY WITH PROPOFOL N/A 03/15/2023   Procedure: COLONOSCOPY WITH PROPOFOL;  Surgeon: Lemar Lofty., MD;  Location: [facility];  Service: Gastroenterology;  Laterality: N/A;   ESOPHAGOGASTRODUODENOSCOPY (EGD) WITH PROPOFOL N/A 02/19/2022   Procedure: ESOPHAGOGASTRODUODENOSCOPY (EGD) WITH PROPOFOL;  Surgeon: Meridee Score Netty Starring., MD;  Location: [facility] ENDOSCOPY;  Service: Gastroenterology;  Laterality: N/A;   ESOPHAGOGASTRODUODENOSCOPY (EGD) WITH PROPOFOL N/A 07/13/2022   Procedure: ESOPHAGOGASTRODUODENOSCOPY (EGD) WITH PROPOFOL;  Surgeon: Meridee Score Netty Starring., MD;  Location: [facility] END[facility]COPY;  Service: Gastroenterology;  Laterality: N/A;   EUS N/A 02/19/2022   Procedure: UPPER ENDOSCOPIC ULTRASOUND (EUS) RADIAL;  Surgeon: Lemar Lofty., MD;  Location: [facility] ENDOSCOPY;  Service: Gastroenterology;  Laterality: N/A;   FINE NEEDLE ASPIRATION  02/19/2022   Procedure: FINE NEEDLE ASPIRATION (FNA) LINEAR;  Surgeon: Lemar Lofty., MD;  Location: [facility] ENDOSCOPY;  Service: Gastroenterology;;   HYSTEROSCOPY WITH D & C  11/21/2007   @[facility]  by dr Renaldo Fiddler;  polypectomy   INSERTION OF MESH N/A 10/28/2016   Procedure: INSERTION OF MESH;  Surgeon: Berna Bue, MD;  Location: [facility];  Service: General;  Laterality: N/A;   LAPAROSCOPIC SALPINGO OOPHERECTOMY Bilateral 02/02/2022   Procedure: LAPAROSCOPIC BILATERAL SALPINGO OOPHORECTOMY;  Surgeon: Richarda Overlie, MD;  Location: [facility];  Service: Gynecology;  Laterality: Bilateral;   OOPHORECTOMY     PORT-A-CATH  REMOVAL Right 08/24/2022   Procedure: REMOVAL PORT-A-CATH;  Surgeon: Emelia Loron, MD;  Location: [facility];  Service: General;  Laterality: Right;   PORTACATH PLACEMENT Right 04/02/2021   Procedure: INSERTION PORT-A-CATH;  Surgeon: Emelia Loron, MD;  Location: [facility];  Service: General;  Laterality: Right;   TUBAL LIGATION  1999    VENTRAL HERNIA REPAIR N/A 10/28/2016   Procedure: LAPAROSCOPIC VENTRAL HERNIA REPAIR;  Surgeon: Berna Bue, MD;  Location: [facility];  Service: General;  Laterality: N/A;   Patient Active Problem List   Diagnosis Date Noted   Left breast abscess 09/12/2021   Essential hypertension 09/12/2021   Lesion of pancreas 09/12/2021   Hypokalemia 09/12/2021   Genetic testing 04/08/2021   Port-A-Cath in place 04/03/2021   Family history of colon cancer 03/19/2021   Malignant neoplasm of upper-outer quadrant of left breast in female, 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   Suicide ideation 07/07/2013   Major depressive disorder, recurrent severe without psychotic features (HCC) 07/06/2013    REFERRING DIAG: left breast cancer at risk for lymphedema  THERAPY DIAG:  Aftercare following surgery for neoplasm  PERTINENT HISTORY: Patient was diagnosed on 03/05/2021 with left grade III invasive ductal carcinoma breast cancer. She underwent neoadjuvant chemotherapy from 04/03/2021-07/24/2021. She had a left lumpectomy and sentinel node biopsy (2 negative nodes) on 08/25/2021. It is triple positive with a Ki67 of 40%.   PRECAUTIONS: left UE Lymphedema risk, None  SUBJECTIVE: Pt returns for her 3 month L-Dex screen.  "I flew to [place] for my birthday."  PAIN:  Are you having pain? No  SOZO SCREENING:  Patient was assessed today using the SOZO machine to determine the lymphedema index score. This was compared to her baseline score. It was determined that she is NOT within the recommended range when compared to her baseline. She has her compression sleeve from when she was previously found to have subclinical lymphedema. It is recommended she return in 1 month to be reassessed. If she continues to measure outside the recommended range, physical therapy treatment will be recommended at that time and a referral requested.   L-DEX FLOWSHEETS - 05/10/23 0900       L-DEX LYMPHEDEMA SCREENING    Measurement Type Unilateral    L-DEX MEASUREMENT EXTREMITY Upper Extremity    POSITION  Standing    DOMINANT SIDE Right    At Risk Side Left    BASELINE SCORE (UNILATERAL) -2.7    L-DEX SCORE (UNILATERAL) 4.5    VALUE CHANGE (UNILAT) 7.2              Hermenia Bers, PTA 05/10/2023, 9:20 AM

## 2023-05-13 DIAGNOSIS — F4323 Adjustment disorder with mixed anxiety and depressed mood: Secondary | ICD-10-CM | POA: Diagnosis not present

## 2023-05-20 DIAGNOSIS — F4323 Adjustment disorder with mixed anxiety and depressed mood: Secondary | ICD-10-CM | POA: Diagnosis not present

## 2023-05-26 DIAGNOSIS — Z113 Encounter for screening for infections with a predominantly sexual mode of transmission: Secondary | ICD-10-CM | POA: Diagnosis not present

## 2023-05-26 DIAGNOSIS — N760 Acute vaginitis: Secondary | ICD-10-CM | POA: Diagnosis not present

## 2023-05-27 DIAGNOSIS — F4323 Adjustment disorder with mixed anxiety and depressed mood: Secondary | ICD-10-CM | POA: Diagnosis not present

## 2023-06-03 DIAGNOSIS — F4323 Adjustment disorder with mixed anxiety and depressed mood: Secondary | ICD-10-CM | POA: Diagnosis not present

## 2023-06-14 ENCOUNTER — Ambulatory Visit: Payer: Federal, State, Local not specified - PPO | Attending: General Surgery

## 2023-06-14 VITALS — Wt 230.0 lb

## 2023-06-14 DIAGNOSIS — Z483 Aftercare following surgery for neoplasm: Secondary | ICD-10-CM | POA: Insufficient documentation

## 2023-06-14 NOTE — Therapy (Signed)
OUTPATIENT PHYSICAL THERAPY SOZO SCREENING NOTE   Patient Name: Joanna Osborn MRN: [id number] DOB:01-25-1971, 52 y.o., female Today's Date: 06/14/2023  PCP: Milus Height, PA REFERRING PROVIDER: Emelia Loron, MD   PT End of Session - 06/14/23 [phone]     Visit Number 7   # unchanged due to screen only   PT Start Time 0802    PT Stop Time 0807    PT Time Calculation (min) 5 min    Activity Tolerance Patient tolerated treatment well    Behavior During Therapy [facility] for tasks assessed/performed             Past Medical History:  Diagnosis Date   Anemia    Anxiety    Chemotherapy-induced peripheral neuropathy (HCC)    per pt toes   Depression    Family history of colon cancer 03/19/2021   History of chemotherapy    left breast cancer  04-04-2021  to 07-26-2021 and restarted 08-14-2021   History of external beam radiation therapy    left breast cancer  10-02-2021  to 11-18-2021   History of kidney stones    Hypertension    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   Malignant neoplasm of upper-outer quadrant of left breast in female, 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   oncologist--- dr Mosetta Putt;  Stage IB, IDC, ER/ PR/ HER2 +;   chemo 04-04-2021  to 07-26-2021 and started 08-14-2021;  raditation completed 11-18-2021   Port-A-Cath in place 04/02/2021   Wears glasses    Past Surgical History:  Procedure Laterality Date   BIOPSY  02/19/2022   Procedure: BIOPSY;  Surgeon: Meridee Score Netty Starring., MD;  Location: [facility] ENDOSCOPY;  Service: Gastroenterology;;   BREAST LUMPECTOMY WITH RADIOACTIVE SEED AND SENTINEL LYMPH NODE BIOPSY Left 08/25/2021   Procedure: LEFT BREAST LUMPECTOMY WITH RADIOACTIVE SEED AND AXILLARY SENTINEL LYMPH NODE BIOPSY;  Surgeon: Emelia Loron, MD;  Location: [facility];  Service: General;  Laterality: Left;   CARPAL TUNNEL RELEASE Right 08/17/2008   @[facility]  by dr Merlyn Lot;   and excision wrist ganglian cyst   CHOLECYSTECTOMY, LAPAROSCOPIC   06/27/2002   @[facility]  by dr Lurene Shadow   COLONOSCOPY WITH PROPOFOL N/A 03/15/2023   Procedure: COLONOSCOPY WITH PROPOFOL;  Surgeon: Lemar Lofty., MD;  Location: [facility]COPY;  Service: Gastroenterology;  Laterality: N/A;   ESOPHAGOGASTRODUODENOSCOPY (EGD) WITH PROPOFOL N/A 02/19/2022   Procedure: ESOPHAGOGASTRODUODENOSCOPY (EGD) WITH PROPOFOL;  Surgeon: Meridee Score Netty Starring., MD;  Location: [facility] ENDOSCOPY;  Service: Gastroenterology;  Laterality: N/A;   ESOPHAGOGASTRODUODENOSCOPY (EGD) WITH PROPOFOL N/A 07/13/2022   Procedure: ESOPHAGOGASTRODUODENOSCOPY (EGD) WITH PROPOFOL;  Surgeon: Meridee Score Netty Starring., MD;  Location: [facility] ENDOSCOPY;  Service: Gastroenterology;  Laterality: N/A;   EUS N/A 02/19/2022   Procedure: UPPER ENDOSCOPIC ULTRASOUND (EUS) RADIAL;  Surgeon: Lemar Lofty., MD;  Location: [facility] ENDOSCOPY;  Service: Gastroenterology;  Laterality: N/A;   FINE NEEDLE ASPIRATION  02/19/2022   Procedure: FINE NEEDLE ASPIRATION (FNA) LINEAR;  Surgeon: Lemar Lofty., MD;  Location: [facility] ENDOSCOPY;  Service: Gastroenterology;;   HYSTEROSCOPY WITH D & C  11/21/2007   @[facility]  by dr Renaldo Fiddler;  polypectomy   INSERTION OF MESH N/A 10/28/2016   Procedure: INSERTION OF MESH;  Surgeon: Berna Bue, MD;  Location: [facility] OR;  Service: General;  Laterality: N/A;   LAPAROSCOPIC SALPINGO OOPHERECTOMY Bilateral 02/02/2022   Procedure: LAPAROSCOPIC BILATERAL SALPINGO OOPHORECTOMY;  Surgeon: Richarda Overlie, MD;  Location: [facility];  Service: Gynecology;  Laterality: Bilateral;   OOPHORECTOMY  PORT-A-CATH REMOVAL Right 08/24/2022   Procedure: REMOVAL PORT-A-CATH;  Surgeon: Emelia Loron, MD;  Location: [facility];  Service: General;  Laterality: Right;   PORTACATH PLACEMENT Right 04/02/2021   Procedure: INSERTION PORT-A-CATH;  Surgeon: Emelia Loron, MD;  Location: [facility];  Service: General;  Laterality: Right;   TUBAL LIGATION  1999    VENTRAL HERNIA REPAIR N/A 10/28/2016   Procedure: LAPAROSCOPIC VENTRAL HERNIA REPAIR;  Surgeon: Berna Bue, MD;  Location: [facility] OR;  Service: General;  Laterality: N/A;   Patient Active Problem List   Diagnosis Date Noted   Left breast abscess 09/12/2021   Essential hypertension 09/12/2021   Lesion of pancreas 09/12/2021   Hypokalemia 09/12/2021   Genetic testing 04/08/2021   Port-A-Cath in place 04/03/2021   Family history of colon cancer 03/19/2021   Malignant neoplasm of upper-outer quadrant of left breast in female, 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   Suicide ideation 07/07/2013   Major depressive disorder, recurrent severe without psychotic features (HCC) 07/06/2013    REFERRING DIAG: left breast cancer at risk for lymphedema  THERAPY DIAG: Aftercare following surgery for neoplasm  PERTINENT HISTORY: Patient was diagnosed on 03/05/2021 with left grade III invasive ductal carcinoma breast cancer. She underwent neoadjuvant chemotherapy from 04/03/2021-07/24/2021. She had a left lumpectomy and sentinel node biopsy (2 negative nodes) on 08/25/2021. It is triple positive with a Ki67 of 40%.   PRECAUTIONS: left UE Lymphedema risk, None  SUBJECTIVE: Pt returns for her 1 month L-Dex screen reassess from having a high change from baseline.   PAIN:  Are you having pain? No  SOZO SCREENING: Patient was assessed today using the SOZO machine to determine the lymphedema index score. This was compared to her baseline score. It was determined that she is within the recommended range when compared to her baseline and no further action is needed at this time. She will continue SOZO screenings. These are done every 3 months for 2 years post operatively followed by every 6 months for 2 years, and then annually.  Did encourage pt to cont wear of compression sleeve during periods of highly repetitive activities, when increasing her HR (I.e. walking) and/or flying. Pt verbalized good  understanding and was agreeable to this.     L-DEX FLOWSHEETS - 06/14/23 0800       L-DEX LYMPHEDEMA SCREENING   Measurement Type Unilateral    L-DEX MEASUREMENT EXTREMITY Upper Extremity    POSITION  Standing    DOMINANT SIDE Right    At Risk Side Left    BASELINE SCORE (UNILATERAL) -2.7    L-DEX SCORE (UNILATERAL) 0.7    VALUE CHANGE (UNILAT) 3.4            P: Begin 6 month L-Dex screens around Jan 2025.   Hermenia Bers, PTA 06/14/2023, 8:08 AM

## 2023-06-17 DIAGNOSIS — F4323 Adjustment disorder with mixed anxiety and depressed mood: Secondary | ICD-10-CM | POA: Diagnosis not present

## 2023-07-01 ENCOUNTER — Encounter: Payer: Self-pay | Admitting: Hematology

## 2023-07-08 ENCOUNTER — Ambulatory Visit
Admission: RE | Admit: 2023-07-08 | Discharge: 2023-07-08 | Disposition: A | Discharge status: Home / Self Care | Payer: Federal, State, Local not specified - PPO | Source: Ambulatory Visit | Attending: Nurse Practitioner | Admitting: Nurse Practitioner

## 2023-07-08 ENCOUNTER — Ambulatory Visit (INDEPENDENT_AMBULATORY_CARE_PROVIDER_SITE_OTHER): Payer: Federal, State, Local not specified - PPO

## 2023-07-08 ENCOUNTER — Ambulatory Visit (INDEPENDENT_AMBULATORY_CARE_PROVIDER_SITE_OTHER): Payer: Federal, State, Local not specified - PPO | Admitting: Podiatry

## 2023-07-08 DIAGNOSIS — M79672 Pain in left foot: Secondary | ICD-10-CM

## 2023-07-08 DIAGNOSIS — M722 Plantar fascial fibromatosis: Secondary | ICD-10-CM | POA: Diagnosis not present

## 2023-07-08 DIAGNOSIS — M7672 Peroneal tendinitis, left leg: Secondary | ICD-10-CM

## 2023-07-08 DIAGNOSIS — M79671 Pain in right foot: Secondary | ICD-10-CM

## 2023-07-08 DIAGNOSIS — F4323 Adjustment disorder with mixed anxiety and depressed mood: Secondary | ICD-10-CM | POA: Diagnosis not present

## 2023-07-08 DIAGNOSIS — Z853 Personal history of malignant neoplasm of breast: Secondary | ICD-10-CM | POA: Diagnosis not present

## 2023-07-08 DIAGNOSIS — C50412 Malignant neoplasm of upper-outer quadrant of left female breast: Secondary | ICD-10-CM

## 2023-07-08 MED ORDER — MELOXICAM 7.5 MG PO TABS: 15.0000 mg | ORAL_TABLET | Freq: Every day | ORAL | 1 refills | Status: DC

## 2023-07-08 NOTE — Patient Instructions (Signed)
VISIT SUMMARY:  Today, we addressed your ongoing bilateral heel pain, which has been diagnosed as plantar fasciitis and heel spurs. We also discussed the discomfort in your left ankle, likely due to overcompensation from your heel pain. Additionally, we reviewed your occasional heartburn and its management.  YOUR PLAN:  -PLANTAR FASCIITIS: Plantar fasciitis is inflammation of the tissue along the bottom of your foot that connects your heel bone to your toes. We will perform a corticosteroid injection today to help reduce inflammation and start you on Meloxicam 7.5-15mg  daily as needed for pain. You will also begin a home physical therapy program and be referred to physical therapy at [place]. Tuli's heel cups may be used for additional shock absorption. We will follow up in 6 weeks to see how you are doing.  -ANKLE PAIN: Your ankle pain is likely due to overcompensation from your plantar fasciitis, causing mild tenderness over the peroneus tertius and lateral ankle on your left foot. This will be addressed with physical therapy, and we will monitor your progress.  INSTRUCTIONS:  Please follow up in 6 weeks for reevaluation of your heel and ankle pain. Continue taking Meloxicam as needed for pain and start the home physical therapy program. Attend physical therapy sessions at [facility] as referred.   Plantar Fasciitis (Heel Spur Syndrome) with Rehab The plantar fascia is a fibrous, ligament-like, soft-tissue structure that spans the bottom of the foot. Plantar fasciitis is a condition that causes pain in the foot due to inflammation of the tissue. SYMPTOMS  Pain and tenderness on the underneath side of the foot. Pain that worsens with standing or walking. CAUSES  Plantar fasciitis is caused by irritation and injury to the plantar fascia on the underneath side of the foot. Common mechanisms of injury include: Direct trauma to bottom of the foot. Damage to a  small nerve that runs under the foot where the main fascia attaches to the heel bone. Stress placed on the plantar fascia due to bone spurs. RISK INCREASES WITH:  Activities that place stress on the plantar fascia (running, jumping, pivoting, or cutting). Poor strength and flexibility. Improperly fitted shoes. Tight calf muscles. Flat feet. Failure to warm-up properly before activity. Obesity. PREVENTION Warm up and stretch properly before activity. Allow for adequate recovery between workouts. Maintain physical fitness: Strength, flexibility, and endurance. Cardiovascular fitness. Maintain a health body weight. Avoid stress on the plantar fascia. Wear properly fitted shoes, including arch supports for individuals who have flat feet.  PROGNOSIS  If treated properly, then the symptoms of plantar fasciitis usually resolve without surgery. However, occasionally surgery is necessary.  RELATED COMPLICATIONS  Recurrent symptoms that may result in a chronic condition. Problems of the lower back that are caused by compensating for the injury, such as limping. Pain or weakness of the foot during push-off following surgery. Chronic inflammation, scarring, and partial or complete fascia tear, occurring more often from repeated injections.  TREATMENT  Treatment initially involves the use of ice and medication to help reduce pain and inflammation. The use of strengthening and stretching exercises may help reduce pain with activity, especially stretches of the Achilles tendon. These exercises may be performed at home or with a therapist. Your caregiver may recommend that you use heel cups of arch supports to help reduce stress on the plantar fascia. Occasionally, corticosteroid injections are given to reduce inflammation. If symptoms persist for greater than 6 months despite non-surgical (conservative), then surgery may be recommended.   MEDICATION  If pain medication is necessary, then  nonsteroidal anti-inflammatory medications, such as aspirin and ibuprofen, or other minor pain relievers, such as acetaminophen, are often recommended. Do not take pain medication within 7 days before surgery. Prescription pain relievers may be given if deemed necessary by your caregiver. Use only as directed and only as much as you need. Corticosteroid injections may be given by your caregiver. These injections should be reserved for the most serious cases, because they may only be given a certain number of times.  HEAT AND COLD Cold treatment (icing) relieves pain and reduces inflammation. Cold treatment should be applied for 10 to 15 minutes every 2 to 3 hours for inflammation and pain and immediately after any activity that aggravates your symptoms. Use ice packs or massage the area with a piece of ice (ice massage). Heat treatment may be used prior to performing the stretching and strengthening activities prescribed by your caregiver, physical therapist, or athletic trainer. Use a heat pack or soak the injury in warm water.  SEEK IMMEDIATE MEDICAL CARE IF: Treatment seems to offer no benefit, or the condition worsens. Any medications produce adverse side effects.  EXERCISES- RANGE OF MOTION (ROM) AND STRETCHING EXERCISES - Plantar Fasciitis (Heel Spur Syndrome) These exercises may help you when beginning to rehabilitate your injury. Your symptoms may resolve with or without further involvement from your physician, physical therapist or athletic trainer. While completing these exercises, remember:  Restoring tissue flexibility helps normal motion to return to the joints. This allows healthier, less painful movement and activity. An effective stretch should be held for at least 30 seconds. A stretch should never be painful. You should only feel a gentle lengthening or release in the stretched tissue.  RANGE OF MOTION - Toe Extension, Flexion Sit with your right / left leg crossed over your  opposite knee. Grasp your toes and gently pull them back toward the top of your foot. You should feel a stretch on the bottom of your toes and/or foot. Hold this stretch for 10 seconds. Now, gently pull your toes toward the bottom of your foot. You should feel a stretch on the top of your toes and or foot. Hold this stretch for 10 seconds. Repeat  times. Complete this stretch 3 times per day.   RANGE OF MOTION - Ankle Dorsiflexion, Active Assisted Remove shoes and sit on a chair that is preferably not on a carpeted surface. Place right / left foot under knee. Extend your opposite leg for support. Keeping your heel down, slide your right / left foot back toward the chair until you feel a stretch at your ankle or calf. If you do not feel a stretch, slide your bottom forward to the edge of the chair, while still keeping your heel down. Hold this stretch for 10 seconds. Repeat 3 times. Complete this stretch 2 times per day.   STRETCH  Gastroc, Standing Place hands on wall. Extend right / left leg, keeping the front knee somewhat bent. Slightly point your toes inward on your back foot. Keeping your right / left heel on the floor and your knee straight, shift your weight toward the wall, not allowing your back to arch. You should feel a gentle stretch in the right / left calf. Hold this position for 10 seconds. Repeat 3 times. Complete this stretch 2 times per day.  STRETCH  Soleus, Standing Place hands on wall. Extend right / left leg, keeping the other knee somewhat bent. Slightly point your toes inward on  your back foot. Keep your right / left heel on the floor, bend your back knee, and slightly shift your weight over the back leg so that you feel a gentle stretch deep in your back calf. Hold this position for 10 seconds. Repeat 3 times. Complete this stretch 2 times per day.  STRETCH  Gastrocsoleus, Standing  Note: This exercise can place a lot of stress on your foot and ankle. Please  complete this exercise only if specifically instructed by your caregiver.  Place the ball of your right / left foot on a step, keeping your other foot firmly on the same step. Hold on to the wall or a rail for balance. Slowly lift your other foot, allowing your body weight to press your heel down over the edge of the step. You should feel a stretch in your right / left calf. Hold this position for 10 seconds. Repeat this exercise with a slight bend in your right / left knee. Repeat 3 times. Complete this stretch 2 times per day.   STRENGTHENING EXERCISES - Plantar Fasciitis (Heel Spur Syndrome)  These exercises may help you when beginning to rehabilitate your injury. They may resolve your symptoms with or without further involvement from your physician, physical therapist or athletic trainer. While completing these exercises, remember:  Muscles can gain both the endurance and the strength needed for everyday activities through controlled exercises. Complete these exercises as instructed by your physician, physical therapist or athletic trainer. Progress the resistance and repetitions only as guided.  STRENGTH - Towel Curls Sit in a chair positioned on a non-carpeted surface. Place your foot on a towel, keeping your heel on the floor. Pull the towel toward your heel by only curling your toes. Keep your heel on the floor. Repeat 3 times. Complete this exercise 2 times per day.  STRENGTH - Ankle Inversion Secure one end of a rubber exercise band/tubing to a fixed object (table, pole). Loop the other end around your foot just before your toes. Place your fists between your knees. This will focus your strengthening at your ankle. Slowly, pull your big toe up and in, making sure the band/tubing is positioned to resist the entire motion. Hold this position for 10 seconds. Have your muscles resist the band/tubing as it slowly pulls your foot back to the starting position. Repeat 3 times. Complete  this exercises 2 times per day.  Document Released: 07/13/2005 Document Revised: 10/05/2011 Document Reviewed: 10/25/2008 [facility] Patient Information 2014 [place], [place].

## 2023-07-09 NOTE — Progress Notes (Signed)
Subjective:  Patient ID: Joanna Osborn, female    DOB: [birth year],  MRN: [id number]  Chief Complaint  Patient presents with   Plantar Fasciitis    PT PRESENTS WITH BIL HEEL PAIN THAT STARTED 6 MONTHS AGO AND IT HAS GOTTEN WORSE.    Discussed the use of AI scribe software for clinical note transcription with the patient, who gave verbal consent to proceed.  History of Present Illness   The patient, a hair stylist and Buyer, retail, presents with bilateral heel pain, which worsens throughout the day due to prolonged standing. The pain is located at the ball of the right foot and radiates upwards when the foot leans to the side. The patient also reports discomfort when wearing heels, with pain originating from the outer side of the foot. Over time, the patient has noticed a widening of her feet, necessitating a change in shoe size from seven to eight or eight and a half.  The patient has previously sought medical attention for this issue and was provided with shoe inserts. However, these inserts exacerbated the pain and caused the feet to roll outwards. This change in foot position may have contributed to the development of additional pain around the ankle. The patient denies any significant history of ankle injuries.  The patient has been diagnosed with plantar fasciitis and heel spurs, although the latter has been present longer than the pain. The patient has not received any treatment for these conditions apart from the aforementioned shoe inserts. The patient also reports occasional heartburn, managed with intermittent use of omeprazole.          Objective:    Physical Exam   MUSCULOSKELETAL: Pain on palpation at medial and central band insertion on medial heel bilaterally. Mild tenderness over peroneus tertius and lateral ankle on left foot. No significant equinus, good flexibility. No gross physical instability.       No images are attached to the encounter.    Results    Procedure: Corticosteroid injection Description: Corticosteroid injection administered to the medial heel bilaterally at the insertion of the medial and central band of the plantar fascia with 20mg  kenalog and 1 cc each of 2% lidocaine and 0.5% marcaine plain. Informed Consent: Counseling provided on the risks, benefits, and alternatives to corticosteroid injection. Discussed potential for pain relief, risk of infection, and the possibility of needing further treatment.  RADIOLOGY Foot X-rays: No acute fracture or stress fracture. Large plantar and posterior calcaneal spurring. (07/08/2023)      Assessment:   1. Peroneal tendinitis, left   2. Plantar fasciitis of left foot   3. Plantar fasciitis of right foot      Plan:  Patient was evaluated and treated and all questions answered.  Assessment and Plan    Plantar Fasciitis   She exhibits bilateral heel pain, more severe on the right, with pain noted at the insertion of the medial and central band of the plantar fascia on the medial heel. X-rays reveal large plantar and posterior calcaneal spurring. We will perform a corticosteroid injection today and start Meloxicam 7.5-15mg  daily as needed for pain management. A home physical therapy program will be initiated, and she will be referred to physical therapy at [facility]. Tuli's heel cups will be considered for additional shock absorption. A follow-up is scheduled in 6 weeks for reevaluation.  Ankle Pain   She presents with mild tenderness over the peroneus tertius and lateral ankle on the left foot, likely due to overcompensation from plantar  fasciitis. This will be addressed with physical therapy, and we will continue to monitor her progress.          No follow-ups on file.

## 2023-08-05 DIAGNOSIS — F4323 Adjustment disorder with mixed anxiety and depressed mood: Secondary | ICD-10-CM | POA: Diagnosis not present

## 2023-08-11 DIAGNOSIS — Z01419 Encounter for gynecological examination (general) (routine) without abnormal findings: Secondary | ICD-10-CM | POA: Diagnosis not present

## 2023-08-11 DIAGNOSIS — Z124 Encounter for screening for malignant neoplasm of cervix: Secondary | ICD-10-CM | POA: Diagnosis not present

## 2023-08-11 DIAGNOSIS — Z1151 Encounter for screening for human papillomavirus (HPV): Secondary | ICD-10-CM | POA: Diagnosis not present

## 2023-08-19 ENCOUNTER — Encounter: Payer: Self-pay | Admitting: Podiatry

## 2023-08-19 ENCOUNTER — Ambulatory Visit (INDEPENDENT_AMBULATORY_CARE_PROVIDER_SITE_OTHER): Payer: Federal, State, Local not specified - PPO | Admitting: Podiatry

## 2023-08-19 VITALS — Ht 65.0 in | Wt 230.0 lb

## 2023-08-19 DIAGNOSIS — G5761 Lesion of plantar nerve, right lower limb: Secondary | ICD-10-CM

## 2023-08-19 DIAGNOSIS — M722 Plantar fascial fibromatosis: Secondary | ICD-10-CM | POA: Diagnosis not present

## 2023-08-19 DIAGNOSIS — B353 Tinea pedis: Secondary | ICD-10-CM

## 2023-08-19 DIAGNOSIS — M7741 Metatarsalgia, right foot: Secondary | ICD-10-CM | POA: Diagnosis not present

## 2023-08-19 MED ORDER — KETOCONAZOLE 2 % EX CREA: 1.0000 | TOPICAL_CREAM | Freq: Every day | CUTANEOUS | 2 refills | Status: DC

## 2023-08-19 NOTE — Progress Notes (Signed)
  Subjective:  Patient ID: Joanna Osborn, female    DOB: Jan 09, 1971,  MRN: [id number]  Chief Complaint  Patient presents with   Tendonitis    She is doing better today and has no concerns.     Doing quite a bit better the last injection helped, she says her pain is about 80% better than it was she did not go to formal physical therapy as they did not call to schedule her surgery she has been doing the exercises at home.  She also notes a new issue of discomfort in the forefoot between the second and third toes and the ball of the foot and central cracking dry peeling skin on both feet      Objective:    Physical Exam   MUSCULOSKELETAL: Today no pain in the left ankle and the peroneal tendons and no pain in the plantar medial right heel.  She has cracking dry peeling skin in the mid arch in a moccasin distribution and pruritus from this.  There is pain in the interdigital space and sulcus on the right foot between the second and third metatarsal heads with radiation into the toes      No images are attached to the encounter.     RADIOLOGY Foot X-rays: No acute fracture or stress fracture. Large plantar and posterior calcaneal spurring. (07/08/2023)      Assessment:   1. Peroneal tendinitis, left   2. Plantar fasciitis of left foot   3. Plantar fasciitis of right foot      Plan:  Patient was evaluated and treated and all questions answered.  Assessment and Plan    She returns for follow-up her plantar fasciitis is much better about 80% I advised her to continue her home therapy plan until she is at least 2 weeks pain-free  She is a new issue today of a Morton's neuroma that was mildly tender and metatarsalgia I discussed with her offloading and use of a metatarsal pad which I dispensed if this is successful then would recommend custom molded foot orthoses for long-term support and she will let me know if she would like to proceed with these, this likely would be a good  long-term option for treating her plantar fasciitis for recurrence as well  Discussed the etiology and treatment options for tinea pedis.  Discussed topical and oral treatment.  Recommended topical treatment with 2% ketoconazole cream.  This was sent to the patient's pharmacy.  Also discussed appropriate foot hygiene, use of antifungal spray such as Tinactin in shoes, as well as cleaning her foot surfaces such as showers and bathroom floors with bleach.    No follow-ups on file.

## 2023-09-04 ENCOUNTER — Ambulatory Visit
Admission: EM | Admit: 2023-09-04 | Discharge: 2023-09-04 | Disposition: A | Discharge status: Home / Self Care | Payer: Federal, State, Local not specified - PPO | Attending: Internal Medicine | Admitting: Internal Medicine

## 2023-09-04 ENCOUNTER — Other Ambulatory Visit: Payer: Self-pay

## 2023-09-04 ENCOUNTER — Encounter: Payer: Self-pay | Admitting: *Deleted

## 2023-09-04 DIAGNOSIS — K529 Noninfective gastroenteritis and colitis, unspecified: Secondary | ICD-10-CM | POA: Diagnosis not present

## 2023-09-04 DIAGNOSIS — R519 Headache, unspecified: Secondary | ICD-10-CM | POA: Diagnosis not present

## 2023-09-04 DIAGNOSIS — B349 Viral infection, unspecified: Secondary | ICD-10-CM

## 2023-09-04 MED ORDER — ONDANSETRON 4 MG PO TBDP
4.0000 mg | ORAL_TABLET | Freq: Once | ORAL | Status: AC
  Administered 2023-09-04: 4 mg via ORAL

## 2023-09-04 MED ORDER — ONDANSETRON 4 MG PO TBDP: 4.0000 mg | ORAL_TABLET | Freq: Three times a day (TID) | ORAL | 0 refills | Status: DC | PRN

## 2023-09-04 MED ORDER — DEXAMETHASONE SODIUM PHOSPHATE 10 MG/ML IJ SOLN
10.0000 mg | Freq: Once | INTRAMUSCULAR | Status: AC
  Administered 2023-09-04: 10 mg via INTRAMUSCULAR

## 2023-09-04 MED ORDER — PREDNISONE 20 MG PO TABS: 40.0000 mg | ORAL_TABLET | Freq: Every day | ORAL | 0 refills | Status: AC

## 2023-09-04 MED ORDER — KETOROLAC TROMETHAMINE 30 MG/ML IJ SOLN
30.0000 mg | Freq: Once | INTRAMUSCULAR | Status: AC
  Administered 2023-09-04: 30 mg via INTRAMUSCULAR

## 2023-09-04 NOTE — Discharge Instructions (Addendum)
 Symptoms are most consistent with a viral illness.  This does not require antibiotic treatment.  This can also lead to headache along with dehydration.  We will treat with the following: Toradol  injection given today. This is a medication to help with pain. This is not a narcotic.  Decadron  injection given today. This is a steroid to help with inflammation and pain. Prednisone  40 mg daily for 5 days. Take this in the morning.  Start this medication tomorrow, 2/9.  This is a steroid to treat inflammation and pain Zofran  4 mg orally disintegrating tablet every 8 hours as needed for nausea.  First dose given at 9 AM Rest and stay hydrated, make sure to drink plenty of water and/or electrolytes even if you are not able to eat much solid food.  Return to urgent care or PCP if symptoms worsen or fail to resolve.

## 2023-09-04 NOTE — ED Triage Notes (Addendum)
 Since Wednesday night pt has had chills, body aches, headache and diarrhea. Diarrhea worse yesterday "every 2 hours". Also c/o nausea but "I haven't eaten anything". She has taken theraflu but not today.

## 2023-09-04 NOTE — ED Provider Notes (Signed)
 [facility]    CSN: [id number] Arrival date & time: 09/04/23  0816      History   Chief Complaint Chief Complaint  Patient presents with   Chills   Diarrhea    HPI Joanna Osborn is a 53 y.o. female.   53 y.o. female who presents to urgent care with complaints of diarrhea, headaches, chills, body aches and nausea.  This started on Wednesday night around 8 PM.  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  She denies any known sick exposures but is unsure if one of her clients may have been sick.  She has been taking TheraFlu and did take 1 BC powder for her head.  She has not been able to eat or drink much and does feel dehydrated.  The body aches and headache are significant.  She has nausea but no vomiting.  She relates that she really has not eaten much to vomit.  She denies any shortness of breath, abdominal pain, dysuria, hematuria.   Diarrhea Associated symptoms: chills and headaches   Associated symptoms: no abdominal pain, no arthralgias, no fever and no vomiting     Past Medical History:  Diagnosis Date   Anemia    Anxiety    Chemotherapy-induced peripheral neuropathy (HCC)    per pt toes   Depression    Family history of colon cancer 03/19/2021   History of chemotherapy    left breast cancer  04-04-2021  to 07-26-2021 and restarted 08-14-2021   History of external beam radiation therapy    left breast cancer  10-02-2021  to 11-18-2021   History of kidney stones    Hypertension    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   Malignant neoplasm of upper-outer quadrant of left breast in female, 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   oncologist--- dr lanny;  Stage IB, IDC, ER/ PR/ HER2 +;   chemo 04-04-2021  to 07-26-2021 and started 08-14-2021;  raditation completed 11-18-2021   Port-A-Cath in place 04/02/2021   Wears glasses     Patient Active Problem List   Diagnosis Date Noted   Left breast abscess 09/12/2021   Essential  hypertension 09/12/2021   Lesion of pancreas 09/12/2021   Hypokalemia 09/12/2021   Genetic testing 04/08/2021   Port-A-Cath in place 04/03/2021   Family history of colon cancer 03/19/2021   Malignant neoplasm of upper-outer quadrant of left breast in female, 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   Suicide ideation 07/07/2013   Major depressive disorder, recurrent severe without psychotic features (HCC) 07/06/2013    Past Surgical History:  Procedure Laterality Date   BIOPSY  02/19/2022   Procedure: BIOPSY;  Surgeon: Wilhelmenia Aloha Raddle., MD;  Location: [facility] ENDOSCOPY;  Service: Gastroenterology;;   BREAST LUMPECTOMY WITH RADIOACTIVE SEED AND SENTINEL LYMPH NODE BIOPSY Left 08/25/2021   Procedure: LEFT BREAST LUMPECTOMY WITH RADIOACTIVE SEED AND AXILLARY SENTINEL LYMPH NODE BIOPSY;  Surgeon: Ebbie Cough, MD;  Location:  SURGERY CENTER;  Service: General;  Laterality: Left;   CARPAL TUNNEL RELEASE Right 08/17/2008   @[facility]  by dr murrell;   and excision wrist ganglian cyst   CHOLECYSTECTOMY, LAPAROSCOPIC  06/27/2002   @[facility]  by dr adel   COLONOSCOPY WITH PROPOFOL  N/A 03/15/2023   Procedure: COLONOSCOPY WITH PROPOFOL ;  Surgeon: Wilhelmenia Aloha Raddle., MD;  Location: [facility];  Service: Gastroenterology;  Laterality: N/A;   ESOPHAGOGASTRODUODENOSCOPY (EGD) WITH PROPOFOL  N/A 02/19/2022   Procedure: ESOPHAGOGASTRODUODENOSCOPY (EGD) WITH PROPOFOL ;  Surgeon: Wilhelmenia Aloha Raddle., MD;  Location: [facility] ENDOSCOPY;  Service: Gastroenterology;  Laterality: N/A;   ESOPHAGOGASTRODUODENOSCOPY (EGD) WITH PROPOFOL  N/A 07/13/2022   Procedure: ESOPHAGOGASTRODUODENOSCOPY (EGD) WITH PROPOFOL ;  Surgeon: Wilhelmenia Aloha Raddle., MD;  Location: [facility] ENDOSCOPY;  Service: Gastroenterology;  Laterality: N/A;   EUS N/A 02/19/2022   Procedure: UPPER ENDOSCOPIC ULTRASOUND (EUS) RADIAL;  Surgeon: Wilhelmenia Aloha Raddle., MD;  Location: [facility] ENDOSCOPY;  Service: Gastroenterology;  Laterality: N/A;    FINE NEEDLE ASPIRATION  02/19/2022   Procedure: FINE NEEDLE ASPIRATION (FNA) LINEAR;  Surgeon: Wilhelmenia Aloha Raddle., MD;  Location: [facility] ENDOSCOPY;  Service: Gastroenterology;;   HYSTEROSCOPY WITH D & C  11/21/2007   @[facility]  by dr latisha;  polypectomy   INSERTION OF MESH N/A 10/28/2016   Procedure: INSERTION OF MESH;  Surgeon: Mitzie DELENA Freund, MD;  Location: [facility] OR;  Service: General;  Laterality: N/A;   LAPAROSCOPIC SALPINGO OOPHERECTOMY Bilateral 02/02/2022   Procedure: LAPAROSCOPIC BILATERAL SALPINGO OOPHORECTOMY;  Surgeon: Johnnye Ade, MD;  Location: [facility];  Service: Gynecology;  Laterality: Bilateral;   OOPHORECTOMY     PORT-A-CATH REMOVAL Right 08/24/2022   Procedure: REMOVAL PORT-A-CATH;  Surgeon: Ebbie Cough, MD;  Location: [facility];  Service: General;  Laterality: Right;   PORTACATH PLACEMENT Right 04/02/2021   Procedure: INSERTION PORT-A-CATH;  Surgeon: Ebbie Cough, MD;  Location: [facility];  Service: General;  Laterality: Right;   TUBAL LIGATION  1999   VENTRAL HERNIA REPAIR N/A 10/28/2016   Procedure: LAPAROSCOPIC VENTRAL HERNIA REPAIR;  Surgeon: Mitzie DELENA Freund, MD;  Location: [facility] OR;  Service: General;  Laterality: N/A;    OB History     Gravida  2   Para  2   Term  2   Preterm      AB      Living  2      SAB      IAB      Ectopic      Multiple      Live Births               Home Medications    Prior to Admission medications   Medication Sig Start Date End Date Taking? Authorizing Provider  Cholecalciferol (VITAMIN D3 PO) Take 1 tablet by mouth daily.   Yes [provider]  hydrochlorothiazide  (HYDRODIURIL ) 25 MG tablet Take 1 tablet (25 mg total) by mouth daily. Patient taking differently: Take 25 mg by mouth daily. 12/05/21  Yes Lanny Callander, MD  ketoconazole  (NIZORAL ) 2 % cream Apply 1 Application topically daily. 08/19/23  Yes McDonald, Juliene SAUNDERS, DPM  letrozole  (FEMARA )  2.5 MG tablet Take 1 tablet (2.5 mg total) by mouth daily. 04/01/23  Yes Burton, Lacie K, NP  meloxicam  (MOBIC ) 7.5 MG tablet Take 2 tablets (15 mg total) by mouth daily. 07/08/23  Yes McDonald, Juliene SAUNDERS, DPM  omeprazole  (PRILOSEC) 40 MG capsule TAKE 1 CAPSULE (40 MG TOTAL) BY MOUTH 2 (TWO) TIMES DAILY BEFORE A MEAL. 08/19/22  Yes Mansouraty, Aloha Raddle., MD  gabapentin  (NEURONTIN ) 100 MG capsule Take 1 capsule (100 mg total) by mouth at bedtime. 04/01/23   Burton, Lacie K, NP  prochlorperazine  (COMPAZINE ) 10 MG tablet Take 1 tablet (10 mg total) by mouth every 6 (six) hours as needed (Nausea or vomiting). 03/19/21 09/05/21  Lanny Callander, MD    Family History Family History  Problem Relation Age of Onset   Kidney disease Mother    Colon cancer Paternal Aunt    Colon cancer Paternal Grandmother  dx unknown age   Colon cancer Paternal Grandfather        dx after 36   Breast cancer Cousin    Hypertension Other    Cancer Other     Social History Social History   Tobacco Use   Smoking status: Never   Smokeless tobacco: Never  Vaping Use   Vaping status: Never Used  Substance Use Topics   Alcohol use: Not Currently    Comment: one glass wine twice monthly   Drug use: Never     Allergies   Endocet [oxycodone -acetaminophen ], Propoxyphene, and Tape   Review of Systems Review of Systems  Constitutional:  Positive for appetite change and chills. Negative for fever.  HENT:  Negative for ear pain and sore throat.   Eyes:  Negative for pain and visual disturbance.  Respiratory:  Negative for cough and shortness of breath.   Cardiovascular:  Negative for chest pain and palpitations.  Gastrointestinal:  Positive for diarrhea and nausea. Negative for abdominal pain and vomiting.  Genitourinary:  Negative for dysuria and hematuria.  Musculoskeletal:  Negative for arthralgias and back pain.       Generalized body aches  Skin:  Negative for color change and rash.  Neurological:  Positive  for headaches. Negative for seizures and syncope.  All other systems reviewed and are negative.    Physical Exam Triage Vital Signs ED Triage Vitals  Encounter Vitals Group     BP 09/04/23 0836 114/81     Systolic BP Percentile --      Diastolic BP Percentile --      Pulse Rate 09/04/23 0836 (!) 109     Resp 09/04/23 0836 18     Temp 09/04/23 0836 98.8 F (37.1 C)     Temp Source 09/04/23 0836 Oral     SpO2 09/04/23 0836 95 %     Weight --      Height --      Head Circumference --      Peak Flow --      Pain Score 09/04/23 0831 8     Pain Loc --      Pain Education --      Exclude from Growth Chart --    No data found.  Updated Vital Signs BP 114/81 (BP Location: Right Arm)   Pulse (!) 109   Temp 98.8 F (37.1 C) (Oral)   Resp 18   SpO2 95%   Visual Acuity Right Eye Distance:   Left Eye Distance:   Bilateral Distance:    Right Eye Near:   Left Eye Near:    Bilateral Near:     Physical Exam Vitals and nursing note reviewed.  Constitutional:      General: She is not in acute distress.    Appearance: She is well-developed.  HENT:     Head: Normocephalic and atraumatic.     Right Ear: Tympanic membrane normal.     Left Ear: Tympanic membrane normal.     Nose: Nose normal.     Mouth/Throat:     Mouth: Mucous membranes are moist.     Pharynx: No posterior oropharyngeal erythema.  Eyes:     Conjunctiva/sclera: Conjunctivae normal.  Cardiovascular:     Rate and Rhythm: Normal rate and regular rhythm.     Heart sounds: No murmur heard. Pulmonary:     Effort: Pulmonary effort is normal. No respiratory distress.     Breath sounds: Normal breath sounds.  Abdominal:  Palpations: Abdomen is soft.     Tenderness: There is no abdominal tenderness.  Musculoskeletal:        General: No swelling.     Cervical back: Neck supple.  Skin:    General: Skin is warm and dry.     Capillary Refill: Capillary refill takes less than 2 seconds.  Neurological:      General: No focal deficit present.     Mental Status: She is alert and oriented to person, place, and time.     Cranial Nerves: No cranial nerve deficit.  Psychiatric:        Mood and Affect: Mood normal.        Behavior: Behavior normal.        Thought Content: Thought content normal.        Judgment: Judgment normal.      UC Treatments / Results  Labs (all labs ordered are listed, but only abnormal results are displayed) Labs Reviewed - No data to display  EKG   Radiology No results found.  Procedures Procedures (including critical care time)  Medications Ordered in UC Medications - No data to display  Initial Impression / Assessment and Plan / UC Course  I have reviewed the triage vital signs and the nursing notes.  Pertinent labs & imaging results that were available during my care of the patient were reviewed by me and considered in my medical decision making (see chart for details).     Gastroenteritis  Headache due to viral infection   Symptoms are most consistent with a viral illness.  This does not require antibiotic treatment.  This can also lead to headache along with dehydration.  We will treat with the following: Toradol  injection given today. This is a medication to help with pain. This is not a narcotic.  Decadron  injection given today. This is a steroid to help with inflammation and pain. Prednisone  40 mg daily for 5 days. Take this in the morning.  Start this medication tomorrow, 2/9.  This is a steroid to treat inflammation and pain Zofran  4 mg orally disintegrating tablet every 8 hours as needed for nausea.  First dose given at 9 AM Rest and stay hydrated, make sure to drink plenty of water and/or electrolytes even if you are not able to eat much solid food.  Return to urgent care or PCP if symptoms worsen or fail to resolve.    Final Clinical Impressions(s) / UC Diagnoses   Final diagnoses:  None   Discharge Instructions   None    ED  Prescriptions   None    PDMP not reviewed this encounter.   Teresa Almarie LABOR, PA-C 09/04/23 0924

## 2023-09-09 DIAGNOSIS — F4323 Adjustment disorder with mixed anxiety and depressed mood: Secondary | ICD-10-CM | POA: Diagnosis not present

## 2023-09-16 NOTE — Therapy (Incomplete)
OUTPATIENT PHYSICAL THERAPY SOZO SCREENING NOTE   Patient Name: Joanna Osborn MRN: [id number] DOB:1971-07-06, 53 y.o., female Today's Date: 09/16/2023  PCP: Milus Height, PA REFERRING PROVIDER: Emelia Loron, MD     Past Medical History:  Diagnosis Date   Anemia    Anxiety    Chemotherapy-induced peripheral neuropathy (HCC)    per pt toes   Depression    Family history of colon cancer 03/19/2021   History of chemotherapy    left breast cancer  04-04-2021  to 07-26-2021 and restarted 08-14-2021   History of external beam radiation therapy    left breast cancer  10-02-2021  to 11-18-2021   History of kidney stones    Hypertension    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   Malignant neoplasm of upper-outer quadrant of left breast in female, 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   oncologist--- dr Mosetta Putt;  Stage IB, IDC, ER/ PR/ HER2 +;   chemo 04-04-2021  to 07-26-2021 and started 08-14-2021;  raditation completed 11-18-2021   Port-A-Cath in place 04/02/2021   Wears glasses    Past Surgical History:  Procedure Laterality Date   BIOPSY  02/19/2022   Procedure: BIOPSY;  Surgeon: Lemar Lofty., MD;  Location: [facility] ENDOSCOPY;  Service: Gastroenterology;;   BREAST LUMPECTOMY WITH RADIOACTIVE SEED AND SENTINEL LYMPH NODE BIOPSY Left 08/25/2021   Procedure: LEFT BREAST LUMPECTOMY WITH RADIOACTIVE SEED AND AXILLARY SENTINEL LYMPH NODE BIOPSY;  Surgeon: Emelia Loron, MD;  Location: [facility];  Service: General;  Laterality: Left;   CARPAL TUNNEL RELEASE Right 08/17/2008   @[facility]  by dr Merlyn Lot;   and excision wrist ganglian cyst   CHOLECYSTECTOMY, LAPAROSCOPIC  06/27/2002   @[facility]  by dr Lurene Shadow   COLONOSCOPY WITH PROPOFOL N/A 03/15/2023   Procedure: COLONOSCOPY WITH PROPOFOL;  Surgeon: Lemar Lofty., MD;  Location: [facility];  Service: Gastroenterology;  Laterality: N/A;   ESOPHAGOGASTRODUODENOSCOPY (EGD) WITH PROPOFOL N/A 02/19/2022   Procedure:  ESOPHAGOGASTRODUODENOSCOPY (EGD) WITH PROPOFOL;  Surgeon: Meridee Score Netty Starring., MD;  Location: [facility] ENDOSCOPY;  Service: Gastroenterology;  Laterality: N/A;   ESOPHAGOGASTRODUODENOSCOPY (EGD) WITH PROPOFOL N/A 07/13/2022   Procedure: ESOPHAGOGASTRODUODENOSCOPY (EGD) WITH PROPOFOL;  Surgeon: Meridee Score Netty Starring., MD;  Location: [facility]COPY;  Service: Gastroenterology;  Laterality: N/A;   EUS N/A 02/19/2022   Procedure: UPPER ENDOSCOPIC ULTRASOUND (EUS) RADIAL;  Surgeon: Lemar Lofty., MD;  Location: [facility] ENDOSCOPY;  Service: Gastroenterology;  Laterality: N/A;   FINE NEEDLE ASPIRATION  02/19/2022   Procedure: FINE NEEDLE ASPIRATION (FNA) LINEAR;  Surgeon: Lemar Lofty., MD;  Location: [facility] ENDOSCOPY;  Service: Gastroenterology;;   HYSTEROSCOPY WITH D & C  11/21/2007   @[facility]  by dr Renaldo Fiddler;  polypectomy   INSERTION OF MESH N/A 10/28/2016   Procedure: INSERTION OF MESH;  Surgeon: Berna Bue, MD;  Location: [facility] OR;  Service: General;  Laterality: N/A;   LAPAROSCOPIC SALPINGO OOPHERECTOMY Bilateral 02/02/2022   Procedure: LAPAROSCOPIC BILATERAL SALPINGO OOPHORECTOMY;  Surgeon: Richarda Overlie, MD;  Location: [facility];  Service: Gynecology;  Laterality: Bilateral;   OOPHORECTOMY     PORT-A-CATH REMOVAL Right 08/24/2022   Procedure: REMOVAL PORT-A-CATH;  Surgeon: Emelia Loron, MD;  Location: [facility];  Service: General;  Laterality: Right;   PORTACATH PLACEMENT Right 04/02/2021   Procedure: INSERTION PORT-A-CATH;  Surgeon: Emelia Loron, MD;  Location: [facility];  Service: General;  Laterality: Right;   TUBAL LIGATION  1999   VENTRAL HERNIA REPAIR N/A 10/28/2016   Procedure: LAPAROSCOPIC VENTRAL HERNIA  REPAIR;  Surgeon: Berna Bue, MD;  Location: [facility] OR;  Service: General;  Laterality: N/A;   Patient Active Problem List   Diagnosis Date Noted   Left breast abscess 09/12/2021   Essential hypertension 09/12/2021    Lesion of pancreas 09/12/2021   Hypokalemia 09/12/2021   Genetic testing 04/08/2021   Port-A-Cath in place 04/03/2021   Family history of colon cancer 03/19/2021   Malignant neoplasm of upper-outer quadrant of left breast in female, 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   Suicide ideation 07/07/2013   Major depressive disorder, recurrent severe without psychotic features (HCC) 07/06/2013    REFERRING DIAG: left breast cancer at risk for lymphedema  THERAPY DIAG: No diagnosis found.  PERTINENT HISTORY: Patient was diagnosed on 03/05/2021 with left grade III invasive ductal carcinoma breast cancer. She underwent neoadjuvant chemotherapy from 04/03/2021-07/24/2021. She had a left lumpectomy and sentinel node biopsy (2 negative nodes) on 08/25/2021. It is triple positive with a Ki67 of 40%.   PRECAUTIONS: left UE Lymphedema risk, None  SUBJECTIVE: Pt returns for her 1 month L-Dex screen reassess from having a high change from baseline.   PAIN:  Are you having pain? No  SOZO SCREENING: Patient was assessed today using the SOZO machine to determine the lymphedema index score. This was compared to her baseline score. It was determined that she is within the recommended range when compared to her baseline and no further action is needed at this time. She will continue SOZO screenings. These are done every 3 months for 2 years post operatively followed by every 6 months for 2 years, and then annually.  Did encourage pt to cont wear of compression sleeve during periods of highly repetitive activities, when increasing her HR (I.e. walking) and/or flying. Pt verbalized good understanding and was agreeable to this.      P: Begin 6 month L-Dex screens around Jan 2025.   [address] [place], [place] 09/16/2023, 3:34 PM

## 2023-09-20 ENCOUNTER — Ambulatory Visit: Payer: Federal, State, Local not specified - PPO | Attending: General Surgery | Admitting: Physical Therapy

## 2023-09-20 DIAGNOSIS — Z483 Aftercare following surgery for neoplasm: Secondary | ICD-10-CM | POA: Insufficient documentation

## 2023-09-23 DIAGNOSIS — F4323 Adjustment disorder with mixed anxiety and depressed mood: Secondary | ICD-10-CM | POA: Diagnosis not present

## 2023-09-28 NOTE — Assessment & Plan Note (Signed)
-  noted on CT AP on 09/12/21 for work up of diarrhea and abdominal pain. -abdomen MRI on 11/25/21 showed a 2 cm complex cystic mass, suggesting cystic neoplasm.  -EGD/EUS on 02/19/22 with Dr. Meridee Score, cytology from pancreatic head lesion was negative. 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 Given stable size, repeating EUS biopsy is probably not needed.

## 2023-09-28 NOTE — Assessment & Plan Note (Signed)
Stage IB, 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  S/p neoadjuvant TCHP, left lumpectomy, adjuvant radiation, and a year of Herceptin/Perjeta -She began adjuvant Zoladex until BSO and letrozole in 11/2021  -Joanna Osborn is clinically doing well.  Tolerating letrozole with moderate hot flashes, joint stiffness, hair thinning and brittle nails. I recommend skin/hair/nail vitamin or biotin.  Side effects are manageable.

## 2023-09-29 ENCOUNTER — Other Ambulatory Visit: Payer: Self-pay

## 2023-09-29 DIAGNOSIS — K869 Disease of pancreas, unspecified: Secondary | ICD-10-CM

## 2023-09-29 DIAGNOSIS — C50412 Malignant neoplasm of upper-outer quadrant of left female breast: Secondary | ICD-10-CM

## 2023-09-30 ENCOUNTER — Encounter: Payer: Self-pay | Admitting: Hematology

## 2023-09-30 ENCOUNTER — Inpatient Hospital Stay (HOSPITAL_BASED_OUTPATIENT_CLINIC_OR_DEPARTMENT_OTHER): Payer: Federal, State, Local not specified - PPO | Admitting: Hematology

## 2023-09-30 ENCOUNTER — Inpatient Hospital Stay: Payer: Federal, State, Local not specified - PPO | Attending: Hematology

## 2023-09-30 VITALS — BP 120/84 | HR 99 | Temp 97.7°F | Resp 17 | Wt 231.9 lb

## 2023-09-30 DIAGNOSIS — K869 Disease of pancreas, unspecified: Secondary | ICD-10-CM

## 2023-09-30 DIAGNOSIS — Z1721 Progesterone receptor positive status: Secondary | ICD-10-CM | POA: Insufficient documentation

## 2023-09-30 DIAGNOSIS — Z170 Estrogen receptor positive status [ER+]: Secondary | ICD-10-CM | POA: Diagnosis not present

## 2023-09-30 DIAGNOSIS — E2839 Other primary ovarian failure: Secondary | ICD-10-CM | POA: Diagnosis not present

## 2023-09-30 DIAGNOSIS — C50412 Malignant neoplasm of upper-outer quadrant of left female breast: Secondary | ICD-10-CM

## 2023-09-30 DIAGNOSIS — R252 Cramp and spasm: Secondary | ICD-10-CM | POA: Insufficient documentation

## 2023-09-30 DIAGNOSIS — Z79899 Other long term (current) drug therapy: Secondary | ICD-10-CM | POA: Insufficient documentation

## 2023-09-30 DIAGNOSIS — Z923 Personal history of irradiation: Secondary | ICD-10-CM | POA: Diagnosis not present

## 2023-09-30 DIAGNOSIS — E559 Vitamin D deficiency, unspecified: Secondary | ICD-10-CM | POA: Diagnosis not present

## 2023-09-30 DIAGNOSIS — R739 Hyperglycemia, unspecified: Secondary | ICD-10-CM | POA: Diagnosis not present

## 2023-09-30 DIAGNOSIS — Z23 Encounter for immunization: Secondary | ICD-10-CM | POA: Diagnosis not present

## 2023-09-30 DIAGNOSIS — Z79811 Long term (current) use of aromatase inhibitors: Secondary | ICD-10-CM | POA: Diagnosis not present

## 2023-09-30 DIAGNOSIS — Z9221 Personal history of antineoplastic chemotherapy: Secondary | ICD-10-CM | POA: Insufficient documentation

## 2023-09-30 DIAGNOSIS — Z1731 Human epidermal growth factor receptor 2 positive status: Secondary | ICD-10-CM | POA: Insufficient documentation

## 2023-09-30 DIAGNOSIS — F419 Anxiety disorder, unspecified: Secondary | ICD-10-CM | POA: Diagnosis not present

## 2023-09-30 DIAGNOSIS — K862 Cyst of pancreas: Secondary | ICD-10-CM | POA: Diagnosis not present

## 2023-09-30 DIAGNOSIS — K219 Gastro-esophageal reflux disease without esophagitis: Secondary | ICD-10-CM | POA: Diagnosis not present

## 2023-09-30 DIAGNOSIS — Z0000 Encounter for general adult medical examination without abnormal findings: Secondary | ICD-10-CM | POA: Diagnosis not present

## 2023-09-30 DIAGNOSIS — I10 Essential (primary) hypertension: Secondary | ICD-10-CM | POA: Diagnosis not present

## 2023-09-30 LAB — CBC WITH DIFFERENTIAL (CANCER CENTER ONLY)
Abs Immature Granulocytes: 0.02 10*3/uL (ref 0.00–0.07)
Basophils Absolute: 0 10*3/uL (ref 0.0–0.1)
Basophils Relative: 1 %
Eosinophils Absolute: 0.2 10*3/uL (ref 0.0–0.5)
Eosinophils Relative: 3 %
HCT: 38.5 % (ref 36.0–46.0)
Hemoglobin: 12.6 g/dL (ref 12.0–15.0)
Immature Granulocytes: 0 %
Lymphocytes Relative: 41 %
Lymphs Abs: 2.5 10*3/uL (ref 0.7–4.0)
MCH: 29 pg (ref 26.0–34.0)
MCHC: 32.7 g/dL (ref 30.0–36.0)
MCV: 88.5 fL (ref 80.0–100.0)
Monocytes Absolute: 0.4 10*3/uL (ref 0.1–1.0)
Monocytes Relative: 7 %
Neutro Abs: 3 10*3/uL (ref 1.7–7.7)
Neutrophils Relative %: 48 %
Platelet Count: 395 10*3/uL (ref 150–400)
RBC: 4.35 MIL/uL (ref 3.87–5.11)
RDW: 14 % (ref 11.5–15.5)
WBC Count: 6.1 10*3/uL (ref 4.0–10.5)
nRBC: 0 % (ref 0.0–0.2)

## 2023-09-30 LAB — CMP (CANCER CENTER ONLY)
ALT: 20 U/L (ref 0–44)
AST: 16 U/L (ref 15–41)
Albumin: 4.3 g/dL (ref 3.5–5.0)
Alkaline Phosphatase: 71 U/L (ref 38–126)
Anion gap: 10 (ref 5–15)
BUN: 23 mg/dL — ABNORMAL HIGH (ref 6–20)
CO2: 25 mmol/L (ref 22–32)
Calcium: 9.5 mg/dL (ref 8.9–10.3)
Chloride: 106 mmol/L (ref 98–111)
Creatinine: 0.85 mg/dL (ref 0.44–1.00)
GFR, Estimated: >60 mL/min (ref >60)
Glucose, Bld: 133 mg/dL — ABNORMAL HIGH (ref 70–99)
Potassium: 3.8 mmol/L (ref 3.5–5.1)
Sodium: 141 mmol/L (ref 135–145)
Total Bilirubin: 0.6 mg/dL (ref 0.0–1.2)
Total Protein: 7.1 g/dL (ref 6.5–8.1)

## 2023-09-30 NOTE — Progress Notes (Signed)
 [facility]   Telephone:([phone] Fax:([phone]) [phone]   Clinic Follow up Note   Patient Care Team: Milus Height, PA as PCP - General (Physician Assistant) Donnelly Angelica, RN as Oncology Nurse Navigator Pershing Proud, RN as Oncology Nurse Navigator Emelia Loron, MD as Consulting Physician (General Surgery) Malachy Mood, MD as Consulting Physician (Hematology) Dorothy Puffer, MD as Consulting Physician (Radiation Oncology) Malachy Mood, MD as Consulting Physician (Hematology) Imaging, [facility] as Consulting Physician (Diagnostic Radiology)  Date of Service:  09/30/2023  CHIEF COMPLAINT: f/u of breast cancer   CURRENT THERAPY:  Adjuvant letrozole  Oncology History   Malignant neoplasm of upper-outer quadrant of left breast in female, estrogen receptor positive (HCC) Stage IB, 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  S/p neoadjuvant TCHP, left lumpectomy, adjuvant radiation, and a year of Herceptin/Perjeta -She began adjuvant Zoladex until BSO and letrozole in 11/2021  -Ms. Spragg is clinically doing well.  Tolerating letrozole with moderate hot flashes, joint stiffness, hair thinning and brittle nails. I recommend skin/hair/nail vitamin or biotin.  Side effects are manageable.    Lesion of pancreas -noted on CT AP on 09/12/21 for work up of diarrhea and abdominal pain. -abdomen MRI on 11/25/21 showed a 2 cm complex cystic mass, suggesting cystic neoplasm.  -EGD/EUS on 02/19/22 with Dr. Meridee Score, cytology from pancreatic head lesion was negative. 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 Given stable size, repeating EUS biopsy is probably not  needed.    Assessment and Plan    Breast Cancer Post-Treatment 53 year old female with mild intermittent discomfort and tenderness in the breast area, likely due to nerve damage from surgery and lymph node removal. No significant swelling or new lumps detected. Using a machine for relief and performing stretching exercises. 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 Last MRI in May 2024, biopsy in July 2023, and follow-up EGD in December 2023. Frequency of future MRIs needs clarification. - Contact specialist to determine frequency of future MRIs - Consider scheduling next MRI this year or next year  Muscle Cramps Severe cramps in legs, back, and feet, particularly at night. Currently taking vitamin D but not calcium. Cramps may be related to dehydration or electrolyte imbalances. Discussed importance of hydration and electrolyte balance. - Order bone density scan - Recommend increasing hydration with electrolyte-rich fluids - Advise taking over-the-counter calcium and magnesium supplements - Recommend drinking tonic water  Weight Management Concern about weight loss and hesitant to use weight loss medications due to fatty liver. Not previously referred to a weight management clinic. Discussed referral to a clinic with a multidisciplinary team for metabolic evaluation and potential medication use. - Refer to weight management clinic - Provide contact information for weight management clinic  General Health Maintenance Postmenopausal and due for a bone density scan. Mammogram due in three months. Taking vitamin D but not calcium. Discussed importance of bone health. - Order bone density scan - Order mammogram in three months - Advise taking calcium supplements  Plan -She is  clinically doing well, exam unremarkable, no  concern for breast cancer recurrence. -Continue letrozole - Schedule next oncology visit in six months.  -Bone density scan in the next few months -I will reach out to Dr. Meridee Score for her follow-up        SUMMARY OF ONCOLOGIC HISTORY: Oncology History Overview Note   Cancer Staging  Malignant neoplasm of upper-outer quadrant of left breast in female, estrogen receptor positive (HCC) Staging form: Breast, AJCC 8th Edition - Clinical stage from 03/07/2021: Stage IB (cT2, cN0, cM0, G3, ER+, PR+, HER2+) - Signed by Malachy Mood, MD on 03/17/2021 Stage prefix: Initial diagnosis Histologic grading system: 3 grade system - Pathologic stage from 08/25/2021: No Stage Recommended (ypT0, pN0, cM0, G3, ER+, PR+, HER2+) - Signed by Malachy Mood, MD on 09/05/2021 Stage prefix: Post-therapy Response to neoadjuvant therapy: Complete response Nuclear grade: G3 Histologic grading system: 3 grade system     Malignant neoplasm of upper-outer quadrant of left breast in female, estrogen receptor positive (HCC)  03/05/2021 Mammogram   Diagnostic Left Mammogram; Left Breast Ultrasound  IMPRESSION: At the palpable site of concern in the left breast at 12 o'clock there is a suspicious mass measuring 2.1 cm.   03/07/2021 Cancer Staging   Staging form: Breast, AJCC 8th Edition - Clinical stage from 03/07/2021: Stage IB (cT2, cN0, cM0, G3, ER+, PR+, HER2+) - Signed by Malachy Mood, MD on 03/17/2021 Stage prefix: Initial diagnosis Histologic grading system: 3 grade system   03/07/2021 Pathology Results   Diagnosis Breast, left, needle core biopsy, left breast 12 o' clock - INVASIVE DUCTAL CARCINOMA - SEE COMMENT Based on the biopsy, the carcinoma appears Nottingham grade 3 of 3  PROGNOSTIC INDICATORS Results: IMMUNOHISTOCHEMICAL AND MORPHOMETRIC ANALYSIS PERFORMED MANUALLY The tumor cells are POSITIVE for Her2 (3+). Estrogen Receptor: 30%, POSITIVE, WEAK STAINING  INTENSITY Progesterone Receptor: 15%, POSITIVE, MODERATE STAINING INTENSITY Proliferation Marker Ki67: 40%     03/13/2021 Initial Diagnosis   Malignant neoplasm of upper-outer quadrant of left breast in female, estrogen receptor positive (HCC)   03/26/2021 Imaging   MRI Breast  IMPRESSION: 1. 2.5 cm biopsy-proven malignancy within the UPPER LEFT breast. 2. Abnormal lymph node with focal cortical thickening in the posterior UPPER-OUTER LEFT breast/LOWER LEFT axilla. 2nd-look ultrasound with possible biopsy is recommended. 3. Indeterminate 0.8 cm OUTER RIGHT breast mass. 2nd-look ultrasound with possible biopsy is recommended.   03/29/2021 Genetic Testing   Negative hereditary cancer genetic testing: no pathogenic variants detected in [facility] CustomNext-Cancer +RNAinsight Panel.  The report date is March 29, 2021.   The CustomNext-Cancer+RNAinsight panel offered by [employer] [facility] includes sequencing and rearrangement analysis for the following 47 genes:  APC, ATM, AXIN2, BARD1, BMPR1A, BRCA1, BRCA2, BRIP1, CDH1, CDK4, CDKN2A, CHEK2, DICER1, EPCAM, GREM1, HOXB13, MEN1, MLH1, MSH2, MSH3, MSH6, MUTYH, NBN, NF1, NF2, NTHL1, PALB2, PMS2, POLD1, POLE, PTEN, RAD51C, RAD51D, RECQL, RET, SDHA, SDHAF2, SDHB, SDHC, SDHD, SMAD4, SMARCA4, STK11, TP53, TSC1, TSC2, and VHL.  RNA data is routinely analyzed for use in variant interpretation for all genes.   04/04/2021 - 07/26/2021 Chemotherapy   Patient is on Treatment Plan : BREAST  Docetaxel + Carboplatin + Trastuzumab + Pertuzumab  (TCHP) q21d      04/14/2021 Imaging   [place] Bilateral Breast  IMPRESSION: 1. No sonographic correlate for the 8 mm enhancing mass in the outer right breast. Recommendation is to prior seed with MRI guided biopsy. 2. An undulating, low lying lymph node along the 1 o'clock axis of the left breast demonstrates no areas of cortical thickening beyond 3 mm.  08/14/2021 - 03/12/2022 Chemotherapy   Patient is on Treatment Plan : BREAST  Trastuzumab + Pertuzumab q21d     08/25/2021 Cancer Staging   Staging form: Breast, AJCC 8th Edition - Pathologic stage from 08/25/2021: No Stage Recommended (ypT0, pN0, cM0, G3, ER+, PR+, HER2+) - Signed by Malachy Mood, MD on 09/05/2021 Stage prefix: Post-therapy Response to neoadjuvant therapy: Complete response Nuclear grade: G3 Histologic grading system: 3 grade system   08/25/2021 Definitive Surgery   FINAL MICROSCOPIC DIAGNOSIS:   A. BREAST, LEFT, LUMPECTOMY:  - No evidence of residual carcinoma - complete therapeutic response  - Therapy related changes, including hyalin fibrosis  - Fibrocystic change with urinary  - See comment   B. BREAST, LEFT ADDITIONAL MEDIAL MARGIN, EXCISION:  - Fibrocystic change  - Negative for carcinoma   C. BREAST, LEFT ADDITIONAL SUPERIOR MARGIN, EXCISION:  - Fibrocystic change  - Negative for carcinoma   D. BREAST, LEFT ADDITIONAL POSTERIOR MARGIN, EXCISION:  - Benign breast parenchyma  - Negative for carcinoma   E. BREAST, LEFT ADDITIONAL ANTERIOR MARGIN, EXCISION:  - Benign breast parenchyma  - Negative for carcinoma   F. LYMPH NODE, LEFT AXILLARY, SENTINEL, EXCISION:  - Lymph node, negative for carcinoma (0/1)   G. LYMPH NODE, LEFT AXILLARY, SENTINEL, EXCISION:  - Lymph node, negative for carcinoma (0/1)    COMMENT:  The appropriate pathologic stage (AJCC eighth edition) is ypT0 ypN0.       Discussed the use of AI scribe software for clinical note transcription with the patient, who gave verbal consent to proceed.  History of Present Illness   The patient, a 53 year old female with a history of breast cancer, presents for a routine follow-up. She reports persistent breast pain and tenderness, particularly in the area where a lymph node was removed. The pain varies in intensity and is sometimes quite severe. She manages the discomfort with a machine provided to her and by stretching. She denies any significant swelling in the  area.  In addition to the breast pain, the patient has been experiencing severe cramps in various parts of her body, including her back, legs, and feet. The cramps often occur at night during sleep or when she stretches or yawns. She has been taking over-the-counter magnesium and vitamin D, but not calcium.  The patient also mentions a history of nerve damage in her back, for which she is taking gabapentin. She reports that the medication has been helpful for her back pain, but not for her hot flashes. She expresses concern about her weight and is interested in exploring weight loss options.         All other systems were reviewed with the patient and are negative.  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   Depression    Family history of colon cancer 03/19/2021   History of chemotherapy    left breast cancer  04-04-2021  to 07-26-2021 and restarted 08-14-2021   History of external beam radiation therapy    left breast cancer  10-02-2021  to 11-18-2021   History of kidney stones    Hypertension    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   Malignant neoplasm of upper-outer quadrant of left breast in female, 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   oncologist--- dr Mosetta Putt;  Stage IB, IDC, ER/ PR/ HER2 +;   chemo 04-04-2021  to 07-26-2021 and started 08-14-2021;  raditation completed 11-18-2021   Port-A-Cath in place 04/02/2021   Wears glasses     SURGICAL HISTORY: Past Surgical History:  Procedure Laterality Date   BIOPSY  02/19/2022   Procedure: BIOPSY;  Surgeon: Mansouraty, Netty Starring., MD;  Location: [facility];  Service: Gastroenterology;;   BREAST LUMPECTOMY WITH RADIOACTIVE SEED AND SENTINEL LYMPH NODE BIOPSY Left 08/25/2021   Procedure: LEFT BREAST LUMPECTOMY WITH RADIOACTIVE SEED AND AXILLARY SENTINEL LYMPH NODE BIOPSY;  Surgeon: Emelia Loron, MD;  Location: [facility];  Service: General;  Laterality: Left;   CARPAL TUNNEL RELEASE Right 08/17/2008   @[facility]  by dr Merlyn Lot;   and excision wrist ganglian cyst   CHOLECYSTECTOMY, LAPAROSCOPIC  06/27/2002   @[facility]  by dr Lurene Shadow   COLONOSCOPY WITH PROPOFOL N/A 03/15/2023   Procedure: COLONOSCOPY WITH PROPOFOL;  Surgeon: Lemar Lofty., MD;  Location: [facility];  Service: Gastroenterology;  Laterality: N/A;   ESOPHAGOGASTRODUODENOSCOPY (EGD) WITH PROPOFOL N/A 02/19/2022   Procedure: ESOPHAGOGASTRODUODENOSCOPY (EGD) WITH PROPOFOL;  Surgeon: Meridee Score Netty Starring., MD;  Location: [facility];  Service: Gastroenterology;  Laterality: N/A;   ESOPHAGOGASTRODUODENOSCOPY (EGD) WITH PROPOFOL N/A 07/13/2022   Procedure: ESOPHAGOGASTRODUODENOSCOPY (EGD) WITH PROPOFOL;  Surgeon: Meridee Score Netty Starring., MD;  Location: [facility];  Service: Gastroenterology;  Laterality: N/A;   EUS N/A 02/19/2022   Procedure: UPPER ENDOSCOPIC ULTRASOUND (EUS) RADIAL;  Surgeon: Lemar Lofty., MD;  Location: [facility];  Service: Gastroenterology;  Laterality: N/A;   FINE NEEDLE ASPIRATION  02/19/2022   Procedure: FINE NEEDLE ASPIRATION (FNA) LINEAR;  Surgeon: Lemar Lofty., MD;  Location: [facility] END[facility]COPY;  Service: Gastroenterology;;   HYSTEROSCOPY WITH D & C  11/21/2007   @[facility]  by dr Renaldo Fiddler;  polypectomy   INSERTION OF MESH N/A 10/28/2016   Procedure: INSERTION OF MESH;  Surgeon: Berna Bue, MD;  Location: [facility];  Service: General;  Laterality: N/A;   LAPAROSCOPIC SALPINGO OOPHERECTOMY Bilateral 02/02/2022   Procedure: LAPAROSCOPIC BILATERAL SALPINGO OOPHORECTOMY;  Surgeon: Richarda Overlie, MD;  Location: [facility];  Service: Gynecology;  Laterality: Bilateral;   OOPHORECTOMY     PORT-A-CATH REMOVAL Right 08/24/2022   Procedure: REMOVAL PORT-A-CATH;  Surgeon: Emelia Loron, MD;  Location: [facility];  Service: General;  Laterality: Right;   PORTACATH PLACEMENT  Right 04/02/2021   Procedure: INSERTION PORT-A-CATH;  Surgeon: Emelia Loron, MD;  Location: [facility];  Service: General;  Laterality: Right;   TUBAL LIGATION  1999   VENTRAL HERNIA REPAIR N/A 10/28/2016   Procedure: LAPAROSCOPIC VENTRAL HERNIA REPAIR;  Surgeon: Berna Bue, MD;  Location: [facility];  Service: General;  Laterality: N/A;    I have reviewed the social history and family history with the patient and they are unchanged from previous note.  ALLERGIES:  is allergic to endocet [oxycodone-acetaminophen], propoxyphene, and tape.  MEDICATIONS:  Current Outpatient Medications  Medication Sig Dispense Refill   Cholecalciferol (VITAMIN D3 PO) Take 1 tablet by mouth daily.     gabapentin (NEURONTIN) 100 MG capsule Take 1 capsule (100 mg total) by mouth at bedtime. 30 capsule 3   hydrochlorothiazide (HYDRODIURIL) 25 MG tablet Take 1 tablet (25 mg total) by mouth daily. (Patient taking differently: Take 25 mg by mouth daily.) 30 tablet 0   ketoconazole (NIZORAL) 2 % cream Apply 1 Application topically daily. 60 g 2   letrozole (FEMARA) 2.5 MG tablet Take 1 tablet (2.5 mg total) by mouth daily. 90 tablet 3   meloxicam (MOBIC) 7.5 MG tablet Take 2 tablets (  15 mg total) by mouth daily. 60 tablet 1   omeprazole (PRILOSEC) 40 MG capsule TAKE 1 CAPSULE (40 MG TOTAL) BY MOUTH 2 (TWO) TIMES DAILY BEFORE A MEAL. 180 capsule 0   ondansetron (ZOFRAN-ODT) 4 MG disintegrating tablet Take 1 tablet (4 mg total) by mouth every 8 (eight) hours as needed for nausea or vomiting. 20 tablet 0   No current facility-administered medications for this visit.    PHYSICAL EXAMINATION: ECOG PERFORMANCE STATUS: 0 - Asymptomatic  Vitals:   09/30/23 0804  BP: 120/84  Pulse: 99  Resp: 17  Temp: 97.7 F (36.5 C)  SpO2: 99%   Wt Readings from Last 3 Encounters:  09/30/23 231 lb 14.4 oz (105.2 kg)  08/19/23 230 lb (104.3 kg)  06/14/23 230 lb (104.3 kg)     GENERAL:alert, no distress  and comfortable SKIN: skin color, texture, turgor are normal, no rashes or significant lesions EYES: normal, Conjunctiva are pink and non-injected, sclera clear NECK: supple, thyroid normal size, non-tender, without nodularity LYMPH:  no palpable lymphadenopathy in the cervical, axillary  LUNGS: clear to auscultation and percussion with normal breathing effort HEART: regular rate & rhythm and no murmurs and no lower extremity edema ABDOMEN:abdomen soft, non-tender and normal bowel sounds Musculoskeletal:no cyanosis of digits and no clubbing  NEURO: alert & oriented x 3 with fluent speech, no focal motor/sensory deficits Breasts: Breast inspection showed them to be symmetrical with no nipple discharge. Palpation of the breasts and axilla revealed no obvious mass that I could appreciate.   LABORATORY DATA:  I have reviewed the data as listed    Latest Ref Rng & Units 09/30/2023    7:42 AM 04/01/2023    8:55 AM 09/24/2022    9:06 AM  CBC  WBC 4.0 - 10.5 K/uL 6.1  6.3  6.1   Hemoglobin 12.0 - 15.0 g/dL 16.1  09.6  04.5   Hematocrit 36.0 - 46.0 % 38.5  39.2  36.9   Platelets 150 - 400 K/uL 395  369  360         Latest Ref Rng & Units 09/30/2023    7:42 AM 04/01/2023    8:55 AM 09/24/2022    9:06 AM  CMP  Glucose 70 - 99 mg/dL 409  811  914   BUN 6 - 20 mg/dL 23  21  20    Creatinine 0.44 - 1.00 mg/dL 7.82  9.56  2.13   Sodium 135 - 145 mmol/L 141  143  140   Potassium 3.5 - 5.1 mmol/L 3.8  4.0  4.0   Chloride 98 - 111 mmol/L 106  107  107   CO2 22 - 32 mmol/L 25  26  23    Calcium 8.9 - 10.3 mg/dL 9.5  08.6  9.6   Total Protein 6.5 - 8.1 g/dL 7.1  7.3  6.9   Total Bilirubin 0.0 - 1.2 mg/dL 0.6  0.5  0.5   Alkaline Phos 38 - 126 U/L 71  78  98   AST 15 - 41 U/L 16  18  14    ALT 0 - 44 U/L 20  22  17        RADIOGRAPHIC STUDIES: I have personally reviewed the radiological images as listed and agreed with the findings in the report. No results found.    Orders Placed This Encounter   Procedures   DG Bone Density    Standing Status:   Future    Expected Date:   12/31/2023  Expiration Date:   09/29/2024    Reason for Exam (SYMPTOM  OR DIAGNOSIS REQUIRED):   SCREENING    Is patient pregnant?:   No    Preferred [facility]   All questions were answered. The patient knows to call the clinic with any problems, questions or concerns. No barriers to learning was detected. The total time spent in the appointment was 25 minutes.     Malachy Mood, MD 09/30/2023

## 2023-10-01 ENCOUNTER — Other Ambulatory Visit: Payer: Self-pay

## 2023-10-01 ENCOUNTER — Telehealth: Payer: Self-pay | Admitting: Hematology

## 2023-10-01 DIAGNOSIS — Z170 Estrogen receptor positive status [ER+]: Secondary | ICD-10-CM

## 2023-10-01 DIAGNOSIS — E66812 Obesity, class 2: Secondary | ICD-10-CM

## 2023-10-01 NOTE — Telephone Encounter (Signed)
 Patient is aware of scheduled appointment times/dates per scheduling orders on 09/30/2023

## 2023-10-04 ENCOUNTER — Other Ambulatory Visit: Payer: Self-pay

## 2023-10-04 ENCOUNTER — Ambulatory Visit: Payer: Federal, State, Local not specified - PPO | Attending: General Surgery

## 2023-10-04 ENCOUNTER — Encounter (INDEPENDENT_AMBULATORY_CARE_PROVIDER_SITE_OTHER): Payer: Self-pay

## 2023-10-04 VITALS — Wt 230.0 lb

## 2023-10-04 DIAGNOSIS — Z483 Aftercare following surgery for neoplasm: Secondary | ICD-10-CM | POA: Insufficient documentation

## 2023-10-04 DIAGNOSIS — K869 Disease of pancreas, unspecified: Secondary | ICD-10-CM

## 2023-10-04 DIAGNOSIS — C50412 Malignant neoplasm of upper-outer quadrant of left female breast: Secondary | ICD-10-CM

## 2023-10-04 DIAGNOSIS — K862 Cyst of pancreas: Secondary | ICD-10-CM

## 2023-10-04 NOTE — Therapy (Signed)
 OUTPATIENT PHYSICAL THERAPY SOZO SCREENING NOTE   Patient Name: Joanna Osborn MRN: [id number] DOB:May 28, 1971, 53 y.o., female Today's Date: 10/04/2023  PCP: Milus Height, PA REFERRING PROVIDER: Emelia Loron, MD   PT End of Session - 10/04/23 [phone]     Visit Number 7   # unchanged due to screen only   PT Start Time 0921    PT Stop Time 0925    PT Time Calculation (min) 4 min    Activity Tolerance Patient tolerated treatment well    Behavior During Therapy [facility] for tasks assessed/performed              Past Medical History:  Diagnosis Date   Anemia    Anxiety    Chemotherapy-induced peripheral neuropathy (HCC)    per pt toes   Depression    Family history of colon cancer 03/19/2021   History of chemotherapy    left breast cancer  04-04-2021  to 07-26-2021 and restarted 08-14-2021   History of external beam radiation therapy    left breast cancer  10-02-2021  to 11-18-2021   History of kidney stones    Hypertension    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   Malignant neoplasm of upper-outer quadrant of left breast in female, 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   oncologist--- dr Mosetta Putt;  Stage IB, IDC, ER/ PR/ HER2 +;   chemo 04-04-2021  to 07-26-2021 and started 08-14-2021;  raditation completed 11-18-2021   Port-A-Cath in place 04/02/2021   Wears glasses    Past Surgical History:  Procedure Laterality Date   BIOPSY  02/19/2022   Procedure: BIOPSY;  Surgeon: Meridee Score Netty Starring., MD;  Location: [facility] ENDOSCOPY;  Service: Gastroenterology;;   BREAST LUMPECTOMY WITH RADIOACTIVE SEED AND SENTINEL LYMPH NODE BIOPSY Left 08/25/2021   Procedure: LEFT BREAST LUMPECTOMY WITH RADIOACTIVE SEED AND AXILLARY SENTINEL LYMPH NODE BIOPSY;  Surgeon: Emelia Loron, MD;  Location: [facility];  Service: General;  Laterality: Left;   CARPAL TUNNEL RELEASE Right 08/17/2008   @[facility]  by dr Merlyn Lot;   and excision wrist ganglian cyst   CHOLECYSTECTOMY, LAPAROSCOPIC   06/27/2002   @[facility]  by dr Lurene Shadow   COLONOSCOPY WITH PROPOFOL N/A 03/15/2023   Procedure: COLONOSCOPY WITH PROPOFOL;  Surgeon: Lemar Lofty., MD;  Location: [facility];  Service: Gastroenterology;  Laterality: N/A;   ESOPHAGOGASTRODUODENOSCOPY (EGD) WITH PROPOFOL N/A 02/19/2022   Procedure: ESOPHAGOGASTRODUODENOSCOPY (EGD) WITH PROPOFOL;  Surgeon: Meridee Score Netty Starring., MD;  Location: [facility] ENDOSCOPY;  Service: Gastroenterology;  Laterality: N/A;   ESOPHAGOGASTRODUODENOSCOPY (EGD) WITH PROPOFOL N/A 07/13/2022   Procedure: ESOPHAGOGASTRODUODENOSCOPY (EGD) WITH PROPOFOL;  Surgeon: Meridee Score Netty Starring., MD;  Location: [facility] ENDOSCOPY;  Service: Gastroenterology;  Laterality: N/A;   EUS N/A 02/19/2022   Procedure: UPPER ENDOSCOPIC ULTRASOUND (EUS) RADIAL;  Surgeon: Lemar Lofty., MD;  Location: [facility] ENDOSCOPY;  Service: Gastroenterology;  Laterality: N/A;   FINE NEEDLE ASPIRATION  02/19/2022   Procedure: FINE NEEDLE ASPIRATION (FNA) LINEAR;  Surgeon: Lemar Lofty., MD;  Location: [facility] ENDOSCOPY;  Service: Gastroenterology;;   HYSTEROSCOPY WITH D & C  11/21/2007   @[facility]  by dr Renaldo Fiddler;  polypectomy   INSERTION OF MESH N/A 10/28/2016   Procedure: INSERTION OF MESH;  Surgeon: Berna Bue, MD;  Location: [facility] OR;  Service: General;  Laterality: N/A;   LAPAROSCOPIC SALPINGO OOPHERECTOMY Bilateral 02/02/2022   Procedure: LAPAROSCOPIC BILATERAL SALPINGO OOPHORECTOMY;  Surgeon: Richarda Overlie, MD;  Location: [facility];  Service: Gynecology;  Laterality: Bilateral;   OOPHORECTOMY  PORT-A-CATH REMOVAL Right 08/24/2022   Procedure: REMOVAL PORT-A-CATH;  Surgeon: Emelia Loron, MD;  Location: [facility];  Service: General;  Laterality: Right;   PORTACATH PLACEMENT Right 04/02/2021   Procedure: INSERTION PORT-A-CATH;  Surgeon: Emelia Loron, MD;  Location: [facility];  Service: General;  Laterality: Right;   TUBAL LIGATION  1999    VENTRAL HERNIA REPAIR N/A 10/28/2016   Procedure: LAPAROSCOPIC VENTRAL HERNIA REPAIR;  Surgeon: Berna Bue, MD;  Location: [facility] OR;  Service: General;  Laterality: N/A;   Patient Active Problem List   Diagnosis Date Noted   Left breast abscess 09/12/2021   Essential hypertension 09/12/2021   Lesion of pancreas 09/12/2021   Hypokalemia 09/12/2021   Genetic testing 04/08/2021   Port-A-Cath in place 04/03/2021   Family history of colon cancer 03/19/2021   Malignant neoplasm of upper-outer quadrant of left breast in female, 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   Suicide ideation 07/07/2013   Major depressive disorder, recurrent severe without psychotic features (HCC) 07/06/2013    REFERRING DIAG: left breast cancer at risk for lymphedema  THERAPY DIAG: Aftercare following surgery for neoplasm  PERTINENT HISTORY: Patient was diagnosed on 03/05/2021 with left grade III invasive ductal carcinoma breast cancer. She underwent neoadjuvant chemotherapy from 04/03/2021-07/24/2021. She had a left lumpectomy and sentinel node biopsy (2 negative nodes) on 08/25/2021. It is triple positive with a Ki67 of 40%.   PRECAUTIONS: left UE Lymphedema risk, None  SUBJECTIVE: Pt returns for her last 3 month L-Dex screen.   PAIN:  Are you having pain? No  SOZO SCREENING: Patient was assessed today using the SOZO machine to determine the lymphedema index score. This was compared to her baseline score. It was determined that she is within the recommended range when compared to her baseline and no further action is needed at this time. She will continue SOZO screenings. These are done every 3 months for 2 years post operatively followed by every 6 months for 2 years, and then annually.  Did encourage pt to cont wear of compression sleeve during periods of highly repetitive activities, when increasing her HR (I.e. walking) and/or flying. Pt verbalized good understanding and was agreeable to this.      L-DEX FLOWSHEETS - 10/04/23 0900       L-DEX LYMPHEDEMA SCREENING   Measurement Type Unilateral    L-DEX MEASUREMENT EXTREMITY Upper Extremity    POSITION  Standing    DOMINANT SIDE Right    At Risk Side Left    BASELINE SCORE (UNILATERAL) -2.7    L-DEX SCORE (UNILATERAL) -2.4    VALUE CHANGE (UNILAT) 0.3             P: Begin 6 month L-Dex screens next.  Hermenia Bers, PTA 10/04/2023, 9:25 AM

## 2023-10-21 DIAGNOSIS — F4323 Adjustment disorder with mixed anxiety and depressed mood: Secondary | ICD-10-CM | POA: Diagnosis not present

## 2023-10-30 ENCOUNTER — Other Ambulatory Visit: Payer: Self-pay | Admitting: Podiatry

## 2023-11-18 DIAGNOSIS — F4323 Adjustment disorder with mixed anxiety and depressed mood: Secondary | ICD-10-CM | POA: Diagnosis not present

## 2023-11-22 ENCOUNTER — Telehealth: Payer: Self-pay | Admitting: Hematology

## 2023-11-24 ENCOUNTER — Ambulatory Visit (HOSPITAL_COMMUNITY)
Admission: RE | Admit: 2023-11-24 | Discharge: 2023-11-24 | Disposition: A | Discharge status: Home / Self Care | Source: Ambulatory Visit | Attending: Hematology | Admitting: Hematology

## 2023-11-24 DIAGNOSIS — K760 Fatty (change of) liver, not elsewhere classified: Secondary | ICD-10-CM | POA: Diagnosis not present

## 2023-11-24 DIAGNOSIS — K869 Disease of pancreas, unspecified: Secondary | ICD-10-CM | POA: Insufficient documentation

## 2023-11-24 DIAGNOSIS — K862 Cyst of pancreas: Secondary | ICD-10-CM | POA: Diagnosis not present

## 2023-11-24 DIAGNOSIS — C50412 Malignant neoplasm of upper-outer quadrant of left female breast: Secondary | ICD-10-CM | POA: Diagnosis not present

## 2023-11-24 DIAGNOSIS — Z170 Estrogen receptor positive status [ER+]: Secondary | ICD-10-CM | POA: Insufficient documentation

## 2023-11-24 DIAGNOSIS — Z9049 Acquired absence of other specified parts of digestive tract: Secondary | ICD-10-CM | POA: Diagnosis not present

## 2023-11-24 DIAGNOSIS — D259 Leiomyoma of uterus, unspecified: Secondary | ICD-10-CM | POA: Diagnosis not present

## 2023-11-24 DIAGNOSIS — R160 Hepatomegaly, not elsewhere classified: Secondary | ICD-10-CM | POA: Diagnosis not present

## 2023-11-24 MED ORDER — GADOBUTROL 1 MMOL/ML IV SOLN
10.0000 mL | Freq: Once | INTRAVENOUS | Status: AC | PRN
  Administered 2023-11-24: 10 mL via INTRAVENOUS

## 2023-12-01 ENCOUNTER — Inpatient Hospital Stay: Attending: Hematology | Admitting: Hematology

## 2023-12-01 ENCOUNTER — Encounter (INDEPENDENT_AMBULATORY_CARE_PROVIDER_SITE_OTHER): Payer: Self-pay | Admitting: Adult Health

## 2023-12-01 ENCOUNTER — Encounter: Payer: Self-pay | Admitting: Hematology

## 2023-12-01 ENCOUNTER — Ambulatory Visit (INDEPENDENT_AMBULATORY_CARE_PROVIDER_SITE_OTHER): Admitting: Adult Health

## 2023-12-01 ENCOUNTER — Other Ambulatory Visit: Payer: Self-pay

## 2023-12-01 VITALS — BP 134/86 | HR 90 | Temp 98.2°F | Ht 65.0 in | Wt 228.0 lb

## 2023-12-01 DIAGNOSIS — E669 Obesity, unspecified: Secondary | ICD-10-CM | POA: Diagnosis not present

## 2023-12-01 DIAGNOSIS — Z170 Estrogen receptor positive status [ER+]: Secondary | ICD-10-CM | POA: Diagnosis not present

## 2023-12-01 DIAGNOSIS — C50412 Malignant neoplasm of upper-outer quadrant of left female breast: Secondary | ICD-10-CM | POA: Diagnosis not present

## 2023-12-01 DIAGNOSIS — I10 Essential (primary) hypertension: Secondary | ICD-10-CM | POA: Diagnosis not present

## 2023-12-01 DIAGNOSIS — R739 Hyperglycemia, unspecified: Secondary | ICD-10-CM | POA: Diagnosis not present

## 2023-12-01 DIAGNOSIS — Z0289 Encounter for other administrative examinations: Secondary | ICD-10-CM

## 2023-12-01 DIAGNOSIS — E559 Vitamin D deficiency, unspecified: Secondary | ICD-10-CM | POA: Diagnosis not present

## 2023-12-01 DIAGNOSIS — K869 Disease of pancreas, unspecified: Secondary | ICD-10-CM | POA: Diagnosis not present

## 2023-12-01 DIAGNOSIS — Z6837 Body mass index (BMI) 37.0-37.9, adult: Secondary | ICD-10-CM

## 2023-12-01 NOTE — Assessment & Plan Note (Signed)
Stage IB, 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  S/p neoadjuvant TCHP, left lumpectomy, adjuvant radiation, and a year of Herceptin/Perjeta -She began adjuvant Zoladex until BSO and letrozole in 11/2021  -Joanna Osborn is clinically doing well.  Tolerating letrozole with moderate hot flashes, joint stiffness, hair thinning and brittle nails. I recommend skin/hair/nail vitamin or biotin.  Side effects are manageable.

## 2023-12-01 NOTE — Progress Notes (Signed)
 Office: [phone]  /  Fax: [phone]   Initial Visit  Joanna Osborn was seen in clinic today to evaluate for obesity. She is interested in losing weight to improve overall health and reduce the risk of weight related complications. She presents today to review program treatment options, initial physical assessment, and evaluation.     Discussed the use of AI scribe software for clinical note transcription with the patient, who gave verbal consent to proceed.  History of Present Illness         She was referred by: Specialist  When asked what else they would like to accomplish? She states: Adopt healthier eating patterns, Improve energy levels and physical activity, Improve existing medical conditions, Improve quality of life, Improve appearance, and She states "I want to get Osborn to me".  When asked how has your weight affected you? She states: Contributed to medical problems, Contributed to orthopedic problems or mobility issues, Having fatigue, Having poor endurance, Problems with eating patterns, and Has affected mood   Weight history: She reports weight gain since her Breast Cancer tx 2022.  She underwent lumpectomy, radiation, and chemotherapy.  Some associated conditions: Hypertension, Prediabetes, and Vitamin D Deficiency  Contributing factors: Family history of obesity, Disruption of circadian rhythm / sleep disordered breathing, Consumption of processed foods, Moderate to high levels of stress, Reduced physical activity, and Eating patterns  Weight promoting medications identified: Other: Femara   Current nutrition plan: None  Current level of physical activity: NEAT  Current or previous pharmacotherapy: Phentermine  Response to medication: Ineffective so it was discontinued   Past medical history includes:   Past Medical History:  Diagnosis Date   Anemia    Anxiety    Chemotherapy-induced peripheral neuropathy (HCC)    per pt toes   Depression    Family  history of colon cancer 03/19/2021   History of chemotherapy    left breast cancer  04-04-2021  to 07-26-2021 and restarted 08-14-2021   History of external beam radiation therapy    left breast cancer  10-02-2021  to 11-18-2021   History of kidney stones    Hypertension    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   Malignant neoplasm of upper-outer quadrant of left breast in female, 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   oncologist--- dr Maryalice Smaller;  Stage IB, IDC, ER/ PR/ HER2 +;   chemo 04-04-2021  to 07-26-2021 and started 08-14-2021;  raditation completed 11-18-2021   Port-A-Cath in place 04/02/2021   Wears glasses      Objective:   BP 134/86   Pulse 90   Temp 98.2 F (36.8 C)   Ht 5\' 5"  (1.651 m)   Wt 228 lb (103.4 kg)   SpO2 99%   BMI 37.94 kg/m  She was weighed on the bioimpedance scale: Body mass index is 37.94 kg/m.  Peak Weight:228 , Body Fat%:45.9, Visceral Fat Rating:13, Weight trend over the last 12 months: Increasing  General:  Alert, oriented and cooperative. Patient is in no acute distress.  Respiratory: Normal respiratory effort, no problems with respiration noted   Gait: able to ambulate independently  Mental Status: Normal mood and affect. Normal behavior. Normal judgment and thought content.   DIAGNOSTIC DATA REVIEWED:  BMET    Component Value Date/Time   NA 141 09/30/2023 0742   K 3.8 09/30/2023 0742   CL 106 09/30/2023 0742   CO2 25 09/30/2023 0742   GLUCOSE 133 (H) 09/30/2023 0742   BUN 23 (H) 09/30/2023 0742   CREATININE 0.85  09/30/2023 0742   CALCIUM 9.5 09/30/2023 0742   GFRNONAA >60 09/30/2023 0742   GFRAA >60 04/29/2018 2023   Lab Results  Component Value Date   HGBA1C 4.8 09/13/2021   No results found for: "INSULIN " CBC    Component Value Date/Time   WBC 6.1 09/30/2023 0742   WBC 5.3 02/24/2022 1117   RBC 4.35 09/30/2023 0742   HGB 12.6 09/30/2023 0742   HCT 38.5 09/30/2023 0742   PLT 395 09/30/2023 0742   MCV 88.5 09/30/2023 0742    MCH 29.0 09/30/2023 0742   MCHC 32.7 09/30/2023 0742   RDW 14.0 09/30/2023 0742   Iron/TIBC/Ferritin/ %Sat No results found for: "IRON", "TIBC", "FERRITIN", "IRONPCTSAT" Lipid Panel  No results found for: "CHOL", "TRIG", "HDL", "CHOLHDL", "VLDL", "LDLCALC", "LDLDIRECT" Hepatic Function Panel     Component Value Date/Time   PROT 7.1 09/30/2023 0742   ALBUMIN 4.3 09/30/2023 0742   AST 16 09/30/2023 0742   ALT 20 09/30/2023 0742   ALKPHOS 71 09/30/2023 0742   BILITOT 0.6 09/30/2023 0742   No results found for: "TSH"   Assessment and Plan:   Hypertension, unspecified type  Blood glucose elevated  Vitamin D deficiency  Obesity (BMI 30-39.9), STARTING BMI 38.0   Assessment and Plan      ESTABLISH WITH [facility]    Obesity Treatment / Action Plan:  Patient will work on garnering support from family and friends to begin weight loss journey. Will work on eliminating or reducing the presence of highly palatable, calorie dense foods in the home. Will complete provided nutritional and psychosocial assessment questionnaire before the next appointment. Will be scheduled for indirect calorimetry to determine resting energy expenditure in a fasting state.  This will allow us  to create a reduced calorie, high-protein meal plan to promote loss of fat mass while preserving muscle mass. Counseled on the health benefits of losing 5%-15% of total body weight. Was counseled on nutritional approaches to weight loss and benefits of reducing processed foods and consuming plant-based foods and high quality protein as part of nutritional weight management. Was counseled on pharmacotherapy and role as an adjunct in weight management.   Obesity Education Performed Today:  She was weighed on the bioimpedance scale and results were discussed and documented in the synopsis.  We discussed obesity as a disease and the importance of a more detailed evaluation of all the factors contributing to the  disease.  We discussed the importance of long term lifestyle changes which include nutrition, exercise and behavioral modifications as well as the importance of customizing this to her specific health and social needs.  We discussed the benefits of reaching a healthier weight to alleviate the symptoms of existing conditions and reduce the risks of the biomechanical, metabolic and psychological effects of obesity.  Joanna Osborn appears to be in the action stage of change and states they are ready to start intensive lifestyle modifications and behavioral modifications.  I have spent 40 minutes in the care of the patient today including: preparing to see patient (e.g. review and interpretation of tests, old notes ), obtaining and/or reviewing separately obtained history, performing a medically appropriate examination or evaluation, counseling and educating the patient, documenting clinical information in the electronic or other health care record, and independently interpreting results and communicating results to the patient, family, or caregiver   Reviewed by clinician on day of visit: allergies, medications, problem list, medical history, surgical history, family history, social history, and previous encounter notes pertinent to obesity  diagnosis.  Joanna Osborn d. Maheen Cwikla, NP-C

## 2023-12-01 NOTE — Progress Notes (Signed)
 [facility]   [phone] [phone]   Clinic Follow up Note   Patient Care Team: Diamond Formica, [place] as PCP - General (Physician Assistant) Alane Hsu, RN as Oncology Nurse Navigator Auther Bo, RN as Oncology Nurse Navigator Enid Harry, MD as Consulting Physician (General Surgery) Sonja Velarde, MD as Consulting Physician (Hematology) Johna Myers, MD as Consulting Physician (Radiation Oncology) Sonja Wyandotte, MD as Consulting Physician (Hematology) Imaging, [facility] as Consulting Physician (Diagnostic Radiology) 12/01/2023  I connected with Joanna Osborn Osborn on 12/01/23 at 10:40 AM EDT by telephone and verified that I am speaking with the correct person using two identifiers.   I discussed the limitations, risks, security and privacy concerns of performing an evaluation and management service by telephone and the availability of in person appointments. I also discussed with the patient that there may be a patient responsible charge related to this service. The patient expressed understanding and agreed to proceed.   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  S/p neoadjuvant TCHP, left lumpectomy, adjuvant radiation, and a year of Herceptin /Perjeta  -She began adjuvant Zoladex  until BSO and letrozole  in 11/2021  -Joanna Osborn Osborn is clinically doing well.  Tolerating letrozole  with moderate hot flashes, joint stiffness, hair thinning and brittle nails. I recommend skin/hair/nail vitamin or biotin.  Side effects are manageable.    Assessment & Plan Breast cancer Breast cancer is managed with Letrozole . No new side effects reported. Follow-up  appointment rescheduled to November to coincide with the next MRI review. 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 Recent MRI shows slight increase in size to 2.5 x 2.4 cm, but no significant change since EUS in 2023, per Dr. Brice Campi he. Tumor board review indicates no need for repeat biopsy. No high-risk features or lymph node involvement. Asymptomatic and comfortable with monitoring plan. - Repeat MRI in 6 months   Plan - I personally reviewed her abdominal MRI images, and discussed the findings with her.  Her case was reviewed in the GI tumor board this morning, Dr. Patti Border recommends imaging monitoring, we did not feel she needs repeat EUS and biopsy for now. - Follow-up in 6 months with lab and abdominal MRI 1 week before - Will cancel her follow-up in September 2025.     SUMMARY OF ONCOLOGIC HISTORY: Oncology History Overview Note   Cancer Staging  Malignant neoplasm of upper-outer quadrant of left breast in female, estrogen receptor positive (HCC) Staging form: Breast, AJCC 8th Edition - Clinical stage from 03/07/2021: Stage IB (cT2, cN0, cM0, G3, ER+, PR+, HER2+) - Signed by Sonja Valrico, MD on 03/17/2021 Stage prefix: Initial diagnosis Histologic grading system: 3 grade system - Pathologic stage from 08/25/2021: No Stage Recommended (ypT0, pN0, cM0, G3, ER+, PR+, HER2+) - Signed by Sonja [place], MD on 09/05/2021 Stage prefix: Post-therapy Response to neoadjuvant therapy: Complete response Nuclear grade: G3 Histologic grading system: 3 grade system     Malignant neoplasm of upper-outer quadrant of left breast in female, estrogen receptor positive (HCC)  03/05/2021 Mammogram   Diagnostic Left Mammogram; Left Breast Ultrasound  IMPRESSION: At the palpable site of concern  in the left breast at 12 o'clock there is a suspicious mass  measuring 2.1 cm.   03/07/2021 Cancer Staging   Staging form: Breast, AJCC 8th Edition - Clinical stage from 03/07/2021: Stage IB (cT2, cN0, cM0, G3, ER+, PR+, HER2+) - Signed by Sonja [place], MD on 03/17/2021 Stage prefix: Initial diagnosis Histologic grading system: 3 grade system   03/07/2021 Pathology Results   Diagnosis Breast, left, needle core biopsy, left breast 12 o' clock - INVASIVE DUCTAL CARCINOMA - SEE COMMENT Based on the biopsy, the carcinoma appears Nottingham grade 3 of 3  PROGNOSTIC INDICATORS Results: IMMUNOHISTOCHEMICAL AND MORPHOMETRIC ANALYSIS PERFORMED MANUALLY The tumor cells are POSITIVE for Her2 (3+). Estrogen Receptor: 30%, POSITIVE, WEAK STAINING INTENSITY Progesterone Receptor: 15%, POSITIVE, MODERATE STAINING INTENSITY Proliferation Marker Ki67: 40%     03/13/2021 Initial Diagnosis   Malignant neoplasm of upper-outer quadrant of left breast in female, estrogen receptor positive (HCC)   03/26/2021 Imaging   MRI Breast  IMPRESSION: 1. 2.5 cm biopsy-proven malignancy within the UPPER LEFT breast. 2. Abnormal lymph node with focal cortical thickening in the posterior UPPER-OUTER LEFT breast/LOWER LEFT axilla. 2nd-look ultrasound with possible biopsy is recommended. 3. Indeterminate 0.8 cm OUTER RIGHT breast mass. 2nd-look ultrasound with possible biopsy is recommended.   03/29/2021 Genetic Testing   Negative hereditary cancer genetic testing: no pathogenic variants detected in [facility] CustomNext-Cancer +RNAinsight Panel.  The report date is March 29, 2021.   The CustomNext-Cancer+RNAinsight panel offered by [employer] includes sequencing and rearrangement analysis for the following 47 genes:  APC, ATM, AXIN2, BARD1, BMPR1A, BRCA1, BRCA2, BRIP1, CDH1, CDK4, CDKN2A, CHEK2, DICER1, EPCAM, GREM1, HOXB13, MEN1, MLH1, MSH2, MSH3, MSH6, MUTYH, NBN, NF1, NF2, NTHL1, PALB2, PMS2, POLD1, POLE, PTEN, RAD51C, RAD51D, RECQL, RET, SDHA, SDHAF2, SDHB, SDHC, SDHD, SMAD4,  SMARCA4, STK11, TP53, TSC1, TSC2, and VHL.  RNA data is routinely analyzed for use in variant interpretation for all genes.   04/04/2021 - 07/26/2021 Chemotherapy   Patient is on Treatment Plan : BREAST  Docetaxel  + Carboplatin  + Trastuzumab  + Pertuzumab   (TCHP) q21d      04/14/2021 Imaging   US  Bilateral Breast  IMPRESSION: 1. No sonographic correlate for the 8 mm enhancing mass in the outer right breast. Recommendation is to prior seed with MRI guided biopsy. 2. An undulating, low lying lymph node along the 1 o'clock axis of the left breast demonstrates no areas of cortical thickening beyond 3 mm.   08/14/2021 - 03/12/2022 Chemotherapy   Patient is on Treatment Plan : BREAST Trastuzumab  + Pertuzumab  q21d     08/25/2021 Cancer Staging   Staging form: Breast, AJCC 8th Edition - Pathologic stage from 08/25/2021: No Stage Recommended (ypT0, pN0, cM0, G3, ER+, PR+, HER2+) - Signed by Sonja Benedict, MD on 09/05/2021 Stage prefix: Post-therapy Response to neoadjuvant therapy: Complete response Nuclear grade: G3 Histologic grading system: 3 grade system   08/25/2021 Definitive Surgery   FINAL MICROSCOPIC DIAGNOSIS:   A. BREAST, LEFT, LUMPECTOMY:  - No evidence of residual carcinoma - complete therapeutic response  - Therapy related changes, including hyalin fibrosis  - Fibrocystic change with urinary  - See comment   B. BREAST, LEFT ADDITIONAL MEDIAL MARGIN, EXCISION:  - Fibrocystic change  - Negative for carcinoma   C. BREAST, LEFT ADDITIONAL SUPERIOR MARGIN, EXCISION:  - Fibrocystic change  - Negative for carcinoma   D. BREAST, LEFT ADDITIONAL POSTERIOR MARGIN, EXCISION:  - Benign breast parenchyma  - Negative for carcinoma   E. BREAST, LEFT ADDITIONAL ANTERIOR MARGIN, EXCISION:  -  Benign breast parenchyma  - Negative for carcinoma   F. LYMPH NODE, LEFT AXILLARY, SENTINEL, EXCISION:  - Lymph node, negative for carcinoma (0/1)   G. LYMPH NODE, LEFT AXILLARY, SENTINEL, EXCISION:  -  Lymph node, negative for carcinoma (0/1)    COMMENT:  The appropriate pathologic stage (AJCC eighth edition) is ypT0 ypN0.      Discussed the use of AI scribe software for clinical note transcription with the patient, who gave verbal consent to proceed.  History of Present Illness SORINA LOWEY is a 53 year old female with a pancreatic cystic lesion who presents for follow-up.  An MRI last week shows the pancreatic cystic lesion has increased in size from 2 cm to 2.5 by 2.4 cm compared to a year ago. A biopsy in December 2023 confirmed the lesion is benign. She experiences no pain or discomfort related to this lesion.  She is on Letrozole  for breast cancer with no new side effects. Her last mammogram was in December. She reports no pain or discomfort.     REVIEW OF SYSTEMS:   Constitutional: Denies fevers, chills or abnormal weight loss Eyes: Denies blurriness of vision Ears, nose, mouth, throat, and face: Denies mucositis or sore throat Respiratory: Denies cough, dyspnea or wheezes Cardiovascular: Denies palpitation, chest discomfort or lower extremity swelling Gastrointestinal:  Denies nausea, heartburn or change in bowel habits Skin: Denies abnormal skin rashes Lymphatics: Denies new lymphadenopathy or easy bruising Neurological:Denies numbness, tingling or new weaknesses Behavioral/Psych: Mood is stable, no new changes  All other systems were reviewed with the patient and are negative.  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   Depression    Family history of colon cancer 03/19/2021   History of chemotherapy    left breast cancer  04-04-2021  to 07-26-2021 and restarted 08-14-2021   History of external beam radiation therapy    left breast cancer  10-02-2021  to 11-18-2021   History of kidney stones    Hypertension    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   Malignant  neoplasm of upper-outer quadrant of left breast in female, 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   oncologist--- dr Maryalice Smaller;  Stage IB, IDC, ER/ PR/ HER2 +;   chemo 04-04-2021  to 07-26-2021 and started 08-14-2021;  raditation completed 11-18-2021   Port-A-Cath in place 04/02/2021   Wears glasses     SURGICAL HISTORY: Past Surgical History:  Procedure Laterality Date   BIOPSY  02/19/2022   Procedure: BIOPSY;  Surgeon: Normie Becton., MD;  Location: [facility] ENDOSCOPY;  Service: Gastroenterology;;   BREAST LUMPECTOMY WITH RADIOACTIVE SEED AND SENTINEL LYMPH NODE BIOPSY Left 08/25/2021   Procedure: LEFT BREAST LUMPECTOMY WITH RADIOACTIVE SEED AND AXILLARY SENTINEL LYMPH NODE BIOPSY;  Surgeon: Enid Harry, MD;  Location: [facility];  Service: General;  Laterality: Left;   CARPAL TUNNEL RELEASE Right 08/17/2008   @[facility]  by dr Huntley Mai;   and excision wrist ganglian cyst   CHOLECYSTECTOMY, LAPAROSCOPIC  06/27/2002   @[facility]  by dr Kristan Petit   COLONOSCOPY WITH PROPOFOL  N/A 03/15/2023   Procedure: COLONOSCOPY WITH PROPOFOL ;  Surgeon: Normie Becton., MD;  Location: [facility];  Service: Gastroenterology;  Laterality: N/A;   ESOPHAGOGASTRODUODENOSCOPY (EGD) WITH PROPOFOL  N/A 02/19/2022   Procedure: ESOPHAGOGASTRODUODENOSCOPY (EGD) WITH PROPOFOL ;  Surgeon: Brice Campi Albino Alu., MD;  Location: [facility] ENDOSCOPY;  Service: Gastroenterology;  Laterality: N/A;  ESOPHAGOGASTRODUODENOSCOPY (EGD) WITH PROPOFOL  N/A 07/13/2022   Procedure: ESOPHAGOGASTRODUODENOSCOPY (EGD) WITH PROPOFOL ;  Surgeon: Brice Campi Albino Alu., MD;  Location: [facility] ENDOSCOPY;  Service: Gastroenterology;  Laterality: N/A;   EUS N/A 02/19/2022   Procedure: UPPER ENDOSCOPIC ULTRASOUND (EUS) RADIAL;  Surgeon: Normie Becton., MD;  Location: [facility] ENDOSCOPY;  Service: Gastroenterology;  Laterality: N/A;   FINE NEEDLE ASPIRATION  02/19/2022   Procedure: FINE NEEDLE ASPIRATION (FNA) LINEAR;  Surgeon: Normie Becton., MD;  Location: [facility] ENDOSCOPY;  Service: Gastroenterology;;   HYSTEROSCOPY WITH D & C  11/21/2007   @[facility]  by dr Avis Boehringer;  polypectomy   INSERTION OF MESH N/A 10/28/2016   Procedure: INSERTION OF MESH;  Surgeon: Adalberto Acton, MD;  Location: [facility] OR;  Service: General;  Laterality: N/A;   LAPAROSCOPIC SALPINGO OOPHERECTOMY Bilateral 02/02/2022   Procedure: LAPAROSCOPIC BILATERAL SALPINGO OOPHORECTOMY;  Surgeon: Woodrow Hazy, MD;  [facility];  Service: Gynecology;  Laterality: Bilateral;   OOPHORECTOMY     PORT-A-CATH REMOVAL Right 08/24/2022   Procedure: REMOVAL PORT-A-CATH;  Surgeon: Enid Harry, MD;  Location: [facility];  Service: General;  Laterality: Right;   PORTACATH PLACEMENT Right 04/02/2021   Procedure: INSERTION PORT-A-CATH;  Surgeon: Enid Harry, MD;  Location: [facility];  Service: General;  Laterality: Right;   TUBAL LIGATION  1999   VENTRAL HERNIA REPAIR N/A 10/28/2016   Procedure: LAPAROSCOPIC VENTRAL HERNIA REPAIR;  Surgeon: Adalberto Acton, MD;  Location: [facility] OR;  Service: General;  Laterality: N/A;    I have reviewed the social history and family history with the patient and they are unchanged from previous note.  ALLERGIES:  is allergic to endocet [oxycodone -acetaminophen ], propoxyphene, and tape.  MEDICATIONS:  Current Outpatient Medications  Medication Sig Dispense Refill   Cholecalciferol (VITAMIN D3 PO) Take 1 tablet by mouth daily.     ergocalciferol (VITAMIN D2) 1.25 MG (50000 UT) capsule Take 50,000 Units by mouth once a week.     gabapentin  (NEURONTIN ) 100 MG capsule Take 1 capsule (100 mg total) by mouth at bedtime. 30 capsule 3   hydrochlorothiazide  (HYDRODIURIL ) 25 MG tablet Take 1 tablet (25 mg total) by mouth daily. (Patient taking differently: Take 25 mg by mouth daily.) 30 tablet 0   ketoconazole  (NIZORAL ) 2 % cream Apply 1 Application topically daily. 60 g 2   letrozole   (FEMARA ) 2.5 MG tablet Take 1 tablet (2.5 mg total) by mouth daily. 90 tablet 3   Magnesium  300 MG CAPS 1 capsule with a meal Orally Once a day     meloxicam  (MOBIC ) 7.5 MG tablet TAKE 2 TABLETS BY MOUTH EVERY DAY 60 tablet 1   methocarbamol (ROBAXIN) 500 MG tablet Take 1 tablet 3 times a day by oral route as needed.     omeprazole  (PRILOSEC) 40 MG capsule TAKE 1 CAPSULE (40 MG TOTAL) BY MOUTH 2 (TWO) TIMES DAILY BEFORE A MEAL. 180 capsule 0   ondansetron  (ZOFRAN -ODT) 4 MG disintegrating tablet Take 1 tablet (4 mg total) by mouth every 8 (eight) hours as needed for nausea or vomiting. 20 tablet 0   No current facility-administered medications for this visit.    PHYSICAL EXAMINATION: Not performed   LABORATORY DATA:  I have reviewed the data as listed    Latest Ref Rng & Units 09/30/2023    7:42 AM 04/01/2023    8:55 AM 09/24/2022    9:06 AM  CBC  WBC 4.0 - 10.5 K/uL 6.1  6.3  6.1  Hemoglobin 12.0 - 15.0 g/dL 40.9  81.1  91.4   Hematocrit 36.0 - 46.0 % 38.5  39.2  36.9   Platelets 150 - 400 K/uL 395  369  360         Latest Ref Rng & Units 09/30/2023    7:42 AM 04/01/2023    8:55 AM 09/24/2022    9:06 AM  CMP  Glucose 70 - 99 mg/dL 782  956  213   BUN 6 - 20 mg/dL 23  21  20    Creatinine 0.44 - 1.00 mg/dL 0.86  5.78  4.69   Sodium 135 - 145 mmol/L 141  143  140   Potassium 3.5 - 5.1 mmol/L 3.8  4.0  4.0   Chloride 98 - 111 mmol/L 106  107  107   CO2 22 - 32 mmol/L 25  26  23    Calcium 8.9 - 10.3 mg/dL 9.5  62.9  9.6   Total Protein 6.5 - 8.1 g/dL 7.1  7.3  6.9   Total Bilirubin 0.0 - 1.2 mg/dL 0.6  0.5  0.5   Alkaline Phos 38 - 126 U/L 71  78  98   AST 15 - 41 U/L 16  18  14    ALT 0 - 44 U/L 20  22  17        RADIOGRAPHIC STUDIES: I have personally reviewed the radiological images as listed and agreed with the findings in the report. No results found.     I discussed the assessment and treatment plan with the patient. The patient was provided an opportunity to ask  questions and all were answered. The patient agreed with the plan and demonstrated an understanding of the instructions.   The patient was advised to call back or seek an in-person evaluation if the symptoms worsen or if the condition fails to improve as anticipated.  I provided 25 minutes of non face-to-face telephone visit time during this encounter, including care coordination.     Sonja [place], MD 12/01/23

## 2023-12-03 ENCOUNTER — Encounter: Payer: Self-pay | Admitting: Hematology

## 2023-12-03 NOTE — Progress Notes (Signed)
 The proposed treatment discussed in conference is for discussion purpose only and is not a binding recommendation.  The patients have not been physically examined, or presented with their treatment options.  Therefore, final treatment plans cannot be decided.

## 2023-12-08 DIAGNOSIS — Z23 Encounter for immunization: Secondary | ICD-10-CM | POA: Diagnosis not present

## 2023-12-23 DIAGNOSIS — F4323 Adjustment disorder with mixed anxiety and depressed mood: Secondary | ICD-10-CM | POA: Diagnosis not present

## 2024-01-03 ENCOUNTER — Other Ambulatory Visit: Payer: Self-pay | Admitting: Podiatry

## 2024-01-04 ENCOUNTER — Ambulatory Visit: Admitting: Podiatry

## 2024-01-13 DIAGNOSIS — F4323 Adjustment disorder with mixed anxiety and depressed mood: Secondary | ICD-10-CM | POA: Diagnosis not present

## 2024-01-17 ENCOUNTER — Encounter (INDEPENDENT_AMBULATORY_CARE_PROVIDER_SITE_OTHER): Payer: Self-pay

## 2024-01-20 ENCOUNTER — Encounter (INDEPENDENT_AMBULATORY_CARE_PROVIDER_SITE_OTHER): Payer: Self-pay | Admitting: Family Medicine

## 2024-01-20 ENCOUNTER — Ambulatory Visit (INDEPENDENT_AMBULATORY_CARE_PROVIDER_SITE_OTHER): Admitting: Family Medicine

## 2024-01-20 VITALS — BP 132/84 | HR 86 | Temp 98.1°F | Ht 65.0 in | Wt 230.0 lb

## 2024-01-20 DIAGNOSIS — E785 Hyperlipidemia, unspecified: Secondary | ICD-10-CM | POA: Diagnosis not present

## 2024-01-20 DIAGNOSIS — R7303 Prediabetes: Secondary | ICD-10-CM | POA: Diagnosis not present

## 2024-01-20 DIAGNOSIS — E559 Vitamin D deficiency, unspecified: Secondary | ICD-10-CM | POA: Diagnosis not present

## 2024-01-20 DIAGNOSIS — R5383 Other fatigue: Secondary | ICD-10-CM | POA: Diagnosis not present

## 2024-01-20 DIAGNOSIS — Z1331 Encounter for screening for depression: Secondary | ICD-10-CM | POA: Diagnosis not present

## 2024-01-20 DIAGNOSIS — C50412 Malignant neoplasm of upper-outer quadrant of left female breast: Secondary | ICD-10-CM

## 2024-01-20 DIAGNOSIS — R0609 Other forms of dyspnea: Secondary | ICD-10-CM

## 2024-01-20 DIAGNOSIS — R0602 Shortness of breath: Secondary | ICD-10-CM

## 2024-01-20 DIAGNOSIS — Z6838 Body mass index (BMI) 38.0-38.9, adult: Secondary | ICD-10-CM

## 2024-01-20 DIAGNOSIS — I10 Essential (primary) hypertension: Secondary | ICD-10-CM

## 2024-01-20 DIAGNOSIS — Z170 Estrogen receptor positive status [ER+]: Secondary | ICD-10-CM

## 2024-01-20 DIAGNOSIS — E669 Obesity, unspecified: Secondary | ICD-10-CM

## 2024-01-20 NOTE — Progress Notes (Signed)
 Office: [phone]  /  Fax: [phone]  WEIGHT SUMMARY AND BIOMETRICS  Anthropometric Measurements Height: 5' 5 (1.651 m) Weight: 230 lb (104.3 kg) BMI (Calculated): 38.27 Weight at Last Visit: N/A Weight Lost Since Last Visit: N/A Weight Gained Since Last Visit: N/A Starting Weight: 230 lb Peak Weight: 230 lb   Body Composition  Body Fat %: 44.4 % Fat Mass (lbs): 102.4 lbs Muscle Mass (lbs): 121.8 lbs Total Body Water (lbs): 82.6 lbs Visceral Fat Rating : 13   Other Clinical Data Fasting: Yes Labs: Yes Today's Visit #: 1 Starting Date: 01/20/24 Comments: First Visit    Chief Complaint: OBESITY   History of Present Illness Joanna Osborn is a 53 year old female with obesity who presents for evaluation of obesity and related complications.  She experiences complications related to obesity, including hypertension, prediabetes, hyperlipidemia, and fatigue. 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 She does not regularly weigh herself outside of medical visits.  She has a history of left breast cancer, which influenced her dietary habits. Initially, she followed a pescatarian diet after a church fast but reintroduced chicken and [place] following her cancer diagnosis. Despite her previous dietary habits, she finds it difficult to stop meat consumption.  She experiences slight shortness of breath when walking up a flight of stairs and significant shortness of breath after three flights. Her knees and back are affected by prolonged standing at work.  Her husband is diabetic and proactive about his diet, which may influence her dietary choices. Her 61 year old son has recently had concerning lab results, indicating a need for dietary changes as well.  She has filled out her new patient paperwork, including her obesity and comorbidities questionnaire. It was reviewed in depth today,  and pertinent positives are as follows: Comorbidities include fatigue, which is likely related to her weight but may not be completely attributable to weight. She experiences dyspnea on exertion when walking one flight of stairs. She has hypertension, which she would like to improve with weight loss and potentially reduce her medication. She is pre-diabetic and aims to improve this condition with weight loss to avoid the onset of type 2 diabetes. She also has hyperlipidemia, which she hopes to address through diet, exercise, and weight loss. Furthermore, she has a vitamin D deficiency, which could be contributing to her fatigue and affecting her weight. Additionally, she has a history of left breast cancer, for which she is currently undergoing treatment.   In her social history, she has not undergone weight loss surgery and is not currently interested in that option. 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 She is married and lives with her husband and her 12 year old son, who she believes will be supportive of her weight loss efforts and will eat healthier alongside her. Her husband has type 2 diabetes, and her son is beginning to experience health issues related to his weight, so it would be appropriate for them to eat healthier together. Currently, she does not engage in structured exercise, although she is on her feet for much of the day.   Regarding her weight history, her current weight is 230 pounds, she is 5 feet 5 inches tall, and her desired weight is 170 pounds, which she would like to reach in 8 months. She is at her heaviest weight now and is generally not a yo-yo dieter. She has previously attempted intermittent  fasting without significant weight loss and has not identified any specific dietary strategies that have been particularly helpful for her weight loss from an eating standpoint.   In terms of her nutritional history, she eats outside the home 5 to 6 times per week  and consumes fast food or takeout that frequently. She does most of the grocery shopping, and she and her husband share the cooking responsibilities; however, she does not enjoy cooking. She notes cravings for potatoes and sweets but dislikes bananas, Brussels sprouts, and cottage cheese. She generally does not consider herself a picky eater. She snacks frequently between meals and often misses meals, particularly breakfast, and frequently misses lunch. She tends to snack at night, especially on chips, cheese cubes, fruit snacks like gummies, and popcorn. Sometimes, she wakes up in the middle of the night feeling hungry. She has tried to adopt a more vegetarian-style diet for health reasons but does consume poultry and seafood occasionally. She drinks regular soda 4 to 5 days a week and considers sweets, pasta, and potatoes her worst food habits. Although she struggles with poor food choices at times, she generally denies having excessive hunger. She has some issues with portion control, tends to stop eating when she feels comfortably full, and does not usually overeat to the point of discomfort. She frequently eats quickly, especially while at work and multitasking.   Regarding her mood and food relationship, she tends to eat when stressed, sad, as a reward, when bored, and for comfort. She did not grow up feeling judged about her eating or weight and does not feel out of control with her eating. She reports consuming larger portions than the average person about once a week or less. She has never been diagnosed with an eating disorder and does not consider that she has a history of one.   Testing results indicate a modified PH-9 mood and food score mildly elevated at 6. Her Epworth sleepiness score is elevated at 15. An EKG performed today due to her fatigue shows normal sinus rhythm with a heart rate of 76 beats per minute. Her calculated basal metabolic rate is 8208, and her measured resting energy  expenditure via indirect calorimetry is 1901.      PHYSICAL EXAM:  Blood pressure 132/84, pulse 86, temperature 98.1 F (36.7 C), height 5' 5 (1.651 m), weight 230 lb (104.3 kg), SpO2 97%. Body mass index is 38.27 kg/m.  DIAGNOSTIC DATA REVIEWED:  BMET    Component Value Date/Time   NA 141 09/30/2023 0742   K 3.8 09/30/2023 0742   CL 106 09/30/2023 0742   CO2 25 09/30/2023 0742   GLUCOSE 133 (H) 09/30/2023 0742   BUN 23 (H) 09/30/2023 0742   CREATININE 0.85 09/30/2023 0742   CALCIUM 9.5 09/30/2023 0742   GFRNONAA >60 09/30/2023 0742   GFRAA >60 04/29/2018 2023   Lab Results  Component Value Date   HGBA1C 4.8 09/13/2021   No results found for: INSULIN  No results found for: TSH CBC    Component Value Date/Time   WBC 6.1 09/30/2023 0742   WBC 5.3 02/24/2022 1117   RBC 4.35 09/30/2023 0742   HGB 12.6 09/30/2023 0742   HCT 38.5 09/30/2023 0742   PLT 395 09/30/2023 0742   MCV 88.5 09/30/2023 0742   MCH 29.0 09/30/2023 0742   MCHC 32.7 09/30/2023 0742   RDW 14.0 09/30/2023 0742   Iron Studies No results found for: IRON, TIBC, FERRITIN, IRONPCTSAT Lipid Panel  No results found for: CHOL,  TRIG, HDL, CHOLHDL, VLDL, LDLCALC, LDLDIRECT Hepatic Function Panel     Component Value Date/Time   PROT 7.1 09/30/2023 0742   ALBUMIN 4.3 09/30/2023 0742   AST 16 09/30/2023 0742   ALT 20 09/30/2023 0742   ALKPHOS 71 09/30/2023 0742   BILITOT 0.6 09/30/2023 0742   No results found for: TSH Nutritional No results found for: VD25OH   Assessment and Plan Assessment & Plan Obesity Obesity is influenced by genetic and physiological factors, including reduced metabolic rate, increased hunger signals, and enhanced fat storage. Her work schedule as a Associate Professor contributes to meal skipping, impacting her weight. Emphasized the importance of nutrition and individualized eating plans to address obesity. Discussed that different diets affect  individuals differently, and the goal is to find what works best for her. - Initiate category two eating plan with regular foods meeting 26 nutritional criteria. - Provide meal plan and grocery list. - Instruct to consume all assigned foods daily, regardless of timing. - Advise against weighing herself between visits to prevent discouragement. - Schedule follow-up in two weeks to review test results and evaluate the eating plan's effectiveness.  Fatigue May be due to other causes besides obesity -check labs Start treating obesity as above  Prediabetes Diagnosed with prediabetes. The eating plan will also aid in managing prediabetes. Her husband's diabetic diet may support her dietary changes. - Include her husband in dietary changes as the plan is suitable for managing prediabetes. - Review test results in two weeks to assess impact on blood glucose levels.  Hypertension Hypertension is addressed as part of obesity-related complications. - Monitor blood pressure as part of overall health assessment. - Review test results in two weeks to assess impact on blood pressure.  Hyperlipidemia Hyperlipidemia is addressed as part of obesity-related complications. Dietary changes could impact cholesterol levels. - Implement dietary changes as part of the category two eating plan to manage cholesterol levels. - Review test results in two weeks to assess impact on cholesterol levels.  Dyspnea on exertion Experiences slight dyspnea on exertion, particularly when climbing stairs. Aim to improve exercise tolerance and reduce knee pain. No additional exercise assigned at this stage. - Engage in chair aerobics once a week. - Reassess exercise tolerance and dyspnea in future visits.  Vitamin D deficiency Vitamin D deficiency is part of the workup. - Order blood tests to assess vitamin D levels. - Review test results in two weeks to determine if supplementation is needed.  Left breast  cancer Reviewed oncologist's notes to ensure coordination of care. Antioxidant-rich foods like berries are included in the diet to reduce the risk of cancer recurrence. - Coordinate with oncologist to ensure comprehensive care. - Include antioxidant-rich foods like berries in the diet to reduce the risk of cancer recurrence.  General Health Maintenance Discussed the importance of nutrition and exercise in overall health maintenance. Emphasized the role of protein in metabolism and healing. - Encourage meal planning and preparation to support dietary changes. - Advise using a food scale to ensure accurate portion sizes, particularly for protein intake.  Follow-up Scheduled for follow-up appointments to review test results and assess the effectiveness of the dietary plan. - Schedule follow-up appointment in two weeks to review test results and discuss the eating plan. - Make an additional appointment two weeks after the next visit to ensure continuity of care.     I have personally spent 57 minutes total time today in preparation, patient care, and documentation for this visit, including the following: review of  clinical lab tests; review of medical history, review of Obesity history, review of eating history and nutritional counseling.  She was informed of the importance of frequent follow up visits to maximize her success with intensive lifestyle modifications for her multiple health conditions.    Louann Penton, MD

## 2024-01-21 LAB — CBC WITH DIFFERENTIAL/PLATELET
Basophils Absolute: 0 10*3/uL (ref 0.0–0.2)
Basos: 0 %
EOS (ABSOLUTE): 0.2 10*3/uL (ref 0.0–0.4)
Eos: 3 %
Hematocrit: 40.3 % (ref 34.0–46.6)
Hemoglobin: 13 g/dL (ref 11.1–15.9)
Immature Grans (Abs): 0 10*3/uL (ref 0.0–0.1)
Immature Granulocytes: 0 %
Lymphocytes Absolute: 2.2 10*3/uL (ref 0.7–3.1)
Lymphs: 32 %
MCH: 28.7 pg (ref 26.6–33.0)
MCHC: 32.3 g/dL (ref 31.5–35.7)
MCV: 89 fL (ref 79–97)
Monocytes Absolute: 0.3 10*3/uL (ref 0.1–0.9)
Monocytes: 5 %
Neutrophils Absolute: 4.2 10*3/uL (ref 1.4–7.0)
Neutrophils: 60 %
Platelets: 364 10*3/uL (ref 150–450)
RBC: 4.53 x10E6/uL (ref 3.77–5.28)
RDW: 13.3 % (ref 11.7–15.4)
WBC: 6.9 10*3/uL (ref 3.4–10.8)

## 2024-01-21 LAB — CMP14+EGFR
ALT: 22 IU/L (ref 0–32)
AST: 20 IU/L (ref 0–40)
Albumin: 4.6 g/dL (ref 3.8–4.9)
Alkaline Phosphatase: 85 IU/L (ref 44–121)
BUN/Creatinine Ratio: 29 — ABNORMAL HIGH (ref 9–23)
BUN: 23 mg/dL (ref 6–24)
Bilirubin Total: 0.4 mg/dL (ref 0.0–1.2)
CO2: 20 mmol/L (ref 20–29)
Calcium: 10.1 mg/dL (ref 8.7–10.2)
Chloride: 104 mmol/L (ref 96–106)
Creatinine, Ser: 0.78 mg/dL (ref 0.57–1.00)
Globulin, Total: 2.6 g/dL (ref 1.5–4.5)
Glucose: 102 mg/dL — ABNORMAL HIGH (ref 70–99)
Potassium: 4.2 mmol/L (ref 3.5–5.2)
Sodium: 143 mmol/L (ref 134–144)
Total Protein: 7.2 g/dL (ref 6.0–8.5)
eGFR: 92 mL/min/{1.73_m2} (ref >59)

## 2024-01-21 LAB — HEMOGLOBIN A1C
Est. average glucose Bld gHb Est-mCnc: 134 mg/dL
Hgb A1c MFr Bld: 6.3 % — ABNORMAL HIGH (ref 4.8–5.6)

## 2024-01-21 LAB — FOLATE: Folate: 7.3 ng/mL (ref >3.0)

## 2024-01-21 LAB — T3: T3, Total: 115 ng/dL (ref 71–180)

## 2024-01-21 LAB — LIPID PANEL WITH LDL/HDL RATIO
Cholesterol, Total: 168 mg/dL (ref 100–199)
HDL: 46 mg/dL (ref >39)
LDL Chol Calc (NIH): 104 mg/dL — ABNORMAL HIGH (ref 0–99)
LDL/HDL Ratio: 2.3 ratio (ref 0.0–3.2)
Triglycerides: 96 mg/dL (ref 0–149)
VLDL Cholesterol Cal: 18 mg/dL (ref 5–40)

## 2024-01-21 LAB — VITAMIN D 25 HYDROXY (VIT D DEFICIENCY, FRACTURES): Vit D, 25-Hydroxy: 82.9 ng/mL (ref 30.0–100.0)

## 2024-01-21 LAB — INSULIN, RANDOM: INSULIN: 38.1 u[IU]/mL — ABNORMAL HIGH (ref 2.6–24.9)

## 2024-01-21 LAB — VITAMIN B12: Vitamin B-12: 403 pg/mL (ref 232–1245)

## 2024-01-21 LAB — TSH: TSH: 1.21 u[IU]/mL (ref 0.450–4.500)

## 2024-01-21 LAB — T4, FREE: Free T4: 1.2 ng/dL (ref 0.82–1.77)

## 2024-02-03 ENCOUNTER — Encounter (INDEPENDENT_AMBULATORY_CARE_PROVIDER_SITE_OTHER): Payer: Self-pay | Admitting: Family Medicine

## 2024-02-03 ENCOUNTER — Ambulatory Visit (INDEPENDENT_AMBULATORY_CARE_PROVIDER_SITE_OTHER): Admitting: Podiatry

## 2024-02-03 ENCOUNTER — Ambulatory Visit (INDEPENDENT_AMBULATORY_CARE_PROVIDER_SITE_OTHER): Admitting: Family Medicine

## 2024-02-03 VITALS — Ht 65.0 in | Wt 230.0 lb

## 2024-02-03 VITALS — BP 120/78 | HR 85 | Temp 98.0°F | Ht 65.0 in | Wt 224.0 lb

## 2024-02-03 DIAGNOSIS — R7303 Prediabetes: Secondary | ICD-10-CM

## 2024-02-03 DIAGNOSIS — M722 Plantar fascial fibromatosis: Secondary | ICD-10-CM | POA: Diagnosis not present

## 2024-02-03 DIAGNOSIS — I10 Essential (primary) hypertension: Secondary | ICD-10-CM | POA: Diagnosis not present

## 2024-02-03 DIAGNOSIS — Z6836 Body mass index (BMI) 36.0-36.9, adult: Secondary | ICD-10-CM | POA: Insufficient documentation

## 2024-02-03 DIAGNOSIS — E559 Vitamin D deficiency, unspecified: Secondary | ICD-10-CM | POA: Diagnosis not present

## 2024-02-03 DIAGNOSIS — K862 Cyst of pancreas: Secondary | ICD-10-CM

## 2024-02-03 DIAGNOSIS — E66812 Obesity, class 2: Secondary | ICD-10-CM | POA: Insufficient documentation

## 2024-02-03 DIAGNOSIS — Z6837 Body mass index (BMI) 37.0-37.9, adult: Secondary | ICD-10-CM

## 2024-02-03 DIAGNOSIS — E669 Obesity, unspecified: Secondary | ICD-10-CM | POA: Diagnosis not present

## 2024-02-03 MED ORDER — VITAMIN D (ERGOCALCIFEROL) 1.25 MG (50000 UNIT) PO CAPS: ORAL_CAPSULE | ORAL | 0 refills | Status: DC

## 2024-02-03 MED ORDER — METFORMIN HCL 500 MG PO TABS: 500.0000 mg | ORAL_TABLET | Freq: Every day | ORAL | 0 refills | Status: DC

## 2024-02-03 NOTE — Progress Notes (Signed)
 Office: [phone]  /  Fax: [phone]  WEIGHT SUMMARY AND BIOMETRICS  Anthropometric Measurements Height: 5' 5 (1.651 m) Weight: 224 lb (101.6 kg) BMI (Calculated): 37.28 Weight at Last Visit: 230 lb Weight Lost Since Last Visit: 6 lb Weight Gained Since Last Visit: 0 Starting Weight: 230 lb Total Weight Loss (lbs): 6 lb (2.722 kg) Peak Weight: 230 lb   Body Composition  Body Fat %: 43.7 % Fat Mass (lbs): 98 lbs Muscle Mass (lbs): 120 lbs Total Body Water (lbs): 82 lbs Visceral Fat Rating : 12   Other Clinical Data RMR: 1814 Fasting: yes Labs: no Today's Visit #: 2 Starting Date: 01/20/24    Chief Complaint: OBESITY    History of Present Illness Joanna Osborn is a 53 year old female with obesity who presents for a follow-up on her obesity treatment plan and review of recent lab results.  She has been adhering to a category two eating plan approximately 75% of the time and has incorporated chair aerobics into her routine, performing them for 60 minutes twice a week. She has experienced a weight loss of six pounds over the past two weeks. She is working on meal planning and finds that combining her breakfast and lunch helps manage hunger better throughout the day, allowing for a snack before dinner. She faced challenges maintaining her eating plan during the Fourth of July but resumed her routine without difficulty.  Recent lab results show an improvement in her glucose level to 102 from a previous 133. Her cholesterol levels indicate an HDL of 46, below the desired level of 50, and an LDL of 104, slightly above the target of under 100. Her triglycerides are within normal range. Her B12 level is slightly low, but she is on a B12-rich diet. Her vitamin D  level is 82.9, approaching the upper limit, and she is on a prescription of 50,000 IU weekly.  She has a history of a cystic lesion on her pancreas, which was noted to have grown on a recent MRI. She had an MRI and a  biopsy previously, with a follow-up planned around November. She is concerned about the impact of this lesion on her health, but it is unlikely to be  affecting her insulin  production or glucose levels at this time   Her hemoglobin A1c has increased from 4.8 in February 2023 to 6.3, indicating she is closer to the diabetic range. Her fasting insulin  level was 38, suggesting her pancreas is overworking. She is following a diet plan that includes adequate protein and controlled carbohydrates.  She is planning a trip to [place]  and [place]  and is strategizing to maintain her diet while on vacation. She plans to prepare her own meals when possible and is mindful of her protein intake. She is aware of the challenges of avoiding high-calorie snacks like churros and popcorn at theme parks.      PHYSICAL EXAM:  Blood pressure 120/78, pulse 85, temperature 98 F (36.7 C), height 5' 5 (1.651 m), weight 224 lb (101.6 kg), SpO2 97%. Body mass index is 37.28 kg/m.  DIAGNOSTIC DATA REVIEWED:  BMET    Component Value Date/Time   NA 143 01/20/2024 0916   K 4.2 01/20/2024 0916   CL 104 01/20/2024 0916   CO2 20 01/20/2024 0916   GLUCOSE 102 (H) 01/20/2024 0916   GLUCOSE 133 (H) 09/30/2023 0742   BUN 23 01/20/2024 0916   CREATININE 0.78 01/20/2024 0916   CREATININE 0.85 09/30/2023 0742   CALCIUM 10.1 01/20/2024  9083   GFRNONAA >60 09/30/2023 0742   GFRAA >60 04/29/2018 2023   Lab Results  Component Value Date   HGBA1C 6.3 (H) 01/20/2024   HGBA1C 4.8 09/13/2021   Lab Results  Component Value Date   INSULIN  38.1 (H) 01/20/2024   Lab Results  Component Value Date   TSH 1.210 01/20/2024   CBC    Component Value Date/Time   WBC 6.9 01/20/2024 0916   WBC 6.1 09/30/2023 0742   WBC 5.3 02/24/2022 1117   RBC 4.53 01/20/2024 0916   RBC 4.35 09/30/2023 0742   HGB 13.0 01/20/2024 0916   HCT 40.3 01/20/2024 0916   PLT 364 01/20/2024 0916   MCV 89 01/20/2024 0916   MCH 28.7 01/20/2024 0916    MCH 29.0 09/30/2023 0742   MCHC 32.3 01/20/2024 0916   MCHC 32.7 09/30/2023 0742   RDW 13.3 01/20/2024 0916   Iron Studies No results found for: IRON, TIBC, FERRITIN, IRONPCTSAT Lipid Panel     Component Value Date/Time   CHOL 168 01/20/2024 0916   TRIG 96 01/20/2024 0916   HDL 46 01/20/2024 0916   LDLCALC 104 (H) 01/20/2024 0916   Hepatic Function Panel     Component Value Date/Time   PROT 7.2 01/20/2024 0916   ALBUMIN 4.6 01/20/2024 0916   AST 20 01/20/2024 0916   AST 16 09/30/2023 0742   ALT 22 01/20/2024 0916   ALT 20 09/30/2023 0742   ALKPHOS 85 01/20/2024 0916   BILITOT 0.4 01/20/2024 0916   BILITOT 0.6 09/30/2023 0742      Component Value Date/Time   TSH 1.210 01/20/2024 0916   Nutritional Lab Results  Component Value Date   VD25OH 82.9 01/20/2024     Assessment and Plan Assessment & Plan Obesity She has achieved a six-pound weight loss over the last two weeks with 75% adherence to a category two eating plan and engaging in chair aerobics for 60 minutes twice a week. The eating plan is designed to support weight loss and improve metabolic health by balancing protein and carbohydrate intake. - Continue category two eating plan - Continue chair aerobics 60 minutes twice a week - Focus on meal planning and preparation  Prediabetes Her fasting glucose has improved to 102 mg/dL from 866 mg/dL, but her hemoglobin J8r is 6.3%, indicating prediabetes. Emphasis is placed on preventing progression to diabetes through dietary management and exercise. Increasing protein intake and managing carbohydrate consumption are advised to reduce insulin  demand and support weight loss. Metformin  is recommended to aid in managing prediabetes and weight loss, with the understanding that it can be discontinued if no longer needed. The medication is expected to enhance weight loss when combined with dietary changes, contributing 20% to the overall benefit, while the eating plan  contributes 80%. Potential side effects include queasiness if taken on an empty stomach and diarrhea if dietary guidelines are not followed. 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 A single cystic lesion is generally not concerning and is unlikely to affect pancreatic function significantly. - Continue working on diet and weight loss and follow up with her GI doctor. Will monitor her progress and adjust her treatment plan if required based on new information  Vitamin D  supplementation Her vitamin D  level is 82.9 ng/mL, approaching  over-replacement. Reducing the frequency of vitamin D  supplementation is advised to prevent potential adverse effects of excessive vitamin D  levels, such as tissue deposition. - Reduce vitamin D  supplementation to 50,000 IU every two weeks  General Health Maintenance The importance of protein intake and managing carbohydrate consumption for overall health and weight management is emphasized. - Maintain adequate hydration - Focus on protein intake and manage carbohydrate consumption  Follow-up She is scheduled for a follow-up appointment to monitor progress and adjust the treatment plan as needed. - Schedule follow-up appointment three weeks after August 6     I have personally spent 44 minutes total time today in preparation, patient care, and documentation for this visit, including the following: review of clinical lab tests; review of medical history, review of new prediabetes diagnoses and extensive nutritional counseling factoring in all of her current medical conditions   She was informed of the importance of frequent follow up visits to maximize her success with intensive lifestyle modifications for her multiple health conditions.     Louann Penton, MD

## 2024-02-06 NOTE — Progress Notes (Signed)
  Subjective:  Patient ID: Joanna Osborn, female    DOB: [birth year],  MRN: [id number]  Chief Complaint  Patient presents with   Foot Pain    RM 7 Patient is here for bilateral plantar fasciitis. Patient has treated pain with heel inserts, stretch exercise and previous cortisone shots.   Still doing well never did do therapy but has improved overall.  She did note some return of her pain recently but not as bad as when it originally started     Objective:    Physical Exam   MUSCULOSKELETAL: Plantar fasciitis pain has improved overall but still some tenderness on the medial insertion.  Peroneal tendinitis and tinea pedis resolved      No images are attached to the encounter.     RADIOLOGY Foot X-rays: No acute fracture or stress fracture. Large plantar and posterior calcaneal spurring. (07/08/2023)      Assessment:   Encounter Diagnoses  Name Primary?   Plantar fasciitis of right foot Yes   Plantar fasciitis of left foot         Plan:  Patient was evaluated and treated and all questions answered.  Assessment and Plan  Repeat injection performed today.  Continue home PT and shoe gear modifications.  Follow-up as needed.  After sterile prep with povidone-iodine solution and alcohol, the bilateral heel was injected with 0.5cc 2% xylocaine  plain, 0.5cc 0.5% marcaine  plain, 20 mg triamcinolone  acetonide, and 4 mg dexamethasone  was injected along the medial plantar fascia at the insertion on the plantar calcaneus. The patient tolerated the procedure well without complication.

## 2024-02-11 ENCOUNTER — Encounter: Payer: Self-pay | Admitting: Advanced Practice Midwife

## 2024-02-24 ENCOUNTER — Ambulatory Visit (INDEPENDENT_AMBULATORY_CARE_PROVIDER_SITE_OTHER): Admitting: Family Medicine

## 2024-02-24 DIAGNOSIS — F4323 Adjustment disorder with mixed anxiety and depressed mood: Secondary | ICD-10-CM | POA: Diagnosis not present

## 2024-02-27 ENCOUNTER — Other Ambulatory Visit (INDEPENDENT_AMBULATORY_CARE_PROVIDER_SITE_OTHER): Payer: Self-pay | Admitting: Family Medicine

## 2024-02-27 DIAGNOSIS — R7303 Prediabetes: Secondary | ICD-10-CM

## 2024-03-01 ENCOUNTER — Ambulatory Visit (INDEPENDENT_AMBULATORY_CARE_PROVIDER_SITE_OTHER): Admitting: Family Medicine

## 2024-03-01 ENCOUNTER — Encounter (INDEPENDENT_AMBULATORY_CARE_PROVIDER_SITE_OTHER): Payer: Self-pay | Admitting: Family Medicine

## 2024-03-01 VITALS — BP 130/84 | HR 93 | Temp 98.7°F | Ht 65.0 in | Wt 221.0 lb

## 2024-03-01 DIAGNOSIS — E559 Vitamin D deficiency, unspecified: Secondary | ICD-10-CM

## 2024-03-01 DIAGNOSIS — R7303 Prediabetes: Secondary | ICD-10-CM

## 2024-03-01 DIAGNOSIS — E669 Obesity, unspecified: Secondary | ICD-10-CM

## 2024-03-01 DIAGNOSIS — Z6836 Body mass index (BMI) 36.0-36.9, adult: Secondary | ICD-10-CM | POA: Diagnosis not present

## 2024-03-01 MED ORDER — METFORMIN HCL 500 MG PO TABS: 500.0000 mg | ORAL_TABLET | Freq: Two times a day (BID) | ORAL | 0 refills | Status: DC

## 2024-03-01 NOTE — Progress Notes (Signed)
 Office: [phone]  /  Fax: [phone]  WEIGHT SUMMARY AND BIOMETRICS  Anthropometric Measurements Height: 5' 5 (1.651 m) Weight: 221 lb (100.2 kg) BMI (Calculated): 36.78 Weight at Last Visit: 224lb Weight Lost Since Last Visit: 3lb Weight Gained Since Last Visit: 0lb Starting Weight: 230lb Total Weight Loss (lbs): 9 lb (4.082 kg) Peak Weight: 230lb   Body Composition  Body Fat %: 44.1 % Fat Mass (lbs): 97.8 lbs Muscle Mass (lbs): 117.6 lbs Total Body Water (lbs): 81.8 lbs Visceral Fat Rating : 12   Other Clinical Data RMR: 1814 Fasting: No Labs: No Today's Visit #: 3 Starting Date: 01/20/24 Comments: third visit    Chief Complaint: OBESITY   History of Present Illness Joanna Osborn is a 53 year old female with obesity, prediabetes, and vitamin D  deficiency who presents for obesity treatment plan assessment and progress evaluation.  She is adhering to the category two eating plan approximately sixty percent of the time and engages in physical activity for sixty minutes one day a week. Over the past three to four weeks, she has achieved a weight loss of three pounds. She encounters difficulties in maintaining her diet post-vacation due to lack of preparation and grocery shopping. She finds microwavable meals, mal[place] burgers, and frozen vegetables beneficial in managing her diet. Her hunger levels fluctuate, being more pronounced on unstructured days, and she recognizes the need for better snack options to manage hunger.  Her vitamin D  deficiency is managed with vitamin D  50,000 IU every two weeks. She currently has an adequate supply of vitamin D .  Her prediabetes is managed with metformin  500 mg daily, and she requested a refill. She experiences no gastrointestinal issues with metformin  and takes it at the beginning of her day. Hunger issues are more problematic in the second half of the day.      PHYSICAL EXAM:  Blood pressure 130/84, pulse 93, temperature  98.7 F (37.1 C), height 5' 5 (1.651 m), weight 221 lb (100.2 kg), SpO2 100%. Body mass index is 36.78 kg/m.  DIAGNOSTIC DATA REVIEWED:  BMET    Component Value Date/Time   NA 143 01/20/2024 0916   K 4.2 01/20/2024 0916   CL 104 01/20/2024 0916   CO2 20 01/20/2024 0916   GLUCOSE 102 (H) 01/20/2024 0916   GLUCOSE 133 (H) 09/30/2023 0742   BUN 23 01/20/2024 0916   CREATININE 0.78 01/20/2024 0916   CREATININE 0.85 09/30/2023 0742   CALCIUM 10.1 01/20/2024 0916   GFRNONAA >60 09/30/2023 0742   GFRAA >60 04/29/2018 2023   Lab Results  Component Value Date   HGBA1C 6.3 (H) 01/20/2024   HGBA1C 4.8 09/13/2021   Lab Results  Component Value Date   INSULIN  38.1 (H) 01/20/2024   Lab Results  Component Value Date   TSH 1.210 01/20/2024   CBC    Component Value Date/Time   WBC 6.9 01/20/2024 0916   WBC 6.1 09/30/2023 0742   WBC 5.3 02/24/2022 1117   RBC 4.53 01/20/2024 0916   RBC 4.35 09/30/2023 0742   HGB 13.0 01/20/2024 0916   HCT 40.3 01/20/2024 0916   PLT 364 01/20/2024 0916   MCV 89 01/20/2024 0916   MCH 28.7 01/20/2024 0916   MCH 29.0 09/30/2023 0742   MCHC 32.3 01/20/2024 0916   MCHC 32.7 09/30/2023 0742   RDW 13.3 01/20/2024 0916   Iron Studies No results found for: IRON, TIBC, FERRITIN, IRONPCTSAT Lipid Panel     Component Value Date/Time   CHOL 168  01/20/2024 0916   TRIG 96 01/20/2024 0916   HDL 46 01/20/2024 0916   LDLCALC 104 (H) 01/20/2024 0916   Hepatic Function Panel     Component Value Date/Time   PROT 7.2 01/20/2024 0916   ALBUMIN 4.6 01/20/2024 0916   AST 20 01/20/2024 0916   AST 16 09/30/2023 0742   ALT 22 01/20/2024 0916   ALT 20 09/30/2023 0742   ALKPHOS 85 01/20/2024 0916   BILITOT 0.4 01/20/2024 0916   BILITOT 0.6 09/30/2023 0742      Component Value Date/Time   TSH 1.210 01/20/2024 0916   Nutritional Lab Results  Component Value Date   VD25OH 82.9 01/20/2024     Assessment and Plan Assessment &  Plan Obesity Obesity management is ongoing with a focus on dietary modifications and exercise. She is following the category two eating plan about 60% of the time and exercises 60 minutes once a week. She has lost three pounds over the last three to four weeks. Challenges include maintaining dietary habits post-vacation and managing hunger, particularly on unstructured days. - Continue category two eating plan. - Incorporate protein snacks such as [place] jerky, light Babybel cheeses, hard-boiled eggs, and light Greek yogurt to manage hunger. - Maintain muscle mass through adequate protein intake and exercise. - Develop sustainable exercise routine for long-term maintenance. - Strategies discussed include having microwavable meals, mal[place] burgers, and frozen vegetables available post-vacation to prevent dietary lapses.  Prediabetes Prediabetes is being managed with metformin  500 mg daily. She reports no gastrointestinal side effects. Hunger issues are more prominent in the second half of the day, suggesting the need for a second dose of metformin  to provide coverage throughout the day. - Increase metformin  to 500 mg twice daily. - Adjust timing of second dose based on convenience and effectiveness.  Vitamin D  deficiency Vitamin D  deficiency is being treated with vitamin D  50,000 IU every two weeks. She reports having an adequate supply and does not require a refill at this time. - Continue vitamin D  50,000 IU every two weeks.    She was informed of the importance of frequent follow up visits to maximize her success with intensive lifestyle modifications for her multiple health conditions.    Louann Penton, MD

## 2024-03-03 ENCOUNTER — Other Ambulatory Visit: Payer: Self-pay | Admitting: Hematology

## 2024-03-18 ENCOUNTER — Other Ambulatory Visit: Payer: Self-pay | Admitting: Podiatry

## 2024-03-23 ENCOUNTER — Ambulatory Visit (INDEPENDENT_AMBULATORY_CARE_PROVIDER_SITE_OTHER): Admitting: Family Medicine

## 2024-03-23 ENCOUNTER — Encounter (INDEPENDENT_AMBULATORY_CARE_PROVIDER_SITE_OTHER): Payer: Self-pay | Admitting: Family Medicine

## 2024-03-23 VITALS — BP 113/78 | HR 85 | Temp 98.1°F | Ht 65.0 in | Wt 217.0 lb

## 2024-03-23 DIAGNOSIS — E669 Obesity, unspecified: Secondary | ICD-10-CM

## 2024-03-23 DIAGNOSIS — F5089 Other specified eating disorder: Secondary | ICD-10-CM | POA: Diagnosis not present

## 2024-03-23 DIAGNOSIS — R7303 Prediabetes: Secondary | ICD-10-CM

## 2024-03-23 DIAGNOSIS — Z6836 Body mass index (BMI) 36.0-36.9, adult: Secondary | ICD-10-CM

## 2024-03-23 DIAGNOSIS — E559 Vitamin D deficiency, unspecified: Secondary | ICD-10-CM | POA: Diagnosis not present

## 2024-03-23 DIAGNOSIS — F3289 Other specified depressive episodes: Secondary | ICD-10-CM

## 2024-03-23 MED ORDER — VITAMIN D (ERGOCALCIFEROL) 1.25 MG (50000 UNIT) PO CAPS: ORAL_CAPSULE | ORAL | 0 refills | Status: DC

## 2024-03-23 MED ORDER — METFORMIN HCL 500 MG PO TABS: 500.0000 mg | ORAL_TABLET | Freq: Two times a day (BID) | ORAL | 0 refills | Status: DC

## 2024-03-23 NOTE — Progress Notes (Signed)
 Office: [phone]  /  Fax: [phone]  WEIGHT SUMMARY AND BIOMETRICS  Anthropometric Measurements Height: 5' 5 (1.651 m) Weight: 217 lb (98.4 kg) BMI (Calculated): 36.11 Weight at Last Visit: 221 lb Weight Lost Since Last Visit: 4 lb Weight Gained Since Last Visit: 0 Starting Weight: 230 lb Total Weight Loss (lbs): 13 lb (5.897 kg) Peak Weight: 230 lb   Body Composition  Body Fat %: 43.3 % Fat Mass (lbs): 94.2 lbs Muscle Mass (lbs): 117 lbs Total Body Water (lbs): 78.4 lbs Visceral Fat Rating : 12   Other Clinical Data RMR: 1814 Fasting: no Labs: no Today's Visit #: 4 Starting Date: 01/20/24    Chief Complaint: OBESITY    History of Present Illness Joanna Osborn is a 53 year old female with obesity who presents for obesity treatment and progress assessment.  She has been following the category two eating plan about sixty percent of the time, focusing on lean proteins and whole foods such as fruits and vegetables. She struggles with consistent hydration, achieving seven to nine hours of sleep, and occasionally skips meals. She is not currently engaging in regular exercise.  Over the past three weeks, she has lost four pounds. Meal planning and preparation have been beneficial, but she faces challenges on days without prior planning. Typically, she prepares meals on Monday and Tuesday, ensuring options like mal[place] burgers are available. On days without prepared meals, she opts for salads with added protein such as chicken.  She experiences challenges with sweet cravings, particularly after meals, which she attributes to a long-standing habit. These cravings are more pronounced in the second half of the day. She manages these cravings by portion control, such as having a small serving of ice cream with wet walnuts.      PHYSICAL EXAM:  Blood pressure 113/78, pulse 85, temperature 98.1 F (36.7 C), height 5' 5 (1.651 m), weight 217 lb (98.4 kg), SpO2  97%. Body mass index is 36.11 kg/m.  DIAGNOSTIC DATA REVIEWED:  BMET    Component Value Date/Time   NA 143 01/20/2024 0916   K 4.2 01/20/2024 0916   CL 104 01/20/2024 0916   CO2 20 01/20/2024 0916   GLUCOSE 102 (H) 01/20/2024 0916   GLUCOSE 133 (H) 09/30/2023 0742   BUN 23 01/20/2024 0916   CREATININE 0.78 01/20/2024 0916   CREATININE 0.85 09/30/2023 0742   CALCIUM 10.1 01/20/2024 0916   GFRNONAA >60 09/30/2023 0742   GFRAA >60 04/29/2018 2023   Lab Results  Component Value Date   HGBA1C 6.3 (H) 01/20/2024   HGBA1C 4.8 09/13/2021   Lab Results  Component Value Date   INSULIN  38.1 (H) 01/20/2024   Lab Results  Component Value Date   TSH 1.210 01/20/2024   CBC    Component Value Date/Time   WBC 6.9 01/20/2024 0916   WBC 6.1 09/30/2023 0742   WBC 5.3 02/24/2022 1117   RBC 4.53 01/20/2024 0916   RBC 4.35 09/30/2023 0742   HGB 13.0 01/20/2024 0916   HCT 40.3 01/20/2024 0916   PLT 364 01/20/2024 0916   MCV 89 01/20/2024 0916   MCH 28.7 01/20/2024 0916   MCH 29.0 09/30/2023 0742   MCHC 32.3 01/20/2024 0916   MCHC 32.7 09/30/2023 0742   RDW 13.3 01/20/2024 0916   Iron Studies No results found for: IRON, TIBC, FERRITIN, IRONPCTSAT Lipid Panel     Component Value Date/Time   CHOL 168 01/20/2024 0916   TRIG 96 01/20/2024 0916   HDL  46 01/20/2024 0916   LDLCALC 104 (H) 01/20/2024 0916   Hepatic Function Panel     Component Value Date/Time   PROT 7.2 01/20/2024 0916   ALBUMIN 4.6 01/20/2024 0916   AST 20 01/20/2024 0916   AST 16 09/30/2023 0742   ALT 22 01/20/2024 0916   ALT 20 09/30/2023 0742   ALKPHOS 85 01/20/2024 0916   BILITOT 0.4 01/20/2024 0916   BILITOT 0.6 09/30/2023 0742      Component Value Date/Time   TSH 1.210 01/20/2024 0916   Nutritional Lab Results  Component Value Date   VD25OH 82.9 01/20/2024     Assessment and Plan Assessment & Plan Obesity Obesity management is ongoing with a weight loss of four pounds in the  last three weeks, indicating progress. She follows the category two eating plan about 60% of the time, incorporating lean proteins and whole foods. Challenges include lack of exercise, skipping meals, and difficulties in meal planning and preparation. - Continue category two eating plan - Encourage meal planning and preparation, strategies discussed today  Prediabetes Prediabetes is managed alongside obesity treatment with the category two eating plan, focusing on whole foods and lean proteins. - Continue category two eating plan for prediabetes management - Will work on increasing exercise in the near future  Vitamin D  deficiency Vitamin D  deficiency is managed with supplementation. - Refill vitamin D  prescription at [facility] on [address]  Emotional eating behaviors Emotional eating behaviors present challenges, particularly cravings for sweets post-meals. Strategies discussed include a protein pudding recipe for low-calorie, high-protein sweet cravings, boosting neurotransmitter levels through enjoyable activities, and the importance of adequate sleep. - Provide protein pudding recipe to manage sweet cravings - Encourage engaging in enjoyable activities to boost neurotransmitter levels - Discuss importance of adequate sleep to manage cravings    She was informed of the importance of frequent follow up visits to maximize her success with intensive lifestyle modifications for her multiple health conditions.    Louann Penton, MD

## 2024-03-30 DIAGNOSIS — F4323 Adjustment disorder with mixed anxiety and depressed mood: Secondary | ICD-10-CM | POA: Diagnosis not present

## 2024-04-03 ENCOUNTER — Other Ambulatory Visit

## 2024-04-03 ENCOUNTER — Ambulatory Visit: Attending: General Surgery

## 2024-04-03 ENCOUNTER — Ambulatory Visit: Admitting: Nurse Practitioner

## 2024-04-03 VITALS — Wt 223.5 lb

## 2024-04-03 DIAGNOSIS — Z483 Aftercare following surgery for neoplasm: Secondary | ICD-10-CM | POA: Insufficient documentation

## 2024-04-03 NOTE — Therapy (Signed)
 OUTPATIENT PHYSICAL THERAPY SOZO SCREENING NOTE   Patient Name: Joanna Osborn MRN: [id number] DOB:18-Sep-1970, 53 y.o., female Today's Date: 04/03/2024  PCP: Alvera Reagin, PA REFERRING PROVIDER: Ebbie Cough, MD   PT End of Session - 04/03/24 1047     Visit Number 7   # unchanged due to screen only   PT Start Time 1046    PT Stop Time 1050    PT Time Calculation (min) 4 min    Activity Tolerance Patient tolerated treatment well    Behavior During Therapy [facility] for tasks assessed/performed           Past Medical History:  Diagnosis Date   Anemia    Anxiety    Chemotherapy-induced peripheral neuropathy (HCC)    per pt toes   Depression    Family history of colon cancer 03/19/2021   Fatty liver    GERD (gastroesophageal reflux disease)    High blood pressure    High cholesterol    History of chemotherapy    left breast cancer  04-04-2021  to 07-26-2021 and restarted 08-14-2021   History of external beam radiation therapy    left breast cancer  10-02-2021  to 11-18-2021   History of kidney stones    Hypertension    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   Malignant neoplasm of upper-outer quadrant of left breast in female, 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   oncologist--- dr lanny;  Stage IB, IDC, ER/ PR/ HER2 +;   chemo 04-04-2021  to 07-26-2021 and started 08-14-2021;  raditation completed 11-18-2021   Port-A-Cath in place 04/02/2021   Prediabetes    Wears glasses    Past Surgical History:  Procedure Laterality Date   BIOPSY  02/19/2022   Procedure: BIOPSY;  Surgeon: Wilhelmenia Aloha Raddle., MD;  Location: [facility] ENDOSCOPY;  Service: Gastroenterology;;   BREAST LUMPECTOMY WITH RADIOACTIVE SEED AND SENTINEL LYMPH NODE BIOPSY Left 08/25/2021   Procedure: LEFT BREAST LUMPECTOMY WITH RADIOACTIVE SEED AND AXILLARY SENTINEL LYMPH NODE BIOPSY;  Surgeon: Ebbie Cough, MD;  Location: [facility];  Service: General;  Laterality: Left;   CARPAL TUNNEL  RELEASE Right 08/17/2008   @[facility]  by dr murrell;   and excision wrist ganglian cyst   CHOLECYSTECTOMY, LAPAROSCOPIC  06/27/2002   @[facility]  by dr adel   COLONOSCOPY WITH PROPOFOL  N/A 03/15/2023   Procedure: COLONOSCOPY WITH PROPOFOL ;  Surgeon: Wilhelmenia Aloha Raddle., MD;  Location: [facility];  Service: Gastroenterology;  Laterality: N/A;   ESOPHAGOGASTRODUODENOSCOPY (EGD) WITH PROPOFOL  N/A 02/19/2022   Procedure: ESOPHAGOGASTRODUODENOSCOPY (EGD) WITH PROPOFOL ;  Surgeon: Wilhelmenia Aloha Raddle., MD;  Location: [facility] ENDOSCOPY;  Service: Gastroenterology;  Laterality: N/A;   ESOPHAGOGASTRODUODENOSCOPY (EGD) WITH PROPOFOL  N/A 07/13/2022   Procedure: ESOPHAGOGASTRODUODENOSCOPY (EGD) WITH PROPOFOL ;  Surgeon: Wilhelmenia Aloha Raddle., MD;  Location: [facility] ENDOSCOPY;  Service: Gastroenterology;  Laterality: N/A;   EUS N/A 02/19/2022   Procedure: UPPER ENDOSCOPIC ULTRASOUND (EUS) RADIAL;  Surgeon: Wilhelmenia Aloha Raddle., MD;  Location: [facility] ENDOSCOPY;  Service: Gastroenterology;  Laterality: N/A;   FINE NEEDLE ASPIRATION  02/19/2022   Procedure: FINE NEEDLE ASPIRATION (FNA) LINEAR;  Surgeon: Wilhelmenia Aloha Raddle., MD;  Location: [facility] ENDOSCOPY;  Service: Gastroenterology;;   HYSTEROSCOPY WITH D & C  11/21/2007   @[facility]  by dr latisha;  polypectomy   INSERTION OF MESH N/A 10/28/2016   Procedure: INSERTION OF MESH;  Surgeon: Mitzie DELENA Freund, MD;  Location: [facility] OR;  Service: General;  Laterality: N/A;   LAPAROSCOPIC SALPINGO OOPHERECTOMY Bilateral 02/02/2022   Procedure: LAPAROSCOPIC BILATERAL SALPINGO  OOPHORECTOMY;  Surgeon: Johnnye Ade, MD;  Location: [facility];  Service: Gynecology;  Laterality: Bilateral;   OOPHORECTOMY     PORT-A-CATH REMOVAL Right 08/24/2022   Procedure: REMOVAL PORT-A-CATH;  Surgeon: Ebbie Cough, MD;  Location: [facility];  Service: General;  Laterality: Right;   PORTACATH PLACEMENT Right 04/02/2021   Procedure: INSERTION PORT-A-CATH;  Surgeon:  Ebbie Cough, MD;  Location: [facility];  Service: General;  Laterality: Right;   TUBAL LIGATION  1999   VENTRAL HERNIA REPAIR N/A 10/28/2016   Procedure: LAPAROSCOPIC VENTRAL HERNIA REPAIR;  Surgeon: Mitzie DELENA Freund, MD;  Location: [facility];  Service: General;  Laterality: N/A;   Patient Active Problem List   Diagnosis Date Noted   Class 2 severe obesity with serious comorbidity and body mass index (BMI) of 38.0 to 38.9 in adult (HCC) 02/03/2024   BMI 36.0-36.9,adult 02/03/2024   Vitamin D  deficiency 02/03/2024   Prediabetes 02/03/2024   Left breast abscess 09/12/2021   Essential hypertension 09/12/2021   Lesion of pancreas 09/12/2021   Hypokalemia 09/12/2021   Genetic testing 04/08/2021   Port-A-Cath in place 04/03/2021   Family history of colon cancer 03/19/2021   Malignant neoplasm of upper-outer quadrant of left breast in female, 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   Suicide ideation 07/07/2013   Major depressive disorder, recurrent severe without psychotic features (HCC) 07/06/2013    REFERRING DIAG: left breast cancer at risk for lymphedema  THERAPY DIAG: Aftercare following surgery for neoplasm  PERTINENT HISTORY: Patient was diagnosed on 03/05/2021 with left grade III invasive ductal carcinoma breast cancer. She underwent neoadjuvant chemotherapy from 04/03/2021-07/24/2021. She had a left lumpectomy and sentinel node biopsy (2 negative nodes) on 08/25/2021. It is triple positive with a Ki67 of 40%.   PRECAUTIONS: left UE Lymphedema risk, None  SUBJECTIVE: Pt returns for her first 6 month L-Dex screen.   PAIN:  Are you having pain? No  SOZO SCREENING: Patient was assessed today using the SOZO machine to determine the lymphedema index score. This was compared to her baseline score. It was determined that she is within the recommended range when compared to her baseline and no further action is needed at this time. She will continue SOZO screenings.  These are done every 3 months for 2 years post operatively followed by every 6 months for 2 years, and then annually.  Did encourage pt to cont wear of compression sleeve during periods of highly repetitive activities, when increasing her HR (I.e. walking) and/or flying. Pt verbalized good understanding and was agreeable to this.     L-DEX FLOWSHEETS - 04/03/24 1000       L-DEX LYMPHEDEMA SCREENING   Measurement Type Unilateral    L-DEX MEASUREMENT EXTREMITY Upper Extremity    POSITION  Standing    DOMINANT SIDE Right    At Risk Side Left    BASELINE SCORE (UNILATERAL) -2.7    L-DEX SCORE (UNILATERAL) -5    VALUE CHANGE (UNILAT) -2.3          P: Cont 6 month L-Dex screens next.  Aden Berwyn Caldron, PTA 04/03/2024, 10:48 AM

## 2024-04-19 ENCOUNTER — Encounter (INDEPENDENT_AMBULATORY_CARE_PROVIDER_SITE_OTHER): Payer: Self-pay | Admitting: Family Medicine

## 2024-04-19 ENCOUNTER — Ambulatory Visit (INDEPENDENT_AMBULATORY_CARE_PROVIDER_SITE_OTHER): Admitting: Family Medicine

## 2024-04-19 VITALS — BP 107/74 | HR 89 | Temp 97.9°F | Ht 65.0 in | Wt 216.0 lb

## 2024-04-19 DIAGNOSIS — E559 Vitamin D deficiency, unspecified: Secondary | ICD-10-CM

## 2024-04-19 DIAGNOSIS — F32A Depression, unspecified: Secondary | ICD-10-CM

## 2024-04-19 DIAGNOSIS — Z6835 Body mass index (BMI) 35.0-35.9, adult: Secondary | ICD-10-CM

## 2024-04-19 DIAGNOSIS — F5089 Other specified eating disorder: Secondary | ICD-10-CM

## 2024-04-19 DIAGNOSIS — K219 Gastro-esophageal reflux disease without esophagitis: Secondary | ICD-10-CM

## 2024-04-19 DIAGNOSIS — Z6836 Body mass index (BMI) 36.0-36.9, adult: Secondary | ICD-10-CM

## 2024-04-19 DIAGNOSIS — F419 Anxiety disorder, unspecified: Secondary | ICD-10-CM

## 2024-04-19 DIAGNOSIS — E66812 Obesity, class 2: Secondary | ICD-10-CM

## 2024-04-19 DIAGNOSIS — R7303 Prediabetes: Secondary | ICD-10-CM

## 2024-04-19 MED ORDER — METFORMIN HCL 500 MG PO TABS: 500.0000 mg | ORAL_TABLET | Freq: Two times a day (BID) | ORAL | 0 refills | Status: DC

## 2024-04-19 MED ORDER — OMEPRAZOLE 40 MG PO CPDR: 40.0000 mg | DELAYED_RELEASE_CAPSULE | Freq: Every day | ORAL | 0 refills | Status: DC

## 2024-04-19 MED ORDER — BUPROPION HCL ER (SR) 150 MG PO TB12: 150.0000 mg | ORAL_TABLET | Freq: Every day | ORAL | 0 refills | Status: DC

## 2024-04-19 MED ORDER — VITAMIN D (ERGOCALCIFEROL) 1.25 MG (50000 UNIT) PO CAPS
ORAL_CAPSULE | ORAL | 0 refills | Status: DC
Start: 2024-04-19 — End: 2024-05-10

## 2024-04-20 ENCOUNTER — Other Ambulatory Visit (INDEPENDENT_AMBULATORY_CARE_PROVIDER_SITE_OTHER): Payer: Self-pay | Admitting: Family Medicine

## 2024-04-20 DIAGNOSIS — F32A Depression, unspecified: Secondary | ICD-10-CM | POA: Insufficient documentation

## 2024-04-20 DIAGNOSIS — E559 Vitamin D deficiency, unspecified: Secondary | ICD-10-CM

## 2024-04-20 NOTE — Progress Notes (Signed)
 Office: [phone]  /  Fax: [phone]  WEIGHT SUMMARY AND BIOMETRICS  Anthropometric Measurements Height: 5' 5 (1.651 m) Weight: 216 lb (98 kg) BMI (Calculated): 35.94 Weight at Last Visit: 217 lb Weight Lost Since Last Visit: 1 lb Weight Gained Since Last Visit: 0 Starting Weight: 230 lb Total Weight Loss (lbs): 14 lb (6.35 kg) Peak Weight: 230 lb   Body Composition  Body Fat %: 42.4 % Fat Mass (lbs): 91.8 lbs Muscle Mass (lbs): 118.4 lbs Total Body Water (lbs): 80.2 lbs Visceral Fat Rating : 11   Other Clinical Data RMR: 1814 Fasting: no Labs: no Today's Visit #: 5 Starting Date: 01/20/24    Chief Complaint: OBESITY   History of Present Illness Joanna Osborn is a 53 year old female with obesity and prediabetes who presents for obesity treatment and progress assessment.  She is adhering to the prescribed category two eating plan only 30% of the time and has not yet initiated an exercise regimen. Despite these challenges, she has managed to lose one pound over the last month. Her depression and anxiety have been significantly impacting her ability to follow the eating plan, as she uses eating as a coping mechanism. She struggles to perceive her weight loss as her clothes feel unchanged.  In addition to obesity, she is managing her prediabetes primarily through diet, exercise, and weight loss, and is currently taking metformin  twice daily. She generally takes the second dose most days, although her eating patterns are inconsistent, with some days of no appetite and others of excessive eating. She is attempting to make healthier food choices, such as choosing grilled nuggets over fried foods, but occasionally consumes [place] fries, which she identifies as a weakness.  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 This stress further complicates her meal planning and  adherence to dietary goals.  She denies any issues with metformin  and reports that Prilosec is helping her GERD.      PHYSICAL EXAM:  Blood pressure 107/74, pulse 89, temperature 97.9 F (36.6 C), height 5' 5 (1.651 m), weight 216 lb (98 kg), SpO2 97%. Body mass index is 35.94 kg/m.  DIAGNOSTIC DATA REVIEWED:  BMET    Component Value Date/Time   NA 143 01/20/2024 0916   K 4.2 01/20/2024 0916   CL 104 01/20/2024 0916   CO2 20 01/20/2024 0916   GLUCOSE 102 (H) 01/20/2024 0916   GLUCOSE 133 (H) 09/30/2023 0742   BUN 23 01/20/2024 0916   CREATININE 0.78 01/20/2024 0916   CREATININE 0.85 09/30/2023 0742   CALCIUM 10.1 01/20/2024 0916   GFRNONAA >60 09/30/2023 0742   GFRAA >60 04/29/2018 2023   Lab Results  Component Value Date   HGBA1C 6.3 (H) 01/20/2024   HGBA1C 4.8 09/13/2021   Lab Results  Component Value Date   INSULIN  38.1 (H) 01/20/2024   Lab Results  Component Value Date   TSH 1.210 01/20/2024   CBC    Component Value Date/Time   WBC 6.9 01/20/2024 0916   WBC 6.1 09/30/2023 0742   WBC 5.3 02/24/2022 1117   RBC 4.53 01/20/2024 0916   RBC 4.35 09/30/2023 0742   HGB 13.0 01/20/2024 0916   HCT 40.3 01/20/2024 0916   PLT 364 01/20/2024 0916   MCV 89 01/20/2024 0916   MCH 28.7 01/20/2024 0916   MCH 29.0 09/30/2023 0742   MCHC 32.3 01/20/2024 0916   MCHC 32.7 09/30/2023  0742   RDW 13.3 01/20/2024 0916   Iron Studies No results found for: IRON, TIBC, FERRITIN, IRONPCTSAT Lipid Panel     Component Value Date/Time   CHOL 168 01/20/2024 0916   TRIG 96 01/20/2024 0916   HDL 46 01/20/2024 0916   LDLCALC 104 (H) 01/20/2024 0916   Hepatic Function Panel     Component Value Date/Time   PROT 7.2 01/20/2024 0916   ALBUMIN 4.6 01/20/2024 0916   AST 20 01/20/2024 0916   AST 16 09/30/2023 0742   ALT 22 01/20/2024 0916   ALT 20 09/30/2023 0742   ALKPHOS 85 01/20/2024 0916   BILITOT 0.4 01/20/2024 0916   BILITOT 0.6 09/30/2023 0742       Component Value Date/Time   TSH 1.210 01/20/2024 0916   Nutritional Lab Results  Component Value Date   VD25OH 82.9 01/20/2024     Assessment and Plan Assessment & Plan Obesity, class 2 Obesity, class 2, with minimal weight loss of one pound over the last month. Adherence to the prescribed category two eating plan is low, with only 30% compliance. No exercise regimen has been initiated yet. Emotional eating due to stress and depression is a significant barrier to weight loss. - Provide easy meal planning strategies using slow cooker or Instapot recipes to meet protein goals while keeping calories low. - Encourage the use of leftovers for meal prep to maintain dietary goals. - Discuss the importance of consistent eating patterns to avoid triggering defense mechanisms that lead to weight gain.  Emotional eating behavior Emotional eating behavior exacerbated by stress and depression, leading to inconsistent dietary habits. Introduction of bupropion  as a treatment option to target emotional and stress eating, with additional mild antidepressant effects. Bupropion  is noted for its minimal side effects, primarily dry mouth, and is easy to discontinue when no longer needed. - Start bupropion  SR 150 mg in the morning to address emotional eating and provide mild antidepressant effects. - Monitor for side effects, particularly dry mouth, and adjust dosage if necessary.  Depression and anxiety Depression and anxiety contributing to emotional eating and inconsistent dietary habits. Seasonal changes and stress from work-related activities are identified as triggers. Bupropion  is chosen for its mild antidepressant effects and ability to help with emotional eating. - Start bupropion  SR 150 mg in the morning to provide mild antidepressant effects and address emotional eating.  Prediabetes Prediabetes managed primarily with diet, exercise, and weight loss. Currently on metformin  twice daily. Adherence  to medication is generally good, though inconsistent eating patterns are noted. - Refill metformin  prescription.  Gastroesophageal reflux disease (GERD) GERD is well-managed with current treatment. She reports running low on Prilosec and has not seen her prescribing doctor recently. - Refill Prilosec prescription to manage GERD symptoms.  Vitamin D  deficiency Vitamin D  deficiency is being managed with supplementation. No new issues reported. - Refill vitamin D  prescription.      Joanna Osborn was informed of the importance of frequent follow up visits to maximize her success with intensive lifestyle modifications for her obesity and obesity related health conditions as recommended by USPSTF and CMS guidelines   Louann Penton, MD

## 2024-05-10 ENCOUNTER — Ambulatory Visit (INDEPENDENT_AMBULATORY_CARE_PROVIDER_SITE_OTHER): Admitting: Family Medicine

## 2024-05-10 ENCOUNTER — Encounter (INDEPENDENT_AMBULATORY_CARE_PROVIDER_SITE_OTHER): Payer: Self-pay | Admitting: Family Medicine

## 2024-05-10 VITALS — BP 122/84 | HR 92 | Temp 98.0°F | Ht 65.0 in | Wt 218.0 lb

## 2024-05-10 DIAGNOSIS — R7303 Prediabetes: Secondary | ICD-10-CM | POA: Diagnosis not present

## 2024-05-10 DIAGNOSIS — E669 Obesity, unspecified: Secondary | ICD-10-CM | POA: Diagnosis not present

## 2024-05-10 DIAGNOSIS — Z6836 Body mass index (BMI) 36.0-36.9, adult: Secondary | ICD-10-CM

## 2024-05-10 DIAGNOSIS — F5089 Other specified eating disorder: Secondary | ICD-10-CM | POA: Diagnosis not present

## 2024-05-10 DIAGNOSIS — Z6838 Body mass index (BMI) 38.0-38.9, adult: Secondary | ICD-10-CM

## 2024-05-10 DIAGNOSIS — E559 Vitamin D deficiency, unspecified: Secondary | ICD-10-CM

## 2024-05-10 DIAGNOSIS — F3289 Other specified depressive episodes: Secondary | ICD-10-CM

## 2024-05-10 MED ORDER — BUPROPION HCL ER (SR) 150 MG PO TB12: 150.0000 mg | ORAL_TABLET | Freq: Every day | ORAL | 0 refills | Status: DC

## 2024-05-10 MED ORDER — VITAMIN D (ERGOCALCIFEROL) 1.25 MG (50000 UNIT) PO CAPS: ORAL_CAPSULE | ORAL | 0 refills | Status: DC

## 2024-05-10 MED ORDER — METFORMIN HCL 500 MG PO TABS: 500.0000 mg | ORAL_TABLET | Freq: Two times a day (BID) | ORAL | 0 refills | Status: DC

## 2024-05-10 NOTE — Progress Notes (Signed)
 Office: [phone]  /  Fax: [phone]  WEIGHT SUMMARY AND BIOMETRICS  Anthropometric Measurements Height: 5' 5 (1.651 m) Weight: 218 lb (98.9 kg) BMI (Calculated): 36.28 Weight at Last Visit: 216 lb Weight Lost Since Last Visit: 0 Weight Gained Since Last Visit: 2 lb Starting Weight: 230 lb Total Weight Loss (lbs): 12 lb (5.443 kg) Peak Weight: 230 lb   Body Composition  Body Fat %: 45 % Fat Mass (lbs): 98.2 lbs Muscle Mass (lbs): 114 lbs Total Body Water (lbs): 80.8 lbs Visceral Fat Rating : 12   Other Clinical Data RMR: [id number] Fasting: no Labs: no Today's Visit #: 6 Starting Date: 01/20/24    Chief Complaint: OBESITY   History of Present Illness Joanna Osborn is a 53 year old female with obesity and prediabetes who presents for obesity treatment and progress assessment.  She has been prescribed a category two eating plan but has struggled to adhere to it, particularly due to 'celebration eating' between her last visit and now. She has gained two pounds over the last three weeks and acknowledges eating 'all the cake and cookies' and drinking more alcohol than usual during celebrations, which included her birthday and her grandson's birthday. She often skipped meals, particularly breakfast, and found it challenging to plan meals correctly. She has not exercised during this period.  She reports a history of prediabetes and is currently taking metformin  500 mg twice a day. She is currently taking metformin  500 mg twice a day and is working on reducing simple carbohydrates in her diet. No gastrointestinal issues with metformin .  She is also taking Wellbutrin  for emotional eating and cravings, although she is unsure of its effectiveness due to recent lapses in meal planning and preparation. She finds breakfast particularly challenging, feeling like she is eating the same thing every day, which she does not enjoy.  She mentions a busy schedule with personal events and a  breast cancer fashion show, which has contributed to her difficulty in maintaining her eating plan and exercise routine. She plans to incorporate more physical activity, such as line dancing classes, as her schedule allows.      PHYSICAL EXAM:  Blood pressure 122/84, pulse 92, temperature 98 F (36.7 C), height 5' 5 (1.651 m), weight 218 lb (98.9 kg), SpO2 99%. Body mass index is 36.28 kg/m.  DIAGNOSTIC DATA REVIEWED:  BMET    Component Value Date/Time   NA 143 01/20/2024 0916   K 4.2 01/20/2024 0916   CL 104 01/20/2024 0916   CO2 20 01/20/2024 0916   GLUCOSE 102 (H) 01/20/2024 0916   GLUCOSE 133 (H) 09/30/2023 0742   BUN 23 01/20/2024 0916   CREATININE 0.78 01/20/2024 0916   CREATININE 0.85 09/30/2023 0742   CALCIUM 10.1 01/20/2024 0916   GFRNONAA >60 09/30/2023 0742   GFRAA >60 04/29/2018 2023   Lab Results  Component Value Date   HGBA1C 6.3 (H) 01/20/2024   HGBA1C 4.8 09/13/2021   Lab Results  Component Value Date   INSULIN  38.1 (H) 01/20/2024   Lab Results  Component Value Date   TSH 1.210 01/20/2024   CBC    Component Value Date/Time   WBC 6.9 01/20/2024 0916   WBC 6.1 09/30/2023 0742   WBC 5.3 02/24/2022 1117   RBC 4.53 01/20/2024 0916   RBC 4.35 09/30/2023 0742   HGB 13.0 01/20/2024 0916   HCT 40.3 01/20/2024 0916   PLT 364 01/20/2024 0916   MCV 89 01/20/2024 0916   MCH 28.7 01/20/2024  0916   MCH 29.0 09/30/2023 0742   MCHC 32.3 01/20/2024 0916   MCHC 32.7 09/30/2023 0742   RDW 13.3 01/20/2024 0916   Iron Studies No results found for: IRON, TIBC, FERRITIN, IRONPCTSAT Lipid Panel     Component Value Date/Time   CHOL 168 01/20/2024 0916   TRIG 96 01/20/2024 0916   HDL 46 01/20/2024 0916   LDLCALC 104 (H) 01/20/2024 0916   Hepatic Function Panel     Component Value Date/Time   PROT 7.2 01/20/2024 0916   ALBUMIN 4.6 01/20/2024 0916   AST 20 01/20/2024 0916   AST 16 09/30/2023 0742   ALT 22 01/20/2024 0916   ALT 20 09/30/2023  0742   ALKPHOS 85 01/20/2024 0916   BILITOT 0.4 01/20/2024 0916   BILITOT 0.6 09/30/2023 0742      Component Value Date/Time   TSH 1.210 01/20/2024 0916   Nutritional Lab Results  Component Value Date   VD25OH 82.9 01/20/2024     Assessment and Plan Assessment & Plan Obesity Recent weight gain of two pounds over the last three weeks due to increased celebratory eating and lack of exercise. She has not adhered to the category two eating plan. - Encourage adherence to category two eating plan. - Incorporate exercise, such as walking or line dancing, as feasible.  Prediabetes Prediabetes is managed with metformin  500 mg twice daily and dietary modifications to reduce simple carbohydrates. No gastrointestinal issues reported with metformin . - Continue metformin  500 mg twice daily. - Encourage reduction of simple carbohydrates in diet.  Vitamin D  deficiency Vitamin D  deficiency is managed with supplementation every other week. - Continue vitamin D  supplementation every other week.  Emotional eating behaviors Emotional eating behaviors are addressed with bupropion  for cravings and emotional eating. Discussed meal planning and identifying stress-related eating triggers. - Continue current bupropion  dosage. - Encourage meal planning and identification of stress-related eating triggers.  Follow-Up Follow-up appointments scheduled to monitor progress and adjust treatment as necessary. - Schedule fasting labs for November 12th at 10:40 AM.    Joanna Osborn was informed of the importance of frequent follow up visits to maximize her success with intensive lifestyle modifications for her obesity and obesity related health conditions as recommended by USPSTF and CMS guidelines   Louann Penton, MD

## 2024-05-22 ENCOUNTER — Other Ambulatory Visit: Payer: Self-pay | Admitting: Podiatry

## 2024-05-23 ENCOUNTER — Ambulatory Visit (HOSPITAL_COMMUNITY)
Admission: RE | Admit: 2024-05-23 | Discharge: 2024-05-23 | Disposition: A | Discharge status: Home / Self Care | Source: Ambulatory Visit | Attending: Hematology | Admitting: Hematology

## 2024-05-23 ENCOUNTER — Other Ambulatory Visit: Payer: Self-pay | Admitting: Hematology

## 2024-05-23 DIAGNOSIS — K869 Disease of pancreas, unspecified: Secondary | ICD-10-CM | POA: Insufficient documentation

## 2024-05-23 DIAGNOSIS — K862 Cyst of pancreas: Secondary | ICD-10-CM | POA: Diagnosis not present

## 2024-05-23 DIAGNOSIS — R160 Hepatomegaly, not elsewhere classified: Secondary | ICD-10-CM | POA: Diagnosis not present

## 2024-05-23 DIAGNOSIS — N281 Cyst of kidney, acquired: Secondary | ICD-10-CM | POA: Diagnosis not present

## 2024-05-23 DIAGNOSIS — D259 Leiomyoma of uterus, unspecified: Secondary | ICD-10-CM | POA: Diagnosis not present

## 2024-05-23 MED ORDER — GADOBUTROL 1 MMOL/ML IV SOLN
10.0000 mL | Freq: Once | INTRAVENOUS | Status: AC | PRN
  Administered 2024-05-23: 10 mL via INTRAVENOUS

## 2024-05-26 ENCOUNTER — Ambulatory Visit: Payer: Self-pay

## 2024-05-26 NOTE — Telephone Encounter (Signed)
-----   Message from [facility] sent at 05/26/2024  6:09 AM EDT ----- YF, I favor continued monitoring with her prior EUS findings and overall relative stability. I am also willing to repeat EUS. Let me have her come in and talk in clinic.  Joshawa Dubin, Schedule this patient with me for clinic visit (OK to overbook or use held slot as necessary or 350PM slot). Thanks. GM ----- Message ----- From: Lanny Callander, MD Sent: 05/25/2024   7:54 AM EDT To: Aloha Wilhelmenia Raddle., MD  Gabe,   When you have a chance, please review her recent MRI and let me know if you plan to offer EUS and biopsy. I am seeing her next week. Thx  Callander Lanny  ----- Message ----- From: Rebecka, Rad Results In Sent: 05/23/2024  10:45 AM EDT To: Callander Lanny, MD

## 2024-05-30 NOTE — Assessment & Plan Note (Signed)
Stage IB, 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  S/p neoadjuvant TCHP, left lumpectomy, adjuvant radiation, and a year of Herceptin/Perjeta -She began adjuvant Zoladex until BSO and letrozole in 11/2021  -Joanna Osborn is clinically doing well.  Tolerating letrozole with moderate hot flashes, joint stiffness, hair thinning and brittle nails. I recommend skin/hair/nail vitamin or biotin.  Side effects are manageable.

## 2024-05-31 ENCOUNTER — Inpatient Hospital Stay: Attending: Hematology

## 2024-05-31 ENCOUNTER — Inpatient Hospital Stay (HOSPITAL_BASED_OUTPATIENT_CLINIC_OR_DEPARTMENT_OTHER): Admitting: Hematology

## 2024-05-31 ENCOUNTER — Other Ambulatory Visit: Payer: Self-pay

## 2024-05-31 VITALS — BP 130/88 | HR 90 | Temp 98.5°F | Resp 17 | Wt 221.7 lb

## 2024-05-31 DIAGNOSIS — Z1721 Progesterone receptor positive status: Secondary | ICD-10-CM | POA: Insufficient documentation

## 2024-05-31 DIAGNOSIS — Z79899 Other long term (current) drug therapy: Secondary | ICD-10-CM | POA: Diagnosis not present

## 2024-05-31 DIAGNOSIS — M722 Plantar fascial fibromatosis: Secondary | ICD-10-CM | POA: Diagnosis not present

## 2024-05-31 DIAGNOSIS — K869 Disease of pancreas, unspecified: Secondary | ICD-10-CM

## 2024-05-31 DIAGNOSIS — N951 Menopausal and female climacteric states: Secondary | ICD-10-CM | POA: Insufficient documentation

## 2024-05-31 DIAGNOSIS — Z79811 Long term (current) use of aromatase inhibitors: Secondary | ICD-10-CM | POA: Diagnosis not present

## 2024-05-31 DIAGNOSIS — C50412 Malignant neoplasm of upper-outer quadrant of left female breast: Secondary | ICD-10-CM

## 2024-05-31 DIAGNOSIS — Z170 Estrogen receptor positive status [ER+]: Secondary | ICD-10-CM | POA: Insufficient documentation

## 2024-05-31 DIAGNOSIS — Z1731 Human epidermal growth factor receptor 2 positive status: Secondary | ICD-10-CM | POA: Diagnosis not present

## 2024-05-31 DIAGNOSIS — Z800 Family history of malignant neoplasm of digestive organs: Secondary | ICD-10-CM | POA: Diagnosis not present

## 2024-05-31 DIAGNOSIS — Z923 Personal history of irradiation: Secondary | ICD-10-CM | POA: Diagnosis not present

## 2024-05-31 DIAGNOSIS — Z9221 Personal history of antineoplastic chemotherapy: Secondary | ICD-10-CM | POA: Diagnosis not present

## 2024-05-31 LAB — CMP (CANCER CENTER ONLY)
ALT: 19 U/L (ref 0–44)
AST: 18 U/L (ref 15–41)
Albumin: 4.5 g/dL (ref 3.5–5.0)
Alkaline Phosphatase: 79 U/L (ref 38–126)
Anion gap: 10 (ref 5–15)
BUN: 19 mg/dL (ref 6–20)
CO2: 23 mmol/L (ref 22–32)
Calcium: 10 mg/dL (ref 8.9–10.3)
Chloride: 108 mmol/L (ref 98–111)
Creatinine: 0.85 mg/dL (ref 0.44–1.00)
GFR, Estimated: >60 mL/min (ref >60)
Glucose, Bld: 143 mg/dL — ABNORMAL HIGH (ref 70–99)
Potassium: 3.9 mmol/L (ref 3.5–5.1)
Sodium: 141 mmol/L (ref 135–145)
Total Bilirubin: 0.8 mg/dL (ref 0.0–1.2)
Total Protein: 7.4 g/dL (ref 6.5–8.1)

## 2024-05-31 LAB — CBC WITH DIFFERENTIAL (CANCER CENTER ONLY)
Abs Immature Granulocytes: 0.01 K/uL (ref 0.00–0.07)
Basophils Absolute: 0 K/uL (ref 0.0–0.1)
Basophils Relative: 0 %
Eosinophils Absolute: 0.1 K/uL (ref 0.0–0.5)
Eosinophils Relative: 2 %
HCT: 39.9 % (ref 36.0–46.0)
Hemoglobin: 13 g/dL (ref 12.0–15.0)
Immature Granulocytes: 0 %
Lymphocytes Relative: 34 %
Lymphs Abs: 2.3 K/uL (ref 0.7–4.0)
MCH: 28.4 pg (ref 26.0–34.0)
MCHC: 32.6 g/dL (ref 30.0–36.0)
MCV: 87.3 fL (ref 80.0–100.0)
Monocytes Absolute: 0.3 K/uL (ref 0.1–1.0)
Monocytes Relative: 4 %
Neutro Abs: 4 K/uL (ref 1.7–7.7)
Neutrophils Relative %: 60 %
Platelet Count: 405 K/uL — ABNORMAL HIGH (ref 150–400)
RBC: 4.57 MIL/uL (ref 3.87–5.11)
RDW: 13.3 % (ref 11.5–15.5)
WBC Count: 6.7 K/uL (ref 4.0–10.5)
nRBC: 0 % (ref 0.0–0.2)

## 2024-05-31 NOTE — Progress Notes (Signed)
 [facility]   Telephone:([phone]) [phone] Fax:(336) [phone]   Clinic Follow up Note   Patient Care Team: Joanna Osborn, [place] as PCP - General (Physician Assistant) Joanna Nanetta SAILOR, RN as Oncology Nurse Navigator Joanna Cough, MD as Consulting Physician (General Surgery) Joanna Callander, MD as Consulting Physician (Hematology) Dewey Rush, MD as Consulting Physician (Radiation Oncology) Joanna Callander, MD as Consulting Physician (Hematology) Imaging, [facility] as Consulting Physician (Diagnostic Radiology)  Date of Service:  05/31/2024  CHIEF COMPLAINT: f/u of breast cancer  CURRENT THERAPY:  Adjuvant letrozole   Oncology History   Malignant neoplasm of upper-outer quadrant of left breast in female, estrogen receptor positive (HCC) Stage IB, 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  S/p neoadjuvant TCHP, left lumpectomy, adjuvant radiation, and a year of Herceptin /Perjeta  -She began adjuvant Zoladex  until BSO and letrozole  in 11/2021  -[name] is clinically doing well.  Tolerating letrozole  with moderate hot flashes, joint stiffness, hair thinning and brittle nails. I recommend skin/hair/nail vitamin or biotin.  Side effects are manageable.    Assessment & Plan Malignant neoplasm of upper-outer quadrant of left breast, status post treatment Status post treatment for breast cancer with ongoing letrozole  therapy since May 2023. Incision site healed well with minimal scar tissue. Due for mammogram in December 2025. Dense breast tissue noted, category C. 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 No symptoms directly attributed to the cyst. Monitoring favored by gastroenterologist. Potential for biopsy if cyst enlarges further. - Sent message to  gastroenterologist's office to schedule follow-up - Will consider biopsy if cyst enlarges further  Plantar fasciitis, right worse than left Chronic plantar fasciitis with right side more affected. Previous cortisone injections provided limited relief. Symptoms attributed to years of standing rather than medication side effects. - Continue current management  Hot flashes and sleep disturbance secondary to menopausal state Persistent hot flashes affecting sleep. Managed with environmental adjustments such as using a fan. - Continue current management  Intermittent abdominal pain and nausea-like symptoms Intermittent abdominal pain and nausea-like symptoms, possibly related to previous breast cancer treatment. Symptoms include dry heaving without vomiting, occurring about once a week. No clear correlation with pancreatic cyst.  No evidence of cancer recurrence in abdomen on recent MRI. - Continue to monitor symptoms - Use ginger candy for symptomatic relief -f/u with GI   Plan - She is overall doing well, lab reviewed, exam was unremarkable, no clinical concern for cancer recurrence. - Continue adjuvant letrozole  - Will message Dr. Melba office for her follow-up - Lab and follow-up in 6 months     SUMMARY OF ONCOLOGIC HISTORY: Oncology History Overview Note   Cancer Staging  Malignant neoplasm of upper-outer quadrant of left breast in female, estrogen receptor positive (HCC) Staging form: Breast, AJCC 8th Edition - Clinical stage from 03/07/2021: Stage IB (cT2, cN0, cM0, G3, ER+, PR+, HER2+) - Signed by Joanna Callander, MD on 03/17/2021 Stage prefix: Initial diagnosis Histologic grading system: 3 grade system - Pathologic stage from 08/25/2021: No Stage Recommended (ypT0, pN0, cM0, G3, ER+, PR+, HER2+) - Signed by Joanna Callander, MD on 09/05/2021 Stage prefix: Post-therapy Response to neoadjuvant therapy: Complete response Nuclear grade: G3 Histologic grading system: 3 grade system      Malignant neoplasm of upper-outer quadrant of left breast in female, estrogen receptor positive (  HCC)  03/05/2021 Mammogram   Diagnostic Left Mammogram; Left Breast Ultrasound  IMPRESSION: At the palpable site of concern in the left breast at 12 o'clock there is a suspicious mass measuring 2.1 cm.   03/07/2021 Cancer Staging   Staging form: Breast, AJCC 8th Edition - Clinical stage from 03/07/2021: Stage IB (cT2, cN0, cM0, G3, ER+, PR+, HER2+) - Signed by Joanna Callander, MD on 03/17/2021 Stage prefix: Initial diagnosis Histologic grading system: 3 grade system   03/07/2021 Pathology Results   Diagnosis Breast, left, needle core biopsy, left breast 12 o' clock - INVASIVE DUCTAL CARCINOMA - SEE COMMENT Based on the biopsy, the carcinoma appears Nottingham grade 3 of 3  PROGNOSTIC INDICATORS Results: IMMUNOHISTOCHEMICAL AND MORPHOMETRIC ANALYSIS PERFORMED MANUALLY The tumor cells are POSITIVE for Her2 (3+). Estrogen Receptor: 30%, POSITIVE, WEAK STAINING INTENSITY Progesterone Receptor: 15%, POSITIVE, MODERATE STAINING INTENSITY Proliferation Marker Ki67: 40%     03/13/2021 Initial Diagnosis   Malignant neoplasm of upper-outer quadrant of left breast in female, estrogen receptor positive (HCC)   03/26/2021 Imaging   MRI Breast  IMPRESSION: 1. 2.5 cm biopsy-proven malignancy within the UPPER LEFT breast. 2. Abnormal lymph node with focal cortical thickening in the posterior UPPER-OUTER LEFT breast/LOWER LEFT axilla. 2nd-look ultrasound with possible biopsy is recommended. 3. Indeterminate 0.8 cm OUTER RIGHT breast mass. 2nd-look ultrasound with possible biopsy is recommended.   03/29/2021 Genetic Testing   Negative hereditary cancer genetic testing: no pathogenic variants detected in [employer] CustomNext-Cancer +RNAinsight Panel.  The report date is March 29, 2021.   The CustomNext-Cancer+RNAinsight panel offered by [employer] includes sequencing and rearrangement analysis for the  following 47 genes:  APC, ATM, AXIN2, BARD1, BMPR1A, BRCA1, BRCA2, BRIP1, CDH1, CDK4, CDKN2A, CHEK2, DICER1, EPCAM, GREM1, HOXB13, MEN1, MLH1, MSH2, MSH3, MSH6, MUTYH, NBN, NF1, NF2, NTHL1, PALB2, PMS2, POLD1, POLE, PTEN, RAD51C, RAD51D, RECQL, RET, SDHA, SDHAF2, SDHB, SDHC, SDHD, SMAD4, SMARCA4, STK11, TP53, TSC1, TSC2, and VHL.  RNA data is routinely analyzed for use in variant interpretation for all genes.   04/04/2021 - 07/26/2021 Chemotherapy   Patient is on Treatment Plan : BREAST  Docetaxel  + Carboplatin  + Trastuzumab  + Pertuzumab   (TCHP) q21d      04/14/2021 Imaging   US  Bilateral Breast  IMPRESSION: 1. No sonographic correlate for the 8 mm enhancing mass in the outer right breast. Recommendation is to prior seed with MRI guided biopsy. 2. An undulating, low lying lymph node along the 1 o'clock axis of the left breast demonstrates no areas of cortical thickening beyond 3 mm.   08/14/2021 - 03/12/2022 Chemotherapy   Patient is on Treatment Plan : BREAST Trastuzumab  + Pertuzumab  q21d     08/25/2021 Cancer Staging   Staging form: Breast, AJCC 8th Edition - Pathologic stage from 08/25/2021: No Stage Recommended (ypT0, pN0, cM0, G3, ER+, PR+, HER2+) - Signed by Joanna Callander, MD on 09/05/2021 Stage prefix: Post-therapy Response to neoadjuvant therapy: Complete response Nuclear grade: G3 Histologic grading system: 3 grade system   08/25/2021 Definitive Surgery   FINAL MICROSCOPIC DIAGNOSIS:   A. BREAST, LEFT, LUMPECTOMY:  - No evidence of residual carcinoma - complete therapeutic response  - Therapy related changes, including hyalin fibrosis  - Fibrocystic change with urinary  - See comment   B. BREAST, LEFT ADDITIONAL MEDIAL MARGIN, EXCISION:  - Fibrocystic change  - Negative for carcinoma   C. BREAST, LEFT ADDITIONAL SUPERIOR MARGIN, EXCISION:  - Fibrocystic change  - Negative for carcinoma   D. BREAST, LEFT ADDITIONAL POSTERIOR MARGIN, EXCISION:  -  Benign breast parenchyma  -  Negative for carcinoma   E. BREAST, LEFT ADDITIONAL ANTERIOR MARGIN, EXCISION:  - Benign breast parenchyma  - Negative for carcinoma   F. LYMPH NODE, LEFT AXILLARY, SENTINEL, EXCISION:  - Lymph node, negative for carcinoma (0/1)   G. LYMPH NODE, LEFT AXILLARY, SENTINEL, EXCISION:  - Lymph node, negative for carcinoma (0/1)    COMMENT:  The appropriate pathologic stage (AJCC eighth edition) is ypT0 ypN0.       Discussed the use of AI scribe software for clinical note transcription with the patient, who gave verbal consent to proceed.  History of Present Illness Joanna Osborn is a 53 year old female with breast cancer who presents for follow-up.  She has been on letrozole  since May 2023 and experiences hot flashes that disrupt her sleep. Joint stiffness is present, particularly when getting up, which she attributes to plantar fasciitis. She experiences intermittent stomach pain over the past month, sometimes after eating, lasting about an hour. No nausea, but she experiences dry heaving about once a week, relieved by ginger candy. Discomfort on the left side of her chest, particularly when removing her bra, is described as soreness. She is currently taking metformin  and Wellbutrin , with a recent weight gain of two pounds.     All other systems were reviewed with the patient and are negative.  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   Malignant neoplasm of  upper-outer quadrant of left breast in female, 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   oncologist--- dr Joanna;  Stage IB, IDC, ER/ PR/ HER2 +;   chemo 04-04-2021  to 07-26-2021 and started 08-14-2021;  raditation completed 11-18-2021   Port-A-Cath in place 04/02/2021   Prediabetes    Wears glasses     SURGICAL HISTORY: Past Surgical History:  Procedure Laterality Date   BIOPSY  02/19/2022   Procedure: BIOPSY;  Surgeon: Wilhelmenia Aloha Raddle., MD;  Location: [facility] END[facility]COPY;  Service: Gastroenterology;;   BREAST LUMPECTOMY WITH RADIOACTIVE SEED AND SENTINEL LYMPH NODE BIOPSY Left 08/25/2021   Procedure: LEFT BREAST LUMPECTOMY WITH RADIOACTIVE SEED AND AXILLARY SENTINEL LYMPH NODE BIOPSY;  Surgeon: Joanna Cough, MD;  Location: [facility];  Service: General;  Laterality: Left;   CARPAL TUNNEL RELEASE Right 08/17/2008   @[facility]  by dr murrell;   and excision wrist ganglian cyst   CHOLECYSTECTOMY, LAPAROSCOPIC  06/27/2002   @[facility]  by dr adel   COLONOSCOPY WITH PROPOFOL  N/A 03/15/2023   Procedure: COLONOSCOPY WITH PROPOFOL ;  Surgeon: Wilhelmenia Aloha Raddle., MD;  Location: [facility];  Service: Gastroenterology;  Laterality: N/A;   ESOPHAGOGASTRODUODENOSCOPY (EGD) WITH PROPOFOL  N/A 02/19/2022   Procedure: ESOPHAGOGASTRODUODENOSCOPY (EGD) WITH PROPOFOL ;  Surgeon: Wilhelmenia Aloha Raddle., MD;  Location: [facility] ENDOSCOPY;  Service: Gastroenterology;  Laterality: N/A;   ESOPHAGOGASTRODUODENOSCOPY (EGD) WITH PROPOFOL  N/A 07/13/2022   Procedure: ESOPHAGOGASTRODUODENOSCOPY (EGD) WITH PROPOFOL ;  Surgeon: Wilhelmenia Aloha Raddle., MD;  Location: [facility];  Service: Gastroenterology;  Laterality: N/A;   EUS N/A 02/19/2022   Procedure: UPPER ENDOSCOPIC ULTRASOUND (EUS) RADIAL;  Surgeon: Wilhelmenia Aloha Raddle., MD;  Location: [facility] ENDOSCOPY;  Service: Gastroenterology;  Laterality: N/A;   FINE NEEDLE ASPIRATION  02/19/2022   Procedure: FINE NEEDLE ASPIRATION (FNA) LINEAR;  Surgeon:  Wilhelmenia Aloha Raddle., MD;  Location: [facility] ENDOSCOPY;  Service: Gastroenterology;;   HYSTEROSCOPY WITH D & C  11/21/2007   @[facility]  by dr latisha;  polypectomy   INSERTION OF MESH N/A 10/28/2016   Procedure: INSERTION OF MESH;  Surgeon: Mitzie DELENA Freund, MD;  Location: [facility];  Service: General;  Laterality: N/A;   LAPAROSCOPIC SALPINGO OOPHERECTOMY Bilateral 02/02/2022   Procedure: LAPAROSCOPIC BILATERAL SALPINGO OOPHORECTOMY;  Surgeon: Johnnye Ade, MD;  Location: [facility];  Service: Gynecology;  Laterality: Bilateral;   OOPHORECTOMY     PORT-A-CATH REMOVAL Right 08/24/2022   Procedure: REMOVAL PORT-A-CATH;  Surgeon: Joanna Cough, MD;  Location: [facility];  Service: General;  Laterality: Right;   PORTACATH PLACEMENT Right 04/02/2021   Procedure: INSERTION PORT-A-CATH;  Surgeon: Joanna Cough, MD;  Location: [facility];  Service: General;  Laterality: Right;   TUBAL LIGATION  1999   VENTRAL HERNIA REPAIR N/A 10/28/2016   Procedure: LAPAROSCOPIC VENTRAL HERNIA REPAIR;  Surgeon: Mitzie DELENA Freund, MD;  Location: [facility];  Service: General;  Laterality: N/A;    I have reviewed the social history and family history with the patient and they are unchanged from previous note.  ALLERGIES:  is allergic to endocet [oxycodone -acetaminophen ], propoxyphene, and tape.  MEDICATIONS:  Current Outpatient Medications  Medication Sig Dispense Refill   APPLE CIDER VINEGAR PO Take by mouth.     buPROPion  (WELLBUTRIN  SR) 150 MG 12 hr tablet Take 1 tablet (150 mg total) by mouth daily. 30 tablet 0   Cholecalciferol (VITAMIN D3 PO) Take 1 tablet by mouth daily.     gabapentin  (NEURONTIN ) 100 MG capsule Take 1 capsule (100 mg total) by mouth at bedtime. 30 capsule 3   hydrochlorothiazide  (HYDRODIURIL ) 25 MG tablet Take 1 tablet (25 mg total) by mouth daily. 30 tablet 0   letrozole  (FEMARA ) 2.5 MG tablet TAKE 1 TABLET BY MOUTH EVERY DAY 90 tablet 1    Magnesium  300 MG CAPS 1 capsule with a meal Orally Once a day     meloxicam  (MOBIC ) 7.5 MG tablet TAKE 2 TABLETS BY MOUTH EVERY DAY 60 tablet 1   metFORMIN  (GLUCOPHAGE ) 500 MG tablet Take 1 tablet (500 mg total) by mouth 2 (two) times daily with a meal. 60 tablet 0   Misc Natural Products (COMPLETE LIVER CLEANSE PO) Take by mouth.     omeprazole  (PRILOSEC) 40 MG capsule Take 1 capsule (40 mg total) by mouth daily. 180 capsule 0   Vitamin D , Ergocalciferol , (DRISDOL ) 1.25 MG (50000 UNIT) CAPS capsule Take 1 pill every 14 days 5 capsule 0   No current facility-administered medications for this visit.    PHYSICAL EXAMINATION: ECOG PERFORMANCE STATUS: 0 - Asymptomatic  Vitals:   05/31/24 0943  BP: 130/88  Pulse: 90  Resp: 17  Temp: 98.5 F (36.9 C)  SpO2: 99%   Wt Readings from Last 3 Encounters:  05/31/24 221 lb 11.2 oz (100.6 kg)  05/10/24 218 lb (98.9 kg)  04/19/24 216 lb (98 kg)     GENERAL:alert, no distress and comfortable SKIN: skin color, texture, turgor are normal, no rashes or significant  lesions EYES: normal, Conjunctiva are pink and non-injected, sclera clear NECK: supple, thyroid normal size, non-tender, without nodularity LYMPH:  no palpable lymphadenopathy in the cervical, axillary  LUNGS: clear to auscultation and percussion with normal breathing effort HEART: regular rate & rhythm and no murmurs and no lower extremity edema ABDOMEN:abdomen soft, non-tender and normal bowel sounds Musculoskeletal:no cyanosis of digits and no clubbing  NEURO: alert & oriented x 3 with fluent speech, no focal motor/sensory deficits Breasts: Breast inspection showed them to be symmetrical with no nipple discharge. Palpation of the breasts and axilla revealed no obvious mass that I could appreciate.  Physical Exam   LABORATORY DATA:  I have reviewed the data as listed    Latest Ref Rng & Units 05/31/2024    9:21 AM 01/20/2024    9:16 AM 09/30/2023    7:42 AM  CBC  WBC 4.0 - 10.5  K/uL 6.7  6.9  6.1   Hemoglobin 12.0 - 15.0 g/dL 86.9  86.9  87.3   Hematocrit 36.0 - 46.0 % 39.9  40.3  38.5   Platelets 150 - 400 K/uL 405  364  395         Latest Ref Rng & Units 05/31/2024    9:21 AM 01/20/2024    9:16 AM 09/30/2023    7:42 AM  CMP  Glucose 70 - 99 mg/dL 856  897  866   BUN 6 - 20 mg/dL 19  23  23    Creatinine 0.44 - 1.00 mg/dL 9.14  9.21  9.14   Sodium 135 - 145 mmol/L 141  143  141   Potassium 3.5 - 5.1 mmol/L 3.9  4.2  3.8   Chloride 98 - 111 mmol/L 108  104  106   CO2 22 - 32 mmol/L 23  20  25    Calcium 8.9 - 10.3 mg/dL 89.9  89.8  9.5   Total Protein 6.5 - 8.1 g/dL 7.4  7.2  7.1   Total Bilirubin 0.0 - 1.2 mg/dL 0.8  0.4  0.6   Alkaline Phos 38 - 126 U/L 79  85  71   AST 15 - 41 U/L 18  20  16    ALT 0 - 44 U/L 19  22  20        RADIOGRAPHIC STUDIES: I have personally reviewed the radiological images as listed and agreed with the findings in the report. No results found.    No orders of the defined types were placed in this encounter.  All questions were answered. The patient knows to call the clinic with any problems, questions or concerns. No barriers to learning was detected. The total time spent in the appointment was 25 minutes, including review of chart and various tests results, discussions about plan of care and coordination of care plan     Onita Mattock, MD 05/31/2024

## 2024-05-31 NOTE — Telephone Encounter (Signed)
-----   Message from Onita Mattock sent at 05/31/2024 10:36 AM EST ----- Joanna Osborn,  Could you schedule this pt to see Dr. Yancy? See his message below, thx  Onita  ----- Message ----- From: Rebecka, Rad Results In Sent: 05/23/2024  10:45 AM EST To: Onita Mattock, MD

## 2024-05-31 NOTE — Telephone Encounter (Signed)
-----   Message from Onita Mattock sent at 05/31/2024 10:36 AM EST ----- Roslin Norwood,  Could you schedule this pt to see Dr. Yancy? See his message below, thx  Onita  ----- Message ----- From: Rebecka, Rad Results In Sent: 05/23/2024  10:45 AM EST To: Onita Mattock, MD

## 2024-05-31 NOTE — Progress Notes (Signed)
 Verbal order w/readback from Dr. Lanny for Contrast Enhanced BL Mammogram to be done at [facility] by 07/08/2024.  Order placed and [facility] schedulers (Crissie & Heather) notified.

## 2024-06-06 ENCOUNTER — Encounter: Payer: Self-pay | Admitting: Gastroenterology

## 2024-06-06 ENCOUNTER — Other Ambulatory Visit

## 2024-06-06 ENCOUNTER — Ambulatory Visit (INDEPENDENT_AMBULATORY_CARE_PROVIDER_SITE_OTHER): Admitting: Gastroenterology

## 2024-06-06 VITALS — BP 120/74 | HR 93 | Ht 65.0 in | Wt 218.5 lb

## 2024-06-06 DIAGNOSIS — Z800 Family history of malignant neoplasm of digestive organs: Secondary | ICD-10-CM

## 2024-06-06 DIAGNOSIS — K862 Cyst of pancreas: Secondary | ICD-10-CM

## 2024-06-06 DIAGNOSIS — K869 Disease of pancreas, unspecified: Secondary | ICD-10-CM

## 2024-06-06 NOTE — Patient Instructions (Signed)
 Your provider has requested that you go to the basement level for lab work before leaving today. Press B on the elevator. The lab is located at the first door on the left as you exit the elevator.  Due to recent changes in healthcare laws, you may see the results of your imaging and laboratory studies on MyChart before your provider has had a chance to review them.  We understand that in some cases there may be results that are confusing or concerning to you. Not all laboratory results come back in the same time frame and the provider may be waiting for multiple results in order to interpret others.  Please give us  48 hours in order for your provider to thoroughly review all the results before contacting the office for clarification of your results.   _______________________________________________________  If your blood pressure at your visit was 140/90 or greater, please contact your primary care physician to follow up on this.  _______________________________________________________  If you are age 52 or older, your body mass index should be between 23-30. Your Body mass index is 36.36 kg/m. If this is out of the aforementioned range listed, please consider follow up with your Primary Care Provider.  If you are age 67 or younger, your body mass index should be between 19-25. Your Body mass index is 36.36 kg/m. If this is out of the aformentioned range listed, please consider follow up with your Primary Care Provider.   ________________________________________________________  The [facility] providers would like to encourage you to use MYCHART to communicate with providers for non-urgent requests or questions.  Due to long hold times on the telephone, sending your provider a message by [facility] may be a faster and more efficient way to get a response.  Please allow 48 business hours for a response.  Please remember that this is for non-urgent requests.   _______________________________________________________  [facility] is using a team-based approach to care.  Your team is made up of your doctor and two to three APPS. Our APPS (Nurse Practitioners and Physician Assistants) work with your physician to ensure care continuity for you. They are fully qualified to address your health concerns and develop a treatment plan. They communicate directly with your gastroenterologist to care for you. Seeing the Advanced Practice Practitioners on your physician's team can help you by facilitating care more promptly, often allowing for earlier appointments, access to diagnostic testing, procedures, and other specialty referrals.     You have been scheduled for an endoscopy. Please follow written instructions given to you at your visit today.  If you use inhalers (even only as needed), please bring them with you on the day of your procedure.  If you take any of the following medications, they will need to be adjusted prior to your procedure:   DO NOT TAKE 7 DAYS PRIOR TO TEST- Trulicity (dulaglutide) Ozempic, Wegovy (semaglutide) Mounjaro, Zepbound (tirzepatide) Bydureon Bcise (exanatide extended release)  DO NOT TAKE 1 DAY PRIOR TO YOUR TEST Rybelsus (semaglutide) Adlyxin (lixisenatide) Victoza (liraglutide) Byetta (exanatide) ____________________________________________________________  Thank you for choosing me and [facility].  Dr. Wilhelmenia

## 2024-06-06 NOTE — Progress Notes (Unsigned)
 GASTROENTEROLOGY OUTPATIENT CLINIC VISIT   Primary Care Provider Redmon, [place], [place] [address] [place] [address] [phone]  Referring Provider Redmon, [place], [place] [address] [place],  [place] [address] [phone]  Patient Profile: KYMARI LOLLIS is a 53 y.o. female with a pmh significant for  The patient presents to the [facility] for an evaluation and management of problem(s) noted below:  Problem List No diagnosis found.  History of Present Illness    The patient does/does not take NSAIDs or BC/Goody Powder. Patient has/has not had an EGD. Patient has/has not had a Colonoscopy.  GI Review of Systems Positive as above Negative for  Pyrosis; Reflux; Regurgitation; Dysphagia; Odynophagia; Globus; Post-prandial cough; Nocturnal cough; Nasal regurgitation; Epigastric pain; Nausea; Vomiting; Hematemesis; Jaundice; Change in Appetite; Early satiety; Abdominal pain; Abdominal bloating; Eructation; Flatulence; Change in BM Frequency; Change in BM Consistency; Constipation; Diarrhea; Incontinence; Urgency; Tenesmus; Hematochezia; Melena  Review of Systems General: Denies fevers/chills/weight loss unintentionally Cardiovascular: Denies chest pain Pulmonary: Denies shortness of breath Gastroenterological: See HPI Genitourinary: Denies darkened urine Hematological: Denies easy bruising/bleeding Endocrine: Denies temperature intolerance Dermatological: Denies jaundice Psychological: Mood is stable  Medications Current Outpatient Medications  Medication Sig Dispense Refill   APPLE CIDER VINEGAR PO Take by mouth.     buPROPion  (WELLBUTRIN  SR) 150 MG 12 hr tablet Take 1 tablet (150 mg total) by mouth daily. 30 tablet 0   Cholecalciferol (VITAMIN D3 PO) Take 1 tablet by mouth daily.     gabapentin  (NEURONTIN ) 100 MG capsule Take 1 capsule (100 mg total) by mouth at bedtime. 30 capsule 3   hydrochlorothiazide   (HYDRODIURIL ) 25 MG tablet Take 1 tablet (25 mg total) by mouth daily. 30 tablet 0   letrozole  (FEMARA ) 2.5 MG tablet TAKE 1 TABLET BY MOUTH EVERY DAY 90 tablet 1   Magnesium  300 MG CAPS 1 capsule with a meal Orally Once a day     meloxicam  (MOBIC ) 7.5 MG tablet TAKE 2 TABLETS BY MOUTH EVERY DAY 60 tablet 1   metFORMIN  (GLUCOPHAGE ) 500 MG tablet Take 1 tablet (500 mg total) by mouth 2 (two) times daily with a meal. 60 tablet 0   Misc Natural Products (COMPLETE LIVER CLEANSE PO) Take by mouth.     omeprazole  (PRILOSEC) 40 MG capsule Take 1 capsule (40 mg total) by mouth daily. 180 capsule 0   Vitamin D , Ergocalciferol , (DRISDOL ) 1.25 MG (50000 UNIT) CAPS capsule Take 1 pill every 14 days 5 capsule 0   No current facility-administered medications for this visit.    Allergies Allergies  Allergen Reactions   Endocet [Oxycodone -Acetaminophen ] Itching   Propoxyphene Itching and Other (See Comments)    Darvocet*   Tape Rash    Histories 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   Malignant neoplasm of upper-outer quadrant of left breast in female,  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   oncologist--- dr lanny;  Stage IB, IDC, ER/ PR/ HER2 +;   chemo 04-04-2021  to 07-26-2021 and started 08-14-2021;  raditation completed 11-18-2021   Port-A-Cath in place 04/02/2021   Prediabetes    Wears glasses    Past Surgical History:  Procedure Laterality Date   BIOPSY  02/19/2022    Procedure: BIOPSY;  Surgeon: Wilhelmenia Aloha Raddle., MD;  Location: [facility] ENDOSCOPY;  Service: Gastroenterology;;   BREAST LUMPECTOMY WITH RADIOACTIVE SEED AND SENTINEL LYMPH NODE BIOPSY Left 08/25/2021   Procedure: LEFT BREAST LUMPECTOMY WITH RADIOACTIVE SEED AND AXILLARY SENTINEL LYMPH NODE BIOPSY;  Surgeon: Ebbie Cough, MD;  Location: [facility];  Service: General;  Laterality: Left;   CARPAL TUNNEL RELEASE Right 08/17/2008   @[facility]  by dr murrell;   and excision wrist ganglian cyst   CHOLECYSTECTOMY, LAPAROSCOPIC  06/27/2002   @[facility]  by dr adel   COLONOSCOPY WITH PROPOFOL  N/A 03/15/2023   Procedure: COLONOSCOPY WITH PROPOFOL ;  Surgeon: Wilhelmenia Aloha Raddle., MD;  Location: [facility];  Service: Gastroenterology;  Laterality: N/A;   ESOPHAGOGASTRODUODENOSCOPY (EGD) WITH PROPOFOL  N/A 02/19/2022   Procedure: ESOPHAGOGASTRODUODENOSCOPY (EGD) WITH PROPOFOL ;  Surgeon: Wilhelmenia Aloha Raddle., MD;  Location: [facility] ENDOSCOPY;  Service: Gastroenterology;  Laterality: N/A;   ESOPHAGOGASTRODUODENOSCOPY (EGD) WITH PROPOFOL  N/A 07/13/2022   Procedure: ESOPHAGOGASTRODUODENOSCOPY (EGD) WITH PROPOFOL ;  Surgeon: Wilhelmenia Aloha Raddle., MD;  Location: [facility]COPY;  Service: Gastroenterology;  Laterality: N/A;   EUS N/A 02/19/2022   Procedure: UPPER ENDOSCOPIC ULTRASOUND (EUS) RADIAL;  Surgeon: Wilhelmenia Aloha Raddle., MD;  Location: [facility] END[facility]COPY;  Service: Gastroenterology;  Laterality: N/A;   FINE NEEDLE ASPIRATION  02/19/2022   Procedure: FINE NEEDLE ASPIRATION (FNA) LINEAR;  Surgeon: Wilhelmenia Aloha Raddle., MD;  Location: [facility] ENDOSCOPY;  Service: Gastroenterology;;   HYSTEROSCOPY WITH D & C  11/21/2007   @[facility]  by dr latisha;  polypectomy   INSERTION OF MESH N/A 10/28/2016   Procedure: INSERTION OF MESH;  Surgeon: Mitzie DELENA Freund, MD;  Location[facility] OR;  Service: General;  Laterality: N/A;   LAPAROSCOPIC SALPINGO OOPHERECTOMY Bilateral 02/02/2022   Procedure: LAPAROSCOPIC BILATERAL SALPINGO  OOPHORECTOMY;  Surgeon: Johnnye Ade, MD;  Location: [facility];  Service: Gynecology;  Laterality: Bilateral;   OOPHORECTOMY     PORT-A-CATH REMOVAL Right 08/24/2022   Procedure: REMOVAL PORT-A-CATH;  Surgeon: Ebbie Cough, MD;  Location: [facility];  Service: General;  Laterality: Right;   PORTACATH PLACEMENT Right 04/02/2021   Procedure: INSERTION PORT-A-CATH;  Surgeon: Ebbie Cough, MD;  Location: [facility];  Service: General;  Laterality: Right;   TUBAL LIGATION  1999   VENTRAL HERNIA REPAIR N/A 10/28/2016   Procedure: LAPAROSCOPIC VENTRAL HERNIA REPAIR;  Surgeon: Mitzie DELENA Freund, MD;  Location: [facility] OR;  Service: General;  Laterality: N/A;   Social History   Socioeconomic History   Marital status: Married    Spouse name: Not on file   Number of children: 2   Years of education: Not on file   Highest education level: Not on file  Occupational History   Not on file  Tobacco Use   Smoking status: Never   Smokeless tobacco: Never  Vaping Use   Vaping status: Never Used  Substance and Sexual Activity   Alcohol use: Not Currently    Comment: one glass wine twice monthly   Drug use: Never   Sexual activity: Yes    Birth control/protection: None  Other Topics Concern   Not on file  Social  History Narrative   Not on file   Social Drivers of Health   Financial Resource Strain: Not on file  Food Insecurity: No Food Insecurity (05/31/2024)   Hunger Vital Sign    Worried About Running Out of Food in the Last Year: Never true    Ran Out of Food in the Last Year: Never true  Transportation Needs: No Transportation Needs (05/31/2024)   PRAPARE - Administrator, Civil Service (Medical): No    Lack of Transportation (Non-Medical): No  Physical Activity: Not on file  Stress: Not on file  Social Connections: Not on file  Intimate Partner Violence: Not At Risk (05/31/2024)   Humiliation, Afraid, Rape, and Kick  questionnaire    Fear of Current or Ex-Partner: No    Emotionally Abused: No    Physically Abused: No    Sexually Abused: No   Family History  Problem Relation Age of Onset   Hypertension Mother    Diabetes Mother    Kidney disease Mother    Heart disease Father    Hypertension Father    Diabetes Father    Sleep apnea Father    Colon cancer Paternal Aunt    Colon cancer Paternal Grandmother        dx unknown age   Colon cancer Paternal Grandfather        dx after 11   Breast cancer Cousin    Hypertension Other    Cancer Other    I have reviewed her medical, social, and family history in detail and updated the electronic medical record as necessary.    PHYSICAL EXAMINATION  BP 120/74   Pulse 93   Ht 5' 5 (1.651 m)   Wt 218 lb 8 oz (99.1 kg)   BMI 36.36 kg/m  Wt Readings from Last 3 Encounters:  06/06/24 218 lb 8 oz (99.1 kg)  05/31/24 221 lb 11.2 oz (100.6 kg)  05/10/24 218 lb (98.9 kg)   GEN: NAD, appears stated age, doesn't appear chronically ill PSYCH: Cooperative, without pressured speech EYE: Conjunctivae pink, sclerae anicteric ENT: MMM CV: Nontachycardic RESP: No audible wheezing GI: NABS, soft, NT/ND, without rebound or guarding, no HSM appreciated GU: DRE shows MSK/EXT: No significant lower extremity edema SKIN: No jaundice, no spider angiomata NEURO:  Alert & Oriented x 3, no focal deficits, no evidence of asterixis   REVIEW OF DATA  I reviewed the following data at the time of this encounter:  GI Procedures and Studies  ***  Laboratory Studies  ***  Imaging Studies  ***   ASSESSMENT  Ms. Hostler is a 53 y.o. female.  The patient is seen today for evaluation and management of:  No diagnosis found.  ***   PLAN  There are no diagnoses linked to this encounter.   No orders of the defined types were placed in this encounter.   New Prescriptions   No medications on file   Modified Medications   No medications on file    Planned  Follow Up No follow-ups on file.   Total Time in Face-to-Face and in Coordination of Care for patient including independent/personal interpretation/review of prior testing, medical history, examination, medication adjustment, communicating results with the patient directly, and documentation within the EHR is ***.   Aloha Finner, MD [facility] Office # [phone]

## 2024-06-07 ENCOUNTER — Encounter: Payer: Self-pay | Admitting: Gastroenterology

## 2024-06-07 ENCOUNTER — Encounter (INDEPENDENT_AMBULATORY_CARE_PROVIDER_SITE_OTHER): Payer: Self-pay | Admitting: Family Medicine

## 2024-06-07 ENCOUNTER — Ambulatory Visit (INDEPENDENT_AMBULATORY_CARE_PROVIDER_SITE_OTHER): Payer: Self-pay | Admitting: Family Medicine

## 2024-06-07 ENCOUNTER — Other Ambulatory Visit

## 2024-06-07 VITALS — BP 113/78 | HR 86 | Temp 98.3°F | Ht 65.0 in | Wt 215.0 lb

## 2024-06-07 DIAGNOSIS — K219 Gastro-esophageal reflux disease without esophagitis: Secondary | ICD-10-CM | POA: Diagnosis not present

## 2024-06-07 DIAGNOSIS — K862 Cyst of pancreas: Secondary | ICD-10-CM | POA: Insufficient documentation

## 2024-06-07 DIAGNOSIS — R7303 Prediabetes: Secondary | ICD-10-CM | POA: Diagnosis not present

## 2024-06-07 DIAGNOSIS — F5089 Other specified eating disorder: Secondary | ICD-10-CM | POA: Diagnosis not present

## 2024-06-07 DIAGNOSIS — K869 Disease of pancreas, unspecified: Secondary | ICD-10-CM | POA: Insufficient documentation

## 2024-06-07 DIAGNOSIS — E559 Vitamin D deficiency, unspecified: Secondary | ICD-10-CM | POA: Diagnosis not present

## 2024-06-07 DIAGNOSIS — E669 Obesity, unspecified: Secondary | ICD-10-CM

## 2024-06-07 DIAGNOSIS — Z6835 Body mass index (BMI) 35.0-35.9, adult: Secondary | ICD-10-CM

## 2024-06-07 LAB — CANCER ANTIGEN 19-9: CA 19-9: 10 U/mL (ref <34)

## 2024-06-07 MED ORDER — OMEPRAZOLE 40 MG PO CPDR: 40.0000 mg | DELAYED_RELEASE_CAPSULE | Freq: Every day | ORAL | 0 refills | Status: DC

## 2024-06-07 MED ORDER — BUPROPION HCL ER (SR) 150 MG PO TB12: 150.0000 mg | ORAL_TABLET | Freq: Every day | ORAL | 0 refills | Status: DC

## 2024-06-07 MED ORDER — METFORMIN HCL 500 MG PO TABS: 500.0000 mg | ORAL_TABLET | Freq: Two times a day (BID) | ORAL | 0 refills | Status: DC

## 2024-06-07 MED ORDER — VITAMIN D (ERGOCALCIFEROL) 1.25 MG (50000 UNIT) PO CAPS: ORAL_CAPSULE | ORAL | 0 refills | Status: DC

## 2024-06-07 NOTE — Progress Notes (Signed)
 Office: [phone]  /  Fax: [phone]  WEIGHT SUMMARY AND BIOMETRICS  Anthropometric Measurements Height: 5' 5 (1.651 m) Weight: 215 lb (97.5 kg) BMI (Calculated): 35.78 Weight at Last Visit: 218 lb Weight Lost Since Last Visit: 3 lb Weight Gained Since Last Visit: 0 Starting Weight: 230 lb Total Weight Loss (lbs): 15 lb (6.804 kg) Peak Weight: 230 lb   Body Composition  Body Fat %: 45 % Fat Mass (lbs): 92.6 lbs Muscle Mass (lbs): 116.6 lbs Total Body Water (lbs): 79 lbs Visceral Fat Rating : 12   Other Clinical Data RMR: [id number] Fasting: yes Labs: no Today's Visit #: 7 Starting Date: 01/20/24    Chief Complaint: OBESITY   History of Present Illness Joanna Osborn is a 53 year old female with obesity who presents for obesity treatment and progress assessment.  She has been following the prescribed category one eating plan infrequently, about thirty percent of the time. She is not currently exercising, often skips meals, and does not hydrate adequately. Despite these challenges, she has lost three pounds in the last month. She is attempting to increase her protein intake by choosing options like a chicken sandwich without the bread.  She is being treated for GERD with Prilosec 40 mg daily and has no significant issues with heartburn. She requests a refill for this medication.  For her prediabetes, she is on metformin  500 mg twice a day. She finds it challenging to consistently take both doses, often missing the second dose due to timing issues with dinner. She ensures to take at least one dose daily.  She is being treated for vitamin D  deficiency with ergocalciferol  50,000 IU every fourteen days and requests a refill. Additionally, she is on Wellbutrin  SR 150 mg daily for emotional eating behaviors and requests a refill for this as well.  She had recent blood work done for her oncology follow-up, which showed elevated glucose levels, but she was not fasting at the  time. Her platelet count was slightly above the normal range, but she has not been contacted by her oncologist.  Socially, she is preparing for Thanksgiving, which will be hosted at her house. She plans to manage leftovers to avoid overeating. She notes the presence of a treadmill at home, but has been unable to exercise due to a busy schedule with work and personal commitments.      PHYSICAL EXAM:  Blood pressure 113/78, pulse 86, temperature 98.3 F (36.8 C), height 5' 5 (1.651 m), weight 215 lb (97.5 kg), SpO2 99%. Body mass index is 35.78 kg/m.  DIAGNOSTIC DATA REVIEWED:  BMET    Component Value Date/Time   NA 141 05/31/2024 0921   NA 143 01/20/2024 0916   K 3.9 05/31/2024 0921   CL 108 05/31/2024 0921   CO2 23 05/31/2024 0921   GLUCOSE 143 (H) 05/31/2024 0921   BUN 19 05/31/2024 0921   BUN 23 01/20/2024 0916   CREATININE 0.85 05/31/2024 0921   CALCIUM 10.0 05/31/2024 0921   GFRNONAA >60 05/31/2024 0921   GFRAA >60 04/29/2018 2023   Lab Results  Component Value Date   HGBA1C 6.3 (H) 01/20/2024   HGBA1C 4.8 09/13/2021   Lab Results  Component Value Date   INSULIN  38.1 (H) 01/20/2024   Lab Results  Component Value Date   TSH 1.210 01/20/2024   CBC    Component Value Date/Time   WBC 6.7 05/31/2024 0921   WBC 5.3 02/24/2022 1117   RBC 4.57 05/31/2024 0921  HGB 13.0 05/31/2024 0921   HGB 13.0 01/20/2024 0916   HCT 39.9 05/31/2024 0921   HCT 40.3 01/20/2024 0916   PLT 405 (H) 05/31/2024 0921   PLT 364 01/20/2024 0916   MCV 87.3 05/31/2024 0921   MCV 89 01/20/2024 0916   MCH 28.4 05/31/2024 0921   MCHC 32.6 05/31/2024 0921   RDW 13.3 05/31/2024 0921   RDW 13.3 01/20/2024 0916   Iron Studies No results found for: IRON, TIBC, FERRITIN, IRONPCTSAT Lipid Panel     Component Value Date/Time   CHOL 168 01/20/2024 0916   TRIG 96 01/20/2024 0916   HDL 46 01/20/2024 0916   LDLCALC 104 (H) 01/20/2024 0916   Hepatic Function Panel     Component  Value Date/Time   PROT 7.4 05/31/2024 0921   PROT 7.2 01/20/2024 0916   ALBUMIN 4.5 05/31/2024 0921   ALBUMIN 4.6 01/20/2024 0916   AST 18 05/31/2024 0921   ALT 19 05/31/2024 0921   ALKPHOS 79 05/31/2024 0921   BILITOT 0.8 05/31/2024 0921      Component Value Date/Time   TSH 1.210 01/20/2024 0916   Nutritional Lab Results  Component Value Date   VD25OH 82.9 01/20/2024     Assessment and Plan Assessment & Plan Obesity with associated emotional eating behaviors and BMI 35.0-35.9 Obesity management is ongoing with a focus on dietary changes and exercise. She reports infrequent adherence to the category one eating plan, approximately 30% of the time. She is not currently exercising but has lost three pounds in the last month. Emotional eating behaviors are being addressed with Wellbutrin  SR. She acknowledges challenges with meal timing and hydration, and plans to improve exercise routine by using a treadmill at home. - Continue category one eating plan with emphasis on protein intake. - Encouraged regular exercise, aiming for at least 10 minutes most days of the week. - Refilled Wellbutrin  SR 150 mg daily.  Prediabetes Managed with metformin  500 mg twice daily. She reports difficulty in consistently taking the second dose, often forgetting it during dinner. Recent blood work showed a random glucose level of 143 mg/dL, which is not concerning given she was not fasting. Emphasis on the importance of taking both doses for optimal benefit. - Continue metformin  500 mg twice daily. - Monitor adherence to the second dose of metformin , aiming for at least 50% compliance. - Encouraged taking the second dose with dinner, with bedtime as an alternative if missed.  Gastroesophageal reflux disease (GERD) GERD is well-controlled with Prilosec 40 mg daily. She reports no recent issues with heartburn, indicating effective management. - Continue Prilosec 40 mg daily. - Refilled Prilosec  prescription.  Vitamin D  deficiency Managed with ergocalciferol  50,000 IU every 14 days. She requests a refill. - Continue ergocalciferol  50,000 IU every 14 days. - Refilled ergocalciferol  prescription.     Joanna Osborn was counseled on the importance of maintaining healthy lifestyle habits, including balanced nutrition, regular physical activity, and behavioral modifications, while taking antiobesity medication.  Patient verbalized understanding that medication is an adjunct to, not a replacement for, lifestyle changes and that the long-term success and weight maintenance depend on continued adherence to these strategies.   Lesli was informed of the importance of frequent follow up visits to maximize her success with intensive lifestyle modifications for her obesity and obesity related health conditions as recommended by USPSTF and CMS guidelines   Joanna Penton, MD

## 2024-06-08 ENCOUNTER — Ambulatory Visit: Payer: Self-pay | Admitting: Gastroenterology

## 2024-06-21 ENCOUNTER — Other Ambulatory Visit: Payer: Self-pay

## 2024-07-04 ENCOUNTER — Ambulatory Visit (INDEPENDENT_AMBULATORY_CARE_PROVIDER_SITE_OTHER): Payer: Self-pay | Admitting: Family Medicine

## 2024-07-04 ENCOUNTER — Encounter (INDEPENDENT_AMBULATORY_CARE_PROVIDER_SITE_OTHER): Payer: Self-pay | Admitting: Family Medicine

## 2024-07-04 VITALS — BP 124/83 | HR 77 | Temp 98.2°F | Ht 65.0 in | Wt 218.0 lb

## 2024-07-04 DIAGNOSIS — R7303 Prediabetes: Secondary | ICD-10-CM | POA: Diagnosis not present

## 2024-07-04 DIAGNOSIS — E559 Vitamin D deficiency, unspecified: Secondary | ICD-10-CM | POA: Diagnosis not present

## 2024-07-04 DIAGNOSIS — F3289 Other specified depressive episodes: Secondary | ICD-10-CM

## 2024-07-04 DIAGNOSIS — Z6836 Body mass index (BMI) 36.0-36.9, adult: Secondary | ICD-10-CM

## 2024-07-04 DIAGNOSIS — F5089 Other specified eating disorder: Secondary | ICD-10-CM | POA: Diagnosis not present

## 2024-07-04 DIAGNOSIS — E669 Obesity, unspecified: Secondary | ICD-10-CM

## 2024-07-04 DIAGNOSIS — K219 Gastro-esophageal reflux disease without esophagitis: Secondary | ICD-10-CM | POA: Diagnosis not present

## 2024-07-04 MED ORDER — OMEPRAZOLE 40 MG PO CPDR: 40.0000 mg | DELAYED_RELEASE_CAPSULE | Freq: Every day | ORAL | 0 refills | Status: DC

## 2024-07-04 MED ORDER — METFORMIN HCL 500 MG PO TABS: 500.0000 mg | ORAL_TABLET | Freq: Two times a day (BID) | ORAL | 0 refills | Status: DC

## 2024-07-04 MED ORDER — VITAMIN D (ERGOCALCIFEROL) 1.25 MG (50000 UNIT) PO CAPS: ORAL_CAPSULE | ORAL | 0 refills | Status: DC

## 2024-07-04 MED ORDER — BUPROPION HCL ER (SR) 150 MG PO TB12: 150.0000 mg | ORAL_TABLET | Freq: Every day | ORAL | 0 refills | Status: DC

## 2024-07-04 NOTE — Progress Notes (Signed)
 Office: [phone]  /  Fax: [phone]  WEIGHT SUMMARY AND BIOMETRICS  Anthropometric Measurements Height: 5' 5 (1.651 m) Weight: 218 lb (98.9 kg) BMI (Calculated): 36.28 Weight at Last Visit: 215 lb Weight Lost Since Last Visit: 0 Weight Gained Since Last Visit: 3 lb Starting Weight: 230 lb Total Weight Loss (lbs): 12 lb (5.443 kg) Peak Weight: 230 lb   Body Composition  Body Fat %: 43.5 % Fat Mass (lbs): 95 lbs Muscle Mass (lbs): 117.2 lbs Total Body Water (lbs): 79.2 lbs Visceral Fat Rating : 12   Other Clinical Data RMR: 1814 Fasting: yes Labs: no Today's Visit #: 8 Starting Date: 01/20/24    Chief Complaint: OBESITY    History of Present Illness Joanna Osborn is a 53 year old female who presents for a follow-up on her obesity treatment plan and progress.  She has been adhering to the category one eating plan about thirty percent of the time and is not currently engaging in any exercise. She struggles with portion control and making healthy food choices, particularly with protein and vegetable intake. Her work commitments have disrupted her routine, leading to more frequent dining out and less grocery shopping, which has negatively impacted her dietary habits. She has gained three pounds since her last visit a month ago.  She is being treated for GERD with Prilosec 40 mg, which she takes regularly. If she misses a dose for a day or two, she notices a difference.  Her prediabetes is managed with diet, exercise, weight loss, and metformin  500 mg twice a day. She has difficulty remembering to take the second dose of metformin  later in the day, but she takes all her medications in the morning.  She has a vitamin D  deficiency and is on prescriptive ergocalciferol , 50,000 IU every two weeks. She requests a refill for this medication.  She is managing emotional eating behaviors with Wellbutrin  SR 150 mg daily, which she takes regularly in the morning.       PHYSICAL EXAM:  Blood pressure 124/83, pulse 77, temperature 98.2 F (36.8 C), height 5' 5 (1.651 m), weight 218 lb (98.9 kg), SpO2 96%. Body mass index is 36.28 kg/m.  DIAGNOSTIC DATA REVIEWED:  BMET    Component Value Date/Time   NA 141 05/31/2024 0921   NA 143 01/20/2024 0916   K 3.9 05/31/2024 0921   CL 108 05/31/2024 0921   CO2 23 05/31/2024 0921   GLUCOSE 143 (H) 05/31/2024 0921   BUN 19 05/31/2024 0921   BUN 23 01/20/2024 0916   CREATININE 0.85 05/31/2024 0921   CALCIUM 10.0 05/31/2024 0921   GFRNONAA >60 05/31/2024 0921   GFRAA >60 04/29/2018 2023   Lab Results  Component Value Date   HGBA1C 6.3 (H) 01/20/2024   HGBA1C 4.8 09/13/2021   Lab Results  Component Value Date   INSULIN  38.1 (H) 01/20/2024   Lab Results  Component Value Date   TSH 1.210 01/20/2024   CBC    Component Value Date/Time   WBC 6.7 05/31/2024 0921   WBC 5.3 02/24/2022 1117   RBC 4.57 05/31/2024 0921   HGB 13.0 05/31/2024 0921   HGB 13.0 01/20/2024 0916   HCT 39.9 05/31/2024 0921   HCT 40.3 01/20/2024 0916   PLT 405 (H) 05/31/2024 0921   PLT 364 01/20/2024 0916   MCV 87.3 05/31/2024 0921   MCV 89 01/20/2024 0916   MCH 28.4 05/31/2024 0921   MCHC 32.6 05/31/2024 0921   RDW 13.3 05/31/2024 0921  RDW 13.3 01/20/2024 0916   Iron Studies No results found for: IRON, TIBC, FERRITIN, IRONPCTSAT Lipid Panel     Component Value Date/Time   CHOL 168 01/20/2024 0916   TRIG 96 01/20/2024 0916   HDL 46 01/20/2024 0916   LDLCALC 104 (H) 01/20/2024 0916   Hepatic Function Panel     Component Value Date/Time   PROT 7.4 05/31/2024 0921   PROT 7.2 01/20/2024 0916   ALBUMIN 4.5 05/31/2024 0921   ALBUMIN 4.6 01/20/2024 0916   AST 18 05/31/2024 0921   ALT 19 05/31/2024 0921   ALKPHOS 79 05/31/2024 0921   BILITOT 0.8 05/31/2024 0921      Component Value Date/Time   TSH 1.210 01/20/2024 0916   Nutritional Lab Results  Component Value Date   VD25OH 82.9 01/20/2024      Assessment and Plan Assessment & Plan Obesity Management is ongoing with a focus on dietary changes and portion control. She has gained three pounds since the last visit, attributed to increased eating out and decreased grocery shopping due to a change in routine. She is following the category one eating plan about 30% of the time and struggles with protein and vegetable intake. She is not currently exercising. Strategies for maintaining dietary habits during travel and routine changes were discussed. - Continue category one eating plan with emphasis on portion control and smart choices. - Encouraged increased protein and vegetable intake. - Advised on strategies for maintaining dietary habits during travel and routine changes. - Scheduled follow-up appointments for January and February.  Prediabetes Managed with diet, exercise, weight loss, and metformin . She is on metformin  500 mg twice daily and requests a refill. She is struggling with the second dose of metformin  but is willing to take two doses in the morning to avoid skipping the dose. Discussed the option of taking two doses in the morning if no GI issues are present, with the risk of potential GI issues if sensitive to the medication. - Continue metformin  500 mg twice daily, with option to take two doses in the morning if no GI issues are present. - Refilled metformin  prescription. - Continue diet, exercise and weight loss as discussed today as an important part of the treatment plan   Gastroesophageal reflux disease GERD is managed with Prilosec 40 mg. She reports that heartburn is well-controlled, but noticeable if a dose is missed. - Continue Prilosec 40 mg daily. - Refilled Prilosec prescription.  Vitamin D  deficiency Managed with ergocalciferol  50,000 IU every two weeks. She requests a refill. - Continue ergocalciferol  50,000 IU every two weeks. - Refilled ergocalciferol  prescription.  Emotional eating  behaviors Managed with Wellbutrin  SR 150 mg daily, stable. - Continue Wellbutrin  SR 150 mg daily. - Refilled Wellbutrin  SR prescription.      Patients who are on anti-obesity medications are counseled on the importance of maintaining healthy lifestyle habits, including balanced nutrition, regular physical activity, and behavioral modifications,  Medication is an adjunct to, not a replacement for, lifestyle changes and that the long-term success and weight maintenance depend on continued adherence to these strategies.   [place] was informed of the importance of frequent follow up visits to maximize her success with intensive lifestyle modifications for her obesity and obesity related health conditions as recommended by USPSTF and CMS guidelines  Louann Penton, MD

## 2024-07-05 ENCOUNTER — Ambulatory Visit (HOSPITAL_BASED_OUTPATIENT_CLINIC_OR_DEPARTMENT_OTHER)
Admission: RE | Admit: 2024-07-05 | Discharge: 2024-07-05 | Disposition: A | Discharge status: Home / Self Care | Source: Ambulatory Visit | Attending: Hematology | Admitting: Hematology

## 2024-07-05 DIAGNOSIS — Z780 Asymptomatic menopausal state: Secondary | ICD-10-CM | POA: Diagnosis not present

## 2024-07-05 DIAGNOSIS — E2839 Other primary ovarian failure: Secondary | ICD-10-CM | POA: Insufficient documentation

## 2024-07-09 ENCOUNTER — Encounter: Payer: Self-pay | Admitting: Hematology

## 2024-07-13 ENCOUNTER — Encounter (HOSPITAL_COMMUNITY): Payer: Self-pay | Admitting: Gastroenterology

## 2024-07-17 ENCOUNTER — Telehealth: Payer: Self-pay | Admitting: Gastroenterology

## 2024-07-17 NOTE — Telephone Encounter (Addendum)
 Procedure:Upper EUS Procedure date: 07/24/24 Procedure location: [facility] Arrival Time: 6:30 am Spoke with the patient Y/N:   No, called [phone] and couldn't leave a message because the mailbox is full, Mychart message sent. Yes, 07/18/24 @ 2:21 pm  Any prep concerns? No  Has the patient obtained the prep from the pharmacy ? No prep needed Do you have a care partner and transportation: Yes Any additional concerns? No

## 2024-07-24 ENCOUNTER — Other Ambulatory Visit: Payer: Self-pay

## 2024-07-24 ENCOUNTER — Encounter (HOSPITAL_COMMUNITY): Payer: Self-pay | Admitting: Gastroenterology

## 2024-07-24 ENCOUNTER — Ambulatory Visit (HOSPITAL_COMMUNITY)
Admission: RE | Admit: 2024-07-24 | Discharge: 2024-07-24 | Disposition: A | Discharge status: Home / Self Care | Attending: Gastroenterology | Admitting: Gastroenterology

## 2024-07-24 ENCOUNTER — Ambulatory Visit (HOSPITAL_COMMUNITY): Admitting: Anesthesiology

## 2024-07-24 ENCOUNTER — Encounter (HOSPITAL_COMMUNITY)
Admission: RE | Disposition: A | Discharge status: Home / Self Care | Payer: Self-pay | Source: Home / Self Care | Attending: Gastroenterology

## 2024-07-24 DIAGNOSIS — Z9221 Personal history of antineoplastic chemotherapy: Secondary | ICD-10-CM | POA: Diagnosis not present

## 2024-07-24 DIAGNOSIS — K2950 Unspecified chronic gastritis without bleeding: Secondary | ICD-10-CM

## 2024-07-24 DIAGNOSIS — K219 Gastro-esophageal reflux disease without esophagitis: Secondary | ICD-10-CM | POA: Insufficient documentation

## 2024-07-24 DIAGNOSIS — K8689 Other specified diseases of pancreas: Secondary | ICD-10-CM | POA: Diagnosis not present

## 2024-07-24 DIAGNOSIS — F419 Anxiety disorder, unspecified: Secondary | ICD-10-CM | POA: Insufficient documentation

## 2024-07-24 DIAGNOSIS — Z923 Personal history of irradiation: Secondary | ICD-10-CM | POA: Diagnosis not present

## 2024-07-24 DIAGNOSIS — K3189 Other diseases of stomach and duodenum: Secondary | ICD-10-CM | POA: Insufficient documentation

## 2024-07-24 DIAGNOSIS — K449 Diaphragmatic hernia without obstruction or gangrene: Secondary | ICD-10-CM | POA: Insufficient documentation

## 2024-07-24 DIAGNOSIS — K2289 Other specified disease of esophagus: Secondary | ICD-10-CM

## 2024-07-24 DIAGNOSIS — K862 Cyst of pancreas: Secondary | ICD-10-CM | POA: Diagnosis not present

## 2024-07-24 DIAGNOSIS — I10 Essential (primary) hypertension: Secondary | ICD-10-CM | POA: Insufficient documentation

## 2024-07-24 DIAGNOSIS — K869 Disease of pancreas, unspecified: Secondary | ICD-10-CM

## 2024-07-24 DIAGNOSIS — I899 Noninfective disorder of lymphatic vessels and lymph nodes, unspecified: Secondary | ICD-10-CM

## 2024-07-24 DIAGNOSIS — F32A Depression, unspecified: Secondary | ICD-10-CM | POA: Diagnosis not present

## 2024-07-24 DIAGNOSIS — D649 Anemia, unspecified: Secondary | ICD-10-CM | POA: Diagnosis not present

## 2024-07-24 DIAGNOSIS — K2970 Gastritis, unspecified, without bleeding: Secondary | ICD-10-CM

## 2024-07-24 HISTORY — DX: Prediabetes: R73.03

## 2024-07-24 HISTORY — PX: ESOPHAGOGASTRODUODENOSCOPY: SHX5428

## 2024-07-24 HISTORY — PX: FINE NEEDLE ASPIRATION: SHX6590

## 2024-07-24 HISTORY — PX: BIOPSY OF SKIN SUBCUTANEOUS TISSUE AND/OR MUCOUS MEMBRANE: SHX6741

## 2024-07-24 HISTORY — PX: EUS: SHX5427

## 2024-07-24 LAB — GLUCOSE, CAPILLARY: Glucose-Capillary: 126 mg/dL — ABNORMAL HIGH (ref 70–99)

## 2024-07-24 SURGERY — EGD (ESOPHAGOGASTRODUODENOSCOPY)
Anesthesia: Monitor Anesthesia Care

## 2024-07-24 MED ORDER — PROPOFOL 10 MG/ML IV BOLUS
INTRAVENOUS | Status: DC | PRN
  Administered 2024-07-24: 50 mg via INTRAVENOUS
  Administered 2024-07-24: 125 ug/kg/min via INTRAVENOUS
  Administered 2024-07-24: 70 mg via INTRAVENOUS
  Administered 2024-07-24: 50 mg via INTRAVENOUS
  Administered 2024-07-24: 40 mg via INTRAVENOUS

## 2024-07-24 MED ORDER — CIPROFLOXACIN IN D5W 400 MG/200ML IV SOLN
INTRAVENOUS | Status: AC
  Filled 2024-07-24: qty 200

## 2024-07-24 MED ORDER — CIPROFLOXACIN IN D5W 400 MG/200ML IV SOLN
INTRAVENOUS | Status: DC | PRN
  Administered 2024-07-24: 400 mg via INTRAVENOUS

## 2024-07-24 MED ORDER — SODIUM CHLORIDE 0.9 % IV SOLN: INTRAVENOUS | Status: DC

## 2024-07-24 MED ORDER — CIPROFLOXACIN HCL 500 MG PO TABS: 500.0000 mg | ORAL_TABLET | Freq: Two times a day (BID) | ORAL | 0 refills | Status: AC

## 2024-07-24 MED ORDER — LIDOCAINE 2% (20 MG/ML) 5 ML SYRINGE
INTRAMUSCULAR | Status: DC | PRN
  Administered 2024-07-24: 100 mg via INTRAVENOUS

## 2024-07-24 MED ORDER — ONDANSETRON HCL 4 MG/2ML IJ SOLN
INTRAMUSCULAR | Status: DC | PRN
  Administered 2024-07-24: 4 mg via INTRAVENOUS

## 2024-07-24 MED ORDER — PROPOFOL 500 MG/50ML IV EMUL
INTRAVENOUS | Status: AC
  Filled 2024-07-24: qty 50

## 2024-07-24 MED ORDER — LACTATED RINGERS IV SOLN
INTRAVENOUS | Status: AC | PRN
  Administered 2024-07-24: 1000 mL via INTRAVENOUS

## 2024-07-24 NOTE — H&P (Signed)
 "  GASTROENTEROLOGY PROCEDURE H&P NOTE   Primary Care Physician: Alvera Reagin, PA  HPI: Joanna Osborn is a 53 y.o. female who presents for EGD/EUS to evaluate pancreatic cyst (slightly enlarging) on repeat imaging.  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   Malignant neoplasm of upper-outer quadrant of left breast in female, 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   oncologist--- dr lanny;  Stage IB, IDC, ER/ PR/ HER2 +;   chemo 04-04-2021  to 07-26-2021 and started 08-14-2021;  raditation completed 11-18-2021   Port-A-Cath in place 04/02/2021   Pre-diabetes    Prediabetes    Wears glasses    Past Surgical History:  Procedure Laterality Date   BIOPSY  02/19/2022   Procedure: BIOPSY;  Surgeon: Wilhelmenia Aloha Raddle., MD;  Location: [facility] END[facility]COPY;  Service: Gastroenterology;;   BREAST LUMPECTOMY WITH RADIOACTIVE SEED AND SENTINEL LYMPH NODE BIOPSY Left 08/25/2021   Procedure: LEFT BREAST LUMPECTOMY WITH RADIOACTIVE SEED AND AXILLARY SENTINEL LYMPH NODE BIOPSY;  Surgeon: Ebbie Cough, MD;  Location: [facility];  Service: General;  Laterality: Left;   CARPAL TUNNEL RELEASE Right 08/17/2008   @[facility]  by dr murrell;   and excision wrist ganglian cyst   CHOLECYSTECTOMY, LAPAROSCOPIC  06/27/2002   @[facility]  by dr adel   COLONOSCOPY WITH PROPOFOL  N/A 03/15/2023   Procedure: COLONOSCOPY WITH PROPOFOL ;  Surgeon: Wilhelmenia Aloha Raddle., MD;  Location: [facility];   Service: Gastroenterology;  Laterality: N/A;   ESOPHAGOGASTRODUODENOSCOPY (EGD) WITH PROPOFOL  N/A 02/19/2022   Procedure: ESOPHAGOGASTRODUODENOSCOPY (EGD) WITH PROPOFOL ;  Surgeon: Wilhelmenia Aloha Raddle., MD;  Location: [facility] END[facility]COPY;  Service: Gastroenterology;  Laterality: N/A;   ESOPHAGOGASTRODUODENOSCOPY (EGD) WITH PROPOFOL  N/A 07/13/2022   Procedure: ESOPHAGOGASTRODUODENOSCOPY (EGD) WITH PROPOFOL ;  Surgeon: Wilhelmenia Aloha Raddle., MD;  Location: [facility]COPY;  Service: Gastroenterology;  Laterality: N/A;   EUS N/A 02/19/2022   Procedure: UPPER ENDOSCOPIC ULTRASOUND (EUS) RADIAL;  Surgeon: Wilhelmenia Aloha Raddle., MD;  Location: [facility];  Service: Gastroenterology;  Laterality: N/A;   FINE NEEDLE ASPIRATION  02/19/2022   Procedure: FINE NEEDLE ASPIRATION (FNA) LINEAR;  Surgeon: Wilhelmenia Aloha Raddle., MD;  Location: [facility]COPY;  Service: Gastroenterology;;   HYSTEROSCOPY WITH D & C  11/21/2007   @[facility]  by dr latisha;  polypectomy   INSERTION OF MESH N/A 10/28/2016   Procedure: INSERTION OF MESH;  Surgeon: Mitzie DELENA Freund, MD;  Location: [facility];  Service: General;  Laterality: N/A;   LAPAROSCOPIC SALPINGO OOPHERECTOMY Bilateral 02/02/2022   Procedure: LAPAROSCOPIC BILATERAL SALPINGO OOPHORECTOMY;  Surgeon: Johnnye Ade, MD;  Location: [facility];  Service: Gynecology;  Laterality: Bilateral;   OOPHORECTOMY     PORT-A-CATH REMOVAL Right 08/24/2022   Procedure: REMOVAL PORT-A-CATH;  Surgeon: Ebbie Cough, MD;  Location: [facility];  Service: General;  Laterality: Right;   PORTACATH PLACEMENT Right 04/02/2021   Procedure: INSERTION PORT-A-CATH;  Surgeon: Ebbie Cough, MD;  Location: [facility];  Service: General;  Laterality: Right;   TUBAL LIGATION  1999   VENTRAL HERNIA REPAIR N/A 10/28/2016   Procedure: LAPAROSCOPIC VENTRAL HERNIA REPAIR;  Surgeon: Mitzie DELENA Freund, MD;  Location: [facility] OR;  Service: General;  Laterality: N/A;    Current Facility-Administered Medications  Medication Dose Route Frequency Provider Last Rate Last Admin   0.9 %  sodium chloride  infusion   Intravenous Continuous Mansouraty, Aloha Raddle., MD       lactated ringers  infusion    Continuous PRN Mansouraty, Aloha Raddle., MD 20 mL/hr at 07/24/24 0713 1,000 mL at 07/24/24 9286   Current Medications[1] Allergies[2] Family History  Problem Relation Age of Onset   Hypertension Mother    Diabetes Mother    Kidney disease Mother    Heart disease Father    Hypertension Father    Diabetes Father    Sleep apnea Father    Colon cancer Paternal Aunt    Colon cancer Paternal Grandmother        dx unknown age   Colon cancer Paternal Grandfather        dx after 68   Breast cancer Cousin    Hypertension Other    Cancer Other    Esophageal cancer Neg Hx    Inflammatory bowel disease Neg Hx    Liver disease Neg Hx    Pancreatic cancer Neg Hx    Rectal cancer Neg Hx    Stomach cancer Neg Hx    Social History   Socioeconomic History   Marital status: Married    Spouse name: Not on file   Number of children: 2   Years of education: Not on file   Highest education level: Not on file  Occupational History   Not on file  Tobacco Use   Smoking status: Never   Smokeless tobacco: Never  Vaping Use   Vaping status: Never Used  Substance and Sexual Activity   Alcohol use: Not Currently    Comment: one glass wine twice monthly   Drug use: Never   Sexual activity: Yes    Birth control/protection: None  Other Topics Concern   Not on file  Social History Narrative   Not on file   Social Drivers of Health   Tobacco Use: Low Risk (07/24/2024)   Patient History    Smoking Tobacco Use: Never    Smokeless Tobacco Use: Never    Passive Exposure: Not on file  Financial Resource Strain: Not on file  Food Insecurity: No Food Insecurity (05/31/2024)   Epic    Worried About Programme Researcher, Broadcasting/film/video in the Last Year: Never true    Ran Out of Food in  the Last Year: Never true  Transportation Needs: No Transportation Needs (05/31/2024)   Epic    Lack of Transportation (Medical): No    Lack of Transportation (Non-Medical): No  Physical Activity: Not on file  Stress: Not on file  Social Connections: Not on file  Intimate Partner Violence: Not At Risk (05/31/2024)   Epic    Fear of Current or Ex-Partner: No    Emotionally Abused: No    Physically Abused: No    Sexually Abused: No  Depression (PHQ2-9): Low Risk (05/31/2024)   Depression (PHQ2-9)    PHQ-2 Score: 0  Alcohol Screen: Not on file  Housing: Low Risk (05/31/2024)   Epic    Unable to Pay for Housing in the Last Year: No    Number of Times Moved in  the Last Year: 0    Homeless in the Last Year: No  Utilities: Not At Risk (05/31/2024)   Epic    Threatened with loss of utilities: No  Health Literacy: Not on file    Physical Exam: Today's Vitals   07/24/24 0653  BP: (!) 153/103  Resp: 18  Temp: 98.1 F (36.7 C)  TempSrc: Temporal  SpO2: 96%  Weight: 101.6 kg  Height: 5' 5 (1.651 m)  PainSc: 0-No pain   Body mass index is 37.28 kg/m. GEN: NAD EYE: Sclerae anicteric ENT: MMM CV: Non-tachycardic GI: Soft, NT/ND NEURO:  Alert & Oriented x 3  Lab Results: No results for input(s): WBC, HGB, HCT, PLT in the last 72 hours. BMET No results for input(s): NA, K, CL, CO2, GLUCOSE, BUN, CREATININE, CALCIUM in the last 72 hours. LFT No results for input(s): PROT, ALBUMIN, AST, ALT, ALKPHOS, BILITOT, BILIDIR, IBILI in the last 72 hours. PT/INR No results for input(s): LABPROT, INR in the last 72 hours.   Impression / Plan: This is a 53 y.o.female who presents for EGD/EUS to evaluate pancreatic cyst (slightly enlarging) on repeat imaging.  The risks of an EUS including intestinal perforation, bleeding, infection, aspiration, and medication effects were discussed as was the possibility it may not give a definitive diagnosis if  a biopsy is performed.  When a biopsy of the pancreas is done as part of the EUS, there is an additional risk of pancreatitis at the rate of about 1-2%.  It was explained that procedure related pancreatitis is typically mild, although it can be severe and even life threatening, which is why we do not perform random pancreatic biopsies and only biopsy a lesion/area we feel is concerning enough to warrant the risk.   The risks and benefits of endoscopic evaluation/treatment were discussed with the patient and/or family; these include but are not limited to the risk of perforation, infection, bleeding, missed lesions, lack of diagnosis, severe illness requiring hospitalization, as well as anesthesia and sedation related illnesses.  The patient's history has been reviewed, patient examined, no change in status, and deemed stable for procedure.  The patient and/or family was provided an opportunity to ask questions and all were answered.  The patient and/or family is agreeable to proceed.    Aloha Finner, MD [facility] Office # [phone]     [1]  Current Facility-Administered Medications:    0.9 %  sodium chloride  infusion, , Intravenous, Continuous, Mansouraty, Aloha Raddle., MD   lactated ringers  infusion, , , Continuous PRN, Mansouraty, Aloha Raddle., MD, Last Rate: 20 mL/hr at 07/24/24 0713, 1,000 mL at 07/24/24 0713 [2]  Allergies Allergen Reactions   Endocet [Oxycodone -Acetaminophen ] Itching   Propoxyphene Itching and Other (See Comments)    Darvocet*   Tape Rash   "

## 2024-07-24 NOTE — Anesthesia Postprocedure Evaluation (Signed)
"   Anesthesia Post Note  Patient: Joanna Osborn  Procedure(s) Performed: ULTRASOUND, UPPER GI TRACT, ENDOSCOPIC EGD (ESOPHAGOGASTRODUODENOSCOPY) BIOPSY, SKIN, SUBCUTANEOUS TISSUE, OR MUCOUS MEMBRANE FINE NEEDLE ASPIRATION     Patient location during evaluation: PACU Anesthesia Type: MAC Level of consciousness: awake and alert Pain management: pain level controlled Vital Signs Assessment: post-procedure vital signs reviewed and stable Respiratory status: spontaneous breathing, nonlabored ventilation, respiratory function stable and patient connected to nasal cannula oxygen Cardiovascular status: stable and blood pressure returned to baseline Postop Assessment: no apparent nausea or vomiting Anesthetic complications: no   No notable events documented.  Last Vitals:  Vitals:   07/24/24 0920 07/24/24 0930  BP: (!) 192/90 135/78  Pulse: 80 79  Resp: (!) 22 (!) 21  Temp:    SpO2: 98% 99%    Last Pain:  Vitals:   07/24/24 0910  TempSrc:   PainSc: 0-No pain                 Lynwood MARLA Cornea      "

## 2024-07-24 NOTE — Anesthesia Preprocedure Evaluation (Signed)
"                                    Anesthesia Evaluation  Patient identified by MRN, date of birth, ID band Patient awake    Reviewed: Allergy & Precautions, NPO status   History of Anesthesia Complications Negative for: history of anesthetic complications  Airway Mallampati: II  TM Distance: >3 FB     Dental no notable dental hx.    Pulmonary neg COPD   breath sounds clear to auscultation       Cardiovascular hypertension, (-) angina (-) CAD and (-) Past MI  Rhythm:Regular Rate:Normal     Neuro/Psych neg Seizures PSYCHIATRIC DISORDERS Anxiety Depression     Neuromuscular disease    GI/Hepatic ,GERD  ,,(+) neg Cirrhosis        Endo/Other    Renal/GU Renal disease     Musculoskeletal   Abdominal   Peds  Hematology  (+) Blood dyscrasia, anemia   Anesthesia Other Findings   Reproductive/Obstetrics                              Anesthesia Physical Anesthesia Plan  ASA: 2  Anesthesia Plan: MAC   Post-op Pain Management:    Induction: Intravenous  PONV Risk Score and Plan: 2 and Ondansetron  and Propofol  infusion  Airway Management Planned: Natural Airway and Simple Face Mask  Additional Equipment:   Intra-op Plan:   Post-operative Plan:   Informed Consent:      Dental advisory given  Plan Discussed with: CRNA  Anesthesia Plan Comments:         Anesthesia Quick Evaluation  "

## 2024-07-24 NOTE — Anesthesia Procedure Notes (Signed)
 Date/Time: 07/24/2024 8:26 AM  Performed by: Para Jerelene CROME, CRNAOxygen Delivery Method: Simple face mask Comments: POM Face Mask.

## 2024-07-24 NOTE — Discharge Instructions (Signed)

## 2024-07-24 NOTE — Transfer of Care (Addendum)
 Immediate Anesthesia Transfer of Care Note  Patient: Joanna Osborn  Procedure(s) Performed: ULTRASOUND, UPPER GI TRACT, ENDOSCOPIC EGD (ESOPHAGOGASTRODUODENOSCOPY) BIOPSY, SKIN, SUBCUTANEOUS TISSUE, OR MUCOUS MEMBRANE FINE NEEDLE ASPIRATION  Patient Location: Endoscopy Unit  Anesthesia Type:MAC  Level of Consciousness: drowsy and patient cooperative  Airway & Oxygen Therapy: Patient Spontanous Breathing and Patient connected to face mask oxygen  Post-op Assessment: Report given to RN and Post -op Vital signs reviewed and stable  Post vital signs: Reviewed and stable  Last Vitals:  Vitals Value Taken Time  BP 192/95 07/24/24 09:04  Temp 36.6 07/24/24   09:05  Pulse 86 07/24/24 09:05  Resp 26 07/24/24 09:05  SpO2 94 % 07/24/24 09:05  Vitals shown include unfiled device data.  Last Pain:  Vitals:   07/24/24 0653  TempSrc: Temporal  PainSc: 0-No pain      Patients Stated Pain Goal: 0 (07/24/24 9346)  Complications: No notable events documented.

## 2024-07-24 NOTE — Op Note (Addendum)
 [facility] Patient Name: Joanna Osborn Procedure Date: 07/24/2024 MRN: [id number] Attending MD: Aloha Finner , MD, [id number] Date of Birth: Mar 01, 1971 CSN: [id number] Age: 53 Admit Type: Outpatient Procedure:                Upper EUS Indications:              Pancreatic cyst on MRCP Providers:                Aloha Finner, MD, Randall Lines, RN, Fairy Marina, Technician Referring MD:              Medicines:                Monitored Anesthesia Care, Cipro  400 mg IV Complications:            No immediate complications. Estimated Blood Loss:     Estimated blood loss was minimal. Procedure:                Pre-Anesthesia Assessment:                           - Prior to the procedure, a History and Physical                            was performed, and patient medications and                            allergies were reviewed. The patient's tolerance of                            previous anesthesia was also reviewed. The risks                            and benefits of the procedure and the sedation                            options and risks were discussed with the patient.                            All questions were answered, and informed consent                            was obtained. [Anticoagulant Agents] [Ijbd Prior to                            Procedure]. [ASA Grade]. After reviewing the risks                            and benefits, the patient was deemed in                            satisfactory condition to undergo the procedure.  After obtaining informed consent, the endoscope was                            passed under direct vision. Throughout the                            procedure, the patient's blood pressure, pulse, and                            oxygen saturations were monitored continuously. The                            [id number]-[id number] ([id number]) Olympus endoscope was introduced                             through the mouth, and advanced to the second part                            of duodenum. The [id number]-[id number] ([id number]) Olympus                            duodenoscope was introduced through the mouth, and                            advanced to the area of papilla. The [id number]                            ([id number]) Olympus endosonoscope was introduced                            through the mouth, and advanced to the duodenum for                            ultrasound examination from the esophagus, stomach                            and duodenum. The upper EUS was accomplished                            without difficulty. The patient tolerated the                            procedure. Scope In: Scope Out: Findings:      ENDOSCOPIC FINDING: :      No gross lesions were noted in the entire esophagus.      The Z-line was irregular and was found 36 cm from the incisors.      A 2 cm hiatal hernia was present.      Patchy mildly erythematous mucosa without bleeding was found in the       gastric antrum.      No othergross lesions were noted in the entire examined stomach.       Biopsies were taken with a cold forceps for histology and Helicobacter       pylori testing.      No  gross lesions were noted in the duodenal bulb, in the first portion       of the duodenum and in the second portion of the duodenum.      The major papilla was normal (hidden under a hood).      ENDOSONOGRAPHIC FINDING: :      A rounded mass was identified in the pancreatic head. The mass was       isoechoic, and mixed solid and multicystic. The mass measured 28 mm by       28 mm in maximal cross-sectional diameter. The endosonographic borders       were well-defined. An intact interface was seen between the mass and the       superior mesenteric artery and celiac trunk suggesting a lack of       invasion. The remainder of the pancreas was examined. The       endosonographic appearance of parenchyma and  the upstream pancreatic       duct indicated no duct dilation (PDH - 1.9 mm, PDN - 2.3 mm, PDB- 1.3       mm, PDT - 0.6 mm), no ductal or parenchymal calcifications and no       parenchymal atrophy but some hyperechoic stranding was noted. Fine       needle biopsy was performed of the lesion (no cystic component large       enough for aspiration). Color Doppler imaging was utilized prior to       needle puncture to confirm a lack of significant vascular structures       within the needle path. Five passes were made with the 22 gauge Acquire       biopsy needle using a transduodenal approach. A visible core of tissue       was obtained. Preliminary cytologic examination and touch preps were       performed. Final cytology results are pending.      There was no sign of significant endosonographic abnormality in the       common bile duct and in the common hepatic duct. Ducts with regular       contour were identified without stones or sludge.      Endosonographic imaging of the ampulla showed no intramural       (subepithelial) lesion.      No malignant-appearing lymph nodes were visualized in the celiac region       (level 20), peripancreatic region and porta hepatis region.      Endosonographic imaging in the visualized portion of the liver showed no       mass.      Endosonographic imaging in the gastroesophageal junction showed no       intramural (subepithelial) lesion.      The celiac region was visualized.      The esophagus, stomach and duodenum were examined endosonographically. Impression:               EGD Impression:                           - No gross lesions in the entire esophagus. Z-line                            irregular, 36 cm from the incisors.                           -  2 cm hiatal hernia.                           - Erythematous mucosa in the antrum. No other gross                            lesions in the entire stomach. Biopsied.                           - No  gross lesions in the duodenal bulb, in the                            first portion of the duodenum and in the second                            portion of the duodenum.                           - Normal major papilla (under a hood).                           EUS Impression:                           - A solid-multicystic isoechoic mass was identified                            in the pancreatic head. Cytology results are                            pending. However, the endosonographic appearance is                            suggestive of a serous cystadenoma more than SPN or                            other lesion. Fine needle biopsy performed (no                            cystic component large enough for aspiration                            solely).                           - There was no sign of significant pathology in the                            common bile duct and in the common hepatic duct.                           - No malignant-appearing lymph nodes were                            visualized  in the celiac region (level 20),                            peripancreatic region and porta hepatis region. Moderate Sedation:      Not Applicable - Patient had care per Anesthesia. Recommendation:           - The patient will be observed post-procedure,                            until all discharge criteria are met.                           - Discharge patient to home.                           - Patient has a contact number available for                            emergencies. The signs and symptoms of potential                            delayed complications were discussed with the                            patient. Return to normal activities tomorrow.                            Written discharge instructions were provided to the                            patient.                           - Low fat diet.                           - Observe patient's clinical course.                            - Monitor for signs/symptoms of pancreatitis,                            bleeding, perforation, and infection. If issues                            please call our number to get further assistance as                            needed.                           - Await cytology results and await path results.                           - Ciprofloxacin  500 mg twice daily for 3-days.                           -  Continue present medications otherwise.                           - Followup to be recommended based on results.                           - The findings and recommendations were discussed                            with the patient.                           - The findings and recommendations were discussed                            with the patient's family. Procedure Code(s):        --- Professional ---                           [phone], Esophagogastroduodenoscopy, flexible,                            transoral; with transendoscopic ultrasound-guided                            intramural or transmural fine needle                            aspiration/biopsy(s) (includes endoscopic                            ultrasound examination of the esophagus, stomach,                            and either the duodenum or a surgically altered                            stomach where the jejunum is examined distal to the                            anastomosis)                           43239, 59, Esophagogastroduodenoscopy, flexible,                            transoral; with biopsy, single or multiple Diagnosis Code(s):        --- Professional ---                           K22.89, Other specified disease of esophagus                           K44.9, Diaphragmatic hernia without obstruction or                            gangrene  K31.89, Other diseases of stomach and duodenum                           K86.89, Other specified diseases of pancreas                            I89.9, Noninfective disorder of lymphatic vessels                            and lymph nodes, unspecified                           K86.2, Cyst of pancreas CPT copyright 2022 [employer]. All rights reserved. The codes documented in this report are preliminary and upon coder review may  be revised to meet current compliance requirements. Aloha Finner, MD 07/24/2024 9:09:22 AM Number of Addenda: 0

## 2024-07-25 ENCOUNTER — Ambulatory Visit: Payer: Self-pay | Admitting: Gastroenterology

## 2024-07-25 ENCOUNTER — Encounter (HOSPITAL_COMMUNITY): Payer: Self-pay | Admitting: Gastroenterology

## 2024-07-25 LAB — CYTOLOGY - NON PAP

## 2024-07-26 LAB — SURGICAL PATHOLOGY

## 2024-07-31 ENCOUNTER — Ambulatory Visit (INDEPENDENT_AMBULATORY_CARE_PROVIDER_SITE_OTHER): Admitting: Family Medicine

## 2024-07-31 ENCOUNTER — Encounter (INDEPENDENT_AMBULATORY_CARE_PROVIDER_SITE_OTHER): Payer: Self-pay | Admitting: Family Medicine

## 2024-07-31 VITALS — BP 118/82 | HR 83 | Temp 98.1°F | Ht 65.0 in | Wt 212.0 lb

## 2024-07-31 DIAGNOSIS — F5089 Other specified eating disorder: Secondary | ICD-10-CM | POA: Diagnosis not present

## 2024-07-31 DIAGNOSIS — K219 Gastro-esophageal reflux disease without esophagitis: Secondary | ICD-10-CM | POA: Diagnosis not present

## 2024-07-31 DIAGNOSIS — R7303 Prediabetes: Secondary | ICD-10-CM

## 2024-07-31 DIAGNOSIS — E559 Vitamin D deficiency, unspecified: Secondary | ICD-10-CM | POA: Diagnosis not present

## 2024-07-31 DIAGNOSIS — F3289 Other specified depressive episodes: Secondary | ICD-10-CM

## 2024-07-31 DIAGNOSIS — Z6835 Body mass index (BMI) 35.0-35.9, adult: Secondary | ICD-10-CM | POA: Diagnosis not present

## 2024-07-31 DIAGNOSIS — E669 Obesity, unspecified: Secondary | ICD-10-CM

## 2024-07-31 DIAGNOSIS — Z6836 Body mass index (BMI) 36.0-36.9, adult: Secondary | ICD-10-CM

## 2024-07-31 MED ORDER — OMEPRAZOLE 40 MG PO CPDR: 40.0000 mg | DELAYED_RELEASE_CAPSULE | Freq: Every day | ORAL | 0 refills | Status: AC

## 2024-07-31 MED ORDER — BUPROPION HCL ER (SR) 150 MG PO TB12: 150.0000 mg | ORAL_TABLET | Freq: Every day | ORAL | 0 refills | Status: AC

## 2024-07-31 MED ORDER — METFORMIN HCL 500 MG PO TABS: 500.0000 mg | ORAL_TABLET | Freq: Two times a day (BID) | ORAL | 0 refills | Status: AC

## 2024-07-31 MED ORDER — VITAMIN D (ERGOCALCIFEROL) 1.25 MG (50000 UNIT) PO CAPS: ORAL_CAPSULE | ORAL | 0 refills | Status: AC

## 2024-07-31 NOTE — Progress Notes (Signed)
 "  Office: [phone]  /  Fax: [phone]  WEIGHT SUMMARY AND BIOMETRICS  Anthropometric Measurements Height: 5' 5 (1.651 m) Weight: 212 lb (96.2 kg) BMI (Calculated): 35.28 Weight at Last Visit: 218 lb Weight Lost Since Last Visit: 6 lb Weight Gained Since Last Visit: 0 Starting Weight: 230 lb Total Weight Loss (lbs): 18 lb (8.165 kg)   Body Composition  Body Fat %: 40.9 % Fat Mass (lbs): 86.8 lbs Muscle Mass (lbs): 118.8 lbs Total Body Water (lbs): 75 lbs Visceral Fat Rating : 11   Other Clinical Data Fasting: yes Labs: no Today's Visit #: 9 Starting Date: 01/20/24    Chief Complaint: OBESITY   Discussed the use of AI scribe software for clinical note transcription with the patient, who gave verbal consent to proceed.  History of Present Illness Joanna Osborn is a 54 year old female with obesity and prediabetes who presents for obesity treatment and progress assessment.  She is on a category one eating plan but has struggled with adherence. Despite this, she has lost six pounds over the past month, attributed to increased physical activity during a recent trip to [place] . She is not currently engaging in regular exercise.  She is being treated for prediabetes with metformin  500 mg twice daily and is actively working on improving her diet, exercise, and weight loss to manage her glucose levels. She requests a refill of her medication.  She has a vitamin D  deficiency and is on prescription ergocalciferol  50,000 IU every 14 days, for which she requests a refill.  She has gastroesophageal reflux disease (GERD) and is on Prilosec 40 mg daily. She is focusing on weight loss and requests a refill of her medication.  She experiences emotional eating behaviors and is being treated with Wellbutrin  SR 150 mg daily. She is working on being mindful of her eating habits and requests a refill of her medication.  She recently underwent a biopsy that revealed gastritis but was  negative for malignant cells. She inquired about the condition and its potential causes.      PHYSICAL EXAM:  Blood pressure 118/82, pulse 83, temperature 98.1 F (36.7 C), height 5' 5 (1.651 m), weight 212 lb (96.2 kg), SpO2 97%. Body mass index is 35.28 kg/m.  DIAGNOSTIC DATA REVIEWED:  BMET    Component Value Date/Time   NA 141 05/31/2024 0921   NA 143 01/20/2024 0916   K 3.9 05/31/2024 0921   CL 108 05/31/2024 0921   CO2 23 05/31/2024 0921   GLUCOSE 143 (H) 05/31/2024 0921   BUN 19 05/31/2024 0921   BUN 23 01/20/2024 0916   CREATININE 0.85 05/31/2024 0921   CALCIUM 10.0 05/31/2024 0921   GFRNONAA >60 05/31/2024 0921   GFRAA >60 04/29/2018 2023   Lab Results  Component Value Date   HGBA1C 6.3 (H) 01/20/2024   HGBA1C 4.8 09/13/2021   Lab Results  Component Value Date   INSULIN  38.1 (H) 01/20/2024   Lab Results  Component Value Date   TSH 1.210 01/20/2024   CBC    Component Value Date/Time   WBC 6.7 05/31/2024 0921   WBC 5.3 02/24/2022 1117   RBC 4.57 05/31/2024 0921   HGB 13.0 05/31/2024 0921   HGB 13.0 01/20/2024 0916   HCT 39.9 05/31/2024 0921   HCT 40.3 01/20/2024 0916   PLT 405 (H) 05/31/2024 0921   PLT 364 01/20/2024 0916   MCV 87.3 05/31/2024 0921   MCV 89 01/20/2024 0916   MCH 28.4 05/31/2024 9078  MCHC 32.6 05/31/2024 0921   RDW 13.3 05/31/2024 0921   RDW 13.3 01/20/2024 0916   Iron Studies No results found for: IRON, TIBC, FERRITIN, IRONPCTSAT Lipid Panel     Component Value Date/Time   CHOL 168 01/20/2024 0916   TRIG 96 01/20/2024 0916   HDL 46 01/20/2024 0916   LDLCALC 104 (H) 01/20/2024 0916   Hepatic Function Panel     Component Value Date/Time   PROT 7.4 05/31/2024 0921   PROT 7.2 01/20/2024 0916   ALBUMIN 4.5 05/31/2024 0921   ALBUMIN 4.6 01/20/2024 0916   AST 18 05/31/2024 0921   ALT 19 05/31/2024 0921   ALKPHOS 79 05/31/2024 0921   BILITOT 0.8 05/31/2024 0921      Component Value Date/Time   TSH 1.210  01/20/2024 0916   Nutritional Lab Results  Component Value Date   VD25OH 82.9 01/20/2024     Assessment and Plan Assessment & Plan Obesity Management is ongoing with a focus on weight loss. She has lost six pounds in the last month despite not closely following the category one eating plan and not exercising regularly. The weight loss is attributed to increased physical activity during a recent trip to [place] . - Encouraged continuation of physical activity, aiming for 150 minutes of moderate exercise per week. - Provided structured Category 1 eating plan with flexibility for dinner recipe choices, ensuring dinner calories are between 300-450 and protein intake is at least 30 grams. (Meal prep handout given)  Prediabetes Managed with metformin  500 mg twice daily. She is also working on diet, exercise, and weight loss to improve glucose levels. - Continue metformin  500 mg twice daily. - Encouraged adherence to diet and exercise regimen to improve glucose levels.  Vitamin D  deficiency Managed with ergocalciferol  50,000 IU every 14 days. - Continue ergocalciferol  50,000 IU every 14 days.  Gastroesophageal reflux disease GERD is managed with Prilosec 40 mg daily. Recent biopsy showed gastritis, likely due to inflammation of the stomach lining. She is already on Prilosec, which is beneficial for managing GERD symptoms. - Continue Prilosec 40 mg daily.  Depression with emotional eating behaviors Managed with Wellbutrin  SR 150 mg daily. She is working on being mindful of emotional eating. - Continue Wellbutrin  SR 150 mg daily.      Patients who are on anti-obesity medications are counseled on the importance of maintaining healthy lifestyle habits, including balanced nutrition, regular physical activity, and behavioral modifications,  Medication is an adjunct to, not a replacement for, lifestyle changes and that the long-term success and weight maintenance depend on continued adherence to  these strategies.   Joanna Osborn was informed of the importance of frequent follow up visits to maximize her success with intensive lifestyle modifications for her obesity and obesity related health conditions as recommended by USPSTF and CMS guidelines   Louann Penton, MD   "

## 2024-08-01 NOTE — Addendum Note (Signed)
 Addended by: LAFE BAKER CROME on: 08/01/2024 07:11 AM   Modules accepted: Orders

## 2024-09-06 ENCOUNTER — Ambulatory Visit (INDEPENDENT_AMBULATORY_CARE_PROVIDER_SITE_OTHER): Admitting: Family Medicine

## 2024-10-02 ENCOUNTER — Ambulatory Visit: Attending: General Surgery

## 2024-10-02 ENCOUNTER — Ambulatory Visit (INDEPENDENT_AMBULATORY_CARE_PROVIDER_SITE_OTHER): Admitting: Family Medicine

## 2024-10-03 ENCOUNTER — Ambulatory Visit (INDEPENDENT_AMBULATORY_CARE_PROVIDER_SITE_OTHER): Admitting: Family Medicine

## 2024-11-29 ENCOUNTER — Inpatient Hospital Stay

## 2024-11-29 ENCOUNTER — Inpatient Hospital Stay: Admitting: Hematology
